# Patient Record
Sex: Male | Born: 1935 | Race: White | Hispanic: No | Marital: Married | Smoking: Former smoker
Health system: Southern US, Community
[De-identification: ages and names within clinical notes are randomized; demographics above are authoritative.]

## PROBLEM LIST (undated history)

## (undated) DIAGNOSIS — M549 Dorsalgia, unspecified: Secondary | ICD-10-CM

## (undated) DIAGNOSIS — R05 Cough: Secondary | ICD-10-CM

## (undated) DIAGNOSIS — K259 Gastric ulcer, unspecified as acute or chronic, without hemorrhage or perforation: Secondary | ICD-10-CM

## (undated) DIAGNOSIS — E785 Hyperlipidemia, unspecified: Secondary | ICD-10-CM

## (undated) DIAGNOSIS — R32 Unspecified urinary incontinence: Secondary | ICD-10-CM

## (undated) DIAGNOSIS — K5900 Constipation, unspecified: Secondary | ICD-10-CM

## (undated) DIAGNOSIS — R0602 Shortness of breath: Secondary | ICD-10-CM

## (undated) DIAGNOSIS — I10 Essential (primary) hypertension: Secondary | ICD-10-CM

## (undated) DIAGNOSIS — K219 Gastro-esophageal reflux disease without esophagitis: Secondary | ICD-10-CM

## (undated) DIAGNOSIS — G8929 Other chronic pain: Secondary | ICD-10-CM

## (undated) DIAGNOSIS — F0390 Unspecified dementia without behavioral disturbance: Secondary | ICD-10-CM

## (undated) DIAGNOSIS — E876 Hypokalemia: Secondary | ICD-10-CM

## (undated) DIAGNOSIS — K5792 Diverticulitis of intestine, part unspecified, without perforation or abscess without bleeding: Secondary | ICD-10-CM

## (undated) DIAGNOSIS — I2510 Atherosclerotic heart disease of native coronary artery without angina pectoris: Secondary | ICD-10-CM

## (undated) DIAGNOSIS — C801 Malignant (primary) neoplasm, unspecified: Secondary | ICD-10-CM

## (undated) DIAGNOSIS — N2889 Other specified disorders of kidney and ureter: Secondary | ICD-10-CM

## (undated) DIAGNOSIS — I4891 Unspecified atrial fibrillation: Secondary | ICD-10-CM

## (undated) DIAGNOSIS — M1990 Unspecified osteoarthritis, unspecified site: Secondary | ICD-10-CM

## (undated) DIAGNOSIS — R7301 Impaired fasting glucose: Secondary | ICD-10-CM

## (undated) DIAGNOSIS — N400 Enlarged prostate without lower urinary tract symptoms: Secondary | ICD-10-CM

## (undated) DIAGNOSIS — Z952 Presence of prosthetic heart valve: Secondary | ICD-10-CM

## (undated) DIAGNOSIS — N289 Disorder of kidney and ureter, unspecified: Secondary | ICD-10-CM

## (undated) HISTORY — DX: Hypokalemia: E87.6

## (undated) HISTORY — PX: OTHER SURGICAL HISTORY: SHX169

## (undated) HISTORY — PX: CORONARY ARTERY BYPASS GRAFT: SHX141

## (undated) HISTORY — DX: Unspecified atrial fibrillation: I48.91

## (undated) HISTORY — DX: Hyperlipidemia, unspecified: E78.5

## (undated) HISTORY — DX: Other specified disorders of kidney and ureter: N28.89

## (undated) HISTORY — PX: KNEE SURGERY: SHX244

## (undated) HISTORY — PX: RADIOFREQUENCY ABLATION KIDNEY: SHX2292

## (undated) HISTORY — DX: Impaired fasting glucose: R73.01

## (undated) HISTORY — DX: Unspecified urinary incontinence: R32

## (undated) HISTORY — DX: Disorder of kidney and ureter, unspecified: N28.9

## (undated) HISTORY — DX: Atherosclerotic heart disease of native coronary artery without angina pectoris: I25.10

## (undated) HISTORY — DX: Essential (primary) hypertension: I10

## (undated) HISTORY — DX: Diverticulitis of intestine, part unspecified, without perforation or abscess without bleeding: K57.92

---

## 2000-06-07 ENCOUNTER — Ambulatory Visit (HOSPITAL_COMMUNITY): Admission: RE | Admit: 2000-06-07 | Discharge: 2000-06-07 | Payer: Self-pay | Admitting: Gastroenterology

## 2000-06-07 ENCOUNTER — Encounter (INDEPENDENT_AMBULATORY_CARE_PROVIDER_SITE_OTHER): Payer: Self-pay | Admitting: Specialist

## 2002-06-04 ENCOUNTER — Ambulatory Visit (HOSPITAL_COMMUNITY): Admission: RE | Admit: 2002-06-04 | Discharge: 2002-06-04 | Payer: Self-pay | Admitting: Cardiovascular Disease

## 2002-06-12 ENCOUNTER — Encounter: Payer: Self-pay | Admitting: Thoracic Surgery (Cardiothoracic Vascular Surgery)

## 2002-06-16 ENCOUNTER — Encounter (INDEPENDENT_AMBULATORY_CARE_PROVIDER_SITE_OTHER): Payer: Self-pay | Admitting: *Deleted

## 2002-06-16 ENCOUNTER — Encounter: Payer: Self-pay | Admitting: Thoracic Surgery (Cardiothoracic Vascular Surgery)

## 2002-06-16 ENCOUNTER — Inpatient Hospital Stay (HOSPITAL_COMMUNITY)
Admission: RE | Admit: 2002-06-16 | Discharge: 2002-06-24 | Payer: Self-pay | Admitting: Thoracic Surgery (Cardiothoracic Vascular Surgery)

## 2002-06-16 HISTORY — PX: AORTIC VALVE REPLACEMENT: SHX41

## 2002-06-17 ENCOUNTER — Encounter: Payer: Self-pay | Admitting: Thoracic Surgery (Cardiothoracic Vascular Surgery)

## 2002-06-18 ENCOUNTER — Encounter: Payer: Self-pay | Admitting: Thoracic Surgery (Cardiothoracic Vascular Surgery)

## 2002-07-09 ENCOUNTER — Ambulatory Visit (HOSPITAL_COMMUNITY): Admission: RE | Admit: 2002-07-09 | Discharge: 2002-07-09 | Payer: Self-pay | Admitting: Cardiovascular Disease

## 2002-07-20 ENCOUNTER — Encounter (HOSPITAL_COMMUNITY): Admission: RE | Admit: 2002-07-20 | Discharge: 2002-10-18 | Payer: Self-pay | Admitting: Cardiovascular Disease

## 2002-10-19 ENCOUNTER — Encounter (HOSPITAL_COMMUNITY): Admission: RE | Admit: 2002-10-19 | Discharge: 2003-01-17 | Payer: Self-pay | Admitting: Cardiovascular Disease

## 2004-07-07 ENCOUNTER — Ambulatory Visit: Payer: Self-pay

## 2004-08-04 ENCOUNTER — Ambulatory Visit: Payer: Self-pay | Admitting: Internal Medicine

## 2004-08-14 ENCOUNTER — Ambulatory Visit: Payer: Self-pay | Admitting: Cardiovascular Disease

## 2004-09-01 ENCOUNTER — Ambulatory Visit: Payer: Self-pay | Admitting: Cardiology

## 2004-09-05 ENCOUNTER — Ambulatory Visit: Payer: Self-pay

## 2004-09-15 ENCOUNTER — Ambulatory Visit: Payer: Self-pay | Admitting: Cardiovascular Disease

## 2004-09-28 ENCOUNTER — Ambulatory Visit: Payer: Self-pay | Admitting: Cardiology

## 2004-10-16 ENCOUNTER — Ambulatory Visit: Payer: Self-pay | Admitting: *Deleted

## 2004-11-10 ENCOUNTER — Ambulatory Visit: Payer: Self-pay | Admitting: Cardiology

## 2004-12-06 ENCOUNTER — Ambulatory Visit: Payer: Self-pay | Admitting: Internal Medicine

## 2005-01-03 ENCOUNTER — Ambulatory Visit: Payer: Self-pay | Admitting: Cardiology

## 2005-02-02 ENCOUNTER — Ambulatory Visit: Payer: Self-pay | Admitting: Cardiology

## 2005-02-20 ENCOUNTER — Ambulatory Visit: Payer: Self-pay | Admitting: Cardiovascular Disease

## 2005-03-02 ENCOUNTER — Ambulatory Visit: Payer: Self-pay | Admitting: Cardiology

## 2005-03-30 ENCOUNTER — Ambulatory Visit: Payer: Self-pay | Admitting: Cardiology

## 2005-05-01 ENCOUNTER — Ambulatory Visit: Payer: Self-pay | Admitting: Cardiology

## 2005-05-29 ENCOUNTER — Ambulatory Visit: Payer: Self-pay | Admitting: Internal Medicine

## 2005-06-25 ENCOUNTER — Ambulatory Visit: Payer: Self-pay | Admitting: Internal Medicine

## 2005-07-03 ENCOUNTER — Ambulatory Visit: Payer: Self-pay | Admitting: Cardiology

## 2005-07-17 ENCOUNTER — Ambulatory Visit: Payer: Self-pay | Admitting: Cardiology

## 2005-08-07 ENCOUNTER — Ambulatory Visit: Payer: Self-pay | Admitting: Cardiology

## 2005-08-14 ENCOUNTER — Ambulatory Visit: Payer: Self-pay | Admitting: Cardiovascular Disease

## 2005-08-30 ENCOUNTER — Ambulatory Visit: Payer: Self-pay | Admitting: Cardiovascular Disease

## 2005-08-30 ENCOUNTER — Ambulatory Visit (HOSPITAL_COMMUNITY): Admission: RE | Admit: 2005-08-30 | Discharge: 2005-08-30 | Payer: Self-pay | Admitting: Cardiovascular Disease

## 2005-09-04 ENCOUNTER — Ambulatory Visit: Payer: Self-pay | Admitting: Cardiology

## 2005-10-02 ENCOUNTER — Ambulatory Visit: Payer: Self-pay | Admitting: Cardiology

## 2005-10-18 ENCOUNTER — Ambulatory Visit: Payer: Self-pay | Admitting: Cardiovascular Disease

## 2005-10-30 ENCOUNTER — Ambulatory Visit: Payer: Self-pay | Admitting: *Deleted

## 2005-11-19 ENCOUNTER — Ambulatory Visit: Payer: Self-pay | Admitting: Cardiology

## 2005-11-27 ENCOUNTER — Ambulatory Visit: Payer: Self-pay | Admitting: Cardiology

## 2005-12-17 ENCOUNTER — Ambulatory Visit: Payer: Self-pay | Admitting: Cardiovascular Disease

## 2005-12-25 ENCOUNTER — Ambulatory Visit: Payer: Self-pay | Admitting: Cardiology

## 2006-01-22 ENCOUNTER — Ambulatory Visit: Payer: Self-pay | Admitting: Internal Medicine

## 2006-02-19 ENCOUNTER — Ambulatory Visit: Payer: Self-pay | Admitting: Cardiovascular Disease

## 2006-03-19 ENCOUNTER — Ambulatory Visit: Payer: Self-pay | Admitting: *Deleted

## 2006-04-16 ENCOUNTER — Ambulatory Visit: Payer: Self-pay | Admitting: Cardiology

## 2006-05-14 ENCOUNTER — Ambulatory Visit: Payer: Self-pay | Admitting: Cardiology

## 2006-06-04 ENCOUNTER — Ambulatory Visit: Payer: Self-pay | Admitting: Cardiovascular Disease

## 2006-06-11 ENCOUNTER — Ambulatory Visit: Payer: Self-pay | Admitting: Cardiology

## 2006-07-05 ENCOUNTER — Ambulatory Visit: Payer: Self-pay | Admitting: Cardiovascular Disease

## 2006-07-09 ENCOUNTER — Ambulatory Visit: Payer: Self-pay | Admitting: *Deleted

## 2006-07-30 ENCOUNTER — Ambulatory Visit: Payer: Self-pay | Admitting: Cardiology

## 2006-08-26 ENCOUNTER — Ambulatory Visit: Payer: Self-pay | Admitting: Cardiovascular Disease

## 2006-08-28 ENCOUNTER — Ambulatory Visit: Payer: Self-pay | Admitting: *Deleted

## 2006-09-18 ENCOUNTER — Ambulatory Visit: Payer: Self-pay | Admitting: Cardiology

## 2006-10-08 ENCOUNTER — Ambulatory Visit: Payer: Self-pay | Admitting: Cardiovascular Disease

## 2006-10-14 ENCOUNTER — Ambulatory Visit: Payer: Self-pay | Admitting: Internal Medicine

## 2006-10-24 ENCOUNTER — Ambulatory Visit: Payer: Self-pay | Admitting: *Deleted

## 2006-11-05 ENCOUNTER — Ambulatory Visit: Payer: Self-pay | Admitting: Cardiovascular Disease

## 2006-11-07 ENCOUNTER — Ambulatory Visit: Payer: Self-pay | Admitting: Cardiology

## 2006-12-05 ENCOUNTER — Ambulatory Visit: Payer: Self-pay | Admitting: Cardiology

## 2006-12-19 ENCOUNTER — Ambulatory Visit (HOSPITAL_COMMUNITY): Admission: RE | Admit: 2006-12-19 | Discharge: 2006-12-19 | Payer: Self-pay | Admitting: Cardiovascular Disease

## 2006-12-19 ENCOUNTER — Ambulatory Visit: Payer: Self-pay | Admitting: Cardiology

## 2006-12-19 ENCOUNTER — Ambulatory Visit: Payer: Self-pay | Admitting: Cardiovascular Disease

## 2007-01-06 ENCOUNTER — Ambulatory Visit: Payer: Self-pay | Admitting: Internal Medicine

## 2007-01-13 ENCOUNTER — Ambulatory Visit: Payer: Self-pay | Admitting: Cardiovascular Disease

## 2007-02-03 ENCOUNTER — Ambulatory Visit: Payer: Self-pay | Admitting: Internal Medicine

## 2007-03-03 ENCOUNTER — Ambulatory Visit: Payer: Self-pay | Admitting: Cardiovascular Disease

## 2007-03-31 ENCOUNTER — Ambulatory Visit: Payer: Self-pay | Admitting: Internal Medicine

## 2007-04-11 ENCOUNTER — Inpatient Hospital Stay (HOSPITAL_COMMUNITY): Admission: EM | Admit: 2007-04-11 | Discharge: 2007-04-13 | Payer: Self-pay | Admitting: Podiatry

## 2007-04-15 ENCOUNTER — Ambulatory Visit: Payer: Self-pay | Admitting: Cardiology

## 2007-04-30 ENCOUNTER — Ambulatory Visit: Payer: Self-pay | Admitting: Internal Medicine

## 2007-05-20 ENCOUNTER — Ambulatory Visit: Payer: Self-pay | Admitting: Cardiology

## 2007-06-02 ENCOUNTER — Ambulatory Visit: Payer: Self-pay | Admitting: Internal Medicine

## 2007-06-30 ENCOUNTER — Ambulatory Visit: Payer: Self-pay | Admitting: Cardiovascular Disease

## 2007-07-07 ENCOUNTER — Ambulatory Visit: Payer: Self-pay | Admitting: Cardiovascular Disease

## 2007-07-29 ENCOUNTER — Ambulatory Visit: Payer: Self-pay | Admitting: Cardiology

## 2007-09-05 ENCOUNTER — Ambulatory Visit: Payer: Self-pay | Admitting: Cardiology

## 2007-09-19 ENCOUNTER — Ambulatory Visit: Payer: Self-pay | Admitting: Internal Medicine

## 2007-10-07 ENCOUNTER — Ambulatory Visit: Payer: Self-pay | Admitting: Cardiovascular Disease

## 2007-10-09 ENCOUNTER — Encounter: Payer: Self-pay | Admitting: Cardiovascular Disease

## 2007-10-09 ENCOUNTER — Ambulatory Visit: Payer: Self-pay

## 2007-10-17 ENCOUNTER — Ambulatory Visit: Payer: Self-pay | Admitting: Cardiology

## 2007-11-17 ENCOUNTER — Ambulatory Visit: Payer: Self-pay | Admitting: Cardiology

## 2007-12-15 ENCOUNTER — Ambulatory Visit: Payer: Self-pay | Admitting: Cardiology

## 2008-01-13 ENCOUNTER — Ambulatory Visit: Payer: Self-pay | Admitting: Cardiology

## 2008-02-03 ENCOUNTER — Ambulatory Visit: Payer: Self-pay | Admitting: Internal Medicine

## 2008-03-02 ENCOUNTER — Ambulatory Visit: Payer: Self-pay | Admitting: Cardiovascular Disease

## 2008-04-02 ENCOUNTER — Ambulatory Visit: Payer: Self-pay | Admitting: Cardiology

## 2008-04-14 ENCOUNTER — Ambulatory Visit: Payer: Self-pay | Admitting: Cardiovascular Disease

## 2008-04-30 ENCOUNTER — Ambulatory Visit: Payer: Self-pay | Admitting: Cardiovascular Disease

## 2008-05-28 ENCOUNTER — Ambulatory Visit: Payer: Self-pay | Admitting: Cardiovascular Disease

## 2008-06-25 ENCOUNTER — Ambulatory Visit: Payer: Self-pay | Admitting: Internal Medicine

## 2008-07-01 ENCOUNTER — Ambulatory Visit: Payer: Self-pay | Admitting: Cardiovascular Disease

## 2008-07-26 ENCOUNTER — Ambulatory Visit: Payer: Self-pay | Admitting: Cardiovascular Disease

## 2008-08-23 ENCOUNTER — Ambulatory Visit: Payer: Self-pay | Admitting: Internal Medicine

## 2008-08-23 ENCOUNTER — Inpatient Hospital Stay (HOSPITAL_COMMUNITY): Admission: EM | Admit: 2008-08-23 | Discharge: 2008-09-03 | Payer: Self-pay | Admitting: Emergency Medicine

## 2008-08-24 ENCOUNTER — Encounter (INDEPENDENT_AMBULATORY_CARE_PROVIDER_SITE_OTHER): Payer: Self-pay | Admitting: Internal Medicine

## 2008-08-30 ENCOUNTER — Encounter (INDEPENDENT_AMBULATORY_CARE_PROVIDER_SITE_OTHER): Payer: Self-pay | Admitting: Interventional Radiology

## 2008-09-02 ENCOUNTER — Ambulatory Visit: Payer: Self-pay | Admitting: Physical Medicine & Rehabilitation

## 2008-09-03 ENCOUNTER — Ambulatory Visit: Payer: Self-pay | Admitting: Physical Medicine & Rehabilitation

## 2008-09-03 ENCOUNTER — Inpatient Hospital Stay (HOSPITAL_COMMUNITY)
Admission: RE | Admit: 2008-09-03 | Discharge: 2008-09-08 | Payer: Self-pay | Admitting: Physical Medicine & Rehabilitation

## 2008-09-30 ENCOUNTER — Ambulatory Visit: Payer: Self-pay | Admitting: Internal Medicine

## 2008-10-11 ENCOUNTER — Ambulatory Visit: Payer: Self-pay | Admitting: Internal Medicine

## 2008-10-15 ENCOUNTER — Ambulatory Visit: Payer: Self-pay | Admitting: Cardiovascular Disease

## 2008-10-22 ENCOUNTER — Ambulatory Visit: Payer: Self-pay

## 2008-10-25 ENCOUNTER — Ambulatory Visit: Payer: Self-pay | Admitting: Cardiology

## 2008-11-22 ENCOUNTER — Ambulatory Visit: Payer: Self-pay | Admitting: Cardiology

## 2008-12-21 ENCOUNTER — Ambulatory Visit: Payer: Self-pay | Admitting: Cardiology

## 2009-01-03 ENCOUNTER — Inpatient Hospital Stay (HOSPITAL_COMMUNITY): Admission: EM | Admit: 2009-01-03 | Discharge: 2009-01-04 | Payer: Self-pay | Admitting: Emergency Medicine

## 2009-01-03 ENCOUNTER — Telehealth: Payer: Self-pay | Admitting: Cardiovascular Disease

## 2009-01-11 ENCOUNTER — Encounter: Payer: Self-pay | Admitting: Cardiovascular Disease

## 2009-01-12 ENCOUNTER — Ambulatory Visit: Payer: Self-pay | Admitting: Cardiology

## 2009-01-19 ENCOUNTER — Ambulatory Visit (HOSPITAL_COMMUNITY): Admission: RE | Admit: 2009-01-19 | Discharge: 2009-01-19 | Payer: Self-pay | Admitting: Urology

## 2009-01-21 DIAGNOSIS — Z952 Presence of prosthetic heart valve: Secondary | ICD-10-CM | POA: Insufficient documentation

## 2009-01-21 DIAGNOSIS — R4182 Altered mental status, unspecified: Secondary | ICD-10-CM | POA: Insufficient documentation

## 2009-01-21 DIAGNOSIS — E785 Hyperlipidemia, unspecified: Secondary | ICD-10-CM | POA: Insufficient documentation

## 2009-01-21 DIAGNOSIS — I4891 Unspecified atrial fibrillation: Secondary | ICD-10-CM

## 2009-01-21 DIAGNOSIS — I10 Essential (primary) hypertension: Secondary | ICD-10-CM

## 2009-01-21 DIAGNOSIS — E876 Hypokalemia: Secondary | ICD-10-CM

## 2009-01-21 DIAGNOSIS — I2510 Atherosclerotic heart disease of native coronary artery without angina pectoris: Secondary | ICD-10-CM | POA: Insufficient documentation

## 2009-01-26 ENCOUNTER — Encounter: Payer: Self-pay | Admitting: *Deleted

## 2009-01-26 ENCOUNTER — Ambulatory Visit: Payer: Self-pay | Admitting: Cardiovascular Disease

## 2009-01-27 ENCOUNTER — Ambulatory Visit: Payer: Self-pay | Admitting: Internal Medicine

## 2009-01-27 LAB — CONVERTED CEMR LAB
POC INR: 3.1
Protime: 21.1

## 2009-02-24 ENCOUNTER — Ambulatory Visit: Payer: Self-pay | Admitting: Cardiology

## 2009-02-24 LAB — CONVERTED CEMR LAB
POC INR: 2
Prothrombin Time: 17.5 s

## 2009-03-02 ENCOUNTER — Encounter: Payer: Self-pay | Admitting: *Deleted

## 2009-03-02 ENCOUNTER — Encounter: Admission: RE | Admit: 2009-03-02 | Discharge: 2009-03-02 | Payer: Self-pay | Admitting: Urology

## 2009-03-04 ENCOUNTER — Telehealth (INDEPENDENT_AMBULATORY_CARE_PROVIDER_SITE_OTHER): Payer: Self-pay | Admitting: *Deleted

## 2009-03-10 ENCOUNTER — Encounter: Admission: RE | Admit: 2009-03-10 | Discharge: 2009-03-10 | Payer: Self-pay | Admitting: Orthopedic Surgery

## 2009-03-11 ENCOUNTER — Telehealth: Payer: Self-pay | Admitting: Cardiovascular Disease

## 2009-03-23 ENCOUNTER — Encounter: Payer: Self-pay | Admitting: Cardiovascular Disease

## 2009-03-24 ENCOUNTER — Ambulatory Visit: Payer: Self-pay | Admitting: Cardiovascular Disease

## 2009-03-24 LAB — CONVERTED CEMR LAB: Prothrombin Time: 15.3 s

## 2009-03-25 ENCOUNTER — Telehealth: Payer: Self-pay | Admitting: Cardiovascular Disease

## 2009-03-31 ENCOUNTER — Encounter (INDEPENDENT_AMBULATORY_CARE_PROVIDER_SITE_OTHER): Payer: Self-pay | Admitting: Cardiology

## 2009-03-31 ENCOUNTER — Ambulatory Visit: Payer: Self-pay | Admitting: Cardiology

## 2009-04-04 ENCOUNTER — Ambulatory Visit (HOSPITAL_COMMUNITY): Admission: RE | Admit: 2009-04-04 | Discharge: 2009-04-05 | Payer: Self-pay | Admitting: Interventional Radiology

## 2009-04-04 ENCOUNTER — Encounter (INDEPENDENT_AMBULATORY_CARE_PROVIDER_SITE_OTHER): Payer: Self-pay | Admitting: Interventional Radiology

## 2009-04-05 ENCOUNTER — Telehealth (INDEPENDENT_AMBULATORY_CARE_PROVIDER_SITE_OTHER): Payer: Self-pay | Admitting: Cardiology

## 2009-04-11 ENCOUNTER — Telehealth: Payer: Self-pay | Admitting: Cardiovascular Disease

## 2009-04-14 ENCOUNTER — Encounter (INDEPENDENT_AMBULATORY_CARE_PROVIDER_SITE_OTHER): Payer: Self-pay | Admitting: Cardiology

## 2009-04-14 ENCOUNTER — Ambulatory Visit: Payer: Self-pay | Admitting: Cardiology

## 2009-04-14 LAB — CONVERTED CEMR LAB: POC INR: 1

## 2009-04-18 ENCOUNTER — Ambulatory Visit: Payer: Self-pay | Admitting: Cardiology

## 2009-04-18 ENCOUNTER — Encounter (INDEPENDENT_AMBULATORY_CARE_PROVIDER_SITE_OTHER): Payer: Self-pay | Admitting: Cardiology

## 2009-04-25 ENCOUNTER — Ambulatory Visit: Payer: Self-pay | Admitting: Cardiovascular Disease

## 2009-04-25 ENCOUNTER — Telehealth (INDEPENDENT_AMBULATORY_CARE_PROVIDER_SITE_OTHER): Payer: Self-pay | Admitting: *Deleted

## 2009-05-03 ENCOUNTER — Encounter: Admission: RE | Admit: 2009-05-03 | Discharge: 2009-05-03 | Payer: Self-pay | Admitting: Interventional Radiology

## 2009-05-03 ENCOUNTER — Encounter: Payer: Self-pay | Admitting: Cardiovascular Disease

## 2009-05-04 ENCOUNTER — Ambulatory Visit: Payer: Self-pay | Admitting: Cardiology

## 2009-05-04 LAB — CONVERTED CEMR LAB: POC INR: 1.9

## 2009-05-19 ENCOUNTER — Ambulatory Visit: Payer: Self-pay | Admitting: Internal Medicine

## 2009-05-19 LAB — CONVERTED CEMR LAB: POC INR: 2.1

## 2009-06-16 ENCOUNTER — Ambulatory Visit: Payer: Self-pay | Admitting: Cardiovascular Disease

## 2009-06-16 LAB — CONVERTED CEMR LAB
INR: 1.6
POC INR: 1.6

## 2009-06-30 ENCOUNTER — Ambulatory Visit: Payer: Self-pay | Admitting: Internal Medicine

## 2009-06-30 LAB — CONVERTED CEMR LAB: POC INR: 1.8

## 2009-07-14 ENCOUNTER — Ambulatory Visit: Payer: Self-pay | Admitting: Cardiology

## 2009-08-05 ENCOUNTER — Ambulatory Visit: Payer: Self-pay | Admitting: Internal Medicine

## 2009-08-05 ENCOUNTER — Ambulatory Visit: Payer: Self-pay | Admitting: Cardiovascular Disease

## 2009-09-02 ENCOUNTER — Ambulatory Visit: Payer: Self-pay | Admitting: Cardiology

## 2009-09-02 LAB — CONVERTED CEMR LAB: POC INR: 2.7

## 2009-09-30 ENCOUNTER — Ambulatory Visit: Payer: Self-pay | Admitting: Internal Medicine

## 2009-09-30 LAB — CONVERTED CEMR LAB: POC INR: 2.5

## 2009-10-28 ENCOUNTER — Ambulatory Visit: Payer: Self-pay | Admitting: Cardiology

## 2009-11-01 ENCOUNTER — Encounter: Admission: RE | Admit: 2009-11-01 | Discharge: 2009-11-01 | Payer: Self-pay | Admitting: Interventional Radiology

## 2009-11-01 ENCOUNTER — Ambulatory Visit (HOSPITAL_COMMUNITY): Admission: RE | Admit: 2009-11-01 | Discharge: 2009-11-01 | Payer: Self-pay | Admitting: Interventional Radiology

## 2009-12-02 ENCOUNTER — Ambulatory Visit: Payer: Self-pay | Admitting: Cardiology

## 2009-12-30 ENCOUNTER — Ambulatory Visit: Payer: Self-pay | Admitting: Internal Medicine

## 2009-12-30 LAB — CONVERTED CEMR LAB: POC INR: 2.1

## 2010-01-27 ENCOUNTER — Encounter: Payer: Self-pay | Admitting: Cardiovascular Disease

## 2010-01-27 ENCOUNTER — Ambulatory Visit (HOSPITAL_COMMUNITY): Admission: RE | Admit: 2010-01-27 | Discharge: 2010-01-27 | Payer: Self-pay | Admitting: Interventional Radiology

## 2010-01-30 ENCOUNTER — Ambulatory Visit: Payer: Self-pay | Admitting: Cardiology

## 2010-01-30 LAB — CONVERTED CEMR LAB: POC INR: 1.5

## 2010-02-21 ENCOUNTER — Ambulatory Visit: Payer: Self-pay | Admitting: Internal Medicine

## 2010-03-15 ENCOUNTER — Ambulatory Visit: Payer: Self-pay | Admitting: Cardiology

## 2010-04-05 ENCOUNTER — Ambulatory Visit: Payer: Self-pay | Admitting: Cardiology

## 2010-04-05 LAB — CONVERTED CEMR LAB: POC INR: 2.2

## 2010-04-12 ENCOUNTER — Encounter: Admission: RE | Admit: 2010-04-12 | Discharge: 2010-04-12 | Payer: Self-pay | Admitting: Interventional Radiology

## 2010-04-12 ENCOUNTER — Ambulatory Visit (HOSPITAL_COMMUNITY): Admission: RE | Admit: 2010-04-12 | Discharge: 2010-04-12 | Payer: Self-pay | Admitting: Interventional Radiology

## 2010-04-26 ENCOUNTER — Ambulatory Visit: Payer: Self-pay | Admitting: Cardiovascular Disease

## 2010-04-26 DIAGNOSIS — R0602 Shortness of breath: Secondary | ICD-10-CM | POA: Insufficient documentation

## 2010-04-26 DIAGNOSIS — R079 Chest pain, unspecified: Secondary | ICD-10-CM

## 2010-04-26 DIAGNOSIS — R609 Edema, unspecified: Secondary | ICD-10-CM | POA: Insufficient documentation

## 2010-05-02 ENCOUNTER — Ambulatory Visit: Payer: Self-pay | Admitting: Cardiovascular Disease

## 2010-05-02 LAB — CONVERTED CEMR LAB: POC INR: 2.3

## 2010-05-16 ENCOUNTER — Telehealth (INDEPENDENT_AMBULATORY_CARE_PROVIDER_SITE_OTHER): Payer: Self-pay | Admitting: *Deleted

## 2010-05-17 ENCOUNTER — Encounter: Payer: Self-pay | Admitting: Cardiology

## 2010-05-17 ENCOUNTER — Ambulatory Visit: Payer: Self-pay | Admitting: Cardiology

## 2010-05-17 ENCOUNTER — Ambulatory Visit (HOSPITAL_COMMUNITY): Admission: RE | Admit: 2010-05-17 | Discharge: 2010-05-17 | Payer: Self-pay | Admitting: Cardiovascular Disease

## 2010-05-17 ENCOUNTER — Encounter (HOSPITAL_COMMUNITY)
Admission: RE | Admit: 2010-05-17 | Discharge: 2010-07-28 | Payer: Self-pay | Source: Home / Self Care | Admitting: Cardiovascular Disease

## 2010-05-17 ENCOUNTER — Encounter: Payer: Self-pay | Admitting: Cardiovascular Disease

## 2010-05-17 ENCOUNTER — Encounter (INDEPENDENT_AMBULATORY_CARE_PROVIDER_SITE_OTHER): Payer: Self-pay

## 2010-05-17 ENCOUNTER — Ambulatory Visit: Payer: Self-pay

## 2010-05-24 ENCOUNTER — Ambulatory Visit: Payer: Self-pay | Admitting: Internal Medicine

## 2010-05-29 ENCOUNTER — Ambulatory Visit: Payer: Self-pay | Admitting: Internal Medicine

## 2010-05-29 LAB — CONVERTED CEMR LAB: POC INR: 2.5

## 2010-06-05 ENCOUNTER — Ambulatory Visit: Payer: Self-pay | Admitting: Cardiology

## 2010-06-05 LAB — CONVERTED CEMR LAB: POC INR: 2.7

## 2010-06-12 ENCOUNTER — Ambulatory Visit: Payer: Self-pay | Admitting: Cardiology

## 2010-06-12 ENCOUNTER — Encounter: Payer: Self-pay | Admitting: Internal Medicine

## 2010-06-12 LAB — CONVERTED CEMR LAB
Basophils Absolute: 0 10*3/uL (ref 0.0–0.1)
Eosinophils Absolute: 0.2 10*3/uL (ref 0.0–0.7)
Eosinophils Relative: 2.4 % (ref 0.0–5.0)
GFR calc non Af Amer: 72.51 mL/min (ref >60)
INR: 3 — ABNORMAL HIGH (ref 0.8–1.0)
MCHC: 34.1 g/dL (ref 30.0–36.0)
MCV: 88.8 fL (ref 78.0–100.0)
Magnesium: 2 mg/dL (ref 1.5–2.5)
Monocytes Absolute: 0.5 10*3/uL (ref 0.1–1.0)
Neutrophils Relative %: 59.1 % (ref 43.0–77.0)
Platelets: 199 10*3/uL (ref 150.0–400.0)
Potassium: 4.2 meq/L (ref 3.5–5.1)
Prothrombin Time: 31.9 s — ABNORMAL HIGH (ref 9.7–11.8)
Sodium: 140 meq/L (ref 135–145)
WBC: 7.1 10*3/uL (ref 4.5–10.5)
aPTT: 35.8 s — ABNORMAL HIGH (ref 21.7–28.8)

## 2010-06-14 ENCOUNTER — Inpatient Hospital Stay (HOSPITAL_COMMUNITY): Admission: AD | Admit: 2010-06-14 | Discharge: 2010-06-18 | Payer: Self-pay | Admitting: Internal Medicine

## 2010-06-14 ENCOUNTER — Ambulatory Visit: Payer: Self-pay | Admitting: Internal Medicine

## 2010-06-22 ENCOUNTER — Telehealth: Payer: Self-pay | Admitting: Internal Medicine

## 2010-06-23 ENCOUNTER — Ambulatory Visit: Payer: Self-pay | Admitting: Cardiovascular Disease

## 2010-06-28 ENCOUNTER — Ambulatory Visit: Payer: Self-pay | Admitting: Internal Medicine

## 2010-06-28 ENCOUNTER — Ambulatory Visit: Payer: Self-pay | Admitting: Cardiology

## 2010-06-30 ENCOUNTER — Telehealth: Payer: Self-pay | Admitting: Internal Medicine

## 2010-08-01 ENCOUNTER — Ambulatory Visit: Payer: Self-pay | Admitting: Cardiovascular Disease

## 2010-08-01 ENCOUNTER — Ambulatory Visit: Payer: Self-pay | Admitting: Internal Medicine

## 2010-08-27 DIAGNOSIS — R7301 Impaired fasting glucose: Secondary | ICD-10-CM

## 2010-08-27 HISTORY — DX: Impaired fasting glucose: R73.01

## 2010-08-29 ENCOUNTER — Ambulatory Visit: Admission: RE | Admit: 2010-08-29 | Discharge: 2010-08-29 | Payer: Self-pay | Source: Home / Self Care

## 2010-09-11 ENCOUNTER — Telehealth (INDEPENDENT_AMBULATORY_CARE_PROVIDER_SITE_OTHER): Payer: Self-pay | Admitting: *Deleted

## 2010-09-17 ENCOUNTER — Encounter: Payer: Self-pay | Admitting: Cardiovascular Disease

## 2010-09-18 ENCOUNTER — Encounter: Payer: Self-pay | Admitting: Urology

## 2010-09-24 LAB — CONVERTED CEMR LAB
Basophils Relative: 0.3 % (ref 0.0–3.0)
Chloride: 107 meq/L (ref 96–112)
Eosinophils Relative: 2.9 % (ref 0.0–5.0)
HCT: 39.8 % (ref 39.0–52.0)
Hemoglobin: 13.7 g/dL (ref 13.0–17.0)
INR: 1.9 — ABNORMAL HIGH (ref 0.8–1.0)
MCV: 88.9 fL (ref 78.0–100.0)
Monocytes Absolute: 0.2 10*3/uL (ref 0.1–1.0)
Neutrophils Relative %: 59.1 % (ref 43.0–77.0)
Potassium: 3.9 meq/L (ref 3.5–5.1)
RBC: 4.48 M/uL (ref 4.22–5.81)
Sodium: 139 meq/L (ref 135–145)
WBC: 6.2 10*3/uL (ref 4.5–10.5)

## 2010-09-26 ENCOUNTER — Ambulatory Visit: Admission: RE | Admit: 2010-09-26 | Discharge: 2010-09-26 | Payer: Self-pay | Source: Home / Self Care

## 2010-09-26 LAB — CONVERTED CEMR LAB: POC INR: 2

## 2010-09-26 NOTE — Progress Notes (Signed)
Summary: talk to nurse  Phone Note Call from Patient   Caller: Daughter Summary of Call: pt daughter needs to speak with nurse concerning her fathers medications. Pharamacy told her that there was going to be an interaction with the meds. Pt is not taking any of his meds because of this. [phone] Initial call taken by: Edman Circle,  June 30, 2010 9:16 AM  Follow-up for Phone Call        pt dtr calling back-got disconnected-pls call [phone] Glynda Jaeger  June 30, 2010 9:55 AM  spoke w/daughter she reports pharmacy alerted them to interaction b/t torsemide and digoxin and they were nervous to take it, discussed w/Sarah phD and explained can increase dig level so we will just have to check that ina month or two but we use these meds together frequently, have called [facility] and made them aware ok to fill med, they ask if they can change his torsemide 20mg  to 10mg  2 tabs daily b/c it would save him a lot of money advised that was ok just make sure pt knows to take 2 tabs, pt has f/u appt w/Dr Ladona Ridgel in Dec. Meredith Staggers, RN  June 30, 2010 9:59 AM     New/Updated Medications: TORSEMIDE 10 MG TABS (TORSEMIDE) Take 2 tabs daily

## 2010-09-26 NOTE — Assessment & Plan Note (Signed)
Summary: Cardiology Nuclear Testing  Nuclear Med Background Indications for Stress Test: Evaluation for Ischemia, Graft Patency   History: CABG, Echo, Heart Catheterization, Myocardial Perfusion Study  History Comments: 03 CABG/AVR 09 Echo-EF-NL 2/10 MPS-Inf thinning with EF-NL PAF  Symptoms: DOE, Fatigue, Fatigue with Exertion, Light-Headedness, SOB, Vomiting    Nuclear Pre-Procedure Cardiac Risk Factors: Family History - CAD, History of Smoking, Hypertension, Lipids Caffeine/Decaff Intake: none NPO After: 6:00 PM Lungs: Clear IV 0.9% NS with Angio Cath: 20g     IV Site: (R) AC IV Started by: Stanton Kidney, EMT-P Chest Size (in) 52     Height (in): 76 Weight (lb): 288 BMI: 35.18 Tech Comments: Carvedilol taken 8am this day. Discussed with Dr. Ephraim Hamburger the patient having HR to 153 after lexiscan( AFIB with RVR). The patient to increase the carvedilol to 3.125mg  2 tablets two times a day per Dr. Ephraim Hamburger.The patient has a follow-up office visit with Dr. Rosette Reveal 05/24/10 at 2:30p. Patsy Edwards,RN.  Nuclear Med Study 1 or 2 day study:  1 day     Stress Test Type:  Lexiscan Low level Reading MD:  Marca Ancona, MD     Referring MD:  Charlton Haws MD Resting Radionuclide:  Technetium 60m Tetrofosmin     Resting Radionuclide Dose:  11 mCi  Stress Radionuclide:  Technetium 16m Tetrofosmin     Stress Radionuclide Dose:  33 mCi   Stress Protocol      Max HR:  153 bpm     Predicted Max HR:  146 bpm  Max Systolic BP: 168 mm Hg     Percent Max HR:  104.79 %Rate Pressure Product:  [address]  Lexiscan: 0.4 mg   Stress Test Technologist:  Irean Hong,  RN     Nuclear Technologist:  Doyne Keel, CNMT  Rest Procedure  Myocardial perfusion imaging was performed at rest 45 minutes following the intravenous administration of Technetium 36m Tetrofosmin.  Stress Procedure  The patient received IV Lexiscan 0.4 mg over 15-seconds with concurrent low level exercise and then Technetium 19m  Tetrofosmin was injected at 30-seconds while the patient continued walking one more minute.The patient had baseline AFIB with a RVR after lexiscan with rate to 153 on carvedilol.  There were nonspecific changes with Lexiscan.   Quantitative spect images were obtained after a 45 minute delay.  QPS Raw Data Images:  Mild diaphragmatic attenuation.  Normal left ventricular size. Stress Images:  Mild basal to mid inferior perfusion defect.  Rest Images:  Mild basal to mid inferior perfusion defect.  Subtraction (SDS):  Mild fixed basal to mid inferior perfusion defect.  Transient Ischemic Dilatation:  .98  (Normal <1.22)  Lung/Heart Ratio:  .35  (Normal <0.45)  Quantitative Gated Spect Images QGS cine images:  non-gated study   Overall Impression  Exercise Capacity: Lexiscan with low level exercise. BP Response: Normal blood pressure response. Clinical Symptoms: DIzzy ECG Impression: Atrial fibrillation.  No ischemic ECG changes with stress.  Overall Impression: Mild fixed basal to mid inferior perfusion defect, representing diaphragmatic attenuation versus prior infarct.  No ischemia.  Overall Impression Comments: Low risk study.   Appended Document: Cardiology Nuclear Testing nonischemic myovue  Continue meds  Appended Document: Cardiology Nuclear Testing pt notified.

## 2010-09-26 NOTE — Medication Information (Signed)
Summary: rov/tm  Anticoagulant Therapy  Managed by: Reina Fuse, PharmD Referring MD: Charlton Haws MD Supervising MD: Jens Som MD, Arlys John Indication 1: Aortic Valve Disorder (ICD-424.1) Lab Used: [facility] [place] Site: [address] INR POC 2.2 INR RANGE 2 - 3  Dietary changes: no    Health status changes: no    Bleeding/hemorrhagic complications: no    Recent/future hospitalizations: no    Any changes in medication regimen? no    Recent/future dental: no  Any missed doses?: no       Is patient compliant with meds? yes      Comments: Has been taking an OTC cough medicine the past 5 days (cannot remember name).  Current Medications (verified): 1)  Amlodipine Besylate 10 Mg Tabs (Amlodipine Besylate) .[name].. 1 Tab By Mouth Once Daily 2)  Lipitor 40 Mg Tabs (Atorvastatin Calcium) .... Take 1 Tablet Once A Day 3)  Nitroglycerin 0.4 Mg Subl (Nitroglycerin) 4)  Ramipril 10 Mg Caps (Ramipril) .[name].. 1 Tab By Mouth Two Times A Day 5)  Hydrochlorothiazide 25 Mg Tabs (Hydrochlorothiazide) .... Take One Tablet Once Daily 6)  Sotalol Hcl 80 Mg Tabs (Sotalol Hcl) .... Take One Tablet Every 12 Hours 7)  Klor-Con 8 Meq Cr-Tabs (Potassium Chloride) .[name].. 1 Tablet Once Daily 8)  Metoclopramide Hcl 10 Mg Tabs (Metoclopramide Hcl) .... Two Times A Day 9)  Tylenol Extra Strength 500 Mg Tabs (Acetaminophen) .... Prn 10)  Warfarin Sodium 5 Mg Tabs (Warfarin Sodium) .... Use As Directed By Anticoagulation Clinic 11)  Flomax 0.4 Mg Caps (Tamsulosin Hcl) .... Take 1 Capsule Daily  Allergies (verified): No Known Drug Allergies  Anticoagulation Management History:      The patient is taking warfarin and comes in today for a routine follow up visit.  Positive risk factors for bleeding include an age of 10 years or older.  The bleeding index is 'intermediate risk'.  Positive CHADS2 values include History of HTN.  Negative CHADS2 values include Age > 55 years old.  The start date was  06/26/2002.  His last INR was 1.6.  Anticoagulation responsible provider: Jens Som MD, Arlys John.  INR POC: 2.2.  Cuvette Lot#: [id number].  Exp: 05/2011.    Anticoagulation Management Assessment/Plan:      The patient's current anticoagulation dose is Warfarin sodium 5 mg tabs: Use as directed by Anticoagulation Clinic.  The target INR is 2 - 3.  The next INR is due 05/02/2010.  Anticoagulation instructions were given to patient.  Results were reviewed/authorized by Reina Fuse, PharmD.  He was notified by Reina Fuse, PharmD.         Prior Anticoagulation Instructions: INR 1.8 Today take 10mg s then change dose to 10mg s everyday except 7.5mg s on Mondays, Wednesdays and Fridays. Recheck in 3 weeks per Pharm D.   Current Anticoagulation Instructions: INR 2.2  Continue taking Coumadin 2 tabs (10 mg) on Sun, Tues, Thur, Sat and Coumadin 1.5 tabs (7.5 mg) on Mon, Wed, Fri. Return to clinic in 4 weeks.

## 2010-09-26 NOTE — Progress Notes (Signed)
Summary: nuc pre procedure  Phone Note Outgoing Call Call back at Home Phone [phone]   Call placed by: Cathlyn Parsons RN,  May 16, 2010 3:21 PM Call placed to: Patient Reason for Call: Confirm/change Appt Summary of Call: Reviewed information on My[employer] Information Sheet (see scanned document for further details).  Spoke with patient.     Nuclear Med Background Indications for Stress Test: Evaluation for Ischemia, Graft Patency   History: CABG, Echo, Myocardial Perfusion Study  History Comments: 03 CABG/AVR 09 Echo-EF-NL 2/10 MPS-Inf thinning with EF-NL PAF  Symptoms: Fatigue, SOB    Nuclear Pre-Procedure Cardiac Risk Factors: Family History - CAD, History of Smoking, Hypertension, Lipids Height (in): 76  Nuclear Med Study Referring MD:  Charlton Haws MD

## 2010-09-26 NOTE — Assessment & Plan Note (Signed)
Summary: F/U TIKOSYN LOAD/LG   Visit Type:  Follow-up   History of Present Illness: [name] returns today for followup of atrial fibrillation.  The patient underwent CABG/AVR many years ago.  He has had increasingly frequent episodes of atrial fib and is now persistent.  We had him admitted for Tikosyn loading and he went back to NSR but then back to atrial fib.  His QT was long and we ultimately decided to stop his Tikosyn and increase his  carvedilol.  He feels less sob and fatigued.  He has not had frank syncope. He does note some peripheral edema.   Current Medications (verified): 1)  Lipitor 40 Mg Tabs (Atorvastatin Calcium) .... Take 1 Tablet Once A Day 2)  Nitroglycerin 0.4 Mg Subl (Nitroglycerin) .... As Needed 3)  Ramipril 10 Mg Caps (Ramipril) .[name].. 1 Tab By Mouth Two Times A Day 4)  Demadex 20 Mg Tabs (Torsemide) .... Take One Tab By Mouth Once Daily 5)  Klor-Con 8 Meq Cr-Tabs (Potassium Chloride) .[name].. 1 Tablet Once Daily 6)  Metoclopramide Hcl 10 Mg Tabs (Metoclopramide Hcl) .... Two Times A Day 7)  Tylenol Extra Strength 500 Mg Tabs (Acetaminophen) .... Prn 8)  Warfarin Sodium 5 Mg Tabs (Warfarin Sodium) .... Use As Directed By Anticoagulation Clinic 9)  Flomax 0.4 Mg Caps (Tamsulosin Hcl) .... Take 1 Capsule Daily 10)  Fish Oil   Oil (Fish Oil) .[name].. 1 Tab By Mouth Once Daily 11)  Multivitamins   Tabs (Multiple Vitamin) .[name].. 1 Tab By Mouth Once Daily 12)  Vitamin B12 .[name].. 1 Tab By Mouth Once Daily 13)  Carvedilol 6.25 Mg Tabs (Carvedilol) .... Take 2  Tablet By Mouth Twice A Day 14)  Allegra Allergy 180 Mg Tabs (Fexofenadine Hcl) .... As Needed 15)  Calcium Antacid 500 Mg Chew (Calcium Carbonate Antacid) .... Uad  Allergies (verified): No Known Drug Allergies  Past History:  Past Medical History: Last updated: 01/21/2009 ALTERED MENTAL STATUS (ICD-780.97) HYPOKALEMIA (ICD-276.8) ENCOUNTER FOR LONG-TERM USE OF ANTICOAGULANTS (ICD-V58.61) HYPERLIPIDEMIA  (ICD-272.4) ATRIAL FIBRILLATION (ICD-427.31) CAD (ICD-414.00) AORTIC VALVE REPLACEMENT, HX OF (ICD-V43.3) HYPERTENSION (ICD-401.9)  Past Surgical History: Last updated: 01/21/2009 CABG/AVR:  2003 Thoracic T12 compression fracture  Review of Systems  The patient denies chest pain, syncope, dyspnea on exertion, and peripheral edema.    Vital Signs:  Patient profile:   77 year old male Height:      76 inches Weight:      296 pounds BMI:     36.16 Pulse rate:   74 / minute BP sitting:   130 / 72  (left arm)  Vitals Entered By: Laurance Flatten CMA (June 28, 2010 3:09 PM)  Physical Exam  General:  Affect appropriate Healthy:  appears stated age HEENT: normal Neck supple with no adenopathy JVP normal no bruits no thyromegaly Lungs clear with no wheezing and good diaphragmatic motion Heart:  S1/S2 systolic murmur,rub, gallop or click PMI normal. IRIR Abdomen: benighn, BS positve, no tenderness, no AAA no bruit.  No HSM or HJR Distal pulses intact with no bruits Plus one bilateral edema Neuro non-focal Skin warm and dry    EKG  Procedure date:  06/28/2010  Findings:      Atrial fibrillation with a controlled ventricular response rate of: 74.Non-specific ST-T wave changes noted.    Impression & Recommendations:  Problem # 1:  ATRIAL FIBRILLATION (ICD-427.31) His symptoms are improved with rate control.  I discussed the treatment options with the patient and his family.  We discussed amiodarone vs a trial of additional rate control by adding digoxin to his coreg.  I will see him back in several weeks. His updated medication list for this problem includes:    Warfarin Sodium 5 Mg Tabs (Warfarin sodium) ..... Use as directed by anticoagulation clinic    Carvedilol 6.25 Mg Tabs (Carvedilol) .[name]... Take 2  tablet by mouth twice a day    Digoxin 0.125 Mg Tabs (Digoxin) .[name]... Take one tablet by mouth daily  Problem # 2:  CAD (ICD-414.00) He denies anginal symptoms.  I will  have him continue his current meds. His updated medication list for this problem includes:    Nitroglycerin 0.4 Mg Subl (Nitroglycerin) .[name]... As needed    Ramipril 10 Mg Caps (Ramipril) .[name]... 1 tab by mouth two times a day    Warfarin Sodium 5 Mg Tabs (Warfarin sodium) ..... Use as directed by anticoagulation clinic    Carvedilol 6.25 Mg Tabs (Carvedilol) .[name]... Take 2  tablet by mouth twice a day  Problem # 3:  HYPERTENSION (ICD-401.9) His blood pressure is still not optimally controlled.  He clearly abuses sodium and I spent considerable time today discussing the importance of salt restriction as it is affecting his blood pressure and his CHF symptoms. His updated medication list for this problem includes:    Ramipril 10 Mg Caps (Ramipril) .[name]... 1 tab by mouth two times a day    Demadex 20 Mg Tabs (Torsemide) .[name]... Take one tab by mouth once daily    Carvedilol 6.25 Mg Tabs (Carvedilol) .[name]... Take 2  tablet by mouth twice a day  Patient Instructions: 1)  Your physician recommends that you schedule a follow-up appointment in: 4-5 weeks. 2)  Your physician has recommended you make the following change in your medication: Start Digoxin 0.125mg  once daily. Prescriptions: DIGOXIN 0.125 MG TABS (DIGOXIN) Take one tablet by mouth daily  #30 x 11   Entered by:   Laurance Flatten CMA   Authorized by:   Laren Boom, MD, [facility]   Signed by:   Laurance Flatten CMA on 06/28/2010   Method used:   Electronically to        [facility] (retail)       [address]       [place], [place]  [address]       Ph: [phone] or [phone]       Fax: [phone]   RxID:   [id number]

## 2010-09-26 NOTE — Miscellaneous (Signed)
Summary: Increase dose of carvedilol  Clinical Lists Changes    Discussed with Dr. Ephraim Hamburger the patient having HR to 153 after lexiscan( AFIB with RVR).The patient is taking carvedilol 3.125mg  one tablet two times a day.The patient took the carvedilol at 8:00 am today. The patient to increase the carvedilol to 3.125mg  2 tablets two times a day per Dr. Ephraim Hamburger.The patient has a follow-up office visit with Dr. Rosette Reveal 05/24/10 at 2:30p. Tavari Loadholt,RN.

## 2010-09-26 NOTE — Medication Information (Signed)
Summary: rov/sl  Anticoagulant Therapy  Managed by: Reina Fuse, PharmD Referring MD: Charlton Haws MD Supervising MD: [name] MD,Peter Indication 1: Aortic Valve Disorder (ICD-424.1) Lab Used: [facility] [place] Site: [address] INR POC 2.3 INR RANGE 2 - 3  Dietary changes: no    Health status changes: no    Bleeding/hemorrhagic complications: no    Recent/future hospitalizations: no    Any changes in medication regimen? yes       Details: stopped HCTZ, amlodipine, sotalol. Started carvedilol and furosemide  Recent/future dental: no  Any missed doses?: no       Is patient compliant with meds? yes       Allergies: No Known Drug Allergies  Anticoagulation Management History:      The patient is taking warfarin and comes in today for a routine follow up visit.  Positive risk factors for bleeding include an age of 92 years or older.  The bleeding index is 'intermediate risk'.  Positive CHADS2 values include History of HTN.  Negative CHADS2 values include Age > 76 years old.  The start date was 06/26/2002.  His last INR was 1.6.  Anticoagulation responsible provider: [name] MD,Peter.  INR POC: 2.3.  Cuvette Lot#: [id number].  Exp: 05/2011.    Anticoagulation Management Assessment/Plan:      The patient's current anticoagulation dose is Warfarin sodium 5 mg tabs: Use as directed by Anticoagulation Clinic.  The target INR is 2 - 3.  The next INR is due 05/30/2010.  Anticoagulation instructions were given to patient.  Results were reviewed/authorized by Reina Fuse, PharmD.  He was notified by Cloyde Reams RN.         Prior Anticoagulation Instructions: INR 2.2  Continue taking Coumadin 2 tabs (10 mg) on Sun, Tues, Thur, Sat and Coumadin 1.5 tabs (7.5 mg) on Mon, Wed, Fri. Return to clinic in 4 weeks.     Current Anticoagulation Instructions: INR 2.3 Continue taking 2 tablets on sunday, tuesday, thursday, and saturday. Take 1.5 tablets on monday, wednesday, and  friday. No changes today. See [place] in 4 weeks.

## 2010-09-26 NOTE — Medication Information (Signed)
Summary: rov/ajm  Anticoagulant Therapy  Managed by: [name], RN, BSN Referring MD: Charlton Haws MD Supervising MD: Daleen Squibb MD, Maisie Fus Indication 1: Aortic Valve Disorder (ICD-424.1) Lab Used: [facility] [place] Site: [address] INR POC 2.9 INR RANGE 2 - 3  Dietary changes: yes       Details: Eating less leafy veggies  Health status changes: yes       Details: Palpitations - pending Tikosyn  Bleeding/hemorrhagic complications: no    Recent/future hospitalizations: no    Any changes in medication regimen? no    Recent/future dental: no  Any missed doses?: yes     Details: Missed Friday's dose possible  Is patient compliant with meds? yes       Allergies: No Known Drug Allergies  Anticoagulation Management History:      The patient is taking warfarin and comes in today for a routine follow up visit.  Positive risk factors for bleeding include an age of 75 years or older.  The bleeding index is 'intermediate risk'.  Positive CHADS2 values include History of HTN.  Negative CHADS2 values include Age > 92 years old.  The start date was 06/26/2002.  His last INR was 2.7.  Anticoagulation responsible provider: Daleen Squibb MD, Maisie Fus.  INR POC: 2.9.  Cuvette Lot#: [id number].  Exp: 06/2011.    Anticoagulation Management Assessment/Plan:      The patient's current anticoagulation dose is Warfarin sodium 5 mg tabs: Use as directed by Anticoagulation Clinic.  The target INR is 2 - 3.  The next INR is due 06/23/2010.  Anticoagulation instructions were given to patient.  Results were reviewed/authorized by [name], RN, BSN.  He was notified by [name], RN, BSN.         Prior Anticoagulation Instructions: Today's INR is 2.7.  Continue current regimen of 2 tablets every day except 1.5 tablets on Mondays and Fridays.  Current Anticoagulation Instructions: INR 2.9 Continue 10mg  daily except 7.5mg s on Mondays and Fridays. Recheck in 10 days after Tikosyn start.

## 2010-09-26 NOTE — Assessment & Plan Note (Signed)
Summary: per check outs/f   Visit Type:  Follow-up Primary Provider:  Catha Gosselin   History of Present Illness: [name] returns today for followup of atrial fibrillation.  The patient underwent CABG/AVR many years ago.  He has had increasingly frequent episodes of atrial fib and is now persistent.  We had him admitted for Tikosyn loading and he went back to NSR but then back to atrial fib.  His QT was long and we ultimately decided to stop his Tikosyn and increase his  carvedilol.  He is improved.  He has not had frank syncope. He does note some peripheral edema. His wife who is with him today states that dyspnea is better, now class 2.  Current Medications (verified): 1)  Lipitor 40 Mg Tabs (Atorvastatin Calcium) .... Take 1 Tablet Once A Day 2)  Nitroglycerin 0.4 Mg Subl (Nitroglycerin) .... As Needed 3)  Ramipril 10 Mg Caps (Ramipril) .[name].. 1 Tab By Mouth Two Times A Day 4)  Torsemide 10 Mg Tabs (Torsemide) .... Take 2 Tabs Daily 5)  Klor-Con 8 Meq Cr-Tabs (Potassium Chloride) .[name].. 1 Tablet Once Daily 6)  Metoclopramide Hcl 10 Mg Tabs (Metoclopramide Hcl) .... Two Times A Day 7)  Tylenol Extra Strength 500 Mg Tabs (Acetaminophen) .... Prn 8)  Warfarin Sodium 5 Mg Tabs (Warfarin Sodium) .... Use As Directed By Anticoagulation Clinic 9)  Flomax 0.4 Mg Caps (Tamsulosin Hcl) .... Take 1 Capsule Daily 10)  Fish Oil   Oil (Fish Oil) .[name].. 1 Tab By Mouth Once Daily 11)  Multivitamins   Tabs (Multiple Vitamin) .[name].. 1 Tab By Mouth Once Daily 12)  Vitamin B12 .[name].. 1 Tab By Mouth Once Daily 13)  Carvedilol 12.5 Mg Tabs (Carvedilol) .... Take One Tablet By Mouth Twice A Day 14)  Allegra Allergy 180 Mg Tabs (Fexofenadine Hcl) .... As Needed 15)  Calcium Antacid 500 Mg Chew (Calcium Carbonate Antacid) .... Uad 16)  Digoxin 0.125 Mg Tabs (Digoxin) .... Take One Tablet By Mouth Daily  Allergies (verified): No Known Drug Allergies  Past History:  Past Medical History: Last updated:  01/21/2009 ALTERED MENTAL STATUS (ICD-780.97) HYPOKALEMIA (ICD-276.8) ENCOUNTER FOR LONG-TERM USE OF ANTICOAGULANTS (ICD-V58.61) HYPERLIPIDEMIA (ICD-272.4) ATRIAL FIBRILLATION (ICD-427.31) CAD (ICD-414.00) AORTIC VALVE REPLACEMENT, HX OF (ICD-V43.3) HYPERTENSION (ICD-401.9)  Review of Systems       The patient complains of dyspnea on exertion and peripheral edema.  The patient denies chest pain and syncope.    Vital Signs:  Patient profile:   77 year old male Height:      76 inches Weight:      295 pounds BMI:     36.04 Pulse rate:   75 / minute BP sitting:   130 / 76  (left arm)  Vitals Entered By: Laurance Flatten CMA (August 01, 2010 2:00 PM)  Physical Exam  General:  Affect appropriate Healthy:  appears stated age HEENT: normal Neck supple with no adenopathy JVP normal no bruits no thyromegaly Lungs clear with no wheezing and good diaphragmatic motion Heart:  S1/S2 systolic murmur,rub, gallop or click PMI normal. IRIR Abdomen: benighn, BS positve, no tenderness, no AAA no bruit.  No HSM or HJR Distal pulses intact with no bruits Plus one bilateral edema Neuro non-focal Skin warm and dry    EKG  Procedure date:  08/01/2010  Findings:      Atrial fibrillation with a controlled ventricular response rate of: 75.  Impression & Recommendations:  Problem # 1:  ATRIAL FIBRILLATION (ICD-427.31) His ventricular rate  is well controlled. I have asked him to uptitrate his coreg as I suspect his rate during exertion remains elevated. His updated medication list for this problem includes:    Warfarin Sodium 5 Mg Tabs (Warfarin sodium) ..... Use as directed by anticoagulation clinic    Carvedilol 12.5 Mg Tabs (Carvedilol) .[name]... Take one tablet by mouth twice a day    Digoxin 0.125 Mg Tabs (Digoxin) .[name]... Take one tablet by mouth daily  Problem # 2:  HYPERTENSION (ICD-401.9) His blood pressure is elevated slightly and will increase his beta blocker. His updated  medication list for this problem includes:    Ramipril 10 Mg Caps (Ramipril) .[name]... 1 tab by mouth two times a day    Torsemide 10 Mg Tabs (Torsemide) .[name]... Take 2 tabs daily    Carvedilol 12.5 Mg Tabs (Carvedilol) .[name]... Take 1 1/2 tablet by mouth twice a day  Patient Instructions: 1)  Your physician recommends that you schedule a follow-up appointment in: 3 months with Dr Ladona Ridgel  2)  Your physician has recommended you make the following change in your medication: increase Carvedilol to one in am and 1 1/2 in the pm for 2 weeks then increase to 1 1/2 tablets twice daily Prescriptions: CARVEDILOL 12.5 MG TABS (CARVEDILOL) Take 1 1/2 tablet by mouth twice a day  #90 x 6   Entered by:   Dennis Bast, RN, BSN   Authorized by:   Laren Boom, MD, [facility]   Signed by:   Dennis Bast, RN, BSN on 08/01/2010   Method used:   Electronically to        [facility] (retail)       [address]       [place], [place]  [address]       Ph: [phone] or [phone]       Fax: [phone]   RxID:   [id number]

## 2010-09-26 NOTE — Medication Information (Signed)
Summary: rov/sp  Anticoagulant Therapy  Managed by: Weston Brass, PharmD Referring MD: Charlton Haws MD Supervising MD: [name] MD, Theron Arista Indication 1: Aortic Valve Disorder (ICD-424.1) Indication 2: Atrial Fibrillation Lab Used: [facility] [place] Site: [address] INR POC 3.3 INR RANGE 2 - 3  Dietary changes: no    Health status changes: no    Bleeding/hemorrhagic complications: no    Recent/future hospitalizations: no    Any changes in medication regimen? no    Recent/future dental: no  Any missed doses?: no       Is patient compliant with meds? yes       Allergies: No Known Drug Allergies  Anticoagulation Management History:      The patient is taking warfarin and comes in today for a routine follow up visit.  Positive risk factors for bleeding include an age of 75 years or older.  The bleeding index is 'intermediate risk'.  Positive CHADS2 values include History of HTN.  Negative CHADS2 values include Age > 74 years old.  The start date was 06/26/2002.  His last INR was 3.0 ratio.  Anticoagulation responsible provider: [name] MD, Theron Arista.  INR POC: 3.3.  Cuvette Lot#: [id number].  Exp: 05/2011.    Anticoagulation Management Assessment/Plan:      The patient's current anticoagulation dose is Warfarin sodium 5 mg tabs: Use as directed by Anticoagulation Clinic.  The target INR is 2 - 3.  The next INR is due 08/29/2010.  Anticoagulation instructions were given to patient.  Results were reviewed/authorized by Weston Brass, PharmD.  He was notified by Weston Brass PharmD.         Prior Anticoagulation Instructions: INR 2.7 Continue 10mg s everyday except 7.5mg s on Mondays and Fridays. Recheck in 4 weeks.   Current Anticoagulation Instructions: INR 3.3  Skip today's dose of Coumadin then resume same dose of 2 tablets every day except 1 1/2 tablets on Monday and Friday.  Recheck INR in 4 weeks.

## 2010-09-26 NOTE — Medication Information (Signed)
Summary: rov/cb  Anticoagulant Therapy  Managed by: [name], RN, BSN Referring MD: Charlton Haws MD Supervising MD: Tenny Craw MD, Gunnar Fusi Indication 1: Aortic Valve Disorder (ICD-424.1) Lab Used: [facility] [place] Site: [address] INR POC 2.1 INR RANGE 2 - 3  Dietary changes: no    Health status changes: no    Bleeding/hemorrhagic complications: no    Recent/future hospitalizations: no    Any changes in medication regimen? yes       Details: Started Flomax about a week ago  Recent/future dental: no  Any missed doses?: no       Is patient compliant with meds? yes       Current Medications (verified): 1)  Amlodipine Besylate 10 Mg Tabs (Amlodipine Besylate) .[name].. 1 Tab By Mouth Once Daily 2)  Lipitor 40 Mg Tabs (Atorvastatin Calcium) .... Take 1 Tablet Once A Day 3)  Nitroglycerin 0.4 Mg Subl (Nitroglycerin) 4)  Ramipril 10 Mg Caps (Ramipril) .[name].. 1 Tab By Mouth Two Times A Day 5)  Hydrochlorothiazide 25 Mg Tabs (Hydrochlorothiazide) .... Take One Tablet Once Daily 6)  Sotalol Hcl 80 Mg Tabs (Sotalol Hcl) .... Take One Tablet Every 12 Hours 7)  Klor-Con 8 Meq Cr-Tabs (Potassium Chloride) .[name].. 1 Tablet Once Daily 8)  Metoclopramide Hcl 10 Mg Tabs (Metoclopramide Hcl) .... Two Times A Day 9)  Tylenol Extra Strength 500 Mg Tabs (Acetaminophen) .... Prn 10)  Warfarin Sodium 5 Mg Tabs (Warfarin Sodium) .... Use As Directed By Anticoagulation Clinic 11)  Flomax 0.4 Mg Caps (Tamsulosin Hcl) .... Take 1 Capsule Daily  Allergies: No Known Drug Allergies  Anticoagulation Management History:      The patient is taking warfarin and comes in today for a routine follow up visit.  Positive risk factors for bleeding include an age of 37 years or older.  The bleeding index is 'intermediate risk'.  Positive CHADS2 values include History of HTN.  Negative CHADS2 values include Age > 43 years old.  The start date was 06/26/2002.  His last INR was 1.6.  Anticoagulation  responsible provider: Tenny Craw MD, Gunnar Fusi.  INR POC: 2.1.  Cuvette Lot#: [id number].  Exp: 01/2011.    Anticoagulation Management Assessment/Plan:      The patient's current anticoagulation dose is Warfarin sodium 5 mg tabs: Use as directed by Anticoagulation Clinic.  The target INR is 2 - 3.  The next INR is due 01/27/2010.  Anticoagulation instructions were given to patient.  Results were reviewed/authorized by [name], RN, BSN.  He was notified by [name], RN, BSN.         Prior Anticoagulation Instructions: INR 1.7. Take 2 tablets today, then take 1.5 tablets daily except 2 tablets on Tues, Thurs, Sat.  Current Anticoagulation Instructions: INR 2.1 Continue 7.5mg s daily except 10mg s on Tuesdays, Thursdays and Saturdays. Recheck in 4 weeks.

## 2010-09-26 NOTE — Medication Information (Signed)
Summary: rov/tm  Anticoagulant Therapy  Managed by: Cloyde Reams, RN, BSN Referring MD: Charlton Haws MD Supervising MD: Juanda Chance MD, Damare Serano Indication 1: Aortic Valve Disorder (ICD-424.1) Lab Used: [facility] [place] Site: [address] INR POC 3.0 INR RANGE 2 - 3  Dietary changes: no    Health status changes: no    Bleeding/hemorrhagic complications: no    Recent/future hospitalizations: no    Any changes in medication regimen? no    Recent/future dental: no  Any missed doses?: yes     Details: Missed 1 dose approx 1 1/2 weeks ago.    Is patient compliant with meds? yes       Allergies (verified): No Known Drug Allergies  Anticoagulation Management History:      The patient is taking warfarin and comes in today for a routine follow up visit.  Positive risk factors for bleeding include an age of 75 years or older.  The bleeding index is 'intermediate risk'.  Positive CHADS2 values include History of HTN.  Negative CHADS2 values include Age > 75 years old.  The start date was 06/26/2002.  His last INR was 1.6.  Anticoagulation responsible provider: Juanda Chance MD, Smitty Cords.  INR POC: 3.0.  Cuvette Lot#: [id number].  Exp: 11/2010.    Anticoagulation Management Assessment/Plan:      The patient's current anticoagulation dose is Warfarin sodium 5 mg tabs: Use as directed by Anticoagulation Clinic.  The target INR is 2 - 3.  The next INR is due 11/25/2009.  Anticoagulation instructions were given to patient.  Results were reviewed/authorized by Cloyde Reams, RN, BSN.  He was notified by Cloyde Reams RN.         Prior Anticoagulation Instructions: INR 2.5 Continue 7.5mg  everyday except 10mg s on Tuesday, Thursdays, and Saturdays. Recheck in 4 weeks.   Current Anticoagulation Instructions: INR 3.0  Continue on same dosage 1.5 tablets daily except 2 tablets on Tuesdays, Thursdays, and Saturdays.  Recheck in 4 weeks.

## 2010-09-26 NOTE — Medication Information (Signed)
Summary: rov/sp  Anticoagulant Therapy  Managed by: [name], RN, BSN Referring MD: Charlton Haws MD Supervising MD: Shirlee Latch MD, Garnett Nunziata Indication 1: Aortic Valve Disorder (ICD-424.1) Lab Used: [facility] [place] Site: [address] INR POC 1.8 INR RANGE 2 - 3  Dietary changes: no    Health status changes: no    Bleeding/hemorrhagic complications: no    Recent/future hospitalizations: no    Any changes in medication regimen? no    Recent/future dental: no  Any missed doses?: no       Is patient compliant with meds? yes       Allergies: No Known Drug Allergies  Anticoagulation Management History:      The patient is taking warfarin and comes in today for a routine follow up visit.  Positive risk factors for bleeding include an age of 75 years or older.  The bleeding index is 'intermediate risk'.  Positive CHADS2 values include History of HTN.  Negative CHADS2 values include Age > 61 years old.  The start date was 06/26/2002.  His last INR was 1.6.  Anticoagulation responsible provider: Shirlee Latch MD, Allina Riches.  INR POC: 1.8.  Cuvette Lot#: [id number].  Exp: 05/2011.    Anticoagulation Management Assessment/Plan:      The patient's current anticoagulation dose is Warfarin sodium 5 mg tabs: Use as directed by Anticoagulation Clinic.  The target INR is 2 - 3.  The next INR is due 04/05/2010.  Anticoagulation instructions were given to patient.  Results were reviewed/authorized by [name], RN, BSN.  He was notified by [name], RN, BSN.         Prior Anticoagulation Instructions: INR 2.5  Continue same dose of 1 1/2 tablets every day except 2 tablets on Tuesday, Thursday and Saturday.    Current Anticoagulation Instructions: INR 1.8 Today take 10mg s then change dose to 10mg s everyday except 7.5mg s on Mondays, Wednesdays and Fridays. Recheck in 3 weeks per Pharm D.

## 2010-09-26 NOTE — Medication Information (Signed)
Summary: rov/sel  Anticoagulant Therapy  Managed by: [name], RN, BSN Referring MD: Charlton Haws MD Supervising MD: Riley Kill MD, Maisie Fus Indication 1: Aortic Valve Disorder (ICD-424.1) Indication 2: Atrial Fibrillation Lab Used: [facility] [place] Site: [address] INR POC 2.7 INR RANGE 2 - 3  Dietary changes: no    Health status changes: yes       Details: tired and weak and SOB  Bleeding/hemorrhagic complications: no    Recent/future hospitalizations: no    Any changes in medication regimen? no    Recent/future dental: no  Any missed doses?: no       Is patient compliant with meds? yes      Comments: Seeing Dr Ladona Ridgel today.   Allergies: No Known Drug Allergies  Anticoagulation Management History:      The patient is taking warfarin and comes in today for a routine follow up visit.  Positive risk factors for bleeding include an age of 31 years or older.  The bleeding index is 'intermediate risk'.  Positive CHADS2 values include History of HTN.  Negative CHADS2 values include Age > 45 years old.  The start date was 06/26/2002.  His last INR was 3.0 ratio.  Anticoagulation responsible Jasmon Graffam: Riley Kill MD, Maisie Fus.  INR POC: 2.7.  Cuvette Lot#: [id number].  Exp: 07/2011.    Anticoagulation Management Assessment/Plan:      The patient's current anticoagulation dose is Warfarin sodium 5 mg tabs: Use as directed by Anticoagulation Clinic.  The target INR is 2 - 3.  The next INR is due 07/26/2010.  Anticoagulation instructions were given to patient.  Results were reviewed/authorized by [name], RN, BSN.  He was notified by [name], RN, BSN.         Prior Anticoagulation Instructions: INR 3.0  Take one tablet today. Then continue taking same dose of 2 tablets everyday except 1 1/2 tablets on Monday and Friday. Recheck in 1 week.   Current Anticoagulation Instructions: INR 2.7 Continue 10mg s everyday except 7.5mg s on Mondays and Fridays. Recheck in  4 weeks.

## 2010-09-26 NOTE — Progress Notes (Signed)
Summary: question re appt  Phone Note Call from Patient   Caller: [name]  [phone] Reason for Call: Talk to Nurse Summary of Call: pt's wife calling to find out why pt is not seeing dr taylor until [facility] post [facility], thinks he should be seen earlier Initial call taken by: Glynda Jaeger,  June 22, 2010 1:37 PM  Follow-up for Phone Call        spoke with wife she is very anxious in regards to his apt not being until 07/10/10.  I moved his apt up to 06/28/10 This made her feel better.  He still feels somewhat winded but better than he felt prior to admission for Tikosyn load that was unsuccessful Dennis Bast, RN, BSN  June 22, 2010 4:36 PM

## 2010-09-26 NOTE — Assessment & Plan Note (Signed)
Summary: CONSULTS FOR RECURRENT AFIB/SL   Visit Type:  Follow-up  CC:  sob, lightheaded, and irregular heartbeat.  History of Present Illness: Mr. [name] is referred today by Dr. [name] for evaluation of atrial fibrillation.  The patient underwent CABG/AVR many years ago.  He has had increasingly frequent episodes of atrial fib and is now persistent.  He feels sob and fatigued.  He has not had frank syncope. He does note some peripheral edema.  His sotalol was stopped. He has been poorly controlled from the ventricular rate perspective.  Current Medications (verified): 1)  Lipitor 40 Mg Tabs (Atorvastatin Calcium) .... Take 1 Tablet Once A Day 2)  Nitroglycerin 0.4 Mg Subl (Nitroglycerin) .... As Needed 3)  Ramipril 10 Mg Caps (Ramipril) .[name].. 1 Tab By Mouth Two Times A Day 4)  Demadex 20 Mg Tabs (Torsemide) .... Take One Tab By Mouth Once Daily 5)  Klor-Con 8 Meq Cr-Tabs (Potassium Chloride) .[name].. 1 Tablet Once Daily 6)  Metoclopramide Hcl 10 Mg Tabs (Metoclopramide Hcl) .... Two Times A Day 7)  Tylenol Extra Strength 500 Mg Tabs (Acetaminophen) .... Prn 8)  Warfarin Sodium 5 Mg Tabs (Warfarin Sodium) .... Use As Directed By Anticoagulation Clinic 9)  Flomax 0.4 Mg Caps (Tamsulosin Hcl) .... Take 1 Capsule Daily 10)  Fish Oil   Oil (Fish Oil) .[name].. 1 Tab By Mouth Once Daily 11)  Multivitamins   Tabs (Multiple Vitamin) .[name].. 1 Tab By Mouth Once Daily 12)  Vitamin B12 .[name].. 1 Tab By Mouth Once Daily 13)  Carvedilol 3.125 Mg Tabs (Carvedilol) .... Take Two Tablet By Mouth Twice A Day 14)  Ranitidine Hcl 150 Mg Caps (Ranitidine Hcl) .... Take One Two Times A Day  Allergies: No Known Drug Allergies  Past History:  Past Medical History: Last updated: 01/21/2009 ALTERED MENTAL STATUS (ICD-780.97) HYPOKALEMIA (ICD-276.8) ENCOUNTER FOR LONG-TERM USE OF ANTICOAGULANTS (ICD-V58.61) HYPERLIPIDEMIA (ICD-272.4) ATRIAL FIBRILLATION (ICD-427.31) CAD (ICD-414.00) AORTIC VALVE REPLACEMENT, HX OF  (ICD-V43.3) HYPERTENSION (ICD-401.9)  Past Surgical History: Last updated: 01/21/2009 CABG/AVR:  2003 Thoracic T12 compression fracture  Family History: Last updated: 01/21/2009 Hypertension and diabetes.  Social History: Last updated: 01/21/2009   The patient is retired from [employer].  He   has been married 53 years and has two children.  He stopped smoking   approximately 35 years ago.  Denies any illicit drugs or alcohol use. Hypertension and diabetes.  Review of Systems       All systems reviewed and negative except as noted in the HPI.  Vital Signs:  Patient profile:   77 year old male Height:      76 inches Weight:      292 pounds Pulse rate:   88 / minute BP sitting:   140 / 90  (right arm)  Vitals Entered By: Jacquelin Hawking, CMA (May 24, 2010 2:39 PM)  Physical Exam  General:  Affect appropriate Healthy:  appears stated age HEENT: normal Neck supple with no adenopathy JVP normal no bruits no thyromegaly Lungs clear with no wheezing and good diaphragmatic motion Heart:  S1/S2 systolic murmur,rub, gallop or click PMI normal. IRIR Abdomen: benighn, BS positve, no tenderness, no AAA no bruit.  No HSM or HJR Distal pulses intact with no bruits Plus one bilateral edema Neuro non-focal Skin warm and dry    EKG  Procedure date:  05/24/2010  Findings:      Atrial fibrillation with an uncontrolled ventricular response rate of: 101.  Impression & Recommendations:  Problem #  1:  ATRIAL FIBRILLATION (ICD-427.31) His ventricular rate is not well controlled.  I have discussed the treatment options with the patient and his family.  I have recommended either Tikosyn or amiodarone.  The risks/benefits/of each approach have been discussed with the patient and he wishes to proceed with in patient admission forTikosyn. His updated medication list for this problem includes:    Warfarin Sodium 5 Mg Tabs (Warfarin sodium) ..... Use as directed by  anticoagulation clinic    Carvedilol 6.25 Mg Tabs (Carvedilol) .[name]... Take one tablet by mouth twice a day  Orders: EKG w/ Interpretation (93000) TLB-BMP (Basic Metabolic Panel-BMET) (80048-METABOL) TLB-CBC Platelet - w/Differential (85025-CBCD) TLB-PT (Protime) (85610-PTP) TLB-PTT (85730-PTTL) TLB-Magnesium (Mg) (83735-MG)  Problem # 2:  CAD (ICD-414.00) His symptoms are quiet.  He will continue his current meds. His updated medication list for this problem includes:    Nitroglycerin 0.4 Mg Subl (Nitroglycerin) .[name]... As needed    Ramipril 10 Mg Caps (Ramipril) .[name]... 1 tab by mouth two times a day    Warfarin Sodium 5 Mg Tabs (Warfarin sodium) ..... Use as directed by anticoagulation clinic    Carvedilol 6.25 Mg Tabs (Carvedilol) .[name]... Take one tablet by mouth twice a day  Orders: TLB-BMP (Basic Metabolic Panel-BMET) (80048-METABOL) TLB-CBC Platelet - w/Differential (85025-CBCD) TLB-PT (Protime) (85610-PTP) TLB-PTT (85730-PTTL) TLB-Magnesium (Mg) (83735-MG)  Problem # 3:  HYPERTENSION (ICD-401.9) His blood pressure is well controlled.  Continue a low sodium diet. His updated medication list for this problem includes:    Ramipril 10 Mg Caps (Ramipril) .[name]... 1 tab by mouth two times a day    Demadex 20 Mg Tabs (Torsemide) .[name]... Take one tab by mouth once daily    Carvedilol 6.25 Mg Tabs (Carvedilol) .[name]... Take one tablet by mouth twice a day  Orders: TLB-BMP (Basic Metabolic Panel-BMET) (80048-METABOL) TLB-CBC Platelet - w/Differential (85025-CBCD) TLB-PT (Protime) (85610-PTP) TLB-PTT (85730-PTTL) TLB-Magnesium (Mg) (83735-MG)  Patient Instructions: 1)  admit on Mon 05/29/10  The hospital will call you with time to be at hospital that morning 2)  Your physician has recommended you make the following change in your medication: increase Coreg 6.25mg  two times a day Prescriptions: CARVEDILOL 6.25 MG TABS (CARVEDILOL) Take one tablet by mouth twice a day  #60 x 6   Entered by:    Dennis Bast, RN, BSN   Authorized by:   Laren Boom, MD, [facility]   Signed by:   Dennis Bast, RN, BSN on 05/24/2010   Method used:   Electronically to        [facility]  [place] [address] (retail)       [address]       [place], [place]  [address]       Ph: [phone] or [phone]       Fax: [phone]   RxID:   [id number]

## 2010-09-26 NOTE — Consult Note (Signed)
Summary: [facility]   Imported By: Roderic Ovens 03/13/2010 10:04:36  _____________________________________________________________________  External Attachment:    Type:   Image     Comment:   External Document

## 2010-09-26 NOTE — Assessment & Plan Note (Signed)
Summary: Lab orders   

## 2010-09-26 NOTE — Medication Information (Signed)
Summary: rov/tm  Anticoagulant Therapy  Managed by: [name], RN, BSN Referring MD: Charlton Haws MD Supervising MD: Johney Frame MD, Fayrene Fearing Indication 1: Aortic Valve Disorder (ICD-424.1) Lab Used: [facility] [place] Site: [address] INR POC 2.5 INR RANGE 2 - 3  Dietary changes: no    Health status changes: no    Bleeding/hemorrhagic complications: no    Recent/future hospitalizations: no    Any changes in medication regimen? no    Recent/future dental: no  Any missed doses?: no       Is patient compliant with meds? yes       Allergies: No Known Drug Allergies  Anticoagulation Management History:      The patient is taking warfarin and comes in today for a routine follow up visit.  Positive risk factors for bleeding include an age of 75 years or older.  The bleeding index is 'intermediate risk'.  Positive CHADS2 values include History of HTN.  Negative CHADS2 values include Age > 73 years old.  The start date was 06/26/2002.  His last INR was 1.6.  Anticoagulation responsible provider: Tangee Marszalek MD, Fayrene Fearing.  INR POC: 2.5.  Cuvette Lot#: [id number].  Exp: 11/2010.    Anticoagulation Management Assessment/Plan:      The patient's current anticoagulation dose is Warfarin sodium 5 mg tabs: Use as directed by Anticoagulation Clinic.  The target INR is 2 - 3.  The next INR is due 10/28/2009.  Anticoagulation instructions were given to patient.  Results were reviewed/authorized by [name], RN, BSN.  He was notified by [name], RN, BSN.         Prior Anticoagulation Instructions: INR 2.7 Continue 7.5mg s daily except 10mg s on Tuesdays, Thursdays, and Saturdays. Recheck in 4 weeks.   Current Anticoagulation Instructions: INR 2.5 Continue 7.5mg  everyday except 10mg s on Tuesday, Thursdays, and Saturdays. Recheck in 4 weeks.

## 2010-09-26 NOTE — Medication Information (Signed)
Summary: rov/sak  Anticoagulant Therapy  Managed by: Weston Brass, PharmD Referring MD: Charlton Haws MD Supervising MD: Gala Romney MD, Reuel Boom Indication 1: Aortic Valve Disorder (ICD-424.1) Lab Used: [facility] [place] Site: [address] INR POC 2.5 INR RANGE 2 - 3  Dietary changes: no    Health status changes: no    Bleeding/hemorrhagic complications: no    Recent/future hospitalizations: no    Any changes in medication regimen? no    Recent/future dental: no  Any missed doses?: no       Is patient compliant with meds? yes      Comments: Pt reports taking only 7.5mg  on Monday instead of 10mg  on Monday since last visit.   Allergies: No Known Drug Allergies  Anticoagulation Management History:      The patient is taking warfarin and comes in today for a routine follow up visit.  Positive risk factors for bleeding include an age of 75 years or older.  The bleeding index is 'intermediate risk'.  Positive CHADS2 values include History of HTN.  Negative CHADS2 values include Age > 89 years old.  The start date was 06/26/2002.  His last INR was 1.6.  Anticoagulation responsible provider: Britton Perkinson MD, Reuel Boom.  INR POC: 2.5.  Cuvette Lot#: [id number].  Exp: 04/2011.    Anticoagulation Management Assessment/Plan:      The patient's current anticoagulation dose is Warfarin sodium 5 mg tabs: Use as directed by Anticoagulation Clinic.  The target INR is 2 - 3.  The next INR is due 03/14/2010.  Anticoagulation instructions were given to patient.  Results were reviewed/authorized by Weston Brass, PharmD.  He was notified by Weston Brass PharmD.         Prior Anticoagulation Instructions: INR 1.5  Increase to 10 mgs on Monday's, in addition to Tuesday's, Thursday's and Saturday's.  Take 7.5 mgs on Wednesday's, Friday's, and Sunday's.  Recheck in 3 weeks.     Current Anticoagulation Instructions: INR 2.5  Continue same dose of 1 1/2 tablets every day except 2 tablets on  Tuesday, Thursday and Saturday.

## 2010-09-26 NOTE — Medication Information (Signed)
Summary: rov/ewj  Anticoagulant Therapy  Managed by: [name], RN, BSN Referring MD: Charlton Haws MD Supervising MD: Antoine Poche MD, Fayrene Fearing Indication 1: Aortic Valve Disorder (ICD-424.1) Lab Used: [facility] [place] Site: [address] INR POC 2.7 INR RANGE 2 - 3  Dietary changes: no    Health status changes: no    Bleeding/hemorrhagic complications: no    Recent/future hospitalizations: no    Any changes in medication regimen? no    Recent/future dental: no  Any missed doses?: no       Is patient compliant with meds? yes       Allergies: No Known Drug Allergies  Anticoagulation Management History:      Positive risk factors for bleeding include an age of 75 years or older.  The bleeding index is 'intermediate risk'.  Positive CHADS2 values include History of HTN.  Negative CHADS2 values include Age > 50 years old.  The start date was 06/26/2002.  His last INR was 1.6.  Anticoagulation responsible provider: Antoine Poche MD, Fayrene Fearing.  INR POC: 2.7.  Exp: 09/2010.    Anticoagulation Management Assessment/Plan:      The patient's current anticoagulation dose is Warfarin sodium 5 mg tabs: Use as directed by Anticoagulation Clinic.  The target INR is 2 - 3.  The next INR is due 09/30/2009.  Anticoagulation instructions were given to patient.  Results were reviewed/authorized by [name], RN, BSN.  He was notified by [name], RN, BSN.         Prior Anticoagulation Instructions: INR 2.2  Continue on same dosage 1.5 tablets daily except 2 tablets on Tuesdays, Thursdays, and Saturdays.  Recheck in 4 weeks.    Current Anticoagulation Instructions: INR 2.7 Continue 7.5mg s daily except 10mg s on Tuesdays, Thursdays, and Saturdays. Recheck in 4 weeks.

## 2010-09-26 NOTE — Medication Information (Signed)
  Anticoagulant Therapy  Managed by: [name], RN, BSN Referring MD: Charlton Haws MD Supervising MD: Antoine Poche MD,Shaterrica Territo Indication 1: Aortic Valve Disorder (ICD-424.1) Lab Used: [facility] [place] [employer] Site: [address] INR POC 2.7 INR RANGE 2 - 3  Dietary changes: no    Health status changes: no    Bleeding/hemorrhagic complications: no    Recent/future hospitalizations: yes       Details: Will have cardiovert this month.  Any changes in medication regimen? no    Recent/future dental: no  Any missed doses?: no       Is patient compliant with meds? yes       Current Medications (verified): 1)  Lipitor 40 Mg Tabs (Atorvastatin Calcium) .... Take 1 Tablet Once A Day 2)  Nitroglycerin 0.4 Mg Subl (Nitroglycerin) .... As Needed 3)  Ramipril 10 Mg Caps (Ramipril) .[name].. 1 Tab By Mouth Two Times A Day 4)  Demadex 20 Mg Tabs (Torsemide) .... Take One Tab By Mouth Once Daily 5)  Klor-Con 8 Meq Cr-Tabs (Potassium Chloride) .[name].. 1 Tablet Once Daily 6)  Metoclopramide Hcl 10 Mg Tabs (Metoclopramide Hcl) .... Two Times A Day 7)  Tylenol Extra Strength 500 Mg Tabs (Acetaminophen) .... Prn 8)  Warfarin Sodium 5 Mg Tabs (Warfarin Sodium) .... Use As Directed By Anticoagulation Clinic 9)  Flomax 0.4 Mg Caps (Tamsulosin Hcl) .... Take 1 Capsule Daily 10)  Fish Oil   Oil (Fish Oil) .[name].. 1 Tab By Mouth Once Daily 11)  Multivitamins   Tabs (Multiple Vitamin) .[name].. 1 Tab By Mouth Once Daily 12)  Vitamin B12 .[name].. 1 Tab By Mouth Once Daily 13)  Carvedilol 6.25 Mg Tabs (Carvedilol) .... Take One Tablet By Mouth Twice A Day 14)  Ranitidine Hcl 150 Mg Caps (Ranitidine Hcl) .... Take One Two Times A Day  Allergies (verified): No Known Drug Allergies  Anticoagulation Management History:      The patient is taking warfarin and comes in today for a routine follow up visit.  Positive risk factors for bleeding include an age of 64 years or older.  The bleeding index is 'intermediate  risk'.  Positive CHADS2 values include History of HTN.  Negative CHADS2 values include Age > 54 years old.  The start date was 06/26/2002.  His last INR was 1.9 ratio and today's INR is 2.7.  Anticoagulation responsible provider: Lorina Duffner MD,Devinn Hurwitz.  INR POC: 2.7.  Cuvette Lot#: [id number].  Exp: 06/2011.    Anticoagulation Management Assessment/Plan:      The patient's current anticoagulation dose is Warfarin sodium 5 mg tabs: Use as directed by Anticoagulation Clinic.  The target INR is 2 - 3.  The next INR is due 06/12/2010.  Anticoagulation instructions were given to patient.  Results were reviewed/authorized by [name], RN, BSN.         Prior Anticoagulation Instructions: INR 2.5  Change dose to 2 tablets everyday except 1 1/2 on Monday and Friday. Recheck in one week.   Current Anticoagulation Instructions: Today's INR is 2.7.  Continue current regimen of 2 tablets every day except 1.5 tablets on Mondays and Fridays.

## 2010-09-26 NOTE — Medication Information (Signed)
Summary: rov/sp  Anticoagulant Therapy  Managed by: Rolland Porter, PharmD Referring MD: Charlton Haws MD Supervising MD: Antoine Poche MD, Fayrene Fearing Indication 1: Aortic Valve Disorder (ICD-424.1) Lab Used: [facility] [place] Site: [address] INR POC 1.5 INR RANGE 2 - 3  Dietary changes: no    Health status changes: no    Bleeding/hemorrhagic complications: no    Recent/future hospitalizations: no    Any changes in medication regimen? yes       Details: amoxicillin, mucinex, vesicare  Recent/future dental: no  Any missed doses?: no       Is patient compliant with meds? yes       Current Medications (verified): 1)  Amlodipine Besylate 10 Mg Tabs (Amlodipine Besylate) .[name].. 1 Tab By Mouth Once Daily 2)  Lipitor 40 Mg Tabs (Atorvastatin Calcium) .... Take 1 Tablet Once A Day 3)  Nitroglycerin 0.4 Mg Subl (Nitroglycerin) 4)  Ramipril 10 Mg Caps (Ramipril) .[name].. 1 Tab By Mouth Two Times A Day 5)  Hydrochlorothiazide 25 Mg Tabs (Hydrochlorothiazide) .... Take One Tablet Once Daily 6)  Sotalol Hcl 80 Mg Tabs (Sotalol Hcl) .... Take One Tablet Every 12 Hours 7)  Klor-Con 8 Meq Cr-Tabs (Potassium Chloride) .[name].. 1 Tablet Once Daily 8)  Metoclopramide Hcl 10 Mg Tabs (Metoclopramide Hcl) .... Two Times A Day 9)  Tylenol Extra Strength 500 Mg Tabs (Acetaminophen) .... Prn 10)  Warfarin Sodium 5 Mg Tabs (Warfarin Sodium) .... Use As Directed By Anticoagulation Clinic 11)  Flomax 0.4 Mg Caps (Tamsulosin Hcl) .... Take 1 Capsule Daily  Allergies (verified): No Known Drug Allergies  Anticoagulation Management History:      The patient is taking warfarin and comes in today for a routine follow up visit.  Positive risk factors for bleeding include an age of 75 years or older.  The bleeding index is 'intermediate risk'.  Positive CHADS2 values include History of HTN.  Negative CHADS2 values include Age > 105 years old.  The start date was 06/26/2002.  His last INR was 1.6.   Anticoagulation responsible provider: Antoine Poche MD, Fayrene Fearing.  INR POC: 1.5.  Cuvette Lot#: [id number].  Exp: 01/2011.    Anticoagulation Management Assessment/Plan:      The patient's current anticoagulation dose is Warfarin sodium 5 mg tabs: Use as directed by Anticoagulation Clinic.  The target INR is 2 - 3.  The next INR is due 02/21/2010.  Anticoagulation instructions were given to patient.  Results were reviewed/authorized by Rolland Porter, PharmD.  He was notified by Rolland Porter.         Prior Anticoagulation Instructions: INR 2.1 Continue 7.5mg s daily except 10mg s on Tuesdays, Thursdays and Saturdays. Recheck in 4 weeks.   Current Anticoagulation Instructions: INR 1.5  Increase to 10 mgs on Monday's, in addition to Tuesday's, Thursday's and Saturday's.  Take 7.5 mgs on Wednesday's, Friday's, and Sunday's.  Recheck in 3 weeks.

## 2010-09-26 NOTE — Assessment & Plan Note (Signed)
Summary: rov   CC:  sob.  History of Present Illness: [name] has a history of coronary artery disease status post CABG and aVR.  I believe this was done in 2003.  His last echo was done in February of 2009.  He had normal LV function.  Mean gradient across the valve is only 10 mm mercury with peak gradient of 18 mm mercury.  His also had paroxysmal atrial fibrillation with sick sinus syndrome.  He is on long-term sotalol.  He seems to be in afib more often than not since December.  He is having increasing LE edema and SOB.  He has had relative bradycardia in the past and BB's were stopped when on Sotolol.    The patient has not had any significant chest pain palpitations PND orthopnea or syncope.  He tends to not know when he is in atrial fibrillation.  I tried to teach him how to take his pulse but he has a hard time feeling it in his wrist  He had some sort of ablative procedure for renal CA by Dr Fredia Sorrow which was succesful and he has F/U with Dr. Isabel Caprice for this  Since he is much more dyspnic and fatigued I think more agressive Rx is indicated.  We will stop his Sotolol and have him see EP to see if Tikosyn or amiodarone would be in order.  We will strengthen his diuretic to Demedex.  Since the sotolol is being stopped he will start very low dose coreg.  He will continue his coumadin wsith an eye towards [facility] once we sort through his antiarrythmic.  I also think he will benefit from cardiac rehab.  Eventhough he has lost 6 pounds he continues to be very overweight and we discussed a low carb diet.    Current Problems (verified): 1)  Coumadin Therapy  (ICD-V58.61) 2)  Altered Mental Status  (ICD-780.97) 3)  Hypokalemia  (ICD-276.8) 4)  Encounter For Long-term Use of Anticoagulants  (ICD-V58.61) 5)  Hyperlipidemia  (ICD-272.4) 6)  Atrial Fibrillation  (ICD-427.31) 7)  Cad  (ICD-414.00) 8)  Aortic Valve Replacement, Hx of  (ICD-V43.3) 9)  Hypertension  (ICD-401.9)  Current Medications  (verified): 1)  Amlodipine Besylate 10 Mg Tabs (Amlodipine Besylate) .[name].. 1 Tab By Mouth Once Daily 2)  Lipitor 40 Mg Tabs (Atorvastatin Calcium) .... Take 1 Tablet Once A Day 3)  Nitroglycerin 0.4 Mg Subl (Nitroglycerin) .... As Needed 4)  Ramipril 10 Mg Caps (Ramipril) .[name].. 1 Tab By Mouth Two Times A Day 5)  Hydrochlorothiazide 25 Mg Tabs (Hydrochlorothiazide) .... Take One Tablet Once Daily 6)  Sotalol Hcl 80 Mg Tabs (Sotalol Hcl) .... Take One Tablet Every 12 Hours 7)  Klor-Con 8 Meq Cr-Tabs (Potassium Chloride) .[name].. 1 Tablet Once Daily 8)  Metoclopramide Hcl 10 Mg Tabs (Metoclopramide Hcl) .... Two Times A Day 9)  Tylenol Extra Strength 500 Mg Tabs (Acetaminophen) .... Prn 10)  Warfarin Sodium 5 Mg Tabs (Warfarin Sodium) .... Use As Directed By Anticoagulation Clinic 11)  Flomax 0.4 Mg Caps (Tamsulosin Hcl) .... Take 1 Capsule Daily 12)  Fish Oil   Oil (Fish Oil) .[name].. 1 Tab By Mouth Once Daily 13)  Multivitamins   Tabs (Multiple Vitamin) .[name].. 1 Tab By Mouth Once Daily 14)  Vitamin B12 .[name].. 1 Tab By Mouth Once Daily  Allergies (verified): No Known Drug Allergies  Past History:  Past Medical History: Last updated: 01/21/2009 ALTERED MENTAL STATUS (ICD-780.97) HYPOKALEMIA (ICD-276.8) ENCOUNTER FOR LONG-TERM USE OF ANTICOAGULANTS (ICD-V58.61) HYPERLIPIDEMIA (  ICD-272.4) ATRIAL FIBRILLATION (ICD-427.31) CAD (ICD-414.00) AORTIC VALVE REPLACEMENT, HX OF (ICD-V43.3) HYPERTENSION (ICD-401.9)  Past Surgical History: Last updated: 01/21/2009 CABG/AVR:  2003 Thoracic T12 compression fracture  Family History: Last updated: 01/21/2009 Hypertension and diabetes.  Social History: Last updated: 01/21/2009   The patient is retired from [employer].  He   has been married 53 years and has two children.  He stopped smoking   approximately 35 years ago.  Denies any illicit drugs or alcohol use. Hypertension and diabetes.  Review of Systems       Denies fever, malais, weight  loss, blurry vision, decreased visual acuity, cough, sputum, , hemoptysis, pleuritic pain, palpitaitons, heartburn, abdominal pain, melena, lower extremity  claudication, or rash.   Vital Signs:  Patient profile:   77 year old male Height:      76 inches Weight:      289 pounds BMI:     35.31 Pulse rate:   80 / minute Pulse rhythm:   irregularly irregular Resp:     18 per minute BP sitting:   115 / 76  (left arm)  Vitals Entered By: Kem Parkinson (April 26, 2010 9:21 AM)  Physical Exam  General:  Affect appropriate Healthy:  appears stated age HEENT: normal Neck supple with no adenopathy JVP normal no bruits no thyromegaly Lungs clear with no wheezing and good diaphragmatic motion Heart:  S1/S2 systolic murmur,rub, gallop or click PMI normal Abdomen: benighn, BS positve, no tenderness, no AAA no bruit.  No HSM or HJR Distal pulses intact with no bruits Plus one bilateral edema Neuro non-focal Skin warm and dry    Impression & Recommendations:  Problem # 1:  COUMADIN THERAPY (ICD-V58.61) Continue F/U in clinic with Afib.    Problem # 2:  ATRIAL FIBRILLATION (ICD-427.31)  Recurrent despite sotalol.  D/C sotalol and EPS eval ? Tikosyn The following medications were removed from the medication list:    Sotalol Hcl 80 Mg Tabs (Sotalol hcl) .[name]... Take one tablet every 12 hours His updated medication list for this problem includes:    Warfarin Sodium 5 Mg Tabs (Warfarin sodium) ..... Use as directed by anticoagulation clinic    Carvedilol 3.125 Mg Tabs (Carvedilol) .[name]... Take one tablet by mouth twice a day  His updated medication list for this problem includes:    Sotalol Hcl 80 Mg Tabs (Sotalol hcl) .[name]... Take one tablet every 12 hours    Warfarin Sodium 5 Mg Tabs (Warfarin sodium) ..... Use as directed by anticoagulation clinic  Orders: EKG w/ Interpretation (93000) EP Referral (Cardiology EP Ref )  Problem # 3:  HYPERLIPIDEMIA (ICD-272.4)  Continue statin  labs in 6 months His updated medication list for this problem includes:    Lipitor 40 Mg Tabs (Atorvastatin calcium) .[name]... Take 1 tablet once a day  His updated medication list for this problem includes:    Lipitor 40 Mg Tabs (Atorvastatin calcium) .[name]... Take 1 tablet once a day  Problem # 4:  CAD (ICD-414.00)  Lexiscan myovue given dyspnea and need to change antiarrythmic  R/O ischemia The following medications were removed from the medication list:    Amlodipine Besylate 10 Mg Tabs (Amlodipine besylate) .[name]... 1 tab by mouth once daily    Sotalol Hcl 80 Mg Tabs (Sotalol hcl) .[name]... Take one tablet every 12 hours His updated medication list for this problem includes:    Nitroglycerin 0.4 Mg Subl (Nitroglycerin) .[name]... As needed    Ramipril 10 Mg Caps (Ramipril) .[name]... 1 tab  by mouth two times a day    Warfarin Sodium 5 Mg Tabs (Warfarin sodium) ..... Use as directed by anticoagulation clinic    Carvedilol 3.125 Mg Tabs (Carvedilol) .[name]... Take one tablet by mouth twice a day  His updated medication list for this problem includes:    Amlodipine Besylate 10 Mg Tabs (Amlodipine besylate) .[name]... 1 tab by mouth once daily    Nitroglycerin 0.4 Mg Subl (Nitroglycerin) .[name]... As needed    Ramipril 10 Mg Caps (Ramipril) .[name]... 1 tab by mouth two times a day    Sotalol Hcl 80 Mg Tabs (Sotalol hcl) .[name]... Take one tablet every 12 hours    Warfarin Sodium 5 Mg Tabs (Warfarin sodium) ..... Use as directed by anticoagulation clinic  Orders: EKG w/ Interpretation (93000) Cardiac Rehabilitation (Cardiac Rehab)  Problem # 5:  AORTIC VALVE REPLACEMENT, HX OF (ICD-V43.3)  Normal exam with no AR.  Echo to assess and check LV function given edema and dyspnea  Orders: Echocardiogram (Echo)  Problem # 6:  HYPERTENSION (ICD-401.9)  Well controlled The following medications were removed from the medication list:    Amlodipine Besylate 10 Mg Tabs (Amlodipine besylate) .[name]... 1 tab by mouth once daily     Sotalol Hcl 80 Mg Tabs (Sotalol hcl) .[name]... Take one tablet every 12 hours His updated medication list for this problem includes:    Ramipril 10 Mg Caps (Ramipril) .[name]... 1 tab by mouth two times a day    Demadex 20 Mg Tabs (Torsemide) .[name]... Take one tab by mouth once daily    Carvedilol 3.125 Mg Tabs (Carvedilol) .[name]... Take one tablet by mouth twice a day  His updated medication list for this problem includes:    Amlodipine Besylate 10 Mg Tabs (Amlodipine besylate) .[name]... 1 tab by mouth once daily    Ramipril 10 Mg Caps (Ramipril) .[name]... 1 tab by mouth two times a day    Hydrochlorothiazide 25 Mg Tabs (Hydrochlorothiazide) .[name]... Take one tablet once daily    Sotalol Hcl 80 Mg Tabs (Sotalol hcl) .[name]... Take one tablet every 12 hours  Problem # 7:  EDEMA (ICD-782.3) Volume overload may be related to more persistant afib and lack of atrial synchrony.  D/C HCTZ and start Demedex  Other Orders: Nuclear Stress Test (Nuc Stress Test)  Patient Instructions: 1)  Stop Hydrochlorothiazide (HCTZ). 2)  Stop amlodipine. 3)  Stop sotalol. 4)  Start Demadex 20mg  once daily. 5)  Start Carvedilol 3.125mg  two times a day. 6)  Your physician has requested that you have an echocardiogram.  Echocardiography is a painless test that uses sound waves to create images of your heart. It provides your doctor with information about the size and shape of your heart and how well your heart's chambers and valves are working.  This procedure takes approximately one hour. There are no restrictions for this procedure. 7)  Your physician has requested that you have a Lexiscan myoview.  For further information please visit [email or web address]  Please follow instruction sheet, as given. 8)  We will refer to one of our EP doctors within the next 3-4 weeks. 9)  Your physician recommends that you schedule a follow-up appointment : after your EP appointment. 10)  We will refer you to Cardiac Rehab. Prescriptions: CARVEDILOL 3.125  MG TABS (CARVEDILOL) Take one tablet by mouth twice a day  #60 x 6   Entered by:   Sherri Rad, RN, BSN   Authorized by:   Colon Branch, MD, [facility]   Signed  by:   Sherri Rad, RN, BSN on 04/26/2010   Method used:   Electronically to        [facility]  [place] [address] (retail)       [address]       [place], [place]  [address]       Ph: [phone] or [phone]       Fax: [phone]   RxID:   [id number] DEMADEX 20 MG TABS (TORSEMIDE) take one tab by mouth once daily  #30 x 6   Entered by:   Sherri Rad, RN, BSN   Authorized by:   Colon Branch, MD, [facility]   Signed by:   Sherri Rad, RN, BSN on 04/26/2010   Method used:   [facility]        [facility]  [place] [address]ret[facility]       [address]       [place], [place]  [address]       Ph: [phone] or [phone]       Fax: [phone]   RxID:   [id number]    EKG Report  Procedure date:  04/26/2010  Findings:      Afbi 79 QT 400 Nonspecific ST//Twave changes

## 2010-09-26 NOTE — Medication Information (Signed)
Summary: rov/tm  Anticoagulant Therapy  Managed by: [name], RN, BSN Referring MD: Charlton Haws MD Supervising MD: Excell Seltzer MD, Casimiro Needle Indication 1: Aortic Valve Disorder (ICD-424.1) Lab Used: [facility] [place] Site: [address] INR POC 3.0 INR RANGE 2 - 3  Dietary changes: no    Health status changes: no    Bleeding/hemorrhagic complications: no    Recent/future hospitalizations: yes       Details: Started Tikosyn, but had to stop it, went back out of rhythm.  Any changes in medication regimen? no    Recent/future dental: no  Any missed doses?: no       Is patient compliant with meds? yes      Comments: Will see Ladona Ridgel for s/p Tikosyn failure on 11/2  Allergies: No Known Drug Allergies  Anticoagulation Management History:      The patient is taking warfarin and comes in today for a routine follow up visit.  Positive risk factors for bleeding include an age of 75 years or older.  The bleeding index is 'intermediate risk'.  Positive CHADS2 values include History of HTN.  Negative CHADS2 values include Age > 21 years old.  The start date was 06/26/2002.  His last INR was 3.0 ratio.  Anticoagulation responsible Yoshua Geisinger: Excell Seltzer MD, Casimiro Needle.  INR POC: 3.0.  Cuvette Lot#: [id number].  Exp: 07/2011.    Anticoagulation Management Assessment/Plan:      The patient's current anticoagulation dose is Warfarin sodium 5 mg tabs: Use as directed by Anticoagulation Clinic.  The target INR is 2 - 3.  The next INR is due 06/28/2010.  Anticoagulation instructions were given to patient.  Results were reviewed/authorized by [name], RN, BSN.  He was notified by Ilean Skill D candidate.         Prior Anticoagulation Instructions: INR 2.9 Continue 10mg  daily except 7.5mg s on Mondays and Fridays. Recheck in 10 days after Tikosyn start.  Current Anticoagulation Instructions: INR 3.0  Take one tablet today. Then continue taking same dose of 2 tablets everyday  except 1 1/2 tablets on Monday and Friday. Recheck in 1 week.

## 2010-09-26 NOTE — Medication Information (Signed)
Summary: rov/ewj  Anticoagulant Therapy  Managed by: Elaina Pattee, PharmD Referring MD: Charlton Haws MD Supervising MD: Daleen Squibb MD, Maisie Fus Indication 1: Aortic Valve Disorder (ICD-424.1) Lab Used: [facility] [place] Site: [address] INR POC 1.7 INR RANGE 2 - 3  Dietary changes: no    Health status changes: no    Bleeding/hemorrhagic complications: no    Recent/future hospitalizations: no    Any changes in medication regimen? no    Recent/future dental: no  Any missed doses?: no       Is patient compliant with meds? yes       Allergies: No Known Drug Allergies  Anticoagulation Management History:      The patient is taking warfarin and comes in today for a routine follow up visit.  Positive risk factors for bleeding include an age of 75 years or older.  The bleeding index is 'intermediate risk'.  Positive CHADS2 values include History of HTN.  Negative CHADS2 values include Age > 20 years old.  The start date was 06/26/2002.  His last INR was 1.6.  Anticoagulation responsible provider: Daleen Squibb MD, Maisie Fus.  INR POC: 1.7.  Cuvette Lot#: [id number].  Exp: 11/2010.    Anticoagulation Management Assessment/Plan:      The patient's current anticoagulation dose is Warfarin sodium 5 mg tabs: Use as directed by Anticoagulation Clinic.  The target INR is 2 - 3.  The next INR is due 12/30/2009.  Anticoagulation instructions were given to patient.  Results were reviewed/authorized by Elaina Pattee, PharmD.  He was notified by Elaina Pattee, PharmD.         Prior Anticoagulation Instructions: INR 3.0  Continue on same dosage 1.5 tablets daily except 2 tablets on Tuesdays, Thursdays, and Saturdays.  Recheck in 4 weeks.    Current Anticoagulation Instructions: INR 1.7. Take 2 tablets today, then take 1.5 tablets daily except 2 tablets on Tues, Thurs, Sat.

## 2010-09-26 NOTE — Medication Information (Signed)
Summary: rov/sp- pending tikosyn start  Anticoagulant Therapy  Managed by: [name], RN, BSN Referring MD: Charlton Haws MD Supervising MD: Gala Romney MD, Reuel Boom Indication 1: Aortic Valve Disorder (ICD-424.1) Lab Used: [facility] [place] Site: [address] INR POC 2.5 INR RANGE 2 - 3  Dietary changes: no    Health status changes: yes       Details: Having palpitations. Pending Tikosin start in 3 weeks.  Bleeding/hemorrhagic complications: no    Recent/future hospitalizations: no    Any changes in medication regimen? yes       Details: Carvedilol added on 9/28  Recent/future dental: yes     Details: upcoming dental cleaning, will need antibiotics pre-op  Any missed doses?: no       Is patient compliant with meds? yes       Allergies: No Known Drug Allergies  Anticoagulation Management History:      The patient is taking warfarin and comes in today for a routine follow up visit.  Positive risk factors for bleeding include an age of 75 years or older.  The bleeding index is 'intermediate risk'.  Positive CHADS2 values include History of HTN.  Negative CHADS2 values include Age > 75 years old.  The start date was 06/26/2002.  His last INR was 1.9 ratio.  Anticoagulation responsible provider: Bensimhon MD, Reuel Boom.  INR POC: 2.5.  Cuvette Lot#: [id number].  Exp: 06/2011.    Anticoagulation Management Assessment/Plan:      The patient's current anticoagulation dose is Warfarin sodium 5 mg tabs: Use as directed by Anticoagulation Clinic.  The target INR is 2 - 3.  The next INR is due 06/05/2010.  Anticoagulation instructions were given to patient.  Results were reviewed/authorized by [name], RN, BSN.  He was notified by Kalman Shan, Pharm D candidate.         Prior Anticoagulation Instructions: INR 2.3 Continue taking 2 tablets on sunday, tuesday, thursday, and saturday. Take 1.5 tablets on monday, wednesday, and friday. No changes today. See [place] in 4  weeks.  Current Anticoagulation Instructions: INR 2.5  Change dose to 2 tablets everyday except 1 1/2 on Monday and Friday. Recheck in one week.

## 2010-09-28 NOTE — Medication Information (Signed)
Summary: rov/sp  Anticoagulant Therapy  Managed by: Weston Brass, PharmD Referring MD: Charlton Haws MD PCP: Catha Gosselin Supervising MD: Jens Som MD, Arlys John Indication 1: Aortic Valve Disorder (ICD-424.1) Indication 2: Atrial Fibrillation Lab Used: [facility] [place] Site: [address] INR POC 2.5 INR RANGE 2 - 3  Dietary changes: no    Health status changes: no    Bleeding/hemorrhagic complications: no    Recent/future hospitalizations: no    Any changes in medication regimen? no    Recent/future dental: no  Any missed doses?: no       Is patient compliant with meds? yes       Allergies: No Known Drug Allergies  Anticoagulation Management History:      The patient is taking warfarin and comes in today for a routine follow up visit.  Positive risk factors for bleeding include an age of 61 years or older.  The bleeding index is 'intermediate risk'.  Positive CHADS2 values include History of HTN.  Negative CHADS2 values include Age > 55 years old.  The start date was 06/26/2002.  His last INR was 3.0 ratio.  Anticoagulation responsible provider: Jens Som MD, Arlys John.  INR POC: 2.5.  Cuvette Lot#: [id number].  Exp: 09/2011.    Anticoagulation Management Assessment/Plan:      The patient's current anticoagulation dose is Warfarin sodium 5 mg tabs: Use as directed by Anticoagulation Clinic.  The target INR is 2 - 3.  The next INR is due 09/26/2010.  Anticoagulation instructions were given to patient.  Results were reviewed/authorized by Weston Brass, PharmD.  He was notified by Weston Brass PharmD.         Prior Anticoagulation Instructions: INR 3.3  Skip today's dose of Coumadin then resume same dose of 2 tablets every day except 1 1/2 tablets on Monday and Friday.  Recheck INR in 4 weeks.   Current Anticoagulation Instructions: INR 2.5  Continue same dose of 2 tablets every day except 1 1/2 tablets on Monday and Friday.  Recheck INR in 4 weeks.

## 2010-09-28 NOTE — Progress Notes (Signed)
----   Converted from flag ---- ---- 09/11/2010 2:04 PM, [name], RN, BSN wrote: [name],  This is Dr [name] and [name]'s pt.Marland KitchenMarland KitchenDr Mccuen(eye doctor) called and needs pt off coumadin for 2-3 days for Rt eye cataract removal on 09/21/10.  She states normally she dosen't hold but she will probably place a ring and that might cause some bleeding. Fax # [phone] ------------------------------       Additional Follow-up for Phone Call Additional follow up Details #2::    will foward for dr nishan's review [name], RN  September 11, 2010 4:12 PM   Additional Follow-up for Phone Call Additional follow up Details #3:: Details for Additional Follow-up Action Taken: [facility] to stop coumadin without bridging Additional Follow-up by: Colon Branch, MD, [facility],  September 13, 2010 12:05 PM   Appended Document:  ABOVE NOTE MANUALLY FAXED TO DR Charlotte Sanes 'S OFFICE  TO FAX NUMBER  [phone]

## 2010-10-04 NOTE — Medication Information (Signed)
Summary: rov/sp  Anticoagulant Therapy  Managed by: Weston Brass, PharmD Referring MD: Charlton Haws MD PCP: Catha Gosselin Supervising MD: Myrtis Ser MD, Tinnie Gens Indication 1: Aortic Valve Disorder (ICD-424.1) Indication 2: Atrial Fibrillation Lab Used: [facility] [place] Site: [address] INR POC 2.0 INR RANGE 2 - 3  Dietary changes: no    Health status changes: no    Bleeding/hemorrhagic complications: no    Recent/future hospitalizations: yes       Details: Cataract removal on 1/26, was off Coumadin for 3 days prior, resumed 1/27  Any changes in medication regimen? no    Recent/future dental: no  Any missed doses?: yes       Is patient compliant with meds? yes      Comments: Will be having another cataract removal in the near future   Allergies: No Known Drug Allergies  Anticoagulation Management History:      Positive risk factors for bleeding include an age of 65 years or older.  The bleeding index is 'intermediate risk'.  Positive CHADS2 values include History of HTN.  Negative CHADS2 values include Age > 72 years old.  The start date was 06/26/2002.  His last INR was 3.0 ratio.  Anticoagulation responsible provider: Myrtis Ser MD, Tinnie Gens.  INR POC: 2.0.  Exp: 09/2011.    Anticoagulation Management Assessment/Plan:      The patient's current anticoagulation dose is Warfarin sodium 5 mg tabs: Use as directed by Anticoagulation Clinic.  The target INR is 2 - 3.  The next INR is due 10/24/2010.  Anticoagulation instructions were given to patient.  Results were reviewed/authorized by Weston Brass, PharmD.  He was notified by Stephannie Peters, PharmD Candidate .         Prior Anticoagulation Instructions: INR 2.5  Continue same dose of 2 tablets every day except 1 1/2 tablets on Monday and Friday.  Recheck INR in 4 weeks.   Current Anticoagulation Instructions: INR 2.0  Couamdin 5 mg tablets - Continue 2 tablet every day except 1.5 tablets on Mondays and Fridays

## 2010-10-09 ENCOUNTER — Telehealth: Payer: Self-pay | Admitting: Cardiovascular Disease

## 2010-10-18 NOTE — Progress Notes (Signed)
Summary: pt needs to go off oumadin  Phone Note Call from Patient   Caller: Patient [phone] Summary of Call: pt needs to go off coumadin tues, wed, and thurs this week for eye surgery on 10/12/10 per Dr Charlotte Sanes  Initial call taken by: Glynda Jaeger,  October 09, 2010 1:30 PM  Follow-up for Phone Call        Per D. Betsey Holiday, RN  Dr [name] cleared him in January w/o bridging thus he can stop for the other eye. Telephoned pt and made him aware.  Follow-up by: [name], RN, BSN,  October 09, 2010 2:01 PM

## 2010-10-23 ENCOUNTER — Encounter: Payer: Self-pay | Admitting: Cardiovascular Disease

## 2010-10-23 DIAGNOSIS — I359 Nonrheumatic aortic valve disorder, unspecified: Secondary | ICD-10-CM

## 2010-10-23 DIAGNOSIS — I4891 Unspecified atrial fibrillation: Secondary | ICD-10-CM

## 2010-10-23 DIAGNOSIS — Z954 Presence of other heart-valve replacement: Secondary | ICD-10-CM

## 2010-10-24 ENCOUNTER — Encounter: Payer: Self-pay | Admitting: Cardiology

## 2010-10-24 ENCOUNTER — Encounter (INDEPENDENT_AMBULATORY_CARE_PROVIDER_SITE_OTHER): Payer: Medicare Other

## 2010-10-24 DIAGNOSIS — Z7901 Long term (current) use of anticoagulants: Secondary | ICD-10-CM

## 2010-10-24 DIAGNOSIS — I359 Nonrheumatic aortic valve disorder, unspecified: Secondary | ICD-10-CM

## 2010-10-24 DIAGNOSIS — I4891 Unspecified atrial fibrillation: Secondary | ICD-10-CM

## 2010-10-24 LAB — CONVERTED CEMR LAB: POC INR: 2.6

## 2010-11-02 NOTE — Medication Information (Signed)
Summary: rov/kh  Anticoagulant Therapy  Managed by: Tammy Sours, PharmD Referring MD: Charlton Haws MD PCP: Catha Gosselin Supervising MD: Shirlee Latch MD, Roey Coopman Indication 1: Aortic Valve Disorder (ICD-424.1) Indication 2: Atrial Fibrillation Lab Used: [facility] [place] Site: [address] INR POC 2.6 INR RANGE 2 - 3  Dietary changes: no    Health status changes: no    Bleeding/hemorrhagic complications: no    Recent/future hospitalizations: no    Any changes in medication regimen? no    Recent/future dental: no  Any missed doses?: yes     Details: missed one day last week.   Is patient compliant with meds? yes       Allergies: No Known Drug Allergies  Anticoagulation Management History:      The patient is taking warfarin and comes in today for a routine follow up visit.  Positive risk factors for bleeding include an age of 75 years or older.  The bleeding index is 'intermediate risk'.  Positive CHADS2 values include History of HTN.  Negative CHADS2 values include Age > 68 years old.  The start date was 06/26/2002.  His last INR was 3.0 ratio.  Anticoagulation responsible provider: Shirlee Latch MD, Sameul Tagle.  INR POC: 2.6.  Cuvette Lot#: [id number].  Exp: 09/2011.    Anticoagulation Management Assessment/Plan:      The patient's current anticoagulation dose is Warfarin sodium 5 mg tabs: Use as directed by Anticoagulation Clinic.  The target INR is 2 - 3.  The next INR is due 11/22/2010.  Anticoagulation instructions were given to patient.  Results were reviewed/authorized by Tammy Sours, PharmD.  He was notified by Tammy Sours PharmD.         Prior Anticoagulation Instructions: INR 2.0  Couamdin 5 mg tablets - Continue 2 tablet every day except 1.5 tablets on Mondays and Fridays   Current Anticoagulation Instructions: INR 2.6   Continue to take 2 tablets everyday except 1 and 1/2 tablets on Mondays and Fridays. Recheck INR in 4 weeks.

## 2010-11-03 ENCOUNTER — Ambulatory Visit (INDEPENDENT_AMBULATORY_CARE_PROVIDER_SITE_OTHER): Payer: Medicare Other | Admitting: Internal Medicine

## 2010-11-03 ENCOUNTER — Encounter: Payer: Self-pay | Admitting: Internal Medicine

## 2010-11-03 DIAGNOSIS — I4891 Unspecified atrial fibrillation: Secondary | ICD-10-CM

## 2010-11-03 DIAGNOSIS — I5022 Chronic systolic (congestive) heart failure: Secondary | ICD-10-CM

## 2010-11-03 DIAGNOSIS — I10 Essential (primary) hypertension: Secondary | ICD-10-CM

## 2010-11-03 DIAGNOSIS — I5032 Chronic diastolic (congestive) heart failure: Secondary | ICD-10-CM | POA: Insufficient documentation

## 2010-11-07 NOTE — Assessment & Plan Note (Signed)
Summary: 3 month rov/sl   Visit Type:  Follow-up Primary Provider:  Catha Gosselin  CC:  Sob.  History of Present Illness: Mr. [name] returns today for followup of atrial fibrillation.  The patient underwent CABG/AVR many years ago.  He has had increasingly frequent episodes of atrial fib and is now permanent having failed tikosyn.  He is improved.  He has not had frank syncope. He does note some peripheral edema. His dyspnea is better, now class 2 and his rate appears to be well controlled.  Current Medications (verified): 1)  Lipitor 40 Mg Tabs (Atorvastatin Calcium) .... Take 1 Tablet Once A Day 2)  Nitroglycerin 0.4 Mg Subl (Nitroglycerin) .... As Needed 3)  Ramipril 10 Mg Caps (Ramipril) .[name].. 1 Tab By Mouth Two Times A Day 4)  Torsemide 10 Mg Tabs (Torsemide) .... Take 2 Tabs Daily 5)  Klor-Con 8 Meq Cr-Tabs (Potassium Chloride) .[name].. 1 Tablet Once Daily 6)  Metoclopramide Hcl 10 Mg Tabs (Metoclopramide Hcl) .... Two Times A Day 7)  Tylenol Extra Strength 500 Mg Tabs (Acetaminophen) .... Prn 8)  Warfarin Sodium 5 Mg Tabs (Warfarin Sodium) .... Use As Directed By Anticoagulation Clinic 9)  Flomax 0.4 Mg Caps (Tamsulosin Hcl) .... Take 1 Capsule Daily 10)  Fish Oil   Oil (Fish Oil) .[name].. 1 Tab By Mouth Once Daily 11)  Multivitamins   Tabs (Multiple Vitamin) .[name].. 1 Tab By Mouth Once Daily 12)  Vitamin B-12 1000 Mcg Tabs (Cyanocobalamin) .... Take 1 Tablet By Mouth Once A Day 13)  Carvedilol 12.5 Mg Tabs (Carvedilol) .... Take 1 1/2 Tablet By Mouth Twice A Day 14)  Allegra Allergy 180 Mg Tabs (Fexofenadine Hcl) .... As Needed 15)  Calcium Antacid 500 Mg Chew (Calcium Carbonate Antacid) .... Uad 16)  Digoxin 0.125 Mg Tabs (Digoxin) .... Take One Tablet By Mouth Daily  Allergies (verified): No Known Drug Allergies  Past History:  Past Medical History: Last updated: 01/21/2009 ALTERED MENTAL STATUS (ICD-780.97) HYPOKALEMIA (ICD-276.8) ENCOUNTER FOR LONG-TERM USE OF ANTICOAGULANTS  (ICD-V58.61) HYPERLIPIDEMIA (ICD-272.4) ATRIAL FIBRILLATION (ICD-427.31) CAD (ICD-414.00) AORTIC VALVE REPLACEMENT, HX OF (ICD-V43.3) HYPERTENSION (ICD-401.9)  Past Surgical History: Last updated: 01/21/2009 CABG/AVR:  2003 Thoracic T12 compression fracture  Review of Systems       The patient complains of dyspnea on exertion and peripheral edema.  The patient denies chest pain and syncope.    Vital Signs:  Patient profile:   77 year old male Height:      76 inches Weight:      295 pounds BMI:     36.04 Pulse rate:   70 / minute Resp:     18 per minute BP sitting:   145 / 72  (right arm) Cuff size:   large  Vitals Entered By: Vikki Ports (November 03, 2010 9:51 AM)  Physical Exam  General:  Affect appropriate Healthy:  appears stated age HEENT: normal Neck supple with no adenopathy JVP normal no bruits no thyromegaly Lungs clear with no wheezing and good diaphragmatic motion Heart:  S1/S2 systolic murmur,rub, gallop or click PMI normal. IRIR Abdomen: benighn, BS positve, no tenderness, no AAA no bruit.  No HSM or HJR Distal pulses intact with no bruits Plus one bilateral edema Neuro non-focal Skin warm and dry    EKG  Procedure date:  11/03/2010  Findings:      Atrial fibrillation with a controlled ventricular response rate of: 71  Impression & Recommendations:  Problem # 1:  ATRIAL FIBRILLATION (ICD-427.31) His ventricular  rate appears to be well controlled. He will continue with a strategy of rate control. His updated medication list for this problem includes:    Warfarin Sodium 5 Mg Tabs (Warfarin sodium) ..... Use as directed by anticoagulation clinic    Carvedilol 12.5 Mg Tabs (Carvedilol) .[name]... Take 1 1/2 tablet by mouth twice a day    Digoxin 0.125 Mg Tabs (Digoxin) .[name]... Take one tablet by mouth daily  Problem # 2:  CHRONIC DIASTOLIC HEART FAILURE (ICD-428.32) His symptoms are well controlled. He will continue meds as below and maintain a low  sodium diet. His updated medication list for this problem includes:    Nitroglycerin 0.4 Mg Subl (Nitroglycerin) .[name]... As needed    Ramipril 10 Mg Caps (Ramipril) .[name]... 1 tab by mouth two times a day    Torsemide 10 Mg Tabs (Torsemide) .[name]... Take 2 tabs daily    Warfarin Sodium 5 Mg Tabs (Warfarin sodium) ..... Use as directed by anticoagulation clinic    Carvedilol 12.5 Mg Tabs (Carvedilol) .[name]... Take 1 1/2 tablet by mouth twice a day    Digoxin 0.125 Mg Tabs (Digoxin) .[name]... Take one tablet by mouth daily

## 2010-11-08 LAB — BASIC METABOLIC PANEL
BUN: 14 mg/dL (ref 6–23)
BUN: 16 mg/dL (ref 6–23)
BUN: 19 mg/dL (ref 6–23)
CO2: 26 mEq/L (ref 19–32)
CO2: 30 mEq/L (ref 19–32)
Calcium: 9 mg/dL (ref 8.4–10.5)
Calcium: 9.2 mg/dL (ref 8.4–10.5)
Calcium: 9.4 mg/dL (ref 8.4–10.5)
Calcium: 9.4 mg/dL (ref 8.4–10.5)
Creatinine, Ser: 1.07 mg/dL (ref 0.4–1.5)
GFR calc Af Amer: >60 mL/min (ref >60)
GFR calc non Af Amer: 54 mL/min — ABNORMAL LOW (ref >60)
GFR calc non Af Amer: >60 mL/min (ref >60)
GFR calc non Af Amer: >60 mL/min (ref >60)
GFR calc non Af Amer: >60 mL/min (ref >60)
Glucose, Bld: 112 mg/dL — ABNORMAL HIGH (ref 70–99)
Glucose, Bld: 117 mg/dL — ABNORMAL HIGH (ref 70–99)
Potassium: 3.8 mEq/L (ref 3.5–5.1)
Potassium: 4 mEq/L (ref 3.5–5.1)
Potassium: 4.4 mEq/L (ref 3.5–5.1)
Sodium: 137 mEq/L (ref 135–145)
Sodium: 139 mEq/L (ref 135–145)

## 2010-11-08 LAB — PROTIME-INR
INR: 2.5 — ABNORMAL HIGH (ref 0.00–1.49)
INR: 2.55 — ABNORMAL HIGH (ref 0.00–1.49)
Prothrombin Time: 25.4 seconds — ABNORMAL HIGH (ref 11.6–15.2)
Prothrombin Time: 26.5 seconds — ABNORMAL HIGH (ref 11.6–15.2)
Prothrombin Time: 27.5 seconds — ABNORMAL HIGH (ref 11.6–15.2)

## 2010-11-08 LAB — CBC
HCT: 38.4 % — ABNORMAL LOW (ref 39.0–52.0)
Hemoglobin: 13.1 g/dL (ref 13.0–17.0)
MCH: 29.1 pg (ref 26.0–34.0)
MCHC: 34.1 g/dL (ref 30.0–36.0)
RDW: 12.4 % (ref 11.5–15.5)

## 2010-11-08 LAB — MAGNESIUM
Magnesium: 1.9 mg/dL (ref 1.5–2.5)
Magnesium: 2 mg/dL (ref 1.5–2.5)
Magnesium: 2.1 mg/dL (ref 1.5–2.5)

## 2010-11-22 ENCOUNTER — Ambulatory Visit (INDEPENDENT_AMBULATORY_CARE_PROVIDER_SITE_OTHER): Payer: Medicare Other | Admitting: *Deleted

## 2010-11-22 DIAGNOSIS — I359 Nonrheumatic aortic valve disorder, unspecified: Secondary | ICD-10-CM

## 2010-11-22 DIAGNOSIS — Z954 Presence of other heart-valve replacement: Secondary | ICD-10-CM

## 2010-11-22 DIAGNOSIS — I4891 Unspecified atrial fibrillation: Secondary | ICD-10-CM

## 2010-11-22 DIAGNOSIS — Z7901 Long term (current) use of anticoagulants: Secondary | ICD-10-CM | POA: Insufficient documentation

## 2010-11-22 NOTE — Patient Instructions (Signed)
INR 3.1 Continue taking 2 tablets (10mg ) daily, except take 1 1/2 tablets (7.5 mg) Mondays and Fridays. Recheck in 4 weeks.

## 2010-12-02 ENCOUNTER — Other Ambulatory Visit: Payer: Self-pay | Admitting: Cardiovascular Disease

## 2010-12-02 LAB — CBC
HCT: 37.5 % — ABNORMAL LOW (ref 39.0–52.0)
HCT: 39.5 % (ref 39.0–52.0)
Hemoglobin: 13.4 g/dL (ref 13.0–17.0)
MCHC: 33.9 g/dL (ref 30.0–36.0)
MCV: 88.1 fL (ref 78.0–100.0)
Platelets: 185 10*3/uL (ref 150–400)
RBC: 4.48 MIL/uL (ref 4.22–5.81)
RDW: 13 % (ref 11.5–15.5)
RDW: 13 % (ref 11.5–15.5)

## 2010-12-02 LAB — BASIC METABOLIC PANEL
BUN: 12 mg/dL (ref 6–23)
CO2: 26 mEq/L (ref 19–32)
Calcium: 9.1 mg/dL (ref 8.4–10.5)
Chloride: 98 mEq/L (ref 96–112)
GFR calc Af Amer: >60 mL/min (ref >60)
GFR calc non Af Amer: >60 mL/min (ref >60)
Glucose, Bld: 114 mg/dL — ABNORMAL HIGH (ref 70–99)
Glucose, Bld: 118 mg/dL — ABNORMAL HIGH (ref 70–99)
Sodium: 131 mEq/L — ABNORMAL LOW (ref 135–145)

## 2010-12-02 LAB — PROTIME-INR
INR: 0.9 (ref 0.00–1.49)
INR: 1.7 — ABNORMAL HIGH (ref 0.00–1.49)
Prothrombin Time: 12.2 seconds (ref 11.6–15.2)
Prothrombin Time: 12.3 seconds (ref 11.6–15.2)

## 2010-12-02 LAB — CROSSMATCH: Antibody Screen: NEGATIVE

## 2010-12-02 LAB — ABO/RH: ABO/RH(D): O POS

## 2010-12-05 LAB — BASIC METABOLIC PANEL
BUN: 12 mg/dL (ref 6–23)
BUN: 9 mg/dL (ref 6–23)
Calcium: 8.6 mg/dL (ref 8.4–10.5)
Calcium: 9 mg/dL (ref 8.4–10.5)
Creatinine, Ser: 0.8 mg/dL (ref 0.4–1.5)
GFR calc non Af Amer: >60 mL/min (ref >60)
GFR calc non Af Amer: >60 mL/min (ref >60)
Glucose, Bld: 111 mg/dL — ABNORMAL HIGH (ref 70–99)
Potassium: 3.2 mEq/L — ABNORMAL LOW (ref 3.5–5.1)
Potassium: 3.6 mEq/L (ref 3.5–5.1)
Sodium: 133 mEq/L — ABNORMAL LOW (ref 135–145)

## 2010-12-05 LAB — CULTURE, BLOOD (ROUTINE X 2)
Culture: NO GROWTH
Culture: NO GROWTH

## 2010-12-05 LAB — COMPREHENSIVE METABOLIC PANEL
ALT: 22 U/L (ref 0–53)
AST: 20 U/L (ref 0–37)
Albumin: 3.6 g/dL (ref 3.5–5.2)
Alkaline Phosphatase: 82 U/L (ref 39–117)
Chloride: 101 mEq/L (ref 96–112)
GFR calc Af Amer: >60 mL/min (ref >60)
Potassium: 3.4 mEq/L — ABNORMAL LOW (ref 3.5–5.1)
Sodium: 133 mEq/L — ABNORMAL LOW (ref 135–145)
Total Bilirubin: 0.9 mg/dL (ref 0.3–1.2)

## 2010-12-05 LAB — DIFFERENTIAL
Basophils Absolute: 0 10*3/uL (ref 0.0–0.1)
Basophils Absolute: 0 10*3/uL (ref 0.0–0.1)
Basophils Relative: 0 % (ref 0–1)
Eosinophils Absolute: 0 K/uL (ref 0.0–0.7)
Eosinophils Relative: 0 % (ref 0–5)
Lymphocytes Relative: 14 % (ref 12–46)
Lymphocytes Relative: 34 % (ref 12–46)
Lymphs Abs: 1.5 K/uL (ref 0.7–4.0)
Lymphs Abs: 2.3 10*3/uL (ref 0.7–4.0)
Monocytes Absolute: 0.5 10*3/uL (ref 0.1–1.0)
Monocytes Relative: 5 % (ref 3–12)
Neutro Abs: 3.8 10*3/uL (ref 1.7–7.7)
Neutro Abs: 8.8 K/uL — ABNORMAL HIGH (ref 1.7–7.7)
Neutrophils Relative %: 57 % (ref 43–77)
Neutrophils Relative %: 81 % — ABNORMAL HIGH (ref 43–77)

## 2010-12-05 LAB — PROTIME-INR
INR: 1.8 — ABNORMAL HIGH (ref 0.00–1.49)
INR: 1.9 — ABNORMAL HIGH (ref 0.00–1.49)
INR: 2.1 — ABNORMAL HIGH (ref 0.00–1.49)
Prothrombin Time: 22.5 seconds — ABNORMAL HIGH (ref 11.6–15.2)
Prothrombin Time: 25.1 seconds — ABNORMAL HIGH (ref 11.6–15.2)

## 2010-12-05 LAB — CBC
HCT: 35.4 % — ABNORMAL LOW (ref 39.0–52.0)
HCT: 35.7 % — ABNORMAL LOW (ref 39.0–52.0)
HCT: 38.6 % — ABNORMAL LOW (ref 39.0–52.0)
Hemoglobin: 12.4 g/dL — ABNORMAL LOW (ref 13.0–17.0)
Hemoglobin: 13.6 g/dL (ref 13.0–17.0)
MCHC: 35.1 g/dL (ref 30.0–36.0)
MCV: 87.6 fL (ref 78.0–100.0)
Platelets: 160 10*3/uL (ref 150–400)
Platelets: 170 10*3/uL (ref 150–400)
Platelets: 180 10*3/uL (ref 150–400)
RBC: 4.41 MIL/uL (ref 4.22–5.81)
RDW: 12.5 % (ref 11.5–15.5)
RDW: 12.6 % (ref 11.5–15.5)
RDW: 12.9 % (ref 11.5–15.5)
WBC: 10.9 10*3/uL — ABNORMAL HIGH (ref 4.0–10.5)
WBC: 6.6 10*3/uL (ref 4.0–10.5)
WBC: 9 10*3/uL (ref 4.0–10.5)

## 2010-12-05 LAB — COMPREHENSIVE METABOLIC PANEL WITH GFR
BUN: 14 mg/dL (ref 6–23)
CO2: 24 meq/L (ref 19–32)
Calcium: 8.9 mg/dL (ref 8.4–10.5)
Creatinine, Ser: 0.82 mg/dL (ref 0.4–1.5)
GFR calc non Af Amer: >60 mL/min (ref >60)
Glucose, Bld: 114 mg/dL — ABNORMAL HIGH (ref 70–99)
Total Protein: 5.9 g/dL — ABNORMAL LOW (ref 6.0–8.3)

## 2010-12-05 LAB — URINALYSIS, ROUTINE W REFLEX MICROSCOPIC
Bilirubin Urine: NEGATIVE
Glucose, UA: NEGATIVE mg/dL
Hgb urine dipstick: NEGATIVE
Ketones, ur: NEGATIVE mg/dL
Nitrite: NEGATIVE
Protein, ur: NEGATIVE mg/dL
Specific Gravity, Urine: 1.027 (ref 1.005–1.030)
Urobilinogen, UA: 0.2 mg/dL (ref 0.0–1.0)
pH: 6 (ref 5.0–8.0)

## 2010-12-05 LAB — LACTIC ACID, PLASMA: Lactic Acid, Venous: 1.3 mmol/L (ref 0.5–2.2)

## 2010-12-05 LAB — URINE CULTURE: Colony Count: 8000

## 2010-12-05 LAB — DIGOXIN LEVEL: Digoxin Level: <0.2 ng/mL — ABNORMAL LOW (ref 0.8–2.0)

## 2010-12-11 LAB — CBC
HCT: 37 % — ABNORMAL LOW (ref 39.0–52.0)
HCT: 37.4 % — ABNORMAL LOW (ref 39.0–52.0)
HCT: 38 % — ABNORMAL LOW (ref 39.0–52.0)
HCT: 39.2 % (ref 39.0–52.0)
Hemoglobin: 12.7 g/dL — ABNORMAL LOW (ref 13.0–17.0)
Hemoglobin: 12.7 g/dL — ABNORMAL LOW (ref 13.0–17.0)
Hemoglobin: 12.7 g/dL — ABNORMAL LOW (ref 13.0–17.0)
Hemoglobin: 12.8 g/dL — ABNORMAL LOW (ref 13.0–17.0)
Hemoglobin: 13.2 g/dL (ref 13.0–17.0)
Hemoglobin: 13.2 g/dL (ref 13.0–17.0)
Hemoglobin: 13.3 g/dL (ref 13.0–17.0)
MCHC: 33.5 g/dL (ref 30.0–36.0)
MCHC: 33.8 g/dL (ref 30.0–36.0)
MCHC: 34.2 g/dL (ref 30.0–36.0)
MCHC: 34.9 g/dL (ref 30.0–36.0)
MCV: 84.8 fL (ref 78.0–100.0)
MCV: 86 fL (ref 78.0–100.0)
MCV: 86.6 fL (ref 78.0–100.0)
MCV: 86.9 fL (ref 78.0–100.0)
MCV: 88.1 fL (ref 78.0–100.0)
Platelets: 263 10*3/uL (ref 150–400)
RBC: 4.31 MIL/uL (ref 4.22–5.81)
RBC: 4.32 MIL/uL (ref 4.22–5.81)
RBC: 4.34 MIL/uL (ref 4.22–5.81)
RBC: 4.36 MIL/uL (ref 4.22–5.81)
RBC: 4.49 MIL/uL (ref 4.22–5.81)
RDW: 12.3 % (ref 11.5–15.5)
RDW: 12.6 % (ref 11.5–15.5)
WBC: 7.6 10*3/uL (ref 4.0–10.5)
WBC: 7.7 10*3/uL (ref 4.0–10.5)
WBC: 7.8 10*3/uL (ref 4.0–10.5)
WBC: 8.8 10*3/uL (ref 4.0–10.5)
WBC: 9.5 10*3/uL (ref 4.0–10.5)

## 2010-12-11 LAB — BASIC METABOLIC PANEL
BUN: 8 mg/dL (ref 6–23)
CO2: 25 mEq/L (ref 19–32)
CO2: 26 mEq/L (ref 19–32)
CO2: 26 mEq/L (ref 19–32)
CO2: 29 mEq/L (ref 19–32)
Calcium: 8.9 mg/dL (ref 8.4–10.5)
Calcium: 9.2 mg/dL (ref 8.4–10.5)
Chloride: 96 mEq/L (ref 96–112)
Chloride: 98 mEq/L (ref 96–112)
Chloride: 99 mEq/L (ref 96–112)
Creatinine, Ser: 0.74 mg/dL (ref 0.4–1.5)
GFR calc Af Amer: >60 mL/min (ref >60)
GFR calc Af Amer: >60 mL/min (ref >60)
GFR calc non Af Amer: >60 mL/min (ref >60)
Glucose, Bld: 108 mg/dL — ABNORMAL HIGH (ref 70–99)
Glucose, Bld: 121 mg/dL — ABNORMAL HIGH (ref 70–99)
Potassium: 3 mEq/L — ABNORMAL LOW (ref 3.5–5.1)
Potassium: 3.4 mEq/L — ABNORMAL LOW (ref 3.5–5.1)
Sodium: 131 mEq/L — ABNORMAL LOW (ref 135–145)
Sodium: 133 mEq/L — ABNORMAL LOW (ref 135–145)
Sodium: 133 mEq/L — ABNORMAL LOW (ref 135–145)
Sodium: 133 mEq/L — ABNORMAL LOW (ref 135–145)

## 2010-12-11 LAB — COMPREHENSIVE METABOLIC PANEL
ALT: 30 U/L (ref 0–53)
AST: 26 U/L (ref 0–37)
Alkaline Phosphatase: 105 U/L (ref 39–117)
Alkaline Phosphatase: 96 U/L (ref 39–117)
BUN: 13 mg/dL (ref 6–23)
CO2: 25 mEq/L (ref 19–32)
Calcium: 8.7 mg/dL (ref 8.4–10.5)
Chloride: 102 mEq/L (ref 96–112)
Creatinine, Ser: 0.98 mg/dL (ref 0.4–1.5)
GFR calc Af Amer: >60 mL/min (ref >60)
GFR calc non Af Amer: >60 mL/min (ref >60)
Glucose, Bld: 106 mg/dL — ABNORMAL HIGH (ref 70–99)
Glucose, Bld: 125 mg/dL — ABNORMAL HIGH (ref 70–99)
Potassium: 3.8 mEq/L (ref 3.5–5.1)
Potassium: 5.4 mEq/L — ABNORMAL HIGH (ref 3.5–5.1)
Sodium: 134 mEq/L — ABNORMAL LOW (ref 135–145)
Total Bilirubin: 1 mg/dL (ref 0.3–1.2)
Total Protein: 6.3 g/dL (ref 6.0–8.3)

## 2010-12-11 LAB — PROTIME-INR
INR: 1.2 (ref 0.00–1.49)
INR: 2 — ABNORMAL HIGH (ref 0.00–1.49)
INR: 2.4 — ABNORMAL HIGH (ref 0.00–1.49)
INR: 2.5 — ABNORMAL HIGH (ref 0.00–1.49)
Prothrombin Time: 15 seconds (ref 11.6–15.2)
Prothrombin Time: 15.1 seconds (ref 11.6–15.2)
Prothrombin Time: 15.3 seconds — ABNORMAL HIGH (ref 11.6–15.2)
Prothrombin Time: 18 seconds — ABNORMAL HIGH (ref 11.6–15.2)
Prothrombin Time: 18.6 seconds — ABNORMAL HIGH (ref 11.6–15.2)
Prothrombin Time: 28.2 seconds — ABNORMAL HIGH (ref 11.6–15.2)
Prothrombin Time: 28.4 seconds — ABNORMAL HIGH (ref 11.6–15.2)
Prothrombin Time: 30.7 seconds — ABNORMAL HIGH (ref 11.6–15.2)

## 2010-12-11 LAB — BLOOD GAS, ARTERIAL
Acid-Base Excess: 0.8 mmol/L (ref 0.0–2.0)
Drawn by: 275531
O2 Content: 2 L/min
O2 Saturation: 92.9 %
TCO2: 25.7 mmol/L (ref 0–100)
pO2, Arterial: 70.9 mmHg — ABNORMAL LOW (ref 80.0–100.0)

## 2010-12-11 LAB — APTT: aPTT: 64 seconds — ABNORMAL HIGH (ref 24–37)

## 2010-12-11 LAB — URINE CULTURE: Special Requests: NEGATIVE

## 2010-12-11 LAB — DIFFERENTIAL
Basophils Absolute: 0.1 10*3/uL (ref 0.0–0.1)
Basophils Relative: 1 % (ref 0–1)
Lymphocytes Relative: 33 % (ref 12–46)
Neutro Abs: 4.3 10*3/uL (ref 1.7–7.7)
Neutrophils Relative %: 56 % (ref 43–77)

## 2010-12-11 LAB — URINALYSIS, ROUTINE W REFLEX MICROSCOPIC
Bilirubin Urine: NEGATIVE
Hgb urine dipstick: NEGATIVE
Nitrite: NEGATIVE
Protein, ur: NEGATIVE mg/dL
Specific Gravity, Urine: 1.022 (ref 1.005–1.030)
Urobilinogen, UA: 1 mg/dL (ref 0.0–1.0)

## 2010-12-11 LAB — HEPARIN LEVEL (UNFRACTIONATED)
Heparin Unfractionated: 0.3 IU/mL (ref 0.30–0.70)
Heparin Unfractionated: 0.34 IU/mL (ref 0.30–0.70)
Heparin Unfractionated: 0.36 IU/mL (ref 0.30–0.70)
Heparin Unfractionated: 0.48 IU/mL (ref 0.30–0.70)

## 2010-12-16 ENCOUNTER — Encounter: Payer: Self-pay | Admitting: Cardiovascular Disease

## 2010-12-16 ENCOUNTER — Encounter: Payer: Self-pay | Admitting: *Deleted

## 2010-12-19 ENCOUNTER — Ambulatory Visit (INDEPENDENT_AMBULATORY_CARE_PROVIDER_SITE_OTHER): Payer: Medicare Other | Admitting: *Deleted

## 2010-12-19 ENCOUNTER — Ambulatory Visit (INDEPENDENT_AMBULATORY_CARE_PROVIDER_SITE_OTHER): Payer: Medicare Other | Admitting: Cardiovascular Disease

## 2010-12-19 ENCOUNTER — Encounter: Payer: Self-pay | Admitting: Cardiovascular Disease

## 2010-12-19 DIAGNOSIS — I2510 Atherosclerotic heart disease of native coronary artery without angina pectoris: Secondary | ICD-10-CM

## 2010-12-19 DIAGNOSIS — I359 Nonrheumatic aortic valve disorder, unspecified: Secondary | ICD-10-CM

## 2010-12-19 DIAGNOSIS — E785 Hyperlipidemia, unspecified: Secondary | ICD-10-CM

## 2010-12-19 DIAGNOSIS — Z954 Presence of other heart-valve replacement: Secondary | ICD-10-CM

## 2010-12-19 DIAGNOSIS — I4891 Unspecified atrial fibrillation: Secondary | ICD-10-CM

## 2010-12-19 LAB — POCT INR: INR: 3.4

## 2010-12-19 MED ORDER — CARVEDILOL 25 MG PO TABS: 25.0000 mg | ORAL_TABLET | Freq: Two times a day (BID) | ORAL | Status: DC

## 2010-12-19 NOTE — Assessment & Plan Note (Signed)
Increase coreg for better rate control.  Continue coumadin  Refer [name] to discuss afib ablation

## 2010-12-19 NOTE — Patient Instructions (Signed)
Your physician recommends that you schedule a follow-up appointment in: 6 MONTHS  REFERRAL TO DR Johney Frame TO DISCUSS ATRIAL FIB ABLATION  INCREASE CARVEDILOL 25 MG TWICE DAILY

## 2010-12-19 NOTE — Assessment & Plan Note (Signed)
Cholesterol is at goal.  Continue current dose of statin and diet Rx.  No myalgias or side effects.  F/U  LFT's in 6 months. No results found for this basename: LDLCALC             

## 2010-12-19 NOTE — Progress Notes (Signed)
[name] returns today for followup of atrial fibrillation.  The patient underwent CABG/AVR many years ago.  He has had increasingly frequent episodes of atrial fib and is now persistent.  We had him admitted for Tikosyn loading and he went back to NSR but then back to atrial fib.  His QT was long and we ultimately decided to stop his Tikosyn and increase his  carvedilol.  He is improved.  He has not had frank syncope. He does note some peripheral edema. His wife who is with him today states that dyspnea is better, now class 2.  Will increase coreg for better rate control.  F/U with Dr Johney Frame to explore afib ablation  ROS: Denies fever, malais, weight loss, blurry vision, decreased visual acuity, cough, sputum, SOB, hemoptysis, pleuritic pain, palpitaitons, heartburn, abdominal pain, melena, lower extremity edema, claudication, or rash.   General: Affect appropriate Healthy:  appears stated age HEENT: normal Neck supple with no adenopathy JVP normal no bruits no thyromegaly Lungs clear with no wheezing and good diaphragmatic motion Heart:  S1/S2 click SEM   murmur,rub, gallop or click PMI normal Abdomen: benighn, BS positve, no tenderness, no AAA no bruit.  No HSM or HJR Distal pulses intact with no bruits No edema Neuro non-focal Skin warm and dry No muscular weakness   Current Outpatient Prescriptions  Medication Sig Dispense Refill  . acetaminophen (TYLENOL) 325 MG tablet 2 tabs po bid       . amoxicillin (AMOXIL) 500 MG capsule Take 500 mg by mouth as needed. Dental       . atorvastatin (LIPITOR) 40 MG tablet Take 40 mg by mouth daily.        . carvedilol (COREG) 12.5 MG tablet Take 12.5 mg by mouth 2 (two) times daily with a meal.        . digoxin (LANOXIN) 0.125 MG tablet Take 125 mcg by mouth daily.        . fish oil-omega-3 fatty acids 1000 MG capsule 2 tabs po qd       . metoCLOPramide (REGLAN) 10 MG tablet Take 10 mg by mouth 4 (four) times daily.        . Multiple Vitamin  (MULTIVITAMIN) tablet Take 1 tablet by mouth daily.        . nitroGLYCERIN (NITROSTAT) 0.4 MG SL tablet Place 0.4 mg under the tongue every 5 (five) minutes as needed.        . potassium chloride (KLOR-CON) 8 MEQ CR tablet Take 8 mEq by mouth daily.        . ramipril (ALTACE) 10 MG capsule Take 10 mg by mouth daily.        . Tamsulosin HCl (FLOMAX) 0.4 MG CAPS Take by mouth.        . torsemide (DEMADEX) 10 MG tablet Take 10 mg by mouth daily.        . vitamin B-12 (CYANOCOBALAMIN) 1000 MCG tablet Take 1,000 mcg by mouth daily.        [name] warfarin (COUMADIN) 5 MG tablet Take 1 tablet (5 mg total) by mouth as directed.  180 tablet  1  . DISCONTD: Calcium Carbonate-Vitamin D (CALCIUM 500 + D PO) 1 tab po qd       . DISCONTD: fexofenadine (ALLEGRA) 180 MG tablet Take 180 mg by mouth daily.          Allergies  Review of patient's allergies indicates no known allergies.  Electrocardiogram:  Assessment and Plan

## 2010-12-19 NOTE — Assessment & Plan Note (Signed)
Normal valve sounds no AR.  Continue coumadin and SBE prophylaxis

## 2010-12-19 NOTE — Assessment & Plan Note (Signed)
Stable with no angina and good activity level.  Continue medical Rx  

## 2010-12-20 ENCOUNTER — Encounter: Payer: Medicare Other | Admitting: *Deleted

## 2011-01-08 ENCOUNTER — Other Ambulatory Visit: Payer: Self-pay | Admitting: Cardiovascular Disease

## 2011-01-09 NOTE — Discharge Summary (Signed)
NAMETRASK, VOSLER               ACCOUNT NO.:  [id number]   MEDICAL RECORD NO.:  [id number]          PATIENT TYPE:  INP   LOCATION:  3715                         FACILITY:  [facility]   PHYSICIAN:  Hollice Espy, M.D.DATE OF BIRTH:  November 14, 1933   DATE OF ADMISSION:  08/23/2008  DATE OF DISCHARGE:                               DISCHARGE SUMMARY   ATTENDING PHYSICIAN:  Hollice Espy, M.D.   CONSULTANT:  [name]. [name], MD, [facility], cardiology.   PRIMARY CARE PHYSICIAN:  Caryn Bee L. Little, M.D.   DISCHARGE DIAGNOSES:  1. Fall.  2. Secondary T12 compression fracture.  3. Status post kyphoplasty.  4. Altered mental status felt to be secondary to hospital psychosis      and medication.  Please note that the patient's altered mental      status has now resolved.  5. Atrial fibrillation with rapid ventricular rate.  6. History of hypertension.  7. Morbid obesity.  8. Muscle weakness and deconditioning.  9. Gastroesophageal reflux disease.   DISCHARGE MEDICATIONS:  The patient is being transferred to the  inpatient rehab unit.  He will continue all his previous medications.  They are as follows:  1. Norvasc 10.  2. Colace 100 p.o. b.i.d.  3. Dulcolax p.r.n.  4. Lovenox 125 subcu q.12 hours.  This medication will be continued      until the patient's INR is therapeutic, Coumadin per pharmacy      protocol.  5. HCTZ 25 mg p.o. daily.  6. Lopressor 12.5 p.o. b.i.d.  7. Protonix 40 p.o. daily.  8. Altace 10 p.o. b.i.d.  9. Crestor 20 p.o. daily.  10.Betapace 80 mg p.o. b.i.d.  11.Tylenol p.r.n.   ACTIVITY:  Will be as per inpatient rehab service.   DISCHARGE DIET:  Will be a heart-healthy and caffeine free diet.   HOSPITAL COURSE:  The patient is a 77 year old white male with past  medical history of atrial fibrillation and hypertension who presented  after a fall with severe lower back pain.  He was found to have a T12  compression fracture and he was admitted for  stabilization and  kyphoplasty.  Soon after admission by hospital day #2 the patient was  starting to have problems with confusion.  At times he would be highly  somnolent.  He slept very poorly oftentimes only sleeping an hour or two  at night, very restless and then during the day he was quite drowsy,  oftentimes drifting in and out of sleep even during conversation.  This  continued to progress.  A CT scan of the head showed no evidence of any  acute CVA and it was felt more that this was secondary to hospital  psychosis secondary to poor sleep equally as well as a combination too  of narcotic medications and muscle relaxers.  The patient is on Coumadin  for his atrial fibrillation.  This medicine was put on hold.  He was put  on IV heparin which could be stopped prior to procedure.  The patient  was evaluated by interventional radiology who felt that he was a  candidate  for kyphoplasty and the patient underwent kyphoplasty on  August 30, 2008.  It was done without complication and postprocedure the  patient was returned back to his room.  Initially he continued to have  problems with drowsiness.  However, after kyphoplasty by January 5 and  January 6 all of his narcotic medications, Reglan, Ultram and muscle  relaxers were all on hold and with these medications being on hold as  well as the anesthesia completely wearing off by January 6 the patient  was much more fully alert and oriented, looking at his baseline.  He was  restarted back on Coumadin and Lovenox and the plan initially was for  the patient to go back home given the fact he was fully alert.  However,  when he was attempted to get out of bed he was quite unsteady.  PT/OT  evaluations were done and the patient remained unsteady without enough  progress to feel that he would be safely able to manage at home with  home health PT/OT.  The patient was evaluated and accepted to the  inpatient rehab service on September 03, 2008.  He  will be transferred  there after a bed is available which is expected to be later on today,  September 03, 2008.   The patient's overall disposition is improved and then post he will  continue on Lovenox and Coumadin until his INR is therapeutic at which  time Lovenox can be discontinued.  His other medical issues were stable  during this hospitalization.  The plan will be for the patient to follow  up with his PCP, Dr. Catha Gosselin, 1-2 weeks post discharge from rehab  service and Dr. Charlton Haws in 1-2 weeks post discharge.      Hollice Espy, M.D.  Electronically Signed     SKK/MEDQ  D:  09/03/2008  T:  09/03/2008  Job:  [id number]   cc:   Caryn Bee L. Little, M.D.  [name]. [name], MD, [facility]

## 2011-01-09 NOTE — Assessment & Plan Note (Signed)
[facility]                            CARDIOLOGY OFFICE NOTE   NAME:[name], [name]                      MRN:          [id number]  DATE:04/14/2008                            DOB:          Mar 11, 1934    HISTORY OF PRESENT ILLNESS:  A 77 year old patient with previous history  of aortic valve replacement.   [name] is doing well.  Unfortunately, he continues to be significantly  overweight.  He has recently had his blood pressure medicines adjusted  by myself and Dr. Clarene Duke.  His blood pressures are borderline  controlled, but I think they are adequate at this time.  His systolic  pressures were generally running 135-150 and his diastolics are low,  generally in the 60-70 range.  I talked to [facility] at length.  He really  needs to try to increase his activity levels.  His weight is at 300  pounds.   He does not have any willpower at the table and also has significant  caloric intake.   He has some problems with his knees, which seems to limit his  exercising.  However, I told Bennett that he certainly could get into  pool.  He could probably freewheel a bicycle.   In regards to control of his blood pressure and also recent therapy for  his reflux, Aslan would be much better off if he could lose 10-20  pounds.   After reading Dr. Fredirick Maudlin note, my gut feeling was to not change his  blood pressure medicine, but to force [name] to try to increase his  activity levels and lose some weight.  From a cardiac perspective, he is  stable.  His Coumadin levels have been therapeutic.  His overall LV  function is normal at a 65% by echo on October 09, 2007, mean gradient  across his prosthetic aortic valve was only 10 mmHg with a peak gradient  of 18.  He had no bleeding diathesis, TIAs, or evidence of abnormal clot  formation on the valve.   REVIEW OF SYSTEMS:  Otherwise negative.   MEDICATIONS:  1. Coumadin as directed.  2. Multivitamins.  3. Pepcid  recently started b.i.d.  4. Lipitor 40 a day.  5. Altace 10 b.i.d.  6. Norvasc 5 mg a day.  7. Metoprolol 50 b.i.d.  8. Hydrochlorothiazide 12.5 a day.  9. Klor-Con half of an 8 mcg tablet a day.   PHYSICAL EXAMINATION:  GENERAL:  Remarkable for an obese white male, in  no distress.  Affect is appropriate.  VITAL SIGNS:  Weight is 300, afebrile,  respiratory rate 14, blood  pressure today is 123/72, pulse 55 and regular, respiratory rate 14.  HEENT:  Unremarkable.  NECK:  Carotids are normal without bruit, no lymphadenopathy, no  thyromegaly, no JVP elevation.  LUNGS:  Clear.  Good diaphragmatic motion.  No wheezing.  S1 and S2,  click with a systolic murmur.  No diastolic murmur.  ABDOMEN:  Protuberant.  Bowel sounds positive.  No AAA, no tenderness, no bruit,  no hepatosplenomegaly, no hepatojugular reflux, no tenderness.  EXTREMITIES:  Distal pulse are intact.  No edema.  NEURO:  Nonfocal.  SKIN:  Warm and dry.  MUSCULOSKELETAL:  No muscular weakness.   IMPRESSION:  1. Aortic valve replacement functioning normally.  Followup in the      [facility], echo in a year, SBE prophylaxis.  2. Hypertension, borderline controlled.  Continue current medications,      decrease salt intake, and attempted weight loss.  3. Central obesity.  A dietary consult.  Try to increase activity      levels, particularly with water type aerobics and/or bicycling.      The patient will also try to increase his ambulation as far as his      knees will allow.  4. Hypercholesterolemia.  Lipid and liver profile in 6 months.      Continue statin therapy.  5. Previous lower extremity edema, dependent secondary to obesity.      Continue low-dose diuretic.   I will see him back in 3 months.     [name]. [name], MD, [facility]  Electronically Signed    PCN/MedQ  DD: 04/14/2008  DT: 04/14/2008  Job #: [id number]

## 2011-01-09 NOTE — H&P (Signed)
[name], [name] NO.:  [id number]   MEDICAL RECORD NO.:  [id number]          PATIENT TYPE:  IPS   LOCATION:  NA                           FACILITY:  [facility]   PHYSICIAN:  Ellwood Dense, M.D.   DATE OF BIRTH:  05-12-1934   DATE OF ADMISSION:  DATE OF DISCHARGE:                              HISTORY & PHYSICAL   PRIMARY CARE PHYSICIAN:  Caryn Bee L. Little, M.D.   CARDIOLOGIST:  [name]. [name], MD.   INTERVENTIONAL RADIOLOGISTSimonne Maffucci K. Corliss Skains, M.D.   Margarette AsalLynelle Smoke I. Patsi Sears, M.D.   HISTORY OF PRESENT ILLNESS:  [name] is a 77 year old Caucasian male  with history of aortic valve replacement with atrial fibrillation and  prior coronary artery bypass grafting with chronic Coumadin.   The patient was admitted August 23, 2008 after a fall outside a local  store.  He had acute onset of low back pain.  On admission, cranial CT  was negative for acute changes.  MRI study of the thoracic and lumbar  spine showed T12 superior endplate compression deformity with also  incidental finding of right renal mass.  CT of the pelvis was negative  for fracture or lesions.   The patient underwent T12 kyphoplasty by Dr. Corliss Skains August 30, 2008.  Coumadin is ongoing for atrial fibrillation with cross coverage with  Lovenox.  He was seen by Dr. Patsi Sears from Urology for the renal mass  and they advised to monitor with repeat scan in 4-6 months.  He still  had decreased mobility and lower extremity weakness.  The patient was  evaluated by the rehabilitation physicians and felt to be an appropriate  candidate for inpatient rehabilitation.   REVIEW OF SYSTEMS:  Positive for lumbago and palpitations.   PAST MEDICAL HISTORY:  1. History of coronary artery disease with aortic valve replacement in      2003.  2. Atrial fibrillation on chronic Coumadin.  3. Dyslipidemia.  4. Hypertension.  5. Diverticulosis.  6. Obesity.  7. Hypokalemia.   FAMILY  HISTORY:  Positive for coronary artery disease.   SOCIAL HISTORY:  The patient lives with his wife who can assist as  needed.  They live in a one-level home with 3 steps to enter.  The  patient does not use alcohol or tobacco.   FUNCTIONAL HISTORY PRIOR TO ADMISSION:  Independent and driving.   FUNCTIONAL STATUS AT PRESENT:  Min assist with bed mobility and mod  assist 85 feet with a rolling walker.   ALLERGIES:  SULFA.   MEDICATIONS PRIOR TO ADMISSION:  1. Amlodipine 10 mg daily.  2. Coumadin daily.  3. Hydrochlorothiazide 25 mg daily.  4. Lipitor 40 mg daily.  5. Lopressor 75 mg b.i.d.  6. Nitrostat p.r.n.  7. Potassium chloride 8 mEq daily.  8. Aciphex 20 mg daily.  9. Pepcid 30 mg daily.  10.Reglan 10 mg a.c. and h.s.  11.Vitamin B12 shots.  12.Multivitamin daily.   LABORATORY:  Recent hemoglobin 13.2 with hematocrit of 38.6, platelet  count of 402,000, white count 7.7.  Recent INR today was 1.5.  Recent  sodium is 133, potassium 3.0, chloride 96, bicarbonate 29, BUN 8,  creatinine 0.7.   PHYSICAL EXAMINATION:  GENERAL:  Well-appearing, overweight adult male  lying in bed in no acute discomfort.  VITAL SIGNS:  Blood pressure  125/75 with pulse of 61, respiratory rate 18 and temperature 97.3.  HEENT:  Normocephalic, nontraumatic.  CARDIOVASCULAR:  Irregular rate and rhythm.  S1-S2 without murmurs.  ABDOMEN:  Soft, obese, nontender with positive bowel sounds.  LUNGS:  Clear to auscultation bilaterally.  NEUROLOGIC:  Alert and oriented x3.  Cranial nerves II-XII are intact.  EXTREMITIES:  Bilateral upper extremity exam showed 4+/5 strength  throughout.  Bulk and tone were normal.  Reflexes are 2+ and  symmetrical.  Sensation was intact to light touch with bilateral upper  extremities.  Lower extremity exam showed 4-/5 strength in hip flexion,  knee extension, ankle dorsiflexion.  Sensation was intact to light touch  with bilateral lower extremities.  BACK:  Examination  of his back showed no significant sign or even a  puncture.  There is no drainage whatsoever.  The patient has chronic  moles on his back, but there is no sign of any trauma from the  kyphoplasty.   DIAGNOSES:  1. Status post fall with T12 compression fracture, status post      kyphoplasty.  2. History of aortic valve replacement along with coronary artery      bypass grafting  and atrial fibrillation on chronic Coumadin.   PLAN:  1. Presently, the patient will be admitted to receive      interdisciplinary care between the physiatrist, rehab nursing staff      and therapy team.  The patient's level of medical complexity and      substantial therapy needs in context with that medical necessity      cannot be provided at a lesser intensity of care.  The patient has      experienced substantial functional loss from his baseline.  Judging      by the patient's diagnosis, physical exam and functional history.      The patient is expected to make excellent improvement in functional      gains and be able to return home with his wife.  The physiatrist      will provide 24-hour management of medical needs as well as      oversight of the therapy, plan/treatment and provide guidance as      appropriate regarding the interaction as to.  2. A 24-hour rehab nursing will assist in management of bowel and      bladder, and help integrate therapy concepts, techniques and      education.  3. Physical therapy will assess and treat for range of motion,      strengthening, bed mobility, transfers, pre-gait training, gait      training and equipment evaluation with goals of modified      independent.  4. Occupational therapy will assess and treat for range of motion,      strengthening, ADLs, cognitive/perceptional training, splinting and      equipment evaluation with goals of modified independent.  5. Case management and social worker will assess and treat for      psychosocial issues and discharge  planning as appropriate.  Team      conferences will be held weekly to establish goals, assess progress      and determine barriers to discharge.  6. The patient has demonstrated sufficient medical stability and  exercise capacity to tolerate at least 3 hours of therapy per day      at least 5 days per week.  7. Estimated length of stay is 5-10 days.   PROGNOSIS:  Excellent.   MEDICAL PROBLEMS:  1. Hypertension, presently monitored on Norvasc, hydrochlorothiazide,      Lopressor, Altace and Betapace.  2. History of hypokalemia with daily supplementation and recheck of      labs, September 06, 2008.  3. Dyslipidemia with continuation of Crestor in place of home Lipitor.  4. Right renal mass identified on CAT scan with follow up by Dr.      Patsi Sears as an outpatient.  5. Pain management as necessary.           ______________________________  Ellwood Dense, M.D.     DC/MEDQ  D:  09/03/2008  T:  09/03/2008  Job:  [id number]

## 2011-01-09 NOTE — H&P (Signed)
NAMEBURLEIGH, BROCKMANN               ACCOUNT NO.:  [id number]   MEDICAL RECORD NO.:  [id number]          PATIENT TYPE:  INP   LOCATION:  1429                         FACILITY:  [facility]   PHYSICIAN:  Loma Sender, MD         DATE OF BIRTH:  [birth year]   DATE OF ADMISSION:  01/03/2009  DATE OF DISCHARGE:                              HISTORY & PHYSICAL   PRIMARY CARE PHYSICIAN:  Caryn Bee L. Little, M.D. with [facility].   ADMITTING SERVICE:  [employer], early years/pre.   CHIEF COMPLAINT:  Cough and weakness.   HISTORY OF PRESENT ILLNESS:  Mr. Kyler is a 77 year old gentleman with  past medical history significant for aortic valve replacement, history  of atrial fibrillation on chronic anticoagulation, and recent T12  compression fracture status post kyphoplasty who presents to the  emergency department with chief complaint as noted above.  The patient  reports that over the last week he developed coughing and cold like  symptoms that had progressively gotten worse.  He was seen by his  primary care Kierstyn Baranowski on two separate occasions and was prescribed over-  the-counter flu medications with minimal improvement in his symptoms.  Over the last several days, he has developed anorexia, somnolence and  weakness that has progressively gotten worse.  The patient's wife  reports that this morning she noted that the patient felt bad and would  not get up to go to church.  When the patient continued to not act  himself, she did get him out of bed and noted that he had very difficult  time following her commands.  When they continued to have problems  trying to get the patient up, they called EMS for further assistance.  The patient reports subjective fevers, but denies any chest pain, vision  changes or headaches.  Wife reports that he has had no problems with  dysarthria or not being oriented to person, place and time, but just  could not follow simple commands before coming to the hospital.  The  patient denies shortness of breath but does confirm difficulty walking  secondary to feelings of exertion.  He denies having diarrhea, nausea,  vomiting or abdominal pain, but does report increased urination with  polyuria, but denies dysuria or hesitancy nor does he feel like he is  not emptying his bladder.  In the emergency department, the emergency  room doctor felt like the patient had mental slowness with difficulty  answering questions.  He did not attempt to walk him in the emergency  department.  Workup at presentation showed a low grade fever at 100.9  and a small leukocytosis at 10.9.  Chest x-ray and urinalysis was  negative.   PAST MEDICAL HISTORY:  1. Hypertension.  2. Aortic valve replacement.  3. Coronary artery disease status post CABG.  4. History of atrial fibrillation.  5. Hyperlipidemia.  6. History right renal mass.  7. Chronic anticoagulation secondary to history of atrial      fibrillation.  8. Thoracic T12 compression fracture status post kyphoplasty.  9. History of hypokalemia leading to altered mental status.  MEDICATIONS:  1. Amlodipine 10 mg one tablet daily.  2. Coumadin 5 mg one tablet by mouth daily.  3. Hydrochlorothiazide 25 mg one tablet by mouth daily.  4. Lipitor 40 mg one tablet daily.  5. Lopressor 75 mg twice daily.  6. Ramipril 10 mg twice daily.  7. Reglan 2 mg a.c. h.s.  8. Pepcid 10 mg one tablet daily.  9. Aciphex 20 mg daily.  10.K-Lor 8 mEq one tablet daily.  11.Nitrostat 0.4 mg as needed.  12.Vitamin B12 1000 mg daily.  13.Multivitamin one tablet by mouth daily.  14.Sotalol 80 mg twice daily.  15.Hydrocodone/acetaminophen p.r.n.   ALLERGIES:  SULFA.   FAMILY HISTORY:  Hypertension and diabetes.   SOCIAL HISTORY:  The patient is retired from [employer].  He  has been married 53 years and has two children.  He stopped smoking  approximately 35 years ago.  Denies any illicit drugs or alcohol use.   REVIEW OF  SYSTEMS:  Complete review of systems was performed, otherwise  negative unless noted in the history of present illness except for the  fact that the patient has had left knee pain recently that has been  relapsing, was worse with movement.   PHYSICAL EXAMINATION:  VITAL SIGNS:  Blood pressure 104/47, pulse 88,  respiratory rate 18, pulse 88, satting 97% on room air, temperature  100.9.  GENERAL:  This is an obese male looking his stated age in no  acute distress.  HEENT:  Patient is normocephalic, atraumatic.  Extraocular movements are  intact.  Pupils are equal, round, reactive to light.  Oropharynx is  clear without erythema or exudate.  Nares are patent.  Hearing is  grossly intact.  Mucous membranes are moist.  NECK:  Supple without lymphadenopathy, jugular venous distention or  carotid bruit.  LUNGS:  Clear to auscultation bilaterally without wheezes or rhonchi.  HEART:  S1, mechanical S2.  Regular rate and rhythm without rubs,  gallops or murmurs.  ABDOMEN:  Obese, nontender, nondistended.  Bowel sounds are active.  NEUROLOGIC: Cranial nerves II through XII are intact.  Muscle strength  is symmetric.  Sensation is grossly normal.  Cerebellar testing is  within normal limits.  MUSCULOSKELETAL:  No bony abnormalities.  Muscle tenderness or active  synovitis.  SKIN:  No rashes or lesions are noted.  MENTAL STATUS:  Alert and oriented x4.  Memory is intact to recent and  distant events.  Mood is appropriate.  Affect is full.   LABORATORY DATA:  Lactic acid 1.3.  Digoxin less than 0.2.  CMP shows  sodium 133, potassium 3.4, chloride 101, bicarb 24, glucose 114, BUN 14,  creatinine 0.82, T bili 0.9, alk phos 82, AST 20, ALT 22, total protein  5.9, albumin 3.6, calcium 8.9, INR 1.9.  CBC white count 10.9,  hemoglobin 13.6, MCV 87.6, platelet count 180.  Urinalysis shows  specific gravity 1.027, otherwise negative.  Chest x-ray shows no acute  disease.   ASSESSMENT/PLAN:  This is a  77 year old gentleman with past medical  history significant for coronary artery disease status post bypass  surgery, history of aortic valve replacement, history of atrial  fibrillation on chronic anticoagulation who presents with symptoms  concerning for pneumonia with altered mental status.  1. Altered mental status.  The patient appears neurologically intact      on exam at this point with no signs of stroke.  I do question TIAs      of possible consideration, but with the  patient being      anticoagulated, I think this is much less likely.  Symptoms seem to      improve after IV hydration, so I question whether some dehydration      might be playing a role in this.  At this time, will continue to      monitor, have physical therapy evaluate the patient since he was      having difficulty walking and consider further diagnostic workup in      the morning.  2. Cough, low grade fever and leukocytosis.  At this point,      considering the patient's clinical appearance.  I am going ahead      and call this community acquired pneumonia at this point and      initiate treatment with Rocephin and azithromycin.  Will follow      white blood cell count and symptomatology as well as the patient's      urine and blood cultures.  3. History of atrial fibrillation on chronic anticoagulation.  The      patient is currently therapeutic on his Coumadin based on his INR.      Will go ahead and continue his Coumadin at 5 mg daily.  4. Hypertension.  Continue the patient on his current medical      management.  5. History of atrial fibrillation.  Will continue the patient on      sotalol and Lopressor.  6. Prophylaxis.  The patient is already anticoagulated with Coumadin.  7. Fluid, Electrolytes and Nutrition.  Normal saline at 75, monitor      electrolytes daily.  Continue the patient on his supplemental KCL      regular diet.   ETHICS:  The patient is a full code.      Loma Sender, MD   Electronically Signed     TJ/MEDQ  D:  01/03/2009  T:  01/03/2009  Job:  [id number]

## 2011-01-09 NOTE — Assessment & Plan Note (Signed)
[facility]                            CARDIOLOGY OFFICE NOTE   NAME:Ales, KARSEN NAKANISHI                      MRN:          [id number]  DATE:01/13/2007                            DOB:          24-Oct-1933    Treyshawn returns today for followup. The last time I saw him there was a  question of endocarditis. The patient has a prosthetic aortic valve. He  had been having some generalized weakness, vertigo and malaise. His sed  rate was 2. His blood cultures were negative. His echo showed the valve  to be functioning fairly well. We stopped his Lipitor to see if he felt  better. He does not think it was related. He is actually feeling better  at this time.   He is not having chest pain, PND or orthopnea. There has been no  palpitations.   The patient feels that he can go back on his statin drug.   His review of systems is otherwise is negative.   CURRENT MEDICATIONS:  1. Coumadin as directed.  2. Hydrochlorothiazide 25 a day.  3. Fish oil.  4. Lipitor 40 a day.  5. Altace 10 a day.  6. Norvasc 5 a day.  7. Metoprolol 50 b.i.d.   In regards to his heart, he is on chronic Coumadin for his aortic valve  replacement. He has not had any bleeding diathesis.  His Coumadin levels  have been fine. His valve has been functioning normally by echo with  just a small peri=prosthetic leak.   In regards to his lower extremity edema, it has been chronic and mild.  He has tried to watch the salt in diet, but does cheat from time-to-  time. He has been taking his low dose hydrochlorothiazide. In regards to  his blood pressure, it has been well-controlled on the combination of  beta-blockers, Norvasc and Altace.   His review of systems otherwise is negative.   Medications were as indicated in chart anotoed and reviewed   PHYSICAL EXAMINATION:  Is remarkable for an overweight middle-aged male  in no distress. Affect is excellent. His weight is 290. Blood pressure  130/80. Pulse 62 and regular.  HEENT: Is normal. There is no lymphadenopathy. No JVP elevation. No  carotid bruits.  LUNGS:  Are clear with normal diaphragmatic motion.  Heart sounds show metallic S2 with a systolic murmur. There is no  diastolic murmur. PMI is not palpable.  ABDOMEN: Is benign. Bowel sounds are positive. There are no masses,  hepatosplenomegaly. There is no hepatojugular reflux or  hepatosplenomegaly.  The femorals are +2. Distal pulses are intact. There is +1 edema  bilaterally.  NEURO: Is nonfocal. Muscles have no weakness.   IMPRESSION:  1. Stable aortic valve replacement. No evidence of endocarditis.      Continue Coumadin therapy.  2. Mild lower extremity edema. Continue low-dose diuretics.  3. Malaise and vertigo, resolved. The patient is to go back on statin      drugs for hypercholesterolemia.   I will see him back in about three months.     [name]. [name], MD, [facility]  Electronically Signed    PCN/MedQ  DD: 01/13/2007  DT: 01/13/2007  Job #: [id number]

## 2011-01-09 NOTE — Assessment & Plan Note (Signed)
[facility]                            CARDIOLOGY OFFICE NOTE   NAME:[name], [name]                      MRN:          [id number]  DATE:10/07/2007                            DOB:          Nov 03, 1933    [name] returns today for followup.  He is status post aortic valve  replacement about 6 years ago.  He has not had a followup ultrasound in  about 3 years.  He does have some exertional dyspnea.  I suspect it is  functionally.  He continues to be overweight.  We talked about this.  He  seems to like his wife's cooking too much and is fairly sedentary.  I  talked to him at length about an exercise program and decreased caloric  intake.  His Coumadin has been therapeutic.  He has not had any bleeding  diathesis.   He had some problems at the end of last year regarding dehydration, with  a GI illness and had significant hypokalemia.  He is now on potassium  replacement.   REVIEW OF SYSTEMS:  Otherwise negative.   MEDICATIONS:  Include:  1. Coumadin as directed.  2. Hydrochlorothiazide 25 a day.  3. Multivitamins.  4. Pepcid.  5. Tylenol.  6. Lipitor 40 a day.  7. Altace 10 b.i.d.  8. Norvasc five a day.  9. Metoprolol 50 b.i.d.  10.Klor-Con.  11.Metoclopramide.   EXAM:  GENERAL:  His remarkable for an overweight, middle-aged white  male in no distress.  VITAL SIGNS:  Weight is 298, blood pressure 140/80, pulse 60 and  regular, afebrile, affect appropriate, respiratory rate 16.  HEENT:  Unremarkable.  Carotids normal without bruit.  No lymphadenopathy,  thyromegaly or JVP elevation.  LUNGS:  Clear with diaphragmatic motion.  No wheezing, S1-S2, second  heart sound is a click.  There is a soft systolic murmur through the  prosthetic valve.  There is no AI.  PMI normal, sternotomy well healed.  ABDOMEN:  Benign.  Bowel sounds positive, no AAA, no hepatosplenomegaly  or hepatojugular reflux.  No bruit, no tenderness, no AAA.  EXTREMITIES:   Distal pulses are intact, no edema.  NEURO:  Nonfocal.  SKIN:  Warm and dry.  No muscular weakness.  Baseline EKG is normal.   IMPRESSION:  1. Status post aortic valve replacement, follow-up echo, given some of      his dyspnea, check LV function, valve sounds normal.  Continued      followup in Coumadin Clinic.  2. History of paroxysmal atrial fibrillation, seems to be maintaining      sinus rhythm, already on Coumadin and beta blocker.  He will call      [place], if he has any recurrent palpitations.  3. Exertional dyspnea likely related to weight, counseled on diet and      exercise level, not a candidate for bariatric surgery and not      interested in Opti-Fast diet.  Continue attempts at weight loss.  4. Hypercholesterolemia in the setting of previous bypass and aortic      valve replacement.  Continue Lipitor 40 a day, lipid  and liver      profile in 6 months.   So long as his echo is normal.  I will see him back in 6 months.     [name]. [name], MD, [facility]  Electronically Signed    PCN/MedQ  DD: 10/07/2007  DT: 10/08/2007  Job #: [id number]

## 2011-01-09 NOTE — H&P (Signed)
[name], [name]               ACCOUNT NO.:  [id number]   MEDICAL RECORD NO.:  [id number]          PATIENT TYPE:  EMS   LOCATION:  MAJO                         FACILITY:  [facility]   PHYSICIAN:  Michiel Cowboy, MDDATE OF BIRTH:  Oct 04, 1933   DATE OF ADMISSION:  08/23/2008  DATE OF DISCHARGE:                              HISTORY & PHYSICAL   PRIMARY CARE Hoorain Kozakiewicz:  Dr. Catha Gosselin.   CARDIOLOGIST:  Dr. [name].   Patient is a 77 year old gentleman with a history of aortic valve  replacement, on chronic Coumadin, hypertension, hyperlipidemia.  Patient  was at baseline when he suffered a fall Christmas Eve resulting in an  injury to his back and he hit his head mildly as well.  Since then, he  has been having severe low back pain.  He reports that he had been  having low back pain to begin with at baseline but after the fall it got  much worse.  Patient was bed bound since the fall and has not been  having any bowel movements, to urine in urinal, and occasionally could  make it to the bathroom.  He denies any chest pain or shortness of  breath but today when he was trying to get up to go see a doctor he  started to get nauseous and started getting scared that he was having a  heart attack although he was not having any chest pain.  When he  presented to the emergency department, he was found to be in atrial  fibrillation which per him he had an episode of once when he was  hospitalized before.  Otherwise, review of systems negative.  No fevers.  No chills.  No weakness.   PAST MEDICAL HISTORY:  Significant for:  1. Hypercholesterolemia.  2. Hypertension.  3. History of aortic valve replacement.  4. Status post CABG.  5. History of rapid atrial fibrillation.  6. Diverticulitis.  7. Coronary artery disease.  8. Obesity.  9. Hypokalemia.   SOCIAL HISTORY:  Denies tobacco, alcohol, or drug use.   FAMILY HISTORY:  Noncontributory.   ALLERGIES:  SULFA.   HOME  MEDICATIONS:  1. Amlodipine 10 mg daily.  2. Coumadin 10 mg Monday, Wednesday, and Friday, 7.5 on all other      days.  3. Hydrochlorothiazide 25 mg daily.  4. Lipitor 40 mg daily.  5. Lopressor 75 mg twice a day.  6. __________twice a day.  7. Reglan 10 mg 4 times a day.  8. Pepcid 30 mg once a day.  9. Aciphex 20 mg once a day.  10.Potassium 8 mEq daily.  11.Nitrostat as needed.  12.Vitamin B12 shots.  13.Multivitamins.   VITAL SIGNS:  Temperature 97.8.  Blood pressure 127/62, now down to  109/77.  Pulse 78.  Respirations 18.  Satting 96% on room air.  Patient  appears to be currently in no acute distress.  Reports good improvement  with medications for pain but when tries to sit up still has a back pain  which is fairly severe.  HEART:  Regular with occasional irregular beats.  On telemetry, appears  to be in sinus rhythm with occasional PACs.  Hear sounds, I do hear a  crisp S1.  LUNGS:  Clear to auscultation anteriorly.  Patient unable to sit up.  ABDOMEN:  Soft with mild left lower quadrant tenderness, otherwise  unremarkable.  LOWER EXTREMITIES:  No clubbing, cyanosis, or edema.  Strength 5/5 in  all 4 extremities.  CRANIAL NERVES:  Intact.   No labs were obtained.  Plain films were back showing spondylosis but no  acute fracture.  Chest x-ray showed no acute disease.  EKG, the one  obtained today, worrisome for atrial fibrillation, heart rate 111.  I  think this was stress and is currently resolved.   ASSESSMENT AND PLAN:  This is a 77 year old gentleman with history of  paroxysmal atrial fibrillation in the past, presents status post back  injury now with a lot of pain and recurrent episode of paroxysmal atrial  fibrillation that is now resolved.  1. Back pain, most likely musculoskeletal.  Plain films are      unremarkable.  Given severity of pain, as well as being on      Coumadin, as well as chronic back pain, we will obtain MRI of      lumbar spine to evaluate  for both disk disorders and also possible      bleed although less likely.  We will give Percocet and morphine as      needed for pain.  If no evidence of severe pathology, we will have      physical therapy/occupational therapy see patient.  He may need      short rehab stay if still not able to ambulate despite pain      medication.  2. Paroxysmal atrial fibrillation, currently resolved but if recurs we      will call cardiology.  We will continue metoprolol for now and      patient is already on Coumadin.  We will obtain INR.  3. History of aortic valve replacement.  We will check INR.  Patient      denies history of syncope with the mechanical fall.  We will      continue Coumadin.  4. Hypertension.  Last blood pressure somewhat down.  We will hold      blood pressure meds for now except for metoprolol and we will write      metoprolol holding parameters.  5. Hyperlipidemia.  Continue Lipitor and recheck fasting lipid panel.  6. Gastroesophageal reflux disease.  Continue Protonix.   DISPOSITION:  Admit to tele.  Given fall and severe pain, will likely  need physical therapy and occupational therapy consult as patient unable  to ambulate right now.      Michiel Cowboy, MD  Electronically Signed     AVD/MEDQ  D:  08/23/2008  T:  08/23/2008  Job:  [id number]   cc:   [name]. [name], MD, [facility]  Anna Genre. Little, M.D.

## 2011-01-09 NOTE — Discharge Summary (Signed)
[name], [name]               ACCOUNT NO.:  [id number]   MEDICAL RECORD NO.:  [id number]          PATIENT TYPE:  IPS   LOCATION:  4030                         FACILITY:  [facility]   PHYSICIAN:  [name], M.D.   DATE OF BIRTH:  [birth year]   DATE OF ADMISSION:  09/03/2008  DATE OF DISCHARGE:  09/08/2008                               DISCHARGE SUMMARY   DISCHARGE DIAGNOSES:  1. Thoracic T12 compression fracture status post kyphoplasty August 30, 2008.  2. Aortic valve replacement with coronary artery bypass grafting and      atrial fibrillation.  3. Hypertension.  4. Hyperlipidemia.  5. Right renal mass.  6. Chronic Coumadin for history of atrial fibrillation.   This is a 77 year old male history of aortic valve replacement, atrial  fibrillation with chronic Coumadin therapy who was admitted on August 23, 2008, after a fall with acute low back pain.  Cranial CT scan showed  a T12 superior end plate compression deformity and also incidental  finding of right renal mass.  CT of the pelvis negative for fracture or  lesions.  He underwent T12 kyphoplasty per Dr. Corliss [name] on August 30, 2008, Coumadin ongoing for atrial fibrillation.  Urology followup Dr.  Patsi [name] for renal mass, advised to monitor for now, repeat scan in 4-  6 months.  The patient still with increased mobility and weakness,  limited balance.  He was admitted for comprehensive rehab program.   PAST MEDICAL HISTORY:  See discharge diagnoses.  No alcohol or tobacco.   ALLERGIES:  Sulfa.   SOCIAL HISTORY:  Lives with wife and assistance as needed.  One level  home, 3 steps to entry.   FUNCTIONAL HISTORY:  Prior to admission was independent, driving.   FUNCTIONAL STATUS:  Upon admission to rehab services was minimal assist  for bed mobility, moderate assist for ambulation.   MEDICATIONS PRIOR TO ADMISSION:  1. Norvasc 10 mg daily.  2. Coumadin as directed.  3. Hydrochlorothiazide 25 mg daily.  4.  Lipitor 40 mg daily.  5. Lopressor 75 mg twice daily.  6. Reglan 10 mg before each meal at bedtime.  7. Aciphex 20 mg daily.  8. Potassium chloride 8 mEq daily.  9. Nitroglycerin as needed.  10.Multivitamin.   PHYSICAL EXAMINATION:  VITAL SIGNS:  Blood pressure 125/75, pulse 61,  temperature 97.3, respirations 18.  GENERAL:  This was an alert male in no acute distress, oriented x3.  Deep tendon reflexes were 2+.  LUNGS:  Clear to auscultation.  CARDIAC:  Regular, rate controlled.  ABDOMEN:  Soft, nontender.  Good bowel sounds.  NEUROLOGIC:  Sensation intact to light touch.   REHABILITATION HOSPITAL COURSE:  The patient was admitted to inpatient  rehab services with therapies initiated on a 3-hour daily basis  consisting of physical therapy, occupational therapy, and rehabilitation  nursing.  The following issues were addressed during the patient's  rehabilitation stay.  Pertaining to [name] thoracic T12  compression fracture, he had undergone kyphoplasty August 30, 2008.  He  would follow up with Dr. Corliss [name] of Radiology Services.  He was now  modified independent in his room ambulating extended distances.  He was  followed by Cardiology Services for history of aortic valve replacement  and atrial fibrillation.  His blood pressures are monitored closely on  multiple antihypertensive medications of Norvasc, hydrochlorothiazide,  Lopressor, Altace, and Betapace.  There were no orthostatic changes  noted.  He remained on Crestor for history of hyperlipidemia.  There was  incidental findings of right renal mass, which was addressed per Dr.  Patsi [name] of Urology Services.  Plan for followup scan in 4-6 months as  an outpatient.  The patient remained on chronic Coumadin therapy for  aortic valve replacement, atrial fibrillation.  He was followed by Dr.  Shelby [name] at the [facility].  On day of discharge,  his INR was 3.1 with no bleeding tendencies.  Weekly  collaborative  interdisciplinary team conferences were held to discuss the patient's  estimated length of stays, family teaching, and any barriers to  discharge.  He was discharged to home on September 08, 2008.  He was  supervisioned to modified independence in all areas of activities of  daily living.  He was able to communicate his needs.  Strength and  endurance have greatly improved.  He was discharged to home.   DISCHARGE MEDICATIONS:  1. Aciphex 20 mg daily.  2. Altace 10 mg twice daily.  3. Lipitor 40 mg daily.  4. Betapace 80 mg every 12 hours.  5. Potassium chloride 8 mEq daily.  6. Norvasc 10 mg daily.  7. Hydrochlorothiazide 25 mg daily.  8. Lopressor 25 mg one-half tablet twice daily.  9. Coumadin 5 mg 2 tablets Monday, Wednesday, Friday, 1-1/2 tablets      all other days.  10.Multivitamin daily.   SPECIAL INSTRUCTIONS:  Home health nurse to follow Coumadin therapy,  check INR on Friday, September 10, 2008, results to Dr. Shelby [name] at the  [facility] [phone].  The patient should follow up Dr.  Catha [name], medical management, Dr. Eden [name], Cardiology Service, call  for appointment and with Dr. Patsi [name] in relation to incidental  findings of right renal mass for followup scan in the next 4-6 months.      [name], P.A.    ______________________________  [name], M.D.    DA/MEDQ  D:  09/08/2008  T:  09/08/2008  Job:  [id number]   cc:   [name], M.D.  [name] Pick. [name] Emms, MD, [facility]  [name], M.D.  [name], M.D.

## 2011-01-09 NOTE — Assessment & Plan Note (Signed)
[facility]                            CARDIOLOGY OFFICE NOTE   NAME:Masser, GURMAN ASHLAND                      MRN:          [id number]  DATE:07/07/2007                            DOB:          October 23, 1933    Luisfelipe returns today for followup.  He is status post previous coronary  artery bypass grafting with aortic valve replacement.  Unfortunately, he  was hospitalized in August of this year and I did not know about it.  He  was cared for by Dr. Catha Gosselin and the [facility].  The  patient, apparently, got fairly dehydrated.  There was a question of  previous diverticulitis.  He was somewhat unresponsive at home and  apparently in rapid atrial fibrillation.  We were never called to see  him in the hospital.  In talking to the patient, he said one of the  biggest problems was his potassium was real low.  The patient had been  on hydrochlorothiazide 25 mg a day.  We had checked his potassium on  December 19, 2006 and it 4.2.  I suspect that the real culprit was in his  diuretic, but he had some sort of GI illness, and lost potassium through  his GI tract.  He, apparently, was hydrated and recovered from his  hospitalization.  He has been doing fairly well since discharge.  He  feels much better.  He has not had any recurrent palpitations or  evidence of a-fib.  The patient's primary issue in regards to his heart  in the past has been previous coronary artery bypass grafting with an  aortic valve replacement.  He has had normal left ventricular function  in the past.   He has not had problems with atrial fibrillation before.  His last  Myoview, I believe, was in 2006, and was nonischemic with an ejection  fraction of 70%.   In talking to Rufino he feels much better.  There is no residual  weakness.  He has been taking Klor-Con to supplement his potassium.   He has not had any chest pain, PND or orthopnea, or lower extremity  edema.   His Coumadin  levels have been fine.   His current medications include:  1. Coumadin as directed by our clinic.  2. Metoclopramide 10 b.i.d.  3. Hydrochlorothiazide 25 a day.  4. Multivitamins.  5. Pepcid 20 b.i.d.  6. Fish oil.  7. Lipitor 40 a day.  8. Altace 10 b.i.d.  9. Norvasc 5 a day.  10.Metoprolol 50 b.i.d.  11.Klor-Con 8 mEq a day.   His exam is remarkable for an overweight white male in no distress.  Weight is 289 which is stable.  Blood pressure is 135/76, pulse 59 and  regular.  Afebrile.  Respiratory rate 14.  HEENT:  Unremarkable.  Carotids normal without bruit.  No lymphadenopathy, thyromegaly or JVP  elevation.  LUNGS:  Clear with good diaphragmatic motion, no wheezing.  There is an S1, S2 click with a systolic murmur.  There is no aortic  insufficiency.  PMI is normal.  ABDOMEN:  Benign.  Bowel sounds  positive.  No hepatosplenomegaly,  hepatojugular reflux, no tenderness.  Distal pulses are intact with no edema.  NEURO:  Nonfocal.  SKIN:  Warm and dry.  There is no lower extremity edema at this time.   EKG shows sinus rhythm at a rate of 58 and is totally normal.   IMPRESSION:  1. Recent hospitalization for weakness and tachycardia:  I will have      to get his monitor strips from the hospital to see if, indeed, he      had a-fib.  He is already on Coumadin a beta blocker and I do not      think he needs an antiarrhythmic at this time.  2. Aortic valve replacement:  Valve sounds normal.  Continue      anticoagulation.  No evidence of TIA.  Followup echo in 6 months.  3. History of coronary disease with previous coronary artery bypass      grafting:  Followup Myoview in 6 months to a year, not having chest      pain, continue aspirin and beta blocker.  4. Recent hypokalemia:  Continue hydrochlorothiazide and Klor-Con.      Dr. Catha Gosselin to followup BMET.  Given his current dose of      diuretic, I suspect his potassium will be in the 4 range.  5.  Hypercholesterolemia in the setting of bypass graft:  Continue      current dose of Lipitor and fish oil.  Followup lipid and liver      profile in 6 months.  Overall, Dillian seems to be doing well.     [name]. [name], MD, [facility]  Electronically Signed    PCN/MedQ  DD: 07/07/2007  DT: 07/07/2007  Job #: [id number]

## 2011-01-09 NOTE — Assessment & Plan Note (Signed)
[facility]                            CARDIOLOGY OFFICE NOTE   NAME:Deerman, THURMOND HILDEBRAN                      MRN:          [id number]  DATE:07/01/2008                            DOB:          01/20/1934    Ms. Misner returns today for followup.  He has a history of aortic valve  replacement with lower extremity edema and hypertension.   He has hypercholesterolemia as well.  He has been doing well.  He has  lost a little bit of weight.  We had a long talk before about a [place] type diet since he was over 300 pounds.  He has lost about a 8  pounds.  He needs to get a physical with Dr. Clarene Duke and get his BMET,  LFTs and cholesterol checked.  He promised me he would do this.  Coumadin levels have been running a little high and his dose was  decreased.   REVIEW OF SYSTEMS:  Otherwise negative.   MEDICATIONS:  1. Coumadin as directed.  2. Hydrochlorothiazide 25 a day.  3. Potassium 8 mEq a day.  4. Fish oil.  5. Lipitor 40 a day.  6. Altace 10 a day.  7. Norvasc to be discontinued.  8. Metoprolol 50 t.i.d.   PHYSICAL EXAMINATION:  GENERAL:  Exam is remarkable for an overweight  white male in no distress.  VITAL SIGNS:  His weight is down from 300 to 293, blood pressure 127/77,  pulse 57 and regular, respiratory rate 14 and afebrile.  HEENT:  Unremarkable.  NECK:  Carotids are normal without bruit.  No lymphadenopathy,  thyromegaly, or JVP elevation.  LUNGS:  Clear with good diaphragmatic motion.  No wheezing.  HEART:  S1 and S2 click with a soft systolic murmur.  No AI.  PMI  normal.  ABDOMEN:  Benign.  Bowel sounds positive.  No AAA, no tenderness, no  bruit, no hepatosplenomegaly, hepatojugular reflux, or tenderness.  EXTREMITIES:  Distal pulses are intact with +1 edema.  NEURO:  Nonfocal.  SKIN:  Warm and dry.  MUSCULOSKELETAL:  No muscular weakness.   IMPRESSION:  1. Aortic valve replacement functioning well.  No need for followup     echo.  Continue subacute bacterial endocarditis prophylaxis.  2. Anticoagulation with Coumadin.  Follow up in the clinic.  No      bleeding diathesis.  3. Lower extremity edema.  Continue low-dose diuretic and potassium      replacement.  Elevate legs at the end of day.  Avoid salt.  4. Hypertension, currently well controlled.  He lost his prescription      or bottle of Norvasc.  I told him his blood pressure was fine.  We      thought, it may be contributing to his lower extremity edema so we      will stop it.  He will call me if his blood pressure gets elevated      off that I will see him back in 6 months.     [name]. [name], MD, [facility]  Electronically Signed    PCN/MedQ  DD:  07/01/2008  DT: 07/01/2008  Job #: [id number]

## 2011-01-09 NOTE — H&P (Signed)
NAMENAVEEN, CLARDY               ACCOUNT NO.:  [id number]   MEDICAL RECORD NO.:  [id number]          PATIENT TYPE:  INP   LOCATION:  3729                         FACILITY:  [facility]   PHYSICIAN:  Hollice Espy, M.D.DATE OF BIRTH:  June 18, 1934   DATE OF ADMISSION:  04/10/2007  DATE OF DISCHARGE:                              HISTORY & PHYSICAL   PRIMARY CARE PHYSICIAN:  Caryn Bee L. Little, M.D.   CARDIOLOGIST:  [name]. [name], MD, [facility], [facility]   CHIEF COMPLAINT:  Weakness.   HISTORY OF PRESENT ILLNESS:  The patient is a 77 year old white male  with a past medical history of obesity, CAD, status post CABG, with a  history of prostatic valve on anticoagulation, and a brief history of  post-CABG atrial fibrillation who was diagnosed with recent  diverticulitis in the last 24 hours and started on p.o. antibiotics.  He  had been complaining of some abdominal pain and was started on p.o.  medications. He continued to have some episodes of lower quadrant  abdominal pain and loose stool over the past 12 to 24 hours and then  today had the sudden onset, suddenly just felt very weak to the point  where he could not even stand up.  He denied any chest pain, no  shortness of breath.  The paramedics were called and he was found to be  in rapid atrial fibrillation with a heart rate of 175 to 200.  The  patient was given en route from his home to the emergency room he was  given IV Cardizem and Phenergan which greatly improved his symptoms to  the point where he actually corrected into a sinus rhythm, rate  controlled by the time he came into the emergency room.  In fact his  heart rate was noted to be in the 80s.  Labs were ordered on the  patient.  He was found to have an elevated white count of 13 with an 88%  shift.  The other labs of note with a sodium of 128, a glucose of 193  with no previous history of diabetes and elevated CPK levels, normal MB  and troponins.  Currently the  patient feels quite worn out.  He denies  any headaches, vision changes, dysphagia.  No chest pain or  palpitations, no shortness of breath.  No wheezing, coughing.  He does  complain of some abdominal discomfort, however, he says it is much more  mild than it has been as of late.  He denies any hematuria or dysuria,  constipation or diarrhea, no focal extremity numbness, weakness or pain  although overall he feels quite fatigued.   REVIEW OF SYSTEMS:  Otherwise negative.   PAST MEDICAL HISTORY:  CABG, history of prosthetic valve on chronic  anticoagulation, history of paroxysmal atrial fibrillation post-  procedure.  He also has a history of hypertension as well.   MEDICATIONS:  The patient is on Coumadin 5 mg p.o. daily, Reglan 10 p.o.  b.i.d., HCTZ 25 p.o. daily, Pepcid 20 p.o. b.i.d., Lipitor 40 daily,  Altace 10 b.i.d., Norvasc 5 daily, and Lopressor 50  b.i.d.   ALLERGIES:  SULFA.   SOCIAL HISTORY:  He denies any tobacco, alcohol or drug use.   FAMILY HISTORY:  Noncontributory.   PHYSICAL EXAMINATION:  VITAL SIGNS:  The patient's vitals on admission,  temperature 97.9, heart rate 81 and regular rate.  Respirations 26, O2  saturations 95% on 2 liters.  Blood pressure 109/61.  GENERAL:  The patient is alert and oriented times 3 in no apparent  distress.  HEENT:  Normocephalic, atraumatic.  His mucous membranes are dry.  He  has a very narrow airway.  HEART:  Regular rate and rhythm.  2/6 systolic ejection murmur and a  systolic click.  LUNGS:  He has decreased breath sounds secondary to body habitus.  ABDOMEN:  Soft, nontender, distended with hypoactive bowel sounds.  EXTREMITIES:  No clubbing or cyanosis, trace pitting edema.   LABORATORY DATA:  BNP is 64, white count 13 with 88% shift, H&H 12.6 and  36.6, MCV of 87, platelet count of 240.  Sodium 128, potassium 3.1,  chloride 95, bicarbonate 22, BUN 2, creatinine 1, glucose is 193.  UA  just notes a 30 of protein and 40  of ketones.  CPK 244, MB less than  1.24, troponin 0.05, second set is similar.  Second set is similar.  INR  is minimally subtherapeutic at 1.9.   ASSESSMENT AND PLAN:  1. Rapid atrial fibrillation.  Now resolved.  Will keep on telemetry.      Continue Coumadin and change his blood pressure medication over to      IV.  2. Diverticulitis.  Will make the patient clear liquid diet.  Put him      on IV Cipro and Flagyl plus pain and nausea control.  3. Coronary artery disease with a history of coronary artery bypass      graft and prosthetic valve.  Continue Coumadin.  4. Obesity.  5. Hypokalemia, will replace.      Hollice Espy, M.D.  Electronically Signed     SKK/MEDQ  D:  04/11/2007  T:  04/11/2007  Job:  [id number]   cc:   Caryn Bee L. Little, M.D.  [name]. [name], MD, [facility]

## 2011-01-09 NOTE — Consult Note (Signed)
NAMEMAN, [name]               ACCOUNT NO.:  [id number]   MEDICAL RECORD NO.:  [id number]          PATIENT TYPE:  INP   LOCATION:  3715                         FACILITY:  [facility]   PHYSICIAN:  Pricilla Riffle, MD, FACCDATE OF BIRTH:  24-Dec-1933   DATE OF CONSULTATION:  08/23/2008  DATE OF DISCHARGE:                                 CONSULTATION   IDENTIFICATION:  The patient is a 77 year old gentleman who we are asked  to see regarding atrial fibrillation.   HISTORY OF PRESENT ILLNESS:  The patient has had a documented atrial  fibrillation in the past one after cardiac surgery and one in August  2008 in the setting of an acute illness (diverticulitis).   The patient presents to the emergency room today after falling on the  ice with severe back pain.  We are asked by the Medicine Service to  evaluate his atrial fibrillation.   While talking with the patient, he denies palpitations, breathing has  been up and down, but short of breath since surgery, no change.  He has  no PND, no chest tightness.  He does note some dizziness on and off more  lately he says.  No delta in energy.   ALLERGIES:  SULFA.   MEDICATIONS PRIOR TO ADMISSION:  1. Norvasc 10.  2. Coumadin as directed.  3. Hydrochlorothiazide 25.  4. Lipitor 40.  5. Metoprolol 75 b.i.d.  6. Altace 10 b.i.d.  7. Reglan 10 q.i.d.  8. Pepcid 10 b.i.d.  9. Aciphex 20 daily p.r.n.  10.Potassium 8 mEq daily.  11.Multivitamin.  12.B12 shots.  13.P.r.n. nitroglycerin.   PAST MEDICAL HISTORY:  1. Coronary artery disease, status post CABG with AVR ([employer]      region), (LIMA to LAD; SVG to D1; SVG to OM2; SVG to OM2; and PDA)      2003.  2. Postoperative atrial fibrillation.  3. Hypertension.  4. Dyslipidemia.  5. Obesity.  6. Diverticulitis.   SOCIAL HISTORY:  The patient is married, lives in [place], quit  tobacco.  Has approximately 10-pack-year, quit 50 years ago, does not  drink.  He is a retired Counsellor.   FAMILY HISTORY:  Significant for CAD in the mother and father and  siblings.   REVIEW OF SYSTEMS:  Chronic dyspnea since surgery, no change.  No  occasional edema, but okay on current treatment.  No palpitations as  stated above.  A.m. cough he attributes to sinus drainage, back pain as  noted above, GE reflux.  Otherwise all systems reviewed, negative to the  above problem except as noted above.   PHYSICAL EXAMINATION:  GENERAL:  The patient currently in no acute  distress other than from back pain.  VITAL SIGNS:  Blood pressure is 127/62, pulse is 78 and irregular,  temperature is 97.8.  On telemetry, the patient has sinus rhythm, atrial  fibrillation/question atrial flutter, rates anywhere from 90s to 158.  HEENT:  Normocephalic and atraumatic.  EOMI.  PERRL.  Mucous membranes  moist.  NECK:  Thick, unable to assess JVP.  No bruits.  LUNGS:  Relatively clear anteriorly, bases.  CARDIAC:  Irregularly irregular, S1, S2.  Crisp valve sounds.  No S3.  ABDOMEN:  Supple, nontender.  Normal bowel sounds.  No hepatomegaly.  EXTREMITIES:  Trivial edema.  2+ pulses distally.  NEUROLOGIC:  Alert and oriented x3.  Cranial nerves II through XII  grossly intact, moving all extremities.   Head CT shows mild atrophy.  No fracture or hemorrhage.  Lumbar x-ray;  multilevel spondylosis.  A 12-lead EKG shows atrial fibrillation, 111  beats per minute, ST depression V2 through V4, 0.5-1 mm.  Note, this was  present on previous EKGs, in sinus rhythm.   LABS:  Significant for hemoglobin of 14.4, WBC of 8.1.  BUN and  creatinine of 14 and 0.97, potassium of 3.5, INR of 2.3.  TSH pending.  Troponin 0.01.  CK-MB of 77 and 1.4.   IMPRESSION:  The patient is a 77 year old gentleman known coronary  artery disease, aortic valve disease status post coronary artery bypass  graft with aortic valve replacement, presents after a fall for back  pain, found to be an atrial fibrillation with rapid response at  times.  Note, telemetry shows atrial fibrillation, sinus rhythm, question atrial  flutter, rates as high as 158.  He has noted intermittent dizziness  prior to today, but otherwise asymptomatic, discussed with Dr. [name].   PLAN:  1. Check TSH, check echocardiogram, change metoprolol to sotalol 80      b.i.d., titrate as needed.  Will continue Norvasc for now, continue      Altace for now.  We will follow.  2. CAD, relatively asymptomatic.  We will follow.  3. Aortic valve replacement.  Again echo as noted above.  4. Dyslipidemia on Lipitor, continue.      Pricilla Riffle, MD, [facility]  Electronically Signed     PVR/MEDQ  D:  08/23/2008  T:  08/24/2008  Job:  [id number]

## 2011-01-09 NOTE — Consult Note (Signed)
NAMECOBURN, KNAUS               ACCOUNT NO.:  [id number]   MEDICAL RECORD NO.:  [id number]          PATIENT TYPE:  INP   LOCATION:  3715                         FACILITY:  [facility]   PHYSICIAN:  Sigmund I. 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  The patient has coincidental atrial fibrillation, and chronic  pain.  MRI of the lumbar spine was accomplished, and showed an  incidental right lower pole heterogeneous mass, consistent with renal  cell carcinoma.  Urology is consulted to consider biopsy and treatment  of the mass.   PAST MEDICAL HISTORY:  Significant for aortic valve replacement treated  with chronic Coumadin therapy.  The patient is preop kyphoplasty.  Tobacco, none.  Alcohol, none.   FAMILY HISTORY:  The patient lives at home with his wife.  Son is  visiting while the patient is interviewed.   ALLERGIES:  None.   MEDICATIONS:  1. Coumadin.  2. Protonix.  3. Crestor.  4. Altace.  5. Betapace.  6. Reglan.  7. Lopressor.  8. Colace.  9. Dulcolax.  10.OxyContin.  11.Tylenol.  12.Morphine.  13.Hydromorphone.  14.Oxycodone.   PHYSICAL EXAMINATION:  GENERAL:  An elderly male, in neck immobilizer,  unable to move side-to-side because of severe pain.  NECK:  Otherwise supple and nontender.  No nodes.  CHEST:  Clear to P and A.  COR:  Murmur present.  Well-healed chest scar.  ABDOMEN:  Soft plus bowel sounds without organomegaly or masses.  GENITOURINARY:  Normal penis, normal urethra, normal glans.  RECTAL:  Not indicated.  EXTREMITIES:  No cyanosis.  No edema.   Review of CT scan shows the patient has a 2.5 cm mass, with posterior  exophytic features, and projection.  The mass appears to have a  Hounsfield unit of 83, which washes out to 60.  He does not have a  noncontrast CT evaluation.   ASSESSMENT:  Probable small renal cell carcinoma.  I have advised Mr.  [name] to simply observe this lesion for now.  He needs to have his back  fixed, and have his heart and atrial fibrillation stabilized.  He should  return for reevaluation in approximately 4-6 months, at which time we  will review mass with ultrasound.  If the mass enhances, he may  eventually have an option of transcutaneous microwave thermotherapy  (radiology), versus laparoscopic cryotherapy.  Other option would be  open partial nephrectomy.  The patient understands these options, and  was given a appointment card and will make appointment for followup. (  high complexity, 40 minutes).      Sigmund I. Patsi Sears, M.D.  Electronically Signed     SIT/MEDQ  D:  08/25/2008  T:  08/26/2008  Job:  [id number]

## 2011-01-11 ENCOUNTER — Encounter: Payer: Self-pay | Admitting: Internal Medicine

## 2011-01-11 ENCOUNTER — Ambulatory Visit (INDEPENDENT_AMBULATORY_CARE_PROVIDER_SITE_OTHER): Payer: Medicare Other | Admitting: Internal Medicine

## 2011-01-11 ENCOUNTER — Ambulatory Visit (INDEPENDENT_AMBULATORY_CARE_PROVIDER_SITE_OTHER): Payer: Medicare Other | Admitting: *Deleted

## 2011-01-11 VITALS — BP 162/92 | HR 75 | Ht 75.0 in | Wt 288.0 lb

## 2011-01-11 DIAGNOSIS — I509 Heart failure, unspecified: Secondary | ICD-10-CM

## 2011-01-11 DIAGNOSIS — I359 Nonrheumatic aortic valve disorder, unspecified: Secondary | ICD-10-CM

## 2011-01-11 DIAGNOSIS — I4891 Unspecified atrial fibrillation: Secondary | ICD-10-CM

## 2011-01-11 DIAGNOSIS — I10 Essential (primary) hypertension: Secondary | ICD-10-CM

## 2011-01-11 DIAGNOSIS — Z954 Presence of other heart-valve replacement: Secondary | ICD-10-CM

## 2011-01-11 DIAGNOSIS — I5032 Chronic diastolic (congestive) heart failure: Secondary | ICD-10-CM

## 2011-01-11 NOTE — Assessment & Plan Note (Signed)
Continue longterm coumadin. 

## 2011-01-11 NOTE — Progress Notes (Signed)
The patient presents today for electrophysiology evaluation and consideration for afib ablation.  He has a h/o persistent atrial fibrillation.  He has previously been treated with tikosyn.  Unfortunately, he subsequently failed tikosyn and has returned to persistent atrial fibrillation.  He has recently been treated with rate control and anticoagulation with coumadin.   He states that he is doing reasonably well for his age.  He has back pain chronically which limits activity.  He also has chronic edema but has been intermittently complaint with demadex due to frequent urination with BPH.  He has SOB but feels that this has significantly improved with recent increases in rate control with coreg.  Presently, he denies symptoms of palpitations, chest pain,orthopnea, PND, dizziness, presyncope, syncope, or neurologic sequela.  The patient feels that he is tolerating medications without difficulties and is otherwise without complaint today.   Past Medical History  Diagnosis Date  . Altered mental status   . Hypokalemia   . Hyperlipidemia   . Atrial fibrillation     longterm persistent  . CAD (coronary artery disease)   . HTN (hypertension)    Past Surgical History  Procedure Date  . Aortic valve replacement   . Coronary artery bypass graft   . Thoracic t12 compression fracture     Current Outpatient Prescriptions  Medication Sig Dispense Refill  . acetaminophen (TYLENOL) 325 MG tablet 2 tabs po bid       . amoxicillin (AMOXIL) 500 MG capsule Take 500 mg by mouth as needed. Dental       . atorvastatin (LIPITOR) 40 MG tablet Take 40 mg by mouth daily.        . carvedilol (COREG) 25 MG tablet Take 1 tablet (25 mg total) by mouth 2 (two) times daily with a meal.  60 tablet  12  . digoxin (LANOXIN) 0.125 MG tablet Take 125 mcg by mouth daily.        . fish oil-omega-3 fatty acids 1000 MG capsule 2 tabs po qd       . metoCLOPramide (REGLAN) 10 MG tablet Take 10 mg by mouth 4 (four) times daily.         . Multiple Vitamin (MULTIVITAMIN) tablet Take 1 tablet by mouth daily.        . nitroGLYCERIN (NITROSTAT) 0.4 MG SL tablet Place 0.4 mg under the tongue every 5 (five) minutes as needed.        . potassium chloride (KLOR-CON) 8 MEQ CR tablet Take 8 mEq by mouth daily.        . ramipril (ALTACE) 10 MG capsule Take 1 tablet by mouth once a day  30 capsule  6  . Tamsulosin HCl (FLOMAX) 0.4 MG CAPS Take by mouth.        . torsemide (DEMADEX) 10 MG tablet Take 10 mg by mouth daily.        . vitamin B-12 (CYANOCOBALAMIN) 1000 MCG tablet Take 1,000 mcg by mouth daily.        [name] warfarin (COUMADIN) 5 MG tablet Take 1 tablet (5 mg total) by mouth as directed.  180 tablet  1    No Known Allergies  History   Social History  . Marital Status: Married    Spouse Name: N/A    Number of Children: N/A  . Years of Education: N/A   Occupational History  . Not on file.   Social History Main Topics  . Smoking status: Former Games developer  . Smokeless tobacco: Not on file  .  Alcohol Use: No  . Drug Use: Not on file  . Sexually Active: Not on file   Other Topics Concern  . Not on file   Social History Narrative  . No narrative on file    Family History  Problem Relation Age of Onset  . Hypertension    . Diabetes      ROS-  All systems are reviewed and are negative except as outlined in the HPI above    Physical Exam: Filed Vitals:   01/11/11 1005  BP: 162/92  Pulse: 75  Height: 6\' 3"  (1.905 m)  Weight: 288 lb (130.636 kg)    GEN- The patient is elderly, alert and oriented x 3 today.   Head- normocephalic, atraumatic Eyes-  Sclera clear, conjunctiva pink Ears- hearing intact Oropharynx- clear Neck- supple, no JVP Lymph- no cervical lymphadenopathy Lungs- Clear to ausculation bilaterally, normal work of breathing Heart- Regular rate and rhythm, Mechanical S2, rubs or gallops, PMI not laterally displaced GI- soft, NT, ND, + BS Extremities- no clubbing, cyanosis, 1+ edema  (chronic) MS- no significant deformity or atrophy Skin- no rash or lesion Psych- euthymic mood, full affect Neuro- strength and sensation are intact  EKG today reveals afib, V rate 75 bpm Echo from 9/11 reviewed   Assessment and Plan:

## 2011-01-11 NOTE — Assessment & Plan Note (Signed)
Stable No change required today  

## 2011-01-11 NOTE — Assessment & Plan Note (Signed)
The patient has valvular atrial fibrillation which is persistent.  He appears to be doing well presently with rate control.  He is appropriately anticoagulated with coumadin.  Therapeutic strategies for afib including rate and rhythm control were discussed in detail with the patient today.  We discussed amiodarone as an option for medical therapy.  Risk, benefits, and alternatives to EP study and radiofrequency ablation for afib were also discussed in detail today.At this time, the patient feels that his symptoms of afib are managed reasonably well with rate control.  He does not wish to pursue amiodarone or ablation. I think that this is reasonable.  If he decides upon rhythm control in the future, I would favor amiodarone over catheter ablation given his age and overall health.  If he were to fail amiodarone therapy then we could consider catheter ablation, however given his valvular persistent atrial fibrillation, I suspect success rates would be 50-60% and would likely require multiple procedures.

## 2011-01-11 NOTE — Assessment & Plan Note (Signed)
Above goal Salt restiction and compliance with demadex advised

## 2011-01-11 NOTE — Patient Instructions (Signed)
Your physician recommends that you schedule a follow-up appointment in: KEEP APPTS AS SCHEDULED  Your physician recommends that you continue on your current medications as directed. Please refer to the Current Medication list given to you today.

## 2011-01-12 NOTE — Assessment & Plan Note (Signed)
[facility]                            CARDIOLOGY OFFICE NOTE   NAME:Mceachin, ELAINE MIDDLETON                      MRN:          [id number]  DATE:08/26/2006                            DOB:          November 22, 1933    Mr. Stolp returns today for followup.  He has multiple issues.  He is  status post CABG with aortic valve replacement.   He has not had any significant angina.  He had a cardiac CT done about a  year ago which showed a 40% plaque in his vein graft to the right  coronary artery, and otherwise his grafts were patent.  His aortic valve  replacement is functioning normally by echo.  The most recent one was in  January of 2006.  He has good left ventricular function.  He has been  going to the Coumadin clinic with good anticoagulation.   Most of his issues have revolved around risk factor modification.  He  has had labile blood pressures.  I had him take a bunch of home blood  pressure readings, and they have consistently been elevated in the 150-  170 systolic range.  His pulse tends to be in the 60 range, and I do not  think we can increase his Lopressor anymore.  He is already on low-dose  hydrochlorothiazide for some lower extremity edema.  I told him at this  point I thought the best thing to do was to add Hyzaar 100/25 and to  stop his hydrochlorothiazide.   He will start taking this, and we will recheck his blood pressure in  about 8 weeks.   His last LDL cholesterol was in excess of 110.  He was unable to  tolerate Vytorin.  It is not clear to be whether it was the Zetia or the  statin component.  He had been on Zocor in the past, and this was  stopped apparently by his primary M.D. because it was not effective.  I  told him for the time being that we would start him on Lipitor 40 a day,  and if he had any side effects, then we would switch him back over to  Zetia, although this would be less optimal.   CURRENT MEDICATIONS:  1. Coumadin as  directed by the clinic.  2. Metoclopramide 10 b.i.d.  3. Hydrochlorothiazide 25 a day.  4. Pepcid b.i.d.  5. Vytorin 10/40 (which has been stopped).  6. Lopressor 25 b.i.d.  7. Fish oil.   PHYSICAL EXAMINATION:  GENERAL:  He is overweight but stable.  HEENT:  Normal.  NECK:  Carotids are normal.  JVP is not elevated.  LUNGS:  Clear.  There is an S1 and S2 click with a soft systolic murmur.  There is no aortic insufficiency.  ABDOMEN:  Benign.  EXTREMITIES:  Lower extremities have trace edema.  SKIN:  Warm and dry.  NEUROLOGIC:  Nonfocal.   IMPRESSION:  Stable, status post coronary artery bypass grafting with  aortic valve replacement, no angina, and normal functioning valve, good  anticoagulation.  Hypertension to be treated now with ARB as a  combination pill with his diuretic.  Continue beta blocker therapy,  given his coronary disease and hypertension.   Hypercholesterolemia, suboptimally treated, particularly in a patient  with some soft plaque and an old bypass graft.  Start Lipitor 40 mg a  day to see if the patient tolerates it.  If he does, we can try to  escalate the dose to 80 mg.  If he does not, he will need to be on some  combination of Niaspan or Zetia.   I will see him back in about 8 weeks.     [name]. [name], MD, [facility]  Electronically Signed    PCN/MedQ  DD: 08/26/2006  DT: 08/26/2006  Job #: [id number]

## 2011-01-12 NOTE — Cardiovascular Report (Signed)
NAMEKHALE, NIGH                         ACCOUNT NO.:  [id number]   MEDICAL RECORD NO.:  [id number]                   PATIENT TYPE:  OIB   LOCATION:  2899                                 FACILITY:  [facility]   PHYSICIAN:  Charlton Haws, MD [facility]                DATE OF BIRTH:  07-Apr-1934   DATE OF PROCEDURE:  DATE OF DISCHARGE:  06/04/2002                              CARDIAC CATHETERIZATION   PROCEDURE:  Coronary arteriography.   INDICATIONS FOR PROCEDURE:  Abnormal Cardiolite study, chest pain, aortic  stenosis.   PROCEDURAL NOTE:  Left __________ catheterization was done from the right  femoral artery and vein.   FINDINGS:  1. The left main coronary artery had a 30% discrete stenosis.  2. The left anterior descending artery was moderately calcified.  There was     a 40% tubular lesion proximally.  There was an 80% mid vessel lesion at     the takeoff of the first diagonal branch.  This was surrounded by two     aneurysmal segments.  After the aneurysmal segments, there was another     area of 80% discrete stenosis in the mid LAD.  3. The first diagonal branch was somewhat small with 70% multiple discrete     lesions.  4. The circumflex coronary artery was a large caliber vessel.  There was a     70-80% discrete lesion in the mid portion.  5. There was one large obtuse marginal branch.  The distal segment of this     had 80% bifurcation lesion.  6. The right coronary artery was relatively small but did contain a PDA.     There was a 40% tubular lesion proximally.  7. Right heart catheterization was performed due to the patient's dyspnea     and aortic stenosis.  Mean RA pressure was 12.  Mean RV pressure was     39/9.  PA pressure was 40/17.  Mean pulmonary capillary wedge pressure     was 20.  8. RAO ventriculography revealed normal LV function with an EF of 65%.  LV     pressure was 168/12.  Aortic pressure was 161/75.  This gave only a 7-10     mm gradient,  peak-to-peak.  9. By echocardiography, the patient had mild AS.  His maximum gradient was     30 mmHg with a mean gradient of 13.8 mmHg.  10.      The valve had significant calcification and some restricted motion     by echocardiography.  The aortic valve area by Doppler estimation and     echocardiography was 1.5 sq cm.   IMPRESSION:  I will have to review the films with Dr. Juanda Chance.  My gut  feeling is that revascularization would be best served with open heart  surgery; however, the complicating factor for this would be making the  decision about whether or not  to replace his aortic valve.  He is really on  the borderline in terms of having a mean gradient of 15; however, his valve  is calcified with restricted motion.   The worst case scenario would be to send him for open heart surgery and then  realize that we needed to replace his valve in five years; however, this  raises the stakes quite a bit in terms of a decision between intervention  percutaneously and open heart surgery since percutaneous intervention would  not have the added complication of an aortic valve replacement.   Again, since this is a difficult decision to make, we will take the patient  off the table and admit him to the hospital for 24-hour observation and I  will have the interventional doctors review these films.                                               Charlton Haws, MD [facility]    PN/MEDQ  D:  06/04/2002  T:  06/07/2002  Job:  [id number]

## 2011-01-12 NOTE — Assessment & Plan Note (Signed)
[facility]                            CARDIOLOGY OFFICE NOTE   NAME:[name], [name] KUEHL                      MRN:          [id number]  DATE:12/19/2006                            DOB:          10/13/1933    [name] returns today for followup.  He is status post CABG with AVR.  The last time I saw him in March, I was concerned about his blood  pressure.  He was started on Norvasc.   However, today he has multiple complaints.  The most worrisome seems to  be some generalized increase in fatigue, some muscle weakness,  dizziness, and general feeling of malaise.   A couple of months ago he had a prolonged URI with a sore throat.  He  was not given antibiotics for it.   In talking to [facility], he has not had any specific fevers.  He has not  had sputum production, and symptoms referable to a UTI.  He has not had  excessive diuresis.   There has been no other stigmata of SBE.   The patient had been on Vytorin, and this was switched to Lipitor.  I am  not sure if there is a correlation between this and his weakness.  His  fatigue is more generalized.  He does get some exertional dyspnea.  He  cannot walk along the track with his wife as much as he used to.   In regards to his hypertension, he brought records with him today.  They  do seem improved.  In particular, diastolic pressures are down about 10  mm of mercury.  I think the Norvasc has been helpful.   REVIEW OF SYSTEMS:  Remarkable for no cough, no sputum production, no  fevers, and is otherwise unremarkable.   MEDICATIONS:  Include:  1. Coumadin as directed.  2. Metoclopramide 10 b.i.d.  3. Hydrochlorothiazide 25 a day.  4. Pepcid b.i.d.  5. Fish oil.  6. Lipitor 40 a day.  7. Altace 10 b.i.d.  8. Norvasc 5 a day.  9. Lopressor 50 b.i.d.   EXAMINATION:  There is no stigmata of SBE.  Blood pressure is 128/70.  He is not postural.  The blood pressure is  equal in both arms using a large  cuff.  His pulse is 68 and regular.  Carotids are without bruit.  There is no thyromegaly.  LUNGS:  Clear.  There is an S1 and S2 with a systolic ejection murmur.  ABDOMEN:  Benign.  EXTREMITIES:  Intact pulses.  No edema.  NEUROLOGIC:  Nonfocal.  SKIN:  Warm and dry.   IMPRESSION:  Multiple constitutional symptoms, in particular fatigue,  myalgias, and dizziness.  The patient will have an MRI with Gadolinium  to rule out occult stroke and cerebral vascular accident.  He will  follow up with Dr. Janey Greaser for possible vertigo.  The patient's left  ventricular is good.  He has had a recent echo that shows his valve is  functioning well, however, I am a little bit concerned about possible  SBE.  He will have 2 sets of blood cultures drawn,  a CBC to rule out  anemia since he is on Coumadin, and also a sedimentation rate.   His blood pressure is better controlled with the addition of Norvasc.   The patient needs a followup stress test, but I would like to work  through some of these other issues before he has it.   The patient will continue to record his blood pressures at home, and we  will monitor this.   I will have him stop his Lipitor for now.  The insidious onset of some  weakness and fatigue may be related to his statin.   Further recommendations will be based on the results of his MRI, blood  work, and response to stopping his statin drug.   I will see him back in 4-6 weeks.     [name]. [name], MD, [facility]  Electronically Signed    PCN/MedQ  DD: 12/19/2006  DT: 12/19/2006  Job #: [id number]

## 2011-01-12 NOTE — Assessment & Plan Note (Signed)
[facility]                            CARDIOLOGY OFFICE NOTE   NAME:[name], [name]                      MRN:          [id number]  DATE:10/08/2006                            DOB:          1934/01/21    [name] returns today in followup.  He is status post CABG with AVR.  He has significant hypertension and hypercholesterolemia.   We have had some issues in regards to what his insurance will cover.  We  wanted him on Hyzaar, but apparently this was going to cost him over 100  dollars a month, and we, therefore, had placed him on Lisinopril 40 with  25 hydrochlorothiazide.  His blood pressure is still somewhat  suboptimally controlled.  A lot of his blood pressure readings are in  the mid 140s/85.  He does have occasional readings with a systolic of  160.   I told him that it may be worthwhile to switch him to Altace 10 b.i.d.  This is the max dose for hypertension, and it just went generic.   He is tolerating his Lipitor.  We placed him on this for  hypercholesterolemia.  He previously had myalgias, and did not feel well  on Vytorin.   In regards to his aortic valve and CABG, he has not had any significant  chest pain, PND or orthopnea.  He does get some exertional dyspnea,  which I think is related to his deconditioning.   His weight continues to be too high.  He is eating less, but continues  to be physically inactive.   His Coumadin levels have been therapeutic.   His medications include:  1. Coumadin as directed.  2. Metoclopramide 10 b.i.d.  3. Hydrochlorothiazide 25 a day.  4. Pepcid b.i.d.  5. Lipitor 40 a day.  6. Lisinopril 40 a day.  7. Lopressor 50 a day.   His exam is remarkable for a blood pressure of 148/78.  Pulse is 65 and  regular.  HEENT:  Normal.  Carotids are normal without bruit.  LUNGS:  Clear.  There is an S1 and S2 click with a soft systolic murmur.  There is no  aortic insufficiency.  ABDOMEN:  Benign.  LOWER EXTREMITIES:  Intact pulses.  No edema.   IMPRESSION:  In regards to the patient's blood pressure, we will stop  his Lisinopril and switch him over to Altace 10 b.i.d.  Hopefully, this  will be a generic drug as well.   I will see him back in 4 weeks to reassess his blood pressure.   In regards to the patient's aortic valve, it sounds normal.  His left  ventricular function has been normal postoperatively, and will continue  to follow up in the [facility] for his anticoagulation.   In regards to the patient's coronary disease and previous bypass grafts,  he will continue his beta blocker.  His resting heart rate is in the mid  60s, which is ideal.   He seems to be tolerating his Lipitor.  He will get a fasting lipid  profile and liver tests per Dr. Janey Greaser in March.  I will see him back in about 4 weeks to reassess his blood pressure.   He is already taking a beta blocker, ACE inhibitor and a diuretic, and I  would not like to add a 4th drug for blood pressure control if we can  help it.     [name]. [name], MD, [facility]  Electronically Signed    PCN/MedQ  DD: 10/08/2006  DT: 10/08/2006  Job #: [id number]

## 2011-01-12 NOTE — Op Note (Signed)
[name], [name]                         ACCOUNT NO.:  [id number]   MEDICAL RECORD NO.:  [id number]                   PATIENT TYPE:  INP   LOCATION:  2305                                 FACILITY:  [facility]   PHYSICIAN:  Burna Forts, M.D.             DATE OF BIRTH:  1934/08/07   DATE OF PROCEDURE:  06/16/2002  DATE OF DISCHARGE:                                 OPERATIVE REPORT   PROCEDURE PERFORMED:  Intraoperative transesophageal echocardiogram.   ANESTHESIOLOGIST:  Burna Forts, M.D.   INDICATIONS FOR PROCEDURE:  The patient is a 77 year old  gentleman, a  patient of Dr. Tressie Stalker who presents today for coronary artery bypass  grafting and possible aortic valve replacement.  He has been known to have  coronary artery disease and mild-to-moderate aortic stenosis.  We planned to  bring him to the holding area the morning of surgery where under local  anesthesia a radial artery and pulmonary artery catheters were inserted.   DESCRIPTION OF PROCEDURE:  The patient was then brought to the OR for  routine induction of general anesthesia after which the trachea was  intubated.  Then TE probe was subsequently pass oropharyngeally into the  stomach and withdrawn for imaging of the cardiac structures.   Pre-cardiopulmonary Bypass TE Examination:  Left ventricle:  This is a concentrically hypertrophied left ventricular  chamber with notable hypertrophy in the short axis view.  There was overall  excellent contractility with all walls thickening and moving in during  systolic contraction.  Long axis view again demonstrated the anterior and  posterior wall contractility, which was good.  There were no massed noted  within,   Mitral valve:  This is a mildly thickened mitral valve apparatus.  It  appears to be moving well in a four-chamber view.  Later after surgery had  begun we were able to visualize the posterior leaflet and inside this  revealed a slight degree of  prolapse of the posterior leaflet or a scallop  thereof over the top of the anterior leaflet of the mitral valve.  Doppler  examination done in several views of this, however, revealed only minimal-to-  trace mitral regurgitant flow.   Aortic valve:  The aortic valve was obviously abnormal in its function;  however, both left and right coronary cusps appeared essentially normal.  There was, however, a large mis-shapened clearly abnormally functioning  noncoronary cusp with heavy calcium noted within.  The actual diameter of  the opening during planimetry measured in approximately 1.2-1.4 cm squared,  which would be consistent with only mild-to-moderate aortic stenosis.  On  Doppler examination there was some degree of a turbulent flow noted during  systolic ejection in the long axis view.  There was trace aortic  insufficiency noted across this valve.  Primary note was the noncoronary  cusp appeared to be fixed with heavy calcium noted.   Left atrium:  Shows a  normal left atrial chamber.  No masses were noted  within.  The intra-atrial septum was intact.   Right ventricle:  Normal heavily trabeculated right ventricular chamber.  Normal in size, shape and function.   Tricuspid valve:  This is a normal valve.   Right atrium:  Normal right atrial chamber.  Pulmonary artery catheter was  noted within.   The patient was placed on cardiopulmonary bypass and hypothermia was begun.  Coronary artery bypass grafting was carried out as well as excision of the  diseased aortic valve by Dr. Cornelius Moras followed by replacement with a [employer] Jude  bivalvular apparatus.  The aortotomy was closed.  Deairing maneuvers were  carried out and the patient separated from cardiopulmonary bypass with the  initial attempt.   Post Cardiopulmonary Bypass TE Examination (limited exam):  Left ventricle:  In the area of bypass there remained good overall  contractility of the left ventricular chamber in all dimensions.   Both short  axis and long axis views revealed contractile myocardium.   Aortic valve:  In place of the diseased aortic valve could now be seen the  bivalvular leaflets of the [employer] Jude apparatus that appeared to be opening  well during systolic contraction and closing appropriately during diastole.  Doppler examination along the long axis revealed no obstruction to flow and  essentially no regurgitant flow noted.   The rest of the cardiac examination was as previously described without any  significant changes and the patient was returned to the cardiac intensive  care unit in stable condition.                                                 Burna Forts, M.D.    JTM/MEDQ  D:  06/16/2002  T:  06/16/2002  Job:  [id number]   cc:   Anesthesia Department

## 2011-01-12 NOTE — Op Note (Signed)
[name], [name]                         ACCOUNT NO.:  [id number]   MEDICAL RECORD NO.:  [id number]                   PATIENT TYPE:  INP   LOCATION:  2305                                 FACILITY:  [facility]   PHYSICIAN:  Salvatore Decent. Cornelius Moras, M.D.              DATE OF BIRTH:  [birth year]   DATE OF PROCEDURE:  06/16/2002  DATE OF DISCHARGE:                                 OPERATIVE REPORT   PREOPERATIVE DIAGNOSES:  1. Severe three-vessel coronary artery disease.  2. Mild aortic stenosis.  3. Class III progressive angina and shortness of breath.   POSTOPERATIVE DIAGNOSES:  1. Severe three-vessel coronary artery disease.  2. Mild aortic stenosis.  3. Class III progressive angina and shortness of breath.   OPERATION PERFORMED:  1. Median sternotomy for aortic valve replacement (23 mm St. Jude Regent),     and;  2. Coronary artery bypass grafting times five (left internal mammary artery     to distal left anterior descending coronary artery, saphenous vein graft     to the first diagonal branch, saphenous vein graft to the medial sub-     branch of the second circumflex marginal branch, sequential saphenous     vein graft to the lateral sub-branch of the second circumflex marginal     branch, and saphenous vein graft to posterior descending coronary     artery).   SURGEON:  Salvatore Decent. Cornelius Moras, M.D.   ASSISTANT:  Coral Ceo, PA   ANESTHESIA:  General.   BRIEF CLINICAL NOTE:  The patient is a 77 year old  gentleman followed by  Dr. Doran Clay and referred by Dr. Charlton Haws for management of  coronary artery disease and mild aortic stenosis.  He presented with Class  III symptoms of exertional shortness of breath.  Cardiac catheterization  demonstrated severe three-vessel coronary artery disease with normal left  ventricular function and mild aortic stenosis.  Echocardiogram confirmed the  presence of mild aortic stenosis.  A full consultation note was dictated  previously.   The patient and his wife have been counseled at length  regarding the indications and potential benefits of surgical intervention  for coronary artery bypass grafting and concomitant aortic valve  replacement.  Alternative treatment strategies have been discussed in  detail.  They understand and accept all associated risks of surgery  including, but not limited to risk of death, stroke, myocardial infarction,  respiratory failure, bleeding requiring blood transfusion, renal failure,  arrhythmia, heart block or bradycardia requiring permanent pacemaker,  infection, recurrent coronary artery disease, and complications related to  prosthetic aortic valve replacement.  All their questions have been  addressed.   OPERATIVE NOTE IN DETAIL:  The patient was brought to the operating room on  the above-mentioned date and central monitoring was established by the  anesthesia service under the care and direction of Dr. Sharee Holster.  Specifically, a Swan-Ganz catheter was placed through the right  internal  jugular approach.  A radial arterial line was placed.  Intravenous  antibiotics were administered.  Following induction with general  endotracheal anesthesia a Foley catheter was placed.  Transesophageal  echocardiogram was performed by Dr. Jacklynn Bue.  This confirmed the presence  of normal left ventricular function with mild concentric left ventricular  hypertrophy.  There was mild aortic stenosis.  Two of the three cusps of the  aortic valve appeared entirely normal; however, the noncoronary cusp was  severely sclerotic and had a large calcification within it.  This leaflet  does not open and close well at all, and because of this there is associated  mild aortic stenosis with estimated aortic valve cross sectional area by  planimetry between 1.2 and 1.4 squared centimeters.  Additional findings at  echocardiogram included the presence of mitral valve prolapse without any  associated mitral  regurgitation.  This prolapse is primarily involving the  posterior leaflet of the mitral valve.  No other significant abnormalities  were identified.   The patient's chest, abdomen,  both groins and both lower extremities were  prepared and draped in a sterile manner.  A median sternotomy incision was  performed and the left internal mammary artery was dissected from the chest  wall and prepared for bypass grafting.  The left internal mammary artery was  notably a good quality conduit.  Simultaneously saphenous vein was obtained  from the patient's right thigh and the right lower leg using endoscopic vain  harvest technique.  The saphenous vein was somewhat large caliber, but  otherwise a good quality conduit.  The patient was heparinized systemically.   The pericardium was opened.  The ascending aorta was mildly dilated, but  otherwise normal in appearance.  There were no palpable plaques or  calcifications.  The ascending aorta and the right atrium were cannulated  for cardiopulmonary bypass.  A retrograde cardioplegia catheter was placed  through the right atrium into the coronary sinus.  Adequate heparinization  was verified.  Cardiopulmonary bypass was begun and the surface of the heart  was inspected.  Distal sites were selected for coronary bypass grafting.  Portions of the saphenous vein and the left internal mammary artery were  trimmed to appropriate length.  A temperature probe was placed in the left  ventricular septum.  A left ventricular vent was placed through the right  superior pulmonary vein.  Cardioplegic catheter was placed in the ascending  aorta.  The patient was cooled to 30 degrees systemic temperature.  The  aortic cross clamp was applied and cardioplegia was delivered initially in  an antegrade fashion through the aortic root.  Iced saline slush was applied for topical hypothermia.  The initial cardioplegic arrest and myocardial  cooling were felt to be  excellent.  Supplemental cardioplegia was  administered retrograde through the coronary sinus catheter.  Repeat doses  of cardioplegia were administered intermittently throughout the cross clamp  portion of the operation, antegrade through the aortic root, antegrade  through subsequently placed vein grafts and retrograde through the coronary  sinus catheter to maintain septal temperature below 5 degrees centigrade.  The following distal coronary anastomoses were performed.   #1 The posterior descending coronary artery was grafted with a saphenous  vein graft in an end-to-side fashion.  This coronary artery measures 1.6 mm  in diameter at the site of distal bypass and was of good quality.   #2 The medial sub-branch of the second circumflex marginal branch was  grafted with saphenous vein  graft in end-to-side fashion.  The second  circumflex marginal branch was a very large vessel, which bifurcates.  The  medial sub-branch measured 1.7 mm at the site of distal bypass and was of  good quality.   #3 The lateral sub-branch of the second circumflex marginal branch was  grafted using the sequential saphenous vein graft off of the vein placed in  the medial sub-branch.  This coronary measured 1.4 mm at the site of distal  bypass and was of good quality.   #4 First diagonal branch off the left anterior descending coronary artery  was grafted with a saphenous vein graft in an end-to-side fashion.  This  coronary measured 1.4 mm in diameter and was of good quality at the site of  distal bypass.   #5 The distal left anterior descending coronary artery was grafted with the  left internal mammary artery in an end-to-side fashion.  This coronary  measured 2.0 mm in diameter and was of good quality.   An oblique transverse aortotomy was performed and the aortic valve was  exposed and examined.  The aortic valve was tricuspid.  There was severe  sclerosis and mild calcification involving the  noncoronary cusp of the  aortic valve.  As a result this leaflet does not open and close much at all.  The remaining left and right coronary cusps appear essentially normal.  The  aortic valve was excised.  There was normal coronary ostial anatomy,  although the ostium of the right coronary artery is located somewhat  eccentrically towards the commissure between the right and noncoronary cusps  of the aortic valve.  The aortic annulus was decalcified.  This was required  only in the portion of the annulus along the noncoronary cusp.  The aortic  annulus was then sized to accept a 23 mm St. Jude Regent valve.  Aortic root  and left ventricular chamber were irrigated with copious iced saline  solution to rinse any potential particulate matter.  Aortic valve  replacement was then performed using interrupted 2-0 Ethibond horizontal  mattress sutures with pledgets in the subannular position placed circumferentially around the annulus of the aortic valve. A 23 mm St. Jude  Regent valve is secured in place uneventfully.  Care was taken to make sure  that the valve seated appropriately and there was plenty of distance between  the valve filling ring and the ostium of the left main and right coronary  arteries.  The leaflets open and close appropriately without any signs of  impingement.   Rewarming was begun.  The aortotomy was closed using the standard two-layer  closure with running 4-0 Prolene suture.  All three proximal saphenous vein  anastomoses were performed directly to the ascending aorta prior to removal  of the aortic cross clamp.  The septal temperature was noted to rise rapidly  and dramatically upon reperfusion of the left internal mammary artery.  The  patient was placed in Trendelenburg position.  One final dose of warm  retrograde hot shot cardioplegia was administered.  The lungs were  ventilated while the heart was filled to allow evacuation of any residual  air from the  pulmonary veins and left heart circulation.  The aortic root  was vented using a Magoon needle.  The aortic cross clamp was removed after  a total cross clamp time of 134 minutes.  The heart began to beat  spontaneously without need for cardioversion.  All proximal and distal  anastomoses were inspected for hemostasis  in appropriate graft orientation.  The retrograde cardioplegic catheter and the left ventricular vent were  removed.  Epicardial pacing wires were fixed to the right ventricular  outflow tract and to the right atrial appendage.  The patient was rewarmed  for greater than 37 degree centigrade temperature.  The patient was weaned  from cardiopulmonary bypass without difficulty.  The patient's rhythm at  separation from bypass was normal sinus rhythm.  Atrial pacing was employed  to increase the heart rate.  No inotropic support was required.  Total  cardiopulmonary bypass time for the operation was 172 minutes.   The venous and arterial cannulae were removed uneventfully.  The aortic root  vent was removed.  Protamine was administered to reverse the  anticoagulation.  Mediastinum and the left chest were irrigated with saline  solution containing vancomycin.  Meticulous surgical hemostasis was  ascertained.  Mediastinum and the left chest were drained with three chest  tubes placed through separate stab incisions inferiorly.  The median  sternotomy was closed in routine fashion.  The  right lower extremity  incisions were closed in multiple layers in routine fashion.  All skin  incisions were closed with subcuticular skin closures.   Follow up transesophageal echocardiogram was performed by Dr. Jacklynn Bue after  separation from cardiopulmonary bypass demonstrating normal left ventricular  function with a well seated aortic valve prosthesis.  There was no aortic  regurgitation.  There was no mitral regurgitation.  There was insignificant residual air in the left heart circulation.   No other abnormalities were  identified.   The patient tolerated the procedure well and was transported to the surgical  intensive care unit in stable condition.  There were no intraoperative  complications.  No blood products were administered.                                                 Salvatore Decent. Cornelius Moras, M.D.    CHO/MEDQ  D:  06/16/2002  T:  06/16/2002  Job:  [id number]   cc:   Charlton Haws, MD [facility]   Al Decant. Janey Greaser, M.D.  [address]  [place]  [place] [address]  Fax: [phone]

## 2011-01-12 NOTE — Assessment & Plan Note (Signed)
[facility]                            CARDIOLOGY OFFICE NOTE   NAME:[name], [name]                      MRN:          [id number]  DATE:11/05/2006                            DOB:          06/17/1934    [name] returns today for followup.  He is status post CABG and AVR.  He has been unable to lose any weight.  His blood pressure continues to  be elevated.  We have maximized his current medications at Altace 10  b.i.d.  He is on the diuretic and is euvolemic and on a beta blocker  with the heart rate in the high 50s.   I told him that, given his previous CABG and his aortic valve  replacement, we needed better control of his blood pressure.  He will be  prescribed generic Norvasc, in addition to his current medications.   He will continue to watch the salt in his diet and try to be active.   EXAM:  Blood pressure today is 158/90.  HEENT is normal.  Carotids have  a transmitted murmur.  Lungs are clear.  There is an S1 and S2 click  with a soft systolic murmur.  There is no AI.  Abdomen is benign.  Lower  extremities with intact pulses, no edema.   IMPRESSION:  Stable, status post CABG with AVR.  No recurrent chest  pain.  Valve functioning normally by echo.  The patient will continue to  follow up in the Coumadin Clinic.  His levels have been off a little bit  lately.  In particular, they have been low, and he has been on higher  dosages.   The patient will have Norvasc added to his current three-drug regimen  for hypertension.  I will see him back in about four weeks to reassess  this.   He needs a followup echo in about a year.   Overall, as long as we can get his blood pressure under better control,  I think he will do fine.     [name]. [name], MD, [facility]  Electronically Signed    PCN/MedQ  DD: 11/05/2006  DT: 11/07/2006  Job #: [id number]

## 2011-01-12 NOTE — Assessment & Plan Note (Signed)
[facility]                              CARDIOLOGY OFFICE NOTE   NAME:[name], [name]                      MRN:          [id number]  DATE:06/04/2006                            DOB:          [birth year]    [name] returns today for followup.  He is status post CABG and aortic valve  replacement.  He had a cardiac CT in January of 2006, which showed patent  grafts with some moderate soft plaque in the vein graft to the right.  He  has had relative bradycardia in the past.  He has not had any syncope, but  we have cut back on his beta blocker dose.  He has been having some  dizziness in the morning, as well as some nausea.  He has a very distant  history of peptic ulcer disease, but is on Reglan and an H2 blocker.  I am  not sure what to make of his abdominal pains.  He is on Coumadin and has not  had any significant melena or bloody stools.  He is on Vytorin and it may be  the Zetia component that is causing his stomach upset.   Given his morning light-headedness and abdominal pain, I think it is  reasonable to stop his Vytorin for a month and cut back on his beta blocker  to 25 b.i.d.  I told him, so long as his resting heart rate did not go much  higher than 75-80 that this would be reasonable.  I do not think that there  is any indication currently for repeat endoscopy.   He is otherwise doing well.  He is active.  He is not getting any  significant chest pain.  There have been no fevers.  No rash.  No congestive  heart failure.   EXAM:  He is overweight.  Blood pressure is 146/80, pulse is 56 and regular.  LUNGS:  Clear.  Carotids are normal.  There is no thyromegaly.  There is no lymphadenopathy.  There is an S1, S2 click with a soft systolic murmur.  No diastolic murmur  of AI.  ABDOMEN:  Benign.  LOWER EXTREMITIES:  Intact pulses.  No edema.   EKG is essentially normal sinus bradycardia.   His medications are listed in the chart.  He is  on:  1. Lopressor 50 b.i.d. to decrease to 25 b.i.d.  2. Coumadin as directed by our clinic.  3. Reglan 10 b.i.d.  4. Hydrochlorothiazide 25 a day.  5. Pepcid and Vytorin 10/40.   IMPRESSION:  Stable coronary artery disease status post aortic valve  resection, mild to moderate soft plaque in the vein graft to the right by  cardiac CT a year ago.  Followup Myoview in 6 months.  Decrease beta blocker  to see if morning light-headedness improves.  Relative chronotropic  incompetence.   The patient will have his Vytorin stopped to see if his abdominal discomfort  improves off of the Zetia component.  I will see him back in about a month  to recheck his symptoms and heart rate.  ______________________________  [name] [name], MD, [facility]     PCN/MedQ  DD:  06/04/2006  DT:  06/05/2006  Job #:  [id number]

## 2011-01-12 NOTE — Assessment & Plan Note (Signed)
[facility]                            CARDIOLOGY OFFICE NOTE   NAME:Blixt, KESHUN BERRETT                      MRN:          [id number]  DATE:09/14/2008                            DOB:          [birth year]    [name] returns today for followup.  He had a prolonged  hospitalizations at the end of December for a ruptured disk in his back.  He had significant delirium and pain.  He eventually had this fixed in  the hospital.  He had recurrent bouts of paroxysmal atrial fibrillation.  He was on chronic Coumadin due to his AVR.   He was sent home on sotalol and seems to be maintaining sinus rhythm.   He has had some atypical pains in his chest.  He has exertional dyspnea  at the time of his AVR many years ago.  He did have significant two-  vessel coronary disease with complex LAD diagonal disease as well as  circ in 40% right.  His last Myoview was in 2006, and I think he should  probably have a repeat adenosine Myoview.  He cannot walk on treadmill  sufficiently to have an exercise treadmill.   Otherwise, his aortic valve has been functioning normally by echo.  His  INRs have been therapeutic.   He did not have any problems off his Coumadin in regards to his back  surgery.  We did not overlapping with Lovenox.   His hypertension has been fairly well controlled.  He seems to have had  improvement with his back surgery and currently is not having any  significant pain.  We decrease the dose of his beta-blocker in the  hospital since he is on sotalol.  Now, we will continue his sotalol for  a month or so, and I will probably try to get him off this at some point  in the future.  His atrial fibrillation is likely related to the acute  shock of his back injury and delirium during his acute illness.   CURRENT MEDICATIONS:  1. Klor-Con 4 mEq day.  2. Sotalol 80 b.i.d.  3. Amlodipine 10 a day.  4. Hydrochlorothiazide 25 a day.  5. Lopressor 12.5  b.i.d.  6. Metoclopramide.   PHYSICAL EXAMINATION:  GENERAL:  Remarkable for an overweight male in no  distress.  Affect is appropriate.  VITAL SIGNS:  Weight 279, blood pressure 117/72, pulse 64 and regular,  respiratory rate 14, and afebrile.  HEENT:  Unremarkable.  Carotids are normal without bruit.  No  lymphadenopathy, thyromegaly, or JVP elevation.  LUNGS:  Clear.  Good diaphragmatic motion.  No wheezing.  CARDIAC:  S1 and S2 click with a soft systolic murmur.  No AI.  PMI  normal.  ABDOMEN:  Benign.  Bowel sounds positive.  No AAA, no tenderness, no  bruit.  No hepatosplenomegaly or hepatojugular reflux.  EXTREMITIES:  Distal pulses are intact.  No edema.  NEUROLOGIC:  Nonfocal.  SKIN:  Warm and dry.  MUSCULOSKELETAL:  No muscular weakness.   IMPRESSION:  1. Aortic valve replacement, sounds normal, functioning normal by  echo.  Continue SBE prophylaxis.  2. Coronary artery disease with atypical chest pain, exertional      dyspnea.  Follow up adenosine Myoview.  3. Paroxysmal atrial fibrillation.  Continue Coumadin and sotalol will      be continued for the short-term.  He will call me if he has any      recurrent palpitations.  4. Hypertension currently well controlled.  Continue current dose of      amlodipine, hydrochlorothiazide, and metoprolol.  5. Recent back surgery with improvement.  Continue attempts at weight      loss.  Follow up with a neurosurgeon.  6. Anticoagulation with Coumadin and follow up in our clinic.  He has      been therapeutic.  He did not have any problems off his Coumadin      without Lovenox overlap, but despite his paroxysmal atrial      fibrillation and AVR.     [name]. [name], MD, [facility]  Electronically Signed    PCN/MedQ  DD: 10/15/2008  DT: 10/16/2008  Job #: [id number]

## 2011-01-12 NOTE — Assessment & Plan Note (Signed)
[facility]                              CARDIOLOGY OFFICE NOTE   NAME:[name], [name] GIESKE                      MRN:          [id number]  DATE:07/05/2006                            DOB:          Nov 20, 1933    [name] returns today for followup.  He is status post CABG with aortic valve  replacement.  The last time I saw him, he had some early morning  lightheadedness.  This is improved, off his Vytorin and cutting back on his  Lopressor.  However, the patient needs to be on hypercholesterolemic agent.  He has old bypass grafts, and we did a CT scan on him last year, which did  show some soft plaque in the right coronary artery graft.   The patient's blood pressure has also been up since we cut back his  Lopressor.  I explained to him that we need to do some home blood pressure  readings.   I am glad that his early morning lightheadedness is gone, but we need to  further address his lipids and his blood pressure.  He has a prosthetic St.  Jude aortic valve in place.  From that perspective, his Coumadin's have been  good.  He has not had any bleeding problems.  No TIA's or CVA's.   REVIEW OF SYMPTOMS:  Otherwise benign.  Specifically, he has not had bad  headaches or evidence of embolic disease.  His weight continues to be up,  and he needs to have more activity.  He is watching his diet.   MEDICATIONS:  1. Coumadin as directed.  2. Reglan 10 b.i.d.  3. Hydrochlorothiazide 25 a day.  4. Pepcid.  5. Vytorin 10/40, which he will restart.  6. Lopressor 25 b.i.d.   EXAMINATION:  HEENT:  Normal.  SKIN:  Warm and dry.  There is no lymphadenopathy.  There is no thyromegaly.  There are no carotid  bruits.  LUNGS:  Clear.  There is an S1 and S2 click with a soft systolic murmur.  There is no aortic insufficiency.  ABDOMEN:  Benign.  There is no AAA.  Distal pulses are intact with no edema.  NEUROLOGIC:  Nonfocal.  Blood pressure was 165/89.  Pulse 61  and regular.  Weight is 289.   IMPRESSION:  1. Stable status post coronary artery bypass graft and aortic valve      replacement.  CT last year with patent grafts, but 40% or so soft      plaque in the right coronary artery graft.  No angina.  2. Aortic valve functioning normally with good anticoagulation in the      Coumadin Clinic.  3. Hypercholesterolemia.  Restart Vytorin.  If he becomes symptomatic on      it, we will have to try to do an isolated Statin drug as opposed to      Zetia and a Statin drug which is in Vytorin.  He will monitor his home      blood pressure readings.  I suspect that if his blood pressure runs      high, he may  be able to tolerate some low-dose Lisinopril in addition      to his beta blocker.  4. Overall, [name]'s quality of life is good.  He and his wife seem to be      enjoying themselves.  I will see him back in about 8 weeks to readdress      his medications, cholesterol and hypertension.    ______________________________  [name] [name], MD, [facility]    PCN/MedQ  DD: 07/05/2006  DT: 07/05/2006  Job #: [id number]

## 2011-01-12 NOTE — Discharge Summary (Signed)
[name], [name]                         ACCOUNT NO.:  [id number]   MEDICAL RECORD NO.:  [id number]                   PATIENT TYPE:  INP   LOCATION:  2029                                 FACILITY:  [facility]   PHYSICIAN:  Salvatore Decent. Cornelius Moras, M.D.              DATE OF BIRTH:  02-11-1934   DATE OF ADMISSION:  06/16/2002  DATE OF DISCHARGE:  06/24/2002                                 DISCHARGE SUMMARY   CARDIOLOGIST:  Dr. Charlton Haws.   PRIMARY CARE-GIVER:  Dr. Al Decant. Foreman.   DISCHARGE DIAGNOSES:  1. Severe three-vessel atherosclerotic coronary artery disease.  2. Mild aortic stenosis with heavy calcification of leaflets and at least     one leaflet dysfunction.  3. Class III dyspnea.  4. Normal left ventricular function estimated at 62%.  5. Fluid overload postoperatively, with gentle diuresis to preoperative     weight by postoperative day #7.  6. Postoperative atrial fibrillation, with controlled ventricular rate on     Lopressor and amiodarone at discharge.  7. Right forearm thrombophlebitis postoperatively, resolved after six days     of intravenous Ancef.   SECONDARY DIAGNOSES:  1. Hypertension.  2. Hypercholesterolemia.   PROCEDURES:  June 16, 2002, coronary artery bypass graft surgery x5, Dr.  Purcell Nails.  In this procedure, the left internal mammary artery was  connected in an end-to-side fashion to the left anterior descending coronary  artery, a reversed saphenous vein graft was fashioned to the posterior  descending coronary artery, a reversed saphenous vein graft was also  fashioned to the first diagonal, a sequential reversed saphenous vein graft  was fashioned to the medial circumflex branch and then to the lateral  circumflex branch.  In addition, a 23-mm St. Jude aortic valve was placed.  The patient was taken off cardiopulmonary bypass without inotropic  intravenous medications.  No blood products were given.   DISCHARGE DISPOSITION:  The  patient was ready for discharge on postoperative  day #8, June 24, 2002.  He had been afebrile in the postoperative period.  His mental status had been clear and he was ambulating independently.  He  was off all supplemental oxygen by postoperative day #7, but did not at any  time have problems with desaturation on ambulation.  His postoperative  atrial fibrillation continued but controlled with Lopressor and amiodarone.  He was also started on anticoagulation on postoperative day #1; this was  still not therapeutic by the time of discharge, INR of 1.6 on postoperative  day #8, however, he was covered with Lovenox injections throughout the  postoperative period and would go home on Lovenox injections until the INR  reached a therapeutic level.   DISCHARGE MEDICATIONS:  The patient was discharged on the following  medications:  1. Ultram 50 mg one to two tabs p.o. q.4-6h. p.r.n. pain.  2. Pepcid 20 mg one p.o. b.i.d.  3. Reglan 10 mg  one p.o. b.i.d.  4. Zocor 40 mg one daily, preferably at bedtime.  5. Lopressor 50 mg one and one-half tabs twice daily.  6. Amiodarone 200 mg one p.o. b.i.d.  7. Coumadin 7.5 mg daily -- that is one and one-half 5 mg tablet -- or as     directed by physician.  8. Also, he will receive Lovenox subcutaneous injections -- 60 mg     subcutaneous every 12 hours.   DISCHARGE ACTIVITY:  His discharge activity will be to walk daily to build  up his strength.  He is asked not to engage in any strenuous lifting more  than 10 pounds, nor to drive for the next 6 weeks.   DISCHARGE DIET:  His discharge diet is a low-sodium, low-cholesterol diet.   WOUND CARE:  The patient may shower, keeping his incision clean and dry.  He  is to call the office of [facility] if  he notices any erythema, increased pain at the place of his chest sternotomy  incision or any drainage from his incision.   FOLLOWUP:  He will report to the Coumadin  Clinic at [facility],  October 31st, Friday, for INR studies and these will continue until his  PT/INR are stable.  He will see Dr. [name] for an appointment in two weeks -  - he is to call [phone] to arrange that appointment -- and he will see Dr.  Barry Dienes, Monday, November 17th, at 12:45 in the afternoon; he is to bring the  chest x-ray with him taken at his cardiologist's office.   BRIEF HISTORY:  The patient is a 77 year old gentleman who has been followed  by Dr. Janey Greaser with a history of hypertension, hypercholesterolemia and mild  aortic stenosis.  Over the last six to eight months, the patient has  developed severely progressive symptoms of exertional dyspnea; this has  limited his physical activity currently and he even experiences short  periods of dyspnea at rest.  He denies any associated symptoms of chest  discomfort.  He has one- to two-pillow orthopnea.  He denies any episodes of  paroxysmal nocturnal dyspnea or syncope.  He denies any history of  palpitations or cardiac dysrhythmias and has lower extremity edema.  In  September of this year, he underwent a transesophageal echocardiogram; this  study showed left ventricular function is normal with mild concentric left  ventricular hypertrophy.  There was mild aortic stenosis with significant  calcification of the aortic valve and notable restriction of motion of at  least one cusp of the aortic valve.  The patient subsequently underwent a  stress Cardiolite study on September 5th with ST segment depressions in the  inferior leads; he was then brought in for elective left and right heart  catheterization.  Extensive catheterization study showed an 80% mid-point  LAD stenosis, a 70-80% mid-circumflex stenosis, an 80% stenosis of the  second circumflex branch and a 90% proximal stenosis of the diagonal, a 70% proximal stenosis of the right coronary artery.  The patient was seen by Dr.  Cornelius Moras on October 13th.  The risks  and benefits of revascularization surgery  as well as placement of a prosthetic prosthesis for the aortic valve were  discussed; the patient agreed to accept responsibility and elected to have  the procedure; this was scheduled for October 21st, to consist of coronary  artery bypass graft surgery and an aortic valve replacement.   HOSPITAL COURSE:  Hospital course is as described in discharge  disposition.  The patient's postoperative course lasted eight days.  He did not have any  particularly prolonged respiratory decompensation.  He did have  postoperative atrial fibrillation with controlled ventricular rate and would  benefit from Coumadin therapy as well as Coumadin therapy already elected  for his aortic valve repair.  The patient goes home with the medications and  followup as dictated above.     Maple Mirza, P.A.                    Salvatore Decent. Cornelius Moras, M.D.    GM/MEDQ  D:  07/08/2002  T:  07/09/2002  Job:  [id number]

## 2011-01-12 NOTE — Procedures (Signed)
[facility]  Patient:    [name], [name]                      MRN: [id number] Proc. Date: 06/07/00 Adm. Date:  [id number] Disc. Date: [id number] Attending:  Deneen Harts CC:         Al Decant. Janey Greaser, M.D., [facility]   Procedure Report  PROCEDURE:  Colonoscopy.  INDICATIONS:  A 77 year old white male undergoing colorectal neoplasia surveillance with known personal history of polyps, last resected August 1995. The patient was to return in five years for which he is now approximately a year and a half overdue.  The patient denies any interim difficulties.  Denies change in bowel habits.  No new medical problems.  INFORMED CONSENT:  After reviewing the nature of the procedure with the patient including potential risks and complications and after discussing alternative methods of diagnosis and treatment, informed consent was signed.  PREMEDICATION:   The patient was premedicated receiving IV sedation totalling Versed 8 mg, fentanyl 100 mcg administered IV in divided doses during the course of the procedure.  Using the Olympus CF-140L video colonoscope, the rectum was intubated after normal digital examination.  The digital examination did not reveal any evidence of perianal or intrarectal pathology.  The prostate could barely be reached with the tip of the examining finger.  The scope was inserted and easily advanced around the entire length of the colon to the cecum, identified by the appendiceal orifice, ileocecal valve, transillumination of the right lower quadrant.  The scope was slowly withdrawn with careful inspection of the entire colon in a retrograde manner including retroflexed view in the rectal vault. Visualization was good throughout.  Copious irrigation helped clear up some of the regimen Viscol debris scattered throughout the colon.  FINDINGS:  Findings included two diminutive polyps in the right mid  ascending colon, one approximately 3 mm and the second adjacent polyp approximately 6 mm in diameter.  Both were resected using hot biopsy forceps, recovered and submitted to pathology.  A third polyp was identified at the junction of the descending colon and sigmoid at approximately 50 cm.  This was approximately 1 cm in diameter and was resected using electrocautery snare, recovered and submitted to pathology.  The only other finding was that of moderate sigmoid diverticulosis.  No evidence of acute inflammation.  The colon was decompressed and scope withdrawn.  The patient tolerated the procedure without difficulty being maintained on Datascope monitor and low-flow oxygen throughout.  Time:  2, technical 2, preparation 2, total score equals 6.  ASSESSMENT: 1. Multiple colon polyps--benign, adenomatous appearance.  All resected,    pathology pending. 2.  Diverticulosis--left colon, moderate.  RECOMMENDATION: 1. Postpolypectomy instructions reviewed. 2. Followup pathology. 3. Repeat colonoscopy in five years. 4. Annual Hemoccult. DD:  06/07/00 TD:  06/10/00 Job: [id number]

## 2011-01-25 ENCOUNTER — Ambulatory Visit (INDEPENDENT_AMBULATORY_CARE_PROVIDER_SITE_OTHER): Payer: Medicare Other | Admitting: *Deleted

## 2011-01-25 DIAGNOSIS — I4891 Unspecified atrial fibrillation: Secondary | ICD-10-CM

## 2011-01-25 DIAGNOSIS — Z954 Presence of other heart-valve replacement: Secondary | ICD-10-CM

## 2011-01-25 DIAGNOSIS — I359 Nonrheumatic aortic valve disorder, unspecified: Secondary | ICD-10-CM

## 2011-01-30 ENCOUNTER — Other Ambulatory Visit: Payer: Self-pay | Admitting: Cardiovascular Disease

## 2011-02-13 ENCOUNTER — Ambulatory Visit (INDEPENDENT_AMBULATORY_CARE_PROVIDER_SITE_OTHER): Payer: Medicare Other | Admitting: *Deleted

## 2011-02-13 DIAGNOSIS — I359 Nonrheumatic aortic valve disorder, unspecified: Secondary | ICD-10-CM

## 2011-02-13 DIAGNOSIS — Z954 Presence of other heart-valve replacement: Secondary | ICD-10-CM

## 2011-02-13 DIAGNOSIS — I4891 Unspecified atrial fibrillation: Secondary | ICD-10-CM

## 2011-02-13 LAB — POCT INR: INR: 2.5

## 2011-02-24 ENCOUNTER — Other Ambulatory Visit: Payer: Self-pay | Admitting: Cardiovascular Disease

## 2011-03-13 ENCOUNTER — Ambulatory Visit (INDEPENDENT_AMBULATORY_CARE_PROVIDER_SITE_OTHER): Payer: Medicare Other | Admitting: *Deleted

## 2011-03-13 DIAGNOSIS — I359 Nonrheumatic aortic valve disorder, unspecified: Secondary | ICD-10-CM

## 2011-03-13 DIAGNOSIS — I4891 Unspecified atrial fibrillation: Secondary | ICD-10-CM

## 2011-03-13 DIAGNOSIS — Z954 Presence of other heart-valve replacement: Secondary | ICD-10-CM

## 2011-03-17 ENCOUNTER — Other Ambulatory Visit: Payer: Self-pay | Admitting: Internal Medicine

## 2011-03-21 ENCOUNTER — Other Ambulatory Visit: Payer: Self-pay | Admitting: Interventional Radiology

## 2011-03-28 ENCOUNTER — Ambulatory Visit (INDEPENDENT_AMBULATORY_CARE_PROVIDER_SITE_OTHER): Payer: Medicare Other | Admitting: *Deleted

## 2011-03-28 DIAGNOSIS — I359 Nonrheumatic aortic valve disorder, unspecified: Secondary | ICD-10-CM

## 2011-03-28 DIAGNOSIS — I4891 Unspecified atrial fibrillation: Secondary | ICD-10-CM

## 2011-03-28 DIAGNOSIS — Z954 Presence of other heart-valve replacement: Secondary | ICD-10-CM

## 2011-04-12 ENCOUNTER — Encounter (HOSPITAL_COMMUNITY): Payer: Self-pay

## 2011-04-12 ENCOUNTER — Ambulatory Visit (HOSPITAL_COMMUNITY)
Admission: RE | Admit: 2011-04-12 | Discharge: 2011-04-12 | Disposition: A | Discharge status: Home / Self Care | Payer: Medicare Other | Source: Ambulatory Visit | Attending: Interventional Radiology | Admitting: Interventional Radiology

## 2011-04-12 DIAGNOSIS — K7689 Other specified diseases of liver: Secondary | ICD-10-CM | POA: Insufficient documentation

## 2011-04-12 DIAGNOSIS — Z85528 Personal history of other malignant neoplasm of kidney: Secondary | ICD-10-CM | POA: Insufficient documentation

## 2011-04-12 DIAGNOSIS — I700 Atherosclerosis of aorta: Secondary | ICD-10-CM | POA: Insufficient documentation

## 2011-04-12 DIAGNOSIS — N281 Cyst of kidney, acquired: Secondary | ICD-10-CM | POA: Insufficient documentation

## 2011-04-12 MED ORDER — IOHEXOL 300 MG/ML  SOLN
75.0000 mL | Freq: Once | INTRAMUSCULAR | Status: AC | PRN
  Administered 2011-04-12: 75 mL via INTRAVENOUS

## 2011-04-18 ENCOUNTER — Ambulatory Visit
Admission: RE | Admit: 2011-04-18 | Discharge: 2011-04-18 | Disposition: A | Discharge status: Home / Self Care | Payer: Medicare Other | Source: Ambulatory Visit | Attending: Interventional Radiology | Admitting: Interventional Radiology

## 2011-04-18 NOTE — Progress Notes (Signed)
Pt w/o complaint.  jkl

## 2011-04-25 ENCOUNTER — Ambulatory Visit (INDEPENDENT_AMBULATORY_CARE_PROVIDER_SITE_OTHER): Payer: Medicare Other | Admitting: *Deleted

## 2011-04-25 DIAGNOSIS — I4891 Unspecified atrial fibrillation: Secondary | ICD-10-CM

## 2011-04-25 DIAGNOSIS — Z954 Presence of other heart-valve replacement: Secondary | ICD-10-CM

## 2011-04-25 DIAGNOSIS — I359 Nonrheumatic aortic valve disorder, unspecified: Secondary | ICD-10-CM

## 2011-05-04 ENCOUNTER — Other Ambulatory Visit: Payer: Self-pay | Admitting: Interventional Radiology

## 2011-05-23 ENCOUNTER — Ambulatory Visit (INDEPENDENT_AMBULATORY_CARE_PROVIDER_SITE_OTHER): Payer: Medicare Other | Admitting: *Deleted

## 2011-05-23 DIAGNOSIS — I4891 Unspecified atrial fibrillation: Secondary | ICD-10-CM

## 2011-05-23 DIAGNOSIS — I359 Nonrheumatic aortic valve disorder, unspecified: Secondary | ICD-10-CM

## 2011-05-23 DIAGNOSIS — Z954 Presence of other heart-valve replacement: Secondary | ICD-10-CM

## 2011-05-23 LAB — POCT INR: INR: 2.2

## 2011-05-31 LAB — COMPREHENSIVE METABOLIC PANEL
ALT: 20 U/L (ref 0–53)
Albumin: 3.4 g/dL — ABNORMAL LOW (ref 3.5–5.2)
Albumin: 3.8 g/dL (ref 3.5–5.2)
Alkaline Phosphatase: 88 U/L (ref 39–117)
BUN: 14 mg/dL (ref 6–23)
BUN: 19 mg/dL (ref 6–23)
Chloride: 102 mEq/L (ref 96–112)
Chloride: 106 mEq/L (ref 96–112)
Creatinine, Ser: 0.97 mg/dL (ref 0.4–1.5)
GFR calc non Af Amer: >60 mL/min (ref >60)
Glucose, Bld: 118 mg/dL — ABNORMAL HIGH (ref 70–99)
Potassium: 3.7 mEq/L (ref 3.5–5.1)
Sodium: 140 mEq/L (ref 135–145)
Total Bilirubin: 0.5 mg/dL (ref 0.3–1.2)
Total Bilirubin: 0.8 mg/dL (ref 0.3–1.2)

## 2011-05-31 LAB — CBC
HCT: 40.5 % (ref 39.0–52.0)
HCT: 40.6 % (ref 39.0–52.0)
HCT: 42.6 % (ref 39.0–52.0)
Hemoglobin: 13 g/dL (ref 13.0–17.0)
Hemoglobin: 13.4 g/dL (ref 13.0–17.0)
Hemoglobin: 13.6 g/dL (ref 13.0–17.0)
Platelets: 257 10*3/uL (ref 150–400)
Platelets: 261 10*3/uL (ref 150–400)
RBC: 4.41 MIL/uL (ref 4.22–5.81)
RBC: 4.61 MIL/uL (ref 4.22–5.81)
RDW: 12.6 % (ref 11.5–15.5)
RDW: 12.9 % (ref 11.5–15.5)
WBC: 9 10*3/uL (ref 4.0–10.5)
WBC: 9.1 10*3/uL (ref 4.0–10.5)
WBC: 9.6 10*3/uL (ref 4.0–10.5)

## 2011-05-31 LAB — DIFFERENTIAL
Basophils Absolute: 0 10*3/uL (ref 0.0–0.1)
Lymphocytes Relative: 27 % (ref 12–46)
Monocytes Absolute: 0.6 10*3/uL (ref 0.1–1.0)
Neutro Abs: 5.1 10*3/uL (ref 1.7–7.7)
Neutrophils Relative %: 63 % (ref 43–77)

## 2011-05-31 LAB — B-NATRIURETIC PEPTIDE (CONVERTED LAB): Pro B Natriuretic peptide (BNP): 69 pg/mL (ref 0.0–100.0)

## 2011-05-31 LAB — CARDIAC PANEL(CRET KIN+CKTOT+MB+TROPI)
CK, MB: 1.2 ng/mL (ref 0.3–4.0)
CK, MB: 2.2 ng/mL (ref 0.3–4.0)
Relative Index: 0.9 (ref 0.0–2.5)
Relative Index: INVALID (ref 0.0–2.5)
Total CK: 60 U/L (ref 7–232)
Troponin I: 0.01 ng/mL (ref 0.00–0.06)

## 2011-05-31 LAB — URINALYSIS, ROUTINE W REFLEX MICROSCOPIC
Glucose, UA: NEGATIVE mg/dL
Hgb urine dipstick: NEGATIVE
Specific Gravity, Urine: 1.031 — ABNORMAL HIGH (ref 1.005–1.030)

## 2011-05-31 LAB — BASIC METABOLIC PANEL
CO2: 30 mEq/L (ref 19–32)
Calcium: 9.4 mg/dL (ref 8.4–10.5)
Chloride: 98 mEq/L (ref 96–112)
GFR calc Af Amer: >60 mL/min (ref >60)
GFR calc Af Amer: >60 mL/min (ref >60)
GFR calc non Af Amer: >60 mL/min (ref >60)
Glucose, Bld: 131 mg/dL — ABNORMAL HIGH (ref 70–99)
Potassium: 3.7 mEq/L (ref 3.5–5.1)
Potassium: 3.7 mEq/L (ref 3.5–5.1)
Sodium: 135 mEq/L (ref 135–145)
Sodium: 136 mEq/L (ref 135–145)

## 2011-05-31 LAB — PROTIME-INR
INR: 1.8 — ABNORMAL HIGH (ref 0.00–1.49)
INR: 2.3 — ABNORMAL HIGH (ref 0.00–1.49)
INR: 2.3 — ABNORMAL HIGH (ref 0.00–1.49)
INR: 2.4 — ABNORMAL HIGH (ref 0.00–1.49)
Prothrombin Time: 21.6 seconds — ABNORMAL HIGH (ref 11.6–15.2)
Prothrombin Time: 26.4 seconds — ABNORMAL HIGH (ref 11.6–15.2)

## 2011-05-31 LAB — POCT CARDIAC MARKERS
CKMB, poc: 1.4 ng/mL (ref 1.0–8.0)
Troponin i, poc: <0.05 ng/mL (ref 0.00–0.09)

## 2011-05-31 LAB — HEMOGLOBIN A1C
Hgb A1c MFr Bld: 5.8 % (ref 4.6–6.1)
Mean Plasma Glucose: 120 mg/dL

## 2011-05-31 LAB — APTT: aPTT: 39 seconds — ABNORMAL HIGH (ref 24–37)

## 2011-05-31 LAB — CK TOTAL AND CKMB (NOT AT ARMC): CK, MB: 1.4 ng/mL (ref 0.3–4.0)

## 2011-05-31 LAB — TSH: TSH: 1.573 u[IU]/mL (ref 0.350–4.500)

## 2011-05-31 LAB — TROPONIN I: Troponin I: 0.01 ng/mL (ref 0.00–0.06)

## 2011-05-31 LAB — HEPARIN LEVEL (UNFRACTIONATED)
Heparin Unfractionated: 0.27 IU/mL — ABNORMAL LOW (ref 0.30–0.70)
Heparin Unfractionated: <0.1 IU/mL — ABNORMAL LOW (ref 0.30–0.70)

## 2011-06-08 LAB — PROTIME-INR: INR: 1.8 — ABNORMAL HIGH

## 2011-06-08 LAB — CBC
MCHC: 34.1
MCV: 87.6
RBC: 3.9 — ABNORMAL LOW
RDW: 13.2

## 2011-06-08 LAB — BASIC METABOLIC PANEL
BUN: 5 — ABNORMAL LOW
BUN: 7
CO2: 27
Chloride: 104
Chloride: 99
Creatinine, Ser: 0.89
GFR calc Af Amer: >60
Glucose, Bld: 122 — ABNORMAL HIGH
Glucose, Bld: 124 — ABNORMAL HIGH
Potassium: 3.7

## 2011-06-08 LAB — DIFFERENTIAL
Basophils Absolute: 0
Basophils Relative: 0
Eosinophils Absolute: 0.2
Monocytes Relative: 9
Neutrophils Relative %: 76

## 2011-06-08 LAB — POCT CARDIAC MARKERS
CKMB, poc: 1
Myoglobin, poc: 192
Operator id: 270651
Troponin i, poc: <0.05

## 2011-06-11 LAB — DIFFERENTIAL
Basophils Absolute: 0
Basophils Relative: 0
Eosinophils Relative: 0
Lymphocytes Relative: 4 — ABNORMAL LOW
Monocytes Absolute: 1 — ABNORMAL HIGH

## 2011-06-11 LAB — I-STAT 8, (EC8 V) (CONVERTED LAB)
Acid-base deficit: 2
Bicarbonate: 22.3
HCT: 38 — ABNORMAL LOW
Hemoglobin: 12.9 — ABNORMAL LOW
Operator id: 270651
Sodium: 128 — ABNORMAL LOW
TCO2: 23

## 2011-06-11 LAB — URINE CULTURE: Colony Count: NO GROWTH

## 2011-06-11 LAB — PROTIME-INR: Prothrombin Time: 22.6 — ABNORMAL HIGH

## 2011-06-11 LAB — CBC
HCT: 36.6 — ABNORMAL LOW
Hemoglobin: 12.6 — ABNORMAL LOW
MCHC: 34.3
RDW: 12.8

## 2011-06-11 LAB — URINE MICROSCOPIC-ADD ON

## 2011-06-11 LAB — POCT CARDIAC MARKERS
CKMB, poc: <1 — ABNORMAL LOW
Myoglobin, poc: 244

## 2011-06-11 LAB — URINALYSIS, ROUTINE W REFLEX MICROSCOPIC
Bilirubin Urine: NEGATIVE
Ketones, ur: 40 — AB
Nitrite: NEGATIVE
pH: 6

## 2011-06-11 LAB — APTT: aPTT: 29

## 2011-06-18 ENCOUNTER — Other Ambulatory Visit: Payer: Self-pay | Admitting: Cardiovascular Disease

## 2011-06-19 ENCOUNTER — Ambulatory Visit: Payer: Medicare Other | Admitting: Cardiovascular Disease

## 2011-06-20 ENCOUNTER — Encounter: Payer: Self-pay | Admitting: Cardiovascular Disease

## 2011-06-20 ENCOUNTER — Ambulatory Visit (INDEPENDENT_AMBULATORY_CARE_PROVIDER_SITE_OTHER): Payer: Medicare Other | Admitting: *Deleted

## 2011-06-20 ENCOUNTER — Ambulatory Visit (INDEPENDENT_AMBULATORY_CARE_PROVIDER_SITE_OTHER): Payer: Medicare Other | Admitting: Cardiovascular Disease

## 2011-06-20 DIAGNOSIS — I359 Nonrheumatic aortic valve disorder, unspecified: Secondary | ICD-10-CM

## 2011-06-20 DIAGNOSIS — Z954 Presence of other heart-valve replacement: Secondary | ICD-10-CM

## 2011-06-20 DIAGNOSIS — I4891 Unspecified atrial fibrillation: Secondary | ICD-10-CM

## 2011-06-20 DIAGNOSIS — I2510 Atherosclerotic heart disease of native coronary artery without angina pectoris: Secondary | ICD-10-CM

## 2011-06-20 DIAGNOSIS — E785 Hyperlipidemia, unspecified: Secondary | ICD-10-CM

## 2011-06-20 DIAGNOSIS — Z7901 Long term (current) use of anticoagulants: Secondary | ICD-10-CM

## 2011-06-20 DIAGNOSIS — I10 Essential (primary) hypertension: Secondary | ICD-10-CM

## 2011-06-20 LAB — POCT INR: INR: 2.2

## 2011-06-20 NOTE — Assessment & Plan Note (Signed)
Cholesterol is at goal.  Continue current dose of statin and diet Rx.  No myalgias or side effects.  F/U  LFT's in 6 months. No results found for this basename: LDLCALC             

## 2011-06-20 NOTE — Assessment & Plan Note (Signed)
Stable with no angina and good activity level.  Continue medical Rx  

## 2011-06-20 NOTE — Patient Instructions (Signed)
Your physician wants you to follow-up in: 6 MONTHS You will receive a reminder letter in the mail two months in advance. If you don't receive a letter, please call our office to schedule the follow-up appointment. 

## 2011-06-20 NOTE — Assessment & Plan Note (Signed)
Good rate control and anticoagulation.  Declines amiodarone and ablation

## 2011-06-20 NOTE — Assessment & Plan Note (Signed)
Did not take meds this am.  Not been taking Lasix.  Encouraged him to be compliant.  Lasix at least daily

## 2011-06-20 NOTE — Progress Notes (Signed)
Mr. Riese returns today for followup of atrial fibrillation. The patient underwent CABG/AVR many years ago. He has had increasingly frequent episodes of atrial fib and is now persistent. We had him admitted for Tikosyn loading and he went back to NSR but then back to atrial fib. His QT was long and we ultimately decided to stop his Tikosyn and increase his carvedilol. He is improved. He has not had frank syncope. He does note some peripheral edema. His wife who is with him today states that dyspnea is better, now class 2. Will increase coreg for better rate control. Saw Dr Johney Frame in April and did not want to pursue amiodarone or ablation  ROS: Denies fever, malais, weight loss, blurry vision, decreased visual acuity, cough, sputum, SOB, hemoptysis, pleuritic pain, palpitaitons, heartburn, abdominal pain, melena, lower extremity edema, claudication, or rash.  All other systems reviewed and negative  General: Affect appropriate Healthy:  appears stated age HEENT: normal Neck supple with no adenopathy JVP normal no bruits no thyromegaly Lungs clear with no wheezing and good diaphragmatic motion Heart:  S1/S2 click SEM  No AR  murmur,rub, gallop or click PMI normal Abdomen: benighn, BS positve, no tenderness, no AAA no bruit.  No HSM or HJR Distal pulses intact with no bruits Plus one bilateral edema Neuro non-focal Skin warm and dry No muscular weakness   Current Outpatient Prescriptions  Medication Sig Dispense Refill  . acetaminophen (TYLENOL) 325 MG tablet 2 tabs po bid       . amoxicillin (AMOXIL) 500 MG capsule Take 500 mg by mouth as needed. Dental       . atorvastatin (LIPITOR) 40 MG tablet TAKE 1 TABLET BY MOUTH DAILY  90 tablet  4  . carvedilol (COREG) 25 MG tablet Take 1 tablet (25 mg total) by mouth 2 (two) times daily with a meal.  60 tablet  12  . digoxin (LANOXIN) 0.125 MG tablet TAKE ONE TABLET BY MOUTH DAILY  90 tablet  3  . fish oil-omega-3 fatty acids 1000 MG capsule 2  tabs po qd       . metoCLOPramide (REGLAN) 10 MG tablet Take 10 mg by mouth 4 (four) times daily.        . Multiple Vitamin (MULTIVITAMIN) tablet Take 1 tablet by mouth daily.        . nitroGLYCERIN (NITROSTAT) 0.4 MG SL tablet Place 0.4 mg under the tongue every 5 (five) minutes as needed.        . potassium chloride (KLOR-CON) 8 MEQ CR tablet Take 8 mEq by mouth daily.        . ramipril (ALTACE) 10 MG capsule Take 1 tablet by mouth once a day  30 capsule  6  . Tamsulosin HCl (FLOMAX) 0.4 MG CAPS Take by mouth.        . vitamin B-12 (CYANOCOBALAMIN) 1000 MCG tablet Take 1,000 mcg by mouth daily.        [name] warfarin (COUMADIN) 5 MG tablet USE AS DIRECTED BY ANTICOAGULATION CLINIC  180 tablet  1    Allergies  Review of patient's allergies indicates no known allergies.  Electrocardiogram:  Assessment and Plan

## 2011-06-20 NOTE — Assessment & Plan Note (Signed)
Normal exam  Echo in a year SBE

## 2011-07-24 ENCOUNTER — Ambulatory Visit (INDEPENDENT_AMBULATORY_CARE_PROVIDER_SITE_OTHER): Payer: Medicare Other | Admitting: *Deleted

## 2011-07-24 DIAGNOSIS — Z7901 Long term (current) use of anticoagulants: Secondary | ICD-10-CM

## 2011-07-24 DIAGNOSIS — I359 Nonrheumatic aortic valve disorder, unspecified: Secondary | ICD-10-CM

## 2011-07-24 DIAGNOSIS — Z954 Presence of other heart-valve replacement: Secondary | ICD-10-CM

## 2011-07-24 DIAGNOSIS — I4891 Unspecified atrial fibrillation: Secondary | ICD-10-CM

## 2011-07-24 LAB — POCT INR: INR: 2.5

## 2011-09-04 ENCOUNTER — Ambulatory Visit (INDEPENDENT_AMBULATORY_CARE_PROVIDER_SITE_OTHER): Payer: Medicare Other | Admitting: *Deleted

## 2011-09-04 DIAGNOSIS — I4891 Unspecified atrial fibrillation: Secondary | ICD-10-CM

## 2011-09-04 DIAGNOSIS — I359 Nonrheumatic aortic valve disorder, unspecified: Secondary | ICD-10-CM

## 2011-09-04 DIAGNOSIS — Z954 Presence of other heart-valve replacement: Secondary | ICD-10-CM

## 2011-09-04 DIAGNOSIS — Z7901 Long term (current) use of anticoagulants: Secondary | ICD-10-CM

## 2011-09-04 LAB — POCT INR: INR: 3

## 2011-10-16 ENCOUNTER — Ambulatory Visit (INDEPENDENT_AMBULATORY_CARE_PROVIDER_SITE_OTHER): Payer: Medicare Other | Admitting: *Deleted

## 2011-10-16 DIAGNOSIS — I4891 Unspecified atrial fibrillation: Secondary | ICD-10-CM

## 2011-10-16 DIAGNOSIS — Z7901 Long term (current) use of anticoagulants: Secondary | ICD-10-CM

## 2011-10-16 DIAGNOSIS — Z954 Presence of other heart-valve replacement: Secondary | ICD-10-CM

## 2011-10-16 DIAGNOSIS — I359 Nonrheumatic aortic valve disorder, unspecified: Secondary | ICD-10-CM

## 2011-11-27 ENCOUNTER — Ambulatory Visit (INDEPENDENT_AMBULATORY_CARE_PROVIDER_SITE_OTHER): Payer: Medicare Other

## 2011-11-27 DIAGNOSIS — Z954 Presence of other heart-valve replacement: Secondary | ICD-10-CM

## 2011-11-27 DIAGNOSIS — I4891 Unspecified atrial fibrillation: Secondary | ICD-10-CM

## 2011-11-27 DIAGNOSIS — Z7901 Long term (current) use of anticoagulants: Secondary | ICD-10-CM

## 2011-11-27 DIAGNOSIS — I359 Nonrheumatic aortic valve disorder, unspecified: Secondary | ICD-10-CM

## 2011-11-27 LAB — POCT INR: INR: 3.1

## 2011-12-14 ENCOUNTER — Other Ambulatory Visit: Payer: Self-pay

## 2011-12-14 ENCOUNTER — Other Ambulatory Visit: Payer: Self-pay | Admitting: Cardiovascular Disease

## 2011-12-14 MED ORDER — CARVEDILOL 25 MG PO TABS: 25.0000 mg | ORAL_TABLET | Freq: Two times a day (BID) | ORAL | Status: DC

## 2011-12-17 ENCOUNTER — Encounter: Payer: Self-pay | Admitting: Cardiovascular Disease

## 2011-12-17 ENCOUNTER — Ambulatory Visit (INDEPENDENT_AMBULATORY_CARE_PROVIDER_SITE_OTHER): Payer: Medicare Other | Admitting: Cardiovascular Disease

## 2011-12-17 VITALS — BP 132/80 | HR 82 | Ht 74.0 in | Wt 276.0 lb

## 2011-12-17 DIAGNOSIS — Z7901 Long term (current) use of anticoagulants: Secondary | ICD-10-CM

## 2011-12-17 DIAGNOSIS — Z954 Presence of other heart-valve replacement: Secondary | ICD-10-CM

## 2011-12-17 DIAGNOSIS — I10 Essential (primary) hypertension: Secondary | ICD-10-CM

## 2011-12-17 DIAGNOSIS — I4891 Unspecified atrial fibrillation: Secondary | ICD-10-CM

## 2011-12-17 DIAGNOSIS — E785 Hyperlipidemia, unspecified: Secondary | ICD-10-CM

## 2011-12-17 NOTE — Assessment & Plan Note (Signed)
Chronic Good rate control and anticoagulation.  Failed DCC, Tikosyn, and not interested in ablation

## 2011-12-17 NOTE — Assessment & Plan Note (Signed)
Cholesterol is at goal.  Continue current dose of statin and diet Rx.  No myalgias or side effects.  F/U  LFT's in 6 months. No results found for this basename: LDLCALC             

## 2011-12-17 NOTE — Assessment & Plan Note (Signed)
Well controlled.  Continue current medications and low sodium Dash type diet.    

## 2011-12-17 NOTE — Assessment & Plan Note (Signed)
INR;s RX  No bleeding F/U clinic q 4-6 weeks

## 2011-12-17 NOTE — Patient Instructions (Signed)
Your physician wants you to follow-up in: 6 months with Dr. Nishan. You will receive a reminder letter in the mail two months in advance. If you don't receive a letter, please call our office to schedule the follow-up appointment.  

## 2011-12-17 NOTE — Progress Notes (Signed)
Mr. [name] returns today for followup of atrial fibrillation. The patient underwent CABG/AVR many years ago. He has had increasingly frequent episodes of atrial fib and is now persistent. We had him admitted for Tikosyn loading and he went back to NSR but then back to atrial fib. His QT was long and we ultimately decided to stop his Tikosyn and increase his carvedilol. He is improved. He has not had frank syncope. He does note some peripheral edema. His wife who is with him today states that dyspnea is better, now class 2. Will increase coreg for better rate control. Saw Dr Johney Frame in April and did not want to pursue amiodarone or ablation  ROS: Denies fever, malais, weight loss, blurry vision, decreased visual acuity, cough, sputum, SOB, hemoptysis, pleuritic pain, palpitaitons, heartburn, abdominal pain, melena, lower extremity edema, claudication, or rash.  All other systems reviewed and negative  General: Affect appropriate Overweight white male HEENT: normal Neck supple with no adenopathy JVP normal no bruits no thyromegaly Lungs clear with no wheezing and good diaphragmatic motion Heart:  S1/S2 click SEM  no rub, gallop or click PMI normal Abdomen: benighn, BS positve, no tenderness, no AAA no bruit.  No HSM or HJR Distal pulses intact with no bruits Trace bilateral  edema Neuro non-focal Skin warm and dry No muscular weakness   Current Outpatient Prescriptions  Medication Sig Dispense Refill  . acetaminophen (TYLENOL) 325 MG tablet 2 tabs po bid       . amoxicillin (AMOXIL) 500 MG capsule Take 500 mg by mouth as needed. Dental       . atorvastatin (LIPITOR) 40 MG tablet TAKE 1 TABLET BY MOUTH DAILY  90 tablet  4  . carvedilol (COREG) 25 MG tablet Take 1 tablet (25 mg total) by mouth 2 (two) times daily with a meal.  60 tablet  0  . digoxin (LANOXIN) 0.125 MG tablet TAKE ONE TABLET BY MOUTH DAILY  90 tablet  3  . fish oil-omega-3 fatty acids 1000 MG capsule 2 tabs po qd       .  Multiple Vitamin (MULTIVITAMIN) tablet Take 1 tablet by mouth daily.        . nitroGLYCERIN (NITROSTAT) 0.4 MG SL tablet Place 0.4 mg under the tongue every 5 (five) minutes as needed.        . potassium chloride (KLOR-CON) 8 MEQ CR tablet Take 8 mEq by mouth daily.        . ramipril (ALTACE) 10 MG capsule Take 1 tablet by mouth once a day  30 capsule  6  . Tamsulosin HCl (FLOMAX) 0.4 MG CAPS Take 0.4 mg by mouth daily.       . vitamin B-12 (CYANOCOBALAMIN) 1000 MCG tablet Take 1,000 mcg by mouth daily.        [name] warfarin (COUMADIN) 5 MG tablet USE AS DIRECTED BY ANTICOAGULATION CLINIC  180 tablet  1    Allergies  Review of patient's allergies indicates no known allergies.  Electrocardiogram:  Afib rate 66 RSR'  Nonspecific inferior T wave changes  Assessment and Plan

## 2011-12-17 NOTE — Assessment & Plan Note (Signed)
Normal function.  No fevers.  SBE prophylaxis.  No AR on exam

## 2012-01-08 ENCOUNTER — Ambulatory Visit (INDEPENDENT_AMBULATORY_CARE_PROVIDER_SITE_OTHER): Payer: Medicare Other

## 2012-01-08 DIAGNOSIS — I359 Nonrheumatic aortic valve disorder, unspecified: Secondary | ICD-10-CM

## 2012-01-08 DIAGNOSIS — Z7901 Long term (current) use of anticoagulants: Secondary | ICD-10-CM

## 2012-01-08 DIAGNOSIS — I4891 Unspecified atrial fibrillation: Secondary | ICD-10-CM

## 2012-01-08 DIAGNOSIS — Z954 Presence of other heart-valve replacement: Secondary | ICD-10-CM

## 2012-02-18 ENCOUNTER — Ambulatory Visit (INDEPENDENT_AMBULATORY_CARE_PROVIDER_SITE_OTHER): Payer: Medicare Other | Admitting: Physician Assistant

## 2012-02-18 ENCOUNTER — Telehealth: Payer: Self-pay | Admitting: Cardiovascular Disease

## 2012-02-18 ENCOUNTER — Ambulatory Visit (INDEPENDENT_AMBULATORY_CARE_PROVIDER_SITE_OTHER): Payer: Medicare Other | Admitting: Pharmacist

## 2012-02-18 ENCOUNTER — Encounter: Payer: Self-pay | Admitting: Physician Assistant

## 2012-02-18 VITALS — BP 140/98 | Ht 74.0 in | Wt 277.0 lb

## 2012-02-18 DIAGNOSIS — I4891 Unspecified atrial fibrillation: Secondary | ICD-10-CM

## 2012-02-18 DIAGNOSIS — I2510 Atherosclerotic heart disease of native coronary artery without angina pectoris: Secondary | ICD-10-CM

## 2012-02-18 DIAGNOSIS — Z7901 Long term (current) use of anticoagulants: Secondary | ICD-10-CM

## 2012-02-18 DIAGNOSIS — I359 Nonrheumatic aortic valve disorder, unspecified: Secondary | ICD-10-CM

## 2012-02-18 DIAGNOSIS — I10 Essential (primary) hypertension: Secondary | ICD-10-CM

## 2012-02-18 DIAGNOSIS — Z952 Presence of prosthetic heart valve: Secondary | ICD-10-CM

## 2012-02-18 DIAGNOSIS — Z954 Presence of other heart-valve replacement: Secondary | ICD-10-CM

## 2012-02-18 MED ORDER — AMLODIPINE BESYLATE 5 MG PO TABS: 5.0000 mg | ORAL_TABLET | Freq: Every day | ORAL | Status: DC

## 2012-02-18 NOTE — Progress Notes (Signed)
[address] [place], [place]  [address] Phone: [phone] Fax:  [phone]  Date:  02/18/2012   Name:  [name]   DOB:  Nov 22, 1933   MRN:  [id number]  PCP:  Mickie Hillier, MD  Primary Cardiologist:  Dr. Charlton Haws   Primary Electrophysiologist:  Dr. Hillis Range    History of Present Illness: [name] is a 78 y.o. male who returns for evaluation of  BP.  He has a history of CAD, Aortic stenosis, status post CABG and St. Jude aortic valve replacement 05/2002, permanent atrial fibrillation, hypertension, hyperlipidemia.  Last nuclear study 04/2010: Low-risk study with mild fixed basal to mid inferior perfusion defect defect representing diaphragmatic attenuation versus prior infarct, no ischemia.  Last echo 04/2010: Mild LVH, EF 55%, diastolic dysfunction indeterminate, mechanical aortic valve prosthesis, no significant AI, mean gradient 18, borderline dilated aortic root, moderate LAE, mild RAE, PASP 30.  Last seen by Dr. [name] 11/2011.  He has noted increasing blood pressure over the last several weeks.  He was at his urologist office recently and had a blood pressure 190/100.  He has been checking them at home and getting similar results.  He typically takes his blood pressure before he takes his blood pressure medications.  He denies headaches.  He denies chest pain.  He denies significant changes in shortness of breath.  He describes chronic class IIb symptoms.  He denies orthopnea or PND.  He denies syncope.  He has chronic lower extremity edema without much change.  He denies over-the-counter medication use or increased caffeine use or high salt diet.  Wt Readings from Last 3 Encounters:  02/18/12 277 lb (125.646 kg)  12/17/11 276 lb (125.193 kg)  06/20/11 281 lb (127.461 kg)     Potassium  Date/Time Value Range Status  06/18/2010  5:15 AM 4.0  3.5 - 5.1 mEq/L Final     Creatinine, Ser  Date/Time Value Range Status  06/18/2010  5:15  AM 1.14  0.4 - 1.5 mg/dL Final     ALT  Date/Time Value Range Status  01/02/2009  8:50 PM 22  0 - 53 U/L Final     TSH  Date/Time Value Range Status  08/23/2008  4:45 PM 1.573 0.350 - 4.500 uIU/mL Final     Hemoglobin  Date/Time Value Range Status  06/18/2010  5:15 AM 13.1  13.0 - 17.0 g/dL Final    Past Medical History  Diagnosis Date  . Altered mental status   . Hypokalemia   . Hyperlipidemia   . Atrial fibrillation     longterm persistent  . CAD (coronary artery disease)   . HTN (hypertension)     Current Outpatient Prescriptions  Medication Sig Dispense Refill  . acetaminophen (TYLENOL) 325 MG tablet 2 tabs po bid       . amoxicillin (AMOXIL) 500 MG capsule Take 500 mg by mouth as needed. Dental       . atorvastatin (LIPITOR) 40 MG tablet TAKE 1 TABLET BY MOUTH DAILY  90 tablet  4  . carvedilol (COREG) 25 MG tablet Take 1 tablet (25 mg total) by mouth 2 (two) times daily with a meal.  60 tablet  0  . digoxin (LANOXIN) 0.125 MG tablet TAKE ONE TABLET BY MOUTH DAILY  90 tablet  3  . fish oil-omega-3 fatty acids 1000 MG capsule 2 tabs po qd       . Multiple Vitamin (MULTIVITAMIN) tablet Take 1 tablet by mouth daily.        [name]  nitroGLYCERIN (NITROSTAT) 0.4 MG SL tablet Place 0.4 mg under the tongue every 5 (five) minutes as needed.        . potassium chloride (KLOR-CON) 8 MEQ CR tablet Take 8 mEq by mouth daily.        . ramipril (ALTACE) 10 MG capsule Take 1 tablet by mouth once a day  30 capsule  6  . ranitidine (ZANTAC) 150 MG tablet Take 150 mg by mouth 2 (two) times daily.      . solifenacin (VESICARE) 10 MG tablet Take 10 mg by mouth daily.      . Tamsulosin HCl (FLOMAX) 0.4 MG CAPS Take 0.4 mg by mouth daily.       [name] torsemide (DEMADEX) 10 MG tablet Take 10 mg by mouth 2 (two) times daily.      . vitamin B-12 (CYANOCOBALAMIN) 1000 MCG tablet Take 1,000 mcg by mouth daily.        [name] warfarin (COUMADIN) 5 MG tablet USE AS DIRECTED BY ANTICOAGULATION CLINIC  180 tablet   1  . amLODipine (NORVASC) 5 MG tablet Take 1 tablet (5 mg total) by mouth daily.  30 tablet  11    Allergies: No Known Allergies  History  Substance Use Topics  . Smoking status: Former Games developer  . Smokeless tobacco: Not on file  . Alcohol Use: No     ROS:  Please see the history of present illness.   He does have a history of snoring.  His wife has never witnessed apneic episodes.  He denies any significant daytime hypersomnolence.  He has urinary incontinence and has recently been prescribed VESIcare.  He notes occasional spinning and sensation that is consistent with vertigo.  All other systems reviewed and negative.   PHYSICAL EXAM: VS:  BP 140/98  Ht 6\' 2"  (1.88 m)  Wt 277 lb (125.646 kg)  BMI 35.56 kg/m2 Repeat blood pressure by me on the right 142/90 on the left 150/90  Well nourished, well developed, in no acute distress HEENT: normal Neck: no JVD Vascular: No carotid bruits, no supraclavicular bruits, no abdominal bruits Cardiac:  normal S1, Mechanical S2; RRR; 1/6 systolic murmur along left sternal border Lungs:  clear to auscultation bilaterally, no wheezing, rhonchi or rales Abd: soft, nontender, no hepatomegaly Ext: Trace bilateral edema Skin: warm and dry Neuro:  CNs 2-12 intact, no focal abnormalities noted  EKG:  Atrial fibrillation, rate 58, normal axis, nonspecific ST-T wave changes, no significant change compared to prior tracings   ASSESSMENT AND PLAN:  1.  Hypertension Add amlodipine 5 mg daily. Check a basic metabolic panel and TSH today. He does have some symptoms that are consistent with sleep apnea.  His wife will continue to monitor his symptoms.  We may need to consider sleep testing in the future. He can followup in one month with either Dr. [name] or me.  2.  Atrial fibrillation Rate controlled.  He remains on Coumadin.  3.  Status post mechanical aortic valve replacement Coumadin managed in our Coumadin clinic. Last echo less than 2 years  ago with stable mechanical aortic valve replacement.  4.  Coronary artery disease No anginal symptoms. Continue statin.  He is not on aspirin due to chronic Coumadin.   Luna Glasgow, PA-C  3:48 PM 02/18/2012

## 2012-02-18 NOTE — Telephone Encounter (Signed)
Please return call to wife [phone]  Patient BP running  175/82. 192/76, 198/100, 198/92, 193/107 over the weekend.  Patient would like to be seen today.

## 2012-02-18 NOTE — Patient Instructions (Addendum)
Your physician recommends that you schedule a follow-up appointment in: 1 MONTHS WITH DR. [name] OR SCOTT WEAVER, PAC SAME DAY DR. [name] IS IN THE OFFICE  Your physician has recommended you make the following change in your medication: START NORVASC 5 MG 1 TABLET DAILY  Your physician recommends that you return for lab work in: TODAY BMET, TSH 401.1

## 2012-02-18 NOTE — Telephone Encounter (Signed)
SPOKE WITH WIFE, PT'S B/P ELEVATED  THIS WEEKEND  ONLY COMPLAINT  IS DIZZINESS IN AM AND WHEN CHANGING POSITIONS  WANTS AN APPT TODAY. APPT MADE WITH SCOTT WEAVER PAC  FOR 2:40 PM TODAY ./CY

## 2012-02-19 ENCOUNTER — Other Ambulatory Visit: Payer: Self-pay | Admitting: Cardiovascular Disease

## 2012-02-19 LAB — BASIC METABOLIC PANEL
BUN: 15 mg/dL (ref 6–23)
CO2: 29 mEq/L (ref 19–32)
Calcium: 9.9 mg/dL (ref 8.4–10.5)
Creatinine, Ser: 1 mg/dL (ref 0.4–1.5)
Glucose, Bld: 92 mg/dL (ref 70–99)

## 2012-03-27 ENCOUNTER — Encounter: Payer: Self-pay | Admitting: Cardiovascular Disease

## 2012-03-27 ENCOUNTER — Other Ambulatory Visit: Payer: Self-pay | Admitting: Interventional Radiology

## 2012-03-27 ENCOUNTER — Ambulatory Visit (INDEPENDENT_AMBULATORY_CARE_PROVIDER_SITE_OTHER): Payer: Medicare Other | Admitting: Cardiovascular Disease

## 2012-03-27 VITALS — BP 129/72 | HR 67 | Wt 271.0 lb

## 2012-03-27 DIAGNOSIS — I10 Essential (primary) hypertension: Secondary | ICD-10-CM

## 2012-03-27 DIAGNOSIS — Z954 Presence of other heart-valve replacement: Secondary | ICD-10-CM

## 2012-03-27 DIAGNOSIS — N2889 Other specified disorders of kidney and ureter: Secondary | ICD-10-CM

## 2012-03-27 DIAGNOSIS — E785 Hyperlipidemia, unspecified: Secondary | ICD-10-CM

## 2012-03-27 DIAGNOSIS — I4891 Unspecified atrial fibrillation: Secondary | ICD-10-CM

## 2012-03-27 NOTE — Progress Notes (Signed)
Patient ID: [name], male   DOB: Nov 16, 1933, 78 y.o.   MRN: [id number] He has a history of CAD, Aortic stenosis, status post CABG and [employer] aortic valve replacement 05/2002, permanent atrial fibrillation, hypertension, hyperlipidemia. Last nuclear study 04/2010: Low-risk study with mild fixed basal to mid inferior perfusion defect defect representing diaphragmatic attenuation versus prior infarct, no ischemia. Last echo 04/2010: Mild LVH, EF 55%, diastolic dysfunction indeterminate, mechanical aortic valve prosthesis, no significant AI, mean gradient 18, borderline dilated aortic root, moderate LAE, mild RAE, PASP 30.  Saw PA in June and amlodipine added for elevated BP.  Improved  TSH and BMET ordered and normal  Memory has gotten quite poor  ROS: Denies fever, malais, weight loss, blurry vision, decreased visual acuity, cough, sputum, SOB, hemoptysis, pleuritic pain, palpitaitons, heartburn, abdominal pain, melena, lower extremity edema, claudication, or rash.  All other systems reviewed and negative  General: Affect appropriate Obese white male HEENT: normal Neck supple with no adenopathy JVP normal no bruits no thyromegaly Lungs clear with no wheezing and good diaphragmatic motion Heart:  S1/S2click SEM  , no rub, gallop or click PMI normal Abdomen: benighn, BS positve, no tenderness, no AAA no bruit.  No HSM or HJR Distal pulses intact with no bruits No edema Neuro non-focal Skin warm and dry No muscular weakness   Current Outpatient Prescriptions  Medication Sig Dispense Refill  . acetaminophen (TYLENOL) 325 MG tablet 2 tabs po bid       . amLODipine (NORVASC) 5 MG tablet Take 1 tablet (5 mg total) by mouth daily.  30 tablet  11  . amoxicillin (AMOXIL) 500 MG capsule Take 500 mg by mouth as needed. Dental       . atorvastatin (LIPITOR) 40 MG tablet TAKE 1 TABLET BY MOUTH DAILY  90 tablet  4  . carvedilol (COREG) 25 MG tablet TAKE 1 TABLET  BY MOUTH 2  TIMES DAILY WITH A  MEAL.  120 tablet  11  . digoxin (LANOXIN) 0.125 MG tablet TAKE ONE TABLET BY MOUTH DAILY  90 tablet  3  . KRILL OIL PO Take 1 tablet by mouth daily.      . Multiple Vitamin (MULTIVITAMIN) tablet Take 1 tablet by mouth daily.        . nitroGLYCERIN (NITROSTAT) 0.4 MG SL tablet Place 0.4 mg under the tongue every 5 (five) minutes as needed.        . potassium chloride (KLOR-CON) 8 MEQ CR tablet Take 8 mEq by mouth daily.        . ramipril (ALTACE) 5 MG tablet Take 5 mg by mouth 2 (two) times daily.      . ranitidine (ZANTAC) 150 MG tablet Take 150 mg by mouth 2 (two) times daily.      . solifenacin (VESICARE) 10 MG tablet Take 10 mg by mouth daily.      . Tamsulosin HCl (FLOMAX) 0.4 MG CAPS Take 0.4 mg by mouth daily.       [name] torsemide (DEMADEX) 10 MG tablet Take 20 mg by mouth daily.      . vitamin B-12 (CYANOCOBALAMIN) 1000 MCG tablet Take 1,000 mcg by mouth daily.        [name] warfarin (COUMADIN) 5 MG tablet USE AS DIRECTED BY ANTICOAGULATION CLINIC  180 tablet  1  . DISCONTD: carvedilol (COREG) 25 MG tablet Take 1 tablet (25 mg total) by mouth 2 (two) times daily with a meal.  60 tablet  0  . DISCONTD:  ramipril (ALTACE) 10 MG capsule Take 1 tablet by mouth once a day  30 capsule  6    Allergies  Review of patient's allergies indicates no known allergies.  Electrocardiogram:  02/18/12  afib rate 53 digoxen effect othewise normal  Assessment and Plan

## 2012-03-27 NOTE — Assessment & Plan Note (Signed)
Good rate control and anticoagulation  

## 2012-03-27 NOTE — Assessment & Plan Note (Signed)
Improved continue amlodipine

## 2012-03-27 NOTE — Patient Instructions (Signed)
Your physician wants you to follow-up in:  6 MONTHS WITH DR NISHAN  You will receive a reminder letter in the mail two months in advance. If you don't receive a letter, please call our office to schedule the follow-up appointment. Your physician recommends that you continue on your current medications as directed. Please refer to the Current Medication list given to you today. 

## 2012-03-27 NOTE — Assessment & Plan Note (Signed)
Cholesterol is at goal.  Continue current dose of statin and diet Rx.  No myalgias or side effects.  F/U  LFT's in 6 months. No results found for this basename: [facility]           Labs with Dr Clarene Duke

## 2012-03-27 NOTE — Assessment & Plan Note (Signed)
Normal on exam  Continue anticoagulation.  SBE prophylaxis

## 2012-03-28 ENCOUNTER — Ambulatory Visit (HOSPITAL_COMMUNITY)
Admission: RE | Admit: 2012-03-28 | Discharge: 2012-03-28 | Disposition: A | Discharge status: Home / Self Care | Payer: Medicare Other | Source: Ambulatory Visit | Attending: Interventional Radiology | Admitting: Interventional Radiology

## 2012-03-28 DIAGNOSIS — Z85528 Personal history of other malignant neoplasm of kidney: Secondary | ICD-10-CM | POA: Insufficient documentation

## 2012-03-28 DIAGNOSIS — Z09 Encounter for follow-up examination after completed treatment for conditions other than malignant neoplasm: Secondary | ICD-10-CM | POA: Insufficient documentation

## 2012-03-28 DIAGNOSIS — N2889 Other specified disorders of kidney and ureter: Secondary | ICD-10-CM

## 2012-03-28 DIAGNOSIS — N289 Disorder of kidney and ureter, unspecified: Secondary | ICD-10-CM | POA: Insufficient documentation

## 2012-03-28 MED ORDER — IOHEXOL 300 MG/ML  SOLN
100.0000 mL | Freq: Once | INTRAMUSCULAR | Status: AC | PRN
  Administered 2012-03-28: 100 mL via INTRAVENOUS

## 2012-04-01 ENCOUNTER — Ambulatory Visit (INDEPENDENT_AMBULATORY_CARE_PROVIDER_SITE_OTHER): Payer: Medicare Other | Admitting: Pharmacist

## 2012-04-01 DIAGNOSIS — Z954 Presence of other heart-valve replacement: Secondary | ICD-10-CM

## 2012-04-01 DIAGNOSIS — I4891 Unspecified atrial fibrillation: Secondary | ICD-10-CM

## 2012-04-01 DIAGNOSIS — Z7901 Long term (current) use of anticoagulants: Secondary | ICD-10-CM

## 2012-04-01 DIAGNOSIS — I359 Nonrheumatic aortic valve disorder, unspecified: Secondary | ICD-10-CM

## 2012-04-03 ENCOUNTER — Ambulatory Visit
Admission: RE | Admit: 2012-04-03 | Discharge: 2012-04-03 | Disposition: A | Discharge status: Home / Self Care | Payer: Medicare Other | Source: Ambulatory Visit | Attending: Interventional Radiology | Admitting: Interventional Radiology

## 2012-04-03 NOTE — Progress Notes (Signed)
Patient denies hematuria.  Frequent urination.   Denies pain associated w/ RFA of Right Renal.

## 2012-04-04 ENCOUNTER — Other Ambulatory Visit: Payer: Self-pay | Admitting: Interventional Radiology

## 2012-04-04 DIAGNOSIS — C649 Malignant neoplasm of unspecified kidney, except renal pelvis: Secondary | ICD-10-CM

## 2012-04-07 NOTE — Progress Notes (Signed)
Patient ID: [name], male   DOB: [birth year], 78 y.o.   MRN: [id number]  ESTABLISHED PATIENT OFFICE VISIT  Chief Complaint: Status post percutaneous radiofrequency ablation of a right renal cell carcinoma on 04/04/2009.  History: [name] returns for follow-up. He remains asymptomatic and has had no abdominal pain, flank pain or hematuria.  Review of Systems: No fever or chills. No significant weight loss. No nausea or vomiting.  Exam: Vital signs: Blood pressure 126/69, pulse 68, respirations 17, temperature 98, oxygen saturation 98% on room air. General: No distress. Abdomen: Soft and nontender. No flank tenderness.  Labs: BUN 15, creatinine 1.0, estimated GFR 76 ml/minute.  Imaging: Follow-up CT of the abdomen was performed with and without contrast on 03/28/2012. This shows stable ablation defect of the posterior lower right kidney with no evidence of recurrent enhancing tumor. There is a separate higher lesion of the posterior right renal cortex that shows contrast enhancement and increase in size since prior imaging. This lesion was reported to measure approximately 15 mm. By my personal measurement, the lesion measures approximately 13 mm. Another lesion of the right kidney is stable.  Assessment and Plan: I met with [name] and his wife. We reviewed imaging findings. There is no evidence of recurrent carcinoma at the site of prior radiofrequency ablation 3 years after treatment. A separate lesion of the posterior right kidney appears complex and demonstrates probable mild enhancement by CT. This lesion has increased in size and may represent a low grade enlarging neoplasm or an enlarging complex cyst. I have recommended that we obtain MRI of the abdomen with and without gadolinium to further characterize the enlarging lesion of the right kidney. The patient is agreeable. After the scan, I will meet back with [name] and review findings.

## 2012-04-08 ENCOUNTER — Other Ambulatory Visit (HOSPITAL_COMMUNITY): Payer: Medicare Other

## 2012-04-09 ENCOUNTER — Other Ambulatory Visit: Payer: Self-pay | Admitting: Interventional Radiology

## 2012-04-09 DIAGNOSIS — C649 Malignant neoplasm of unspecified kidney, except renal pelvis: Secondary | ICD-10-CM

## 2012-04-10 ENCOUNTER — Ambulatory Visit (HOSPITAL_COMMUNITY)
Admission: RE | Admit: 2012-04-10 | Discharge: 2012-04-10 | Disposition: A | Discharge status: Home / Self Care | Payer: Medicare Other | Source: Ambulatory Visit | Attending: Interventional Radiology | Admitting: Interventional Radiology

## 2012-04-10 DIAGNOSIS — N281 Cyst of kidney, acquired: Secondary | ICD-10-CM | POA: Insufficient documentation

## 2012-04-10 DIAGNOSIS — C649 Malignant neoplasm of unspecified kidney, except renal pelvis: Secondary | ICD-10-CM

## 2012-04-10 DIAGNOSIS — N289 Disorder of kidney and ureter, unspecified: Secondary | ICD-10-CM | POA: Insufficient documentation

## 2012-04-10 LAB — CREATININE, SERUM: GFR calc Af Amer: >90 mL/min (ref >90)

## 2012-04-10 MED ORDER — GADOBENATE DIMEGLUMINE 529 MG/ML IV SOLN
20.0000 mL | Freq: Once | INTRAVENOUS | Status: AC | PRN
  Administered 2012-04-10: 20 mL via INTRAVENOUS

## 2012-04-22 ENCOUNTER — Ambulatory Visit
Admission: RE | Admit: 2012-04-22 | Discharge: 2012-04-22 | Disposition: A | Discharge status: Home / Self Care | Payer: Medicare Other | Source: Ambulatory Visit | Attending: Interventional Radiology | Admitting: Interventional Radiology

## 2012-04-22 DIAGNOSIS — C649 Malignant neoplasm of unspecified kidney, except renal pelvis: Secondary | ICD-10-CM

## 2012-04-23 ENCOUNTER — Other Ambulatory Visit: Payer: Self-pay | Admitting: Internal Medicine

## 2012-04-23 ENCOUNTER — Other Ambulatory Visit: Payer: Self-pay | Admitting: Cardiovascular Disease

## 2012-05-13 ENCOUNTER — Other Ambulatory Visit (HOSPITAL_COMMUNITY): Payer: Self-pay | Admitting: Interventional Radiology

## 2012-05-13 ENCOUNTER — Telehealth: Payer: Self-pay | Admitting: Cardiovascular Disease

## 2012-05-13 DIAGNOSIS — N289 Disorder of kidney and ureter, unspecified: Secondary | ICD-10-CM

## 2012-05-13 NOTE — Telephone Encounter (Signed)
Has afib and AVR should probably be bridged

## 2012-05-13 NOTE — Telephone Encounter (Signed)
PT HAVING ABALATION ON 10-25, PT ON COUMADIN, DOES HE NEED LOVANOX BRIDGE?

## 2012-05-13 NOTE — Telephone Encounter (Signed)
PT NEEDS TO HOLD COUMADIN 5 DAYS PRIOR TO PROCEDURE  NOT SURE IF NEEDS BRIDGED  WILL FORWARD TO DR [name] FOR REVIEW HAVING RADIOFREQUENCY ABLATION WITH DR YAMAGATA 10-25./CY

## 2012-05-15 NOTE — Telephone Encounter (Signed)
TINA IS AWARE, PER TINA NOTED RESPONSE AND SPOKE WITH COUMADIN CLINIC TO  MAKE ARRANGEMENTS  WILL ALSO FORWARD THIS MESSAGE AS WELL.[name]/CY

## 2012-05-16 ENCOUNTER — Ambulatory Visit (INDEPENDENT_AMBULATORY_CARE_PROVIDER_SITE_OTHER): Payer: Medicare Other | Admitting: *Deleted

## 2012-05-16 DIAGNOSIS — I4891 Unspecified atrial fibrillation: Secondary | ICD-10-CM

## 2012-05-16 DIAGNOSIS — Z7901 Long term (current) use of anticoagulants: Secondary | ICD-10-CM

## 2012-05-16 DIAGNOSIS — Z954 Presence of other heart-valve replacement: Secondary | ICD-10-CM

## 2012-05-16 DIAGNOSIS — I359 Nonrheumatic aortic valve disorder, unspecified: Secondary | ICD-10-CM

## 2012-05-16 LAB — POCT INR: INR: 1.9

## 2012-05-16 MED ORDER — ENOXAPARIN SODIUM 120 MG/0.8ML ~~LOC~~ SOLN: 120.0000 mg | Freq: Two times a day (BID) | SUBCUTANEOUS | Status: DC

## 2012-05-16 NOTE — Patient Instructions (Addendum)
For ablation on 10/25  10/19 last day for coumadin 10/20 no lovenox or coumadin today 10/21 lovenox 120 mg 9 am and 9 pm 10/22 lovenox 120 mg 9 am and 9 pm 10/23 lovenox 120 mg 9 am and 9 pm 10/24 lovenox 120 mg 9 am only (no evening dose) 10/25 nothing prior to procedure   After procedure doctor will tell you when to start back on coumadin usually same day of procedure When you start back on your coumadin take an extra 1/2 tablet for the first 2 days Continue your lovenox after your procedure until you come back to clinic

## 2012-05-16 NOTE — Telephone Encounter (Signed)
Pt given instructions for Lovenox bridge during visit today.

## 2012-05-27 ENCOUNTER — Other Ambulatory Visit: Payer: Self-pay | Admitting: Cardiovascular Disease

## 2012-06-09 ENCOUNTER — Telehealth: Payer: Self-pay | Admitting: *Deleted

## 2012-06-09 ENCOUNTER — Telehealth: Payer: Self-pay | Admitting: Emergency Medicine

## 2012-06-09 NOTE — Telephone Encounter (Signed)
[name] HAD LM AT 1:49PM ? ABOUT LOVENOX BRIDGING AND PT. BEING SICK RECENTLY.  PT HAS HAD A COLD/COUGH FOR THE PAST 2-3 WKS, NO PHLEGM OR NOSE DRAINAGE.  SHE ALSO HAD A CONCERN  IF PT NEEDS TO BE  ON AN ANTI-B. PRIOR TO PROCEDURE.  HE HAS TO DO THIS WITH DENTAL WORK.  3:15PM- TOLD [name] I WILL S/W DR Fredia Sorrow TO ADDRESS THESE CONCERNS AND WILL CALL HER BACK MID WK. ( DR GY IS OFF THIS WEEK)   06-11-12- 2:25PM- CALLED [name] TO MAKE HER AWARE THAT [name] WILL NOT NEED TO BE ON AN ANTI-B PRIOR TO PROCEDURE AS HE WILL BE GIVEN IV- ANIT-B DAY OF PROCEDURE.  ALSO, AS LONG AS HE IS NOT RUNNING A FEVER AND BECOMING WORSE THEN WE CAN DO PROCEDURE AS SCHEDULED AND HE CAN BEGIN HIS BRIDGING AS SCHEDULE.

## 2012-06-09 NOTE — Telephone Encounter (Signed)
Mr Bruington call regarding a cold he developed about 2 days ago, his concern is he has upcoming surgery scheduled and begins his lovenox bridging 06/14/2012, he doesn't have a fever, he has no cough, he does have some drainage from his nose, I have instructed him to see his primary care MD today if possible, he states he will do so.

## 2012-06-11 ENCOUNTER — Encounter (HOSPITAL_COMMUNITY): Payer: Self-pay | Admitting: Pharmacy Technician

## 2012-06-12 ENCOUNTER — Other Ambulatory Visit: Payer: Self-pay | Admitting: Radiology

## 2012-06-16 ENCOUNTER — Ambulatory Visit (HOSPITAL_COMMUNITY)
Admission: RE | Admit: 2012-06-16 | Discharge: 2012-06-16 | Disposition: A | Discharge status: Home / Self Care | Payer: Medicare Other | Source: Ambulatory Visit | Attending: Interventional Radiology | Admitting: Interventional Radiology

## 2012-06-16 ENCOUNTER — Encounter (HOSPITAL_COMMUNITY): Payer: Self-pay

## 2012-06-16 ENCOUNTER — Encounter (HOSPITAL_COMMUNITY)
Admission: RE | Admit: 2012-06-16 | Discharge: 2012-06-16 | Disposition: A | Discharge status: Home / Self Care | Payer: Medicare Other | Source: Ambulatory Visit | Attending: Interventional Radiology | Admitting: Interventional Radiology

## 2012-06-16 DIAGNOSIS — Z951 Presence of aortocoronary bypass graft: Secondary | ICD-10-CM | POA: Insufficient documentation

## 2012-06-16 DIAGNOSIS — I2510 Atherosclerotic heart disease of native coronary artery without angina pectoris: Secondary | ICD-10-CM | POA: Insufficient documentation

## 2012-06-16 DIAGNOSIS — Z01812 Encounter for preprocedural laboratory examination: Secondary | ICD-10-CM | POA: Insufficient documentation

## 2012-06-16 DIAGNOSIS — R059 Cough, unspecified: Secondary | ICD-10-CM

## 2012-06-16 DIAGNOSIS — I10 Essential (primary) hypertension: Secondary | ICD-10-CM | POA: Insufficient documentation

## 2012-06-16 DIAGNOSIS — I4891 Unspecified atrial fibrillation: Secondary | ICD-10-CM | POA: Insufficient documentation

## 2012-06-16 DIAGNOSIS — I700 Atherosclerosis of aorta: Secondary | ICD-10-CM | POA: Insufficient documentation

## 2012-06-16 HISTORY — DX: Gastro-esophageal reflux disease without esophagitis: K21.9

## 2012-06-16 HISTORY — DX: Unspecified osteoarthritis, unspecified site: M19.90

## 2012-06-16 HISTORY — DX: Shortness of breath: R06.02

## 2012-06-16 HISTORY — DX: Cough, unspecified: R05.9

## 2012-06-16 HISTORY — DX: Other chronic pain: G89.29

## 2012-06-16 HISTORY — DX: Dorsalgia, unspecified: M54.9

## 2012-06-16 HISTORY — DX: Cough: R05

## 2012-06-16 HISTORY — DX: Malignant (primary) neoplasm, unspecified: C80.1

## 2012-06-16 LAB — BASIC METABOLIC PANEL
CO2: 28 mEq/L (ref 19–32)
Calcium: 9.8 mg/dL (ref 8.4–10.5)
GFR calc non Af Amer: 82 mL/min — ABNORMAL LOW (ref >90)
Glucose, Bld: 89 mg/dL (ref 70–99)
Potassium: 4.1 mEq/L (ref 3.5–5.1)
Sodium: 137 mEq/L (ref 135–145)

## 2012-06-16 LAB — CBC
Hemoglobin: 13.1 g/dL (ref 13.0–17.0)
MCH: 28.7 pg (ref 26.0–34.0)
Platelets: 234 10*3/uL (ref 150–400)
RBC: 4.56 MIL/uL (ref 4.22–5.81)
WBC: 6.4 10*3/uL (ref 4.0–10.5)

## 2012-06-16 LAB — PROTIME-INR: Prothrombin Time: 25.9 seconds — ABNORMAL HIGH (ref 11.6–15.2)

## 2012-06-16 LAB — APTT: aPTT: 47 seconds — ABNORMAL HIGH (ref 24–37)

## 2012-06-16 NOTE — Pre-Procedure Instructions (Signed)
PT'S PT, INR, PTT ELEVATED TODAY--LAST COUMADIN WAS YESTERDAY 06/15/12--ORDER PLACED IN EPIC TO REPEAT PT, INR ON ARRIVAL FOR SURGERY--AS PER ANESTHESIOLOGIST'S GUIDELINES.

## 2012-06-16 NOTE — Patient Instructions (Addendum)
YOUR SURGERY IS SCHEDULED AT [facility]  ON:  Friday  10/25  AT 11:45 AM  REPORT TO RADIOLOGY  AT:  8:30 AM      PHONE # FOR SHORT STAY IS [phone]  DO NOT EAT OR DRINK ANYTHING AFTER MIDNIGHT THE NIGHT BEFORE YOUR SURGERY.  YOU MAY BRUSH YOUR TEETH, RINSE OUT YOUR MOUTH--BUT NO WATER, NO FOOD, NO CHEWING GUM, NO MINTS, NO CANDIES, NO CHEWING TOBACCO.  PLEASE TAKE THE FOLLOWING MEDICATIONS THE AM OF YOUR SURGERY WITH A FEW SIPS OF WATER:  TYLENOL, AMLODIPINE, CARVEDILOL, RANITIDINE.  BRING YOUR NITROGYCERIN   IF YOU USE INHALERS--USE YOUR INHALERS THE AM OF YOUR SURGERY AND BRING INHALERS TO THE HOSPITAL -TAKE TO SURGERY.    IF YOU ARE DIABETIC:  DO NOT TAKE ANY DIABETIC MEDICATIONS THE AM OF YOUR SURGERY.  IF YOU TAKE INSULIN IN THE EVENINGS--PLEASE ONLY TAKE 1/2 NORMAL EVENING DOSE THE NIGHT BEFORE YOUR SURGERY.  NO INSULIN THE AM OF YOUR SURGERY.  IF YOU HAVE SLEEP APNEA AND USE CPAP OR BIPAP--PLEASE BRING THE MASK AND THE TUBING.  DO NOT BRING YOUR MACHINE.  DO NOT BRING VALUABLES, MONEY, CREDIT CARDS.  DO NOT WEAR JEWELRY, MAKE-UP, NAIL POLISH AND NO METAL PINS OR CLIPS IN YOUR HAIR. CONTACT LENS, DENTURES / PARTIALS, GLASSES SHOULD NOT BE WORN TO SURGERY AND IN MOST CASES-HEARING AIDS WILL NEED TO BE REMOVED.  BRING YOUR GLASSES CASE, ANY EQUIPMENT NEEDED FOR YOUR CONTACT LENS. FOR PATIENTS ADMITTED TO THE HOSPITAL--CHECK OUT TIME THE DAY OF DISCHARGE IS 11:00 AM.  ALL INPATIENT ROOMS ARE PRIVATE - WITH BATHROOM, TELEPHONE, TELEVISION AND WIFI INTERNET.  IF YOU ARE BEING DISCHARGED THE SAME DAY OF YOUR SURGERY--YOU CAN NOT DRIVE YOURSELF HOME--AND SHOULD NOT GO HOME ALONE BY TAXI OR BUS.  NO DRIVING OR OPERATING MACHINERY FOR 24 HOURS FOLLOWING ANESTHESIA / PAIN MEDICATIONS.  PLEASE MAKE ARRANGEMENTS FOR SOMEONE TO BE WITH YOU AT HOME THE FIRST 24 HOURS AFTER SURGERY. RESPONSIBLE DRIVER'S NAME___________________________                                               PHONE #    _______________________                                  PLEASE READ OVER ANY  FACT SHEETS THAT YOU WERE GIVEN: MRSA INFORMATION, BLOOD TRANSFUSION INFORMATION, INCENTIVE SPIROMETER INFORMATION.

## 2012-06-16 NOTE — Pre-Procedure Instructions (Signed)
EKG 02/18/12 AND CARDIOLOGY OFFICE NOTE 03/27/12 IN EPIC PREOP CBC, BMET, PT, PTT, T/S, CXR WERE DONE TODAY AT [facility] AS PER ORDERS DR. YAMAGATA AND ANESTHESIOLOGIST'S GUIDELINES

## 2012-06-16 NOTE — Progress Notes (Signed)
06/16/12 1346  OBSTRUCTIVE SLEEP APNEA  Have you ever been diagnosed with sleep apnea through a sleep study? No  Do you snore loudly (loud enough to be heard through closed doors)?  0  Do you often feel tired, fatigued, or sleepy during the daytime? 0  Has anyone observed you stop breathing during your sleep? 0  Do you have, or are you being treated for high blood pressure? 1  BMI more than 35 kg/m2? 0  Age over 76 years old? 1  Neck circumference greater than 40 cm/18 inches? 1  Gender: 1  Obstructive Sleep Apnea Score 4

## 2012-06-18 ENCOUNTER — Other Ambulatory Visit: Payer: Self-pay | Admitting: Radiology

## 2012-06-19 ENCOUNTER — Other Ambulatory Visit: Payer: Self-pay | Admitting: Radiology

## 2012-06-20 ENCOUNTER — Encounter (HOSPITAL_COMMUNITY): Payer: Self-pay | Admitting: Anesthesiology

## 2012-06-20 ENCOUNTER — Other Ambulatory Visit (HOSPITAL_COMMUNITY): Payer: Medicare Other

## 2012-06-20 ENCOUNTER — Observation Stay (HOSPITAL_COMMUNITY)
Admission: RE | Admit: 2012-06-20 | Discharge: 2012-06-21 | Disposition: A | Discharge status: Home / Self Care | Payer: Medicare Other | Source: Ambulatory Visit | Attending: Interventional Radiology | Admitting: Interventional Radiology

## 2012-06-20 ENCOUNTER — Ambulatory Visit (HOSPITAL_COMMUNITY)
Admission: RE | Admit: 2012-06-20 | Discharge: 2012-06-20 | Disposition: A | Discharge status: Home / Self Care | Payer: Medicare Other | Source: Ambulatory Visit | Attending: Interventional Radiology | Admitting: Interventional Radiology

## 2012-06-20 ENCOUNTER — Ambulatory Visit (HOSPITAL_COMMUNITY): Payer: Medicare Other | Admitting: Anesthesiology

## 2012-06-20 ENCOUNTER — Encounter (HOSPITAL_COMMUNITY)
Admission: RE | Disposition: A | Discharge status: Home / Self Care | Payer: Self-pay | Source: Ambulatory Visit | Attending: Interventional Radiology

## 2012-06-20 VITALS — BP 164/56 | HR 55 | Temp 97.8°F | Resp 14

## 2012-06-20 DIAGNOSIS — R05 Cough: Secondary | ICD-10-CM | POA: Insufficient documentation

## 2012-06-20 DIAGNOSIS — I359 Nonrheumatic aortic valve disorder, unspecified: Secondary | ICD-10-CM

## 2012-06-20 DIAGNOSIS — R609 Edema, unspecified: Secondary | ICD-10-CM | POA: Insufficient documentation

## 2012-06-20 DIAGNOSIS — R5381 Other malaise: Secondary | ICD-10-CM | POA: Insufficient documentation

## 2012-06-20 DIAGNOSIS — I10 Essential (primary) hypertension: Secondary | ICD-10-CM | POA: Insufficient documentation

## 2012-06-20 DIAGNOSIS — R0602 Shortness of breath: Secondary | ICD-10-CM

## 2012-06-20 DIAGNOSIS — Z79899 Other long term (current) drug therapy: Secondary | ICD-10-CM | POA: Insufficient documentation

## 2012-06-20 DIAGNOSIS — I4891 Unspecified atrial fibrillation: Secondary | ICD-10-CM | POA: Insufficient documentation

## 2012-06-20 DIAGNOSIS — I5032 Chronic diastolic (congestive) heart failure: Secondary | ICD-10-CM

## 2012-06-20 DIAGNOSIS — R4182 Altered mental status, unspecified: Secondary | ICD-10-CM

## 2012-06-20 DIAGNOSIS — Z951 Presence of aortocoronary bypass graft: Secondary | ICD-10-CM | POA: Insufficient documentation

## 2012-06-20 DIAGNOSIS — I2510 Atherosclerotic heart disease of native coronary artery without angina pectoris: Secondary | ICD-10-CM | POA: Insufficient documentation

## 2012-06-20 DIAGNOSIS — Z954 Presence of other heart-valve replacement: Secondary | ICD-10-CM

## 2012-06-20 DIAGNOSIS — N289 Disorder of kidney and ureter, unspecified: Secondary | ICD-10-CM

## 2012-06-20 DIAGNOSIS — R079 Chest pain, unspecified: Secondary | ICD-10-CM

## 2012-06-20 DIAGNOSIS — K219 Gastro-esophageal reflux disease without esophagitis: Secondary | ICD-10-CM | POA: Insufficient documentation

## 2012-06-20 DIAGNOSIS — E785 Hyperlipidemia, unspecified: Secondary | ICD-10-CM | POA: Insufficient documentation

## 2012-06-20 DIAGNOSIS — Z7901 Long term (current) use of anticoagulants: Secondary | ICD-10-CM

## 2012-06-20 DIAGNOSIS — Z85528 Personal history of other malignant neoplasm of kidney: Secondary | ICD-10-CM | POA: Insufficient documentation

## 2012-06-20 DIAGNOSIS — D4959 Neoplasm of unspecified behavior of other genitourinary organ: Principal | ICD-10-CM | POA: Insufficient documentation

## 2012-06-20 DIAGNOSIS — R5383 Other fatigue: Secondary | ICD-10-CM | POA: Insufficient documentation

## 2012-06-20 DIAGNOSIS — E876 Hypokalemia: Secondary | ICD-10-CM

## 2012-06-20 DIAGNOSIS — M549 Dorsalgia, unspecified: Secondary | ICD-10-CM | POA: Insufficient documentation

## 2012-06-20 DIAGNOSIS — R059 Cough, unspecified: Secondary | ICD-10-CM | POA: Insufficient documentation

## 2012-06-20 LAB — TYPE AND SCREEN
ABO/RH(D): O POS
Antibody Screen: NEGATIVE

## 2012-06-20 LAB — PROTIME-INR
INR: 1.1 (ref 0.00–1.49)
Prothrombin Time: 14.1 seconds (ref 11.6–15.2)

## 2012-06-20 SURGERY — RADIO FREQUENCY ABLATION
Anesthesia: General | Laterality: Right | Wound class: Clean

## 2012-06-20 MED ORDER — POTASSIUM CHLORIDE CRYS ER 10 MEQ PO TBCR
10.0000 meq | EXTENDED_RELEASE_TABLET | Freq: Every day | ORAL | Status: DC
Start: 2012-06-21 — End: 2012-06-21
  Filled 2012-06-20: qty 1

## 2012-06-20 MED ORDER — HYDROCODONE-ACETAMINOPHEN 5-325 MG PO TABS: 1.0000 | ORAL_TABLET | ORAL | Status: DC | PRN

## 2012-06-20 MED ORDER — TAMSULOSIN HCL 0.4 MG PO CAPS
0.4000 mg | ORAL_CAPSULE | Freq: Every day | ORAL | Status: DC
  Filled 2012-06-20: qty 1

## 2012-06-20 MED ORDER — PROMETHAZINE HCL 25 MG/ML IJ SOLN: 6.2500 mg | INTRAMUSCULAR | Status: DC | PRN

## 2012-06-20 MED ORDER — ONDANSETRON HCL 4 MG/2ML IJ SOLN: 4.0000 mg | Freq: Four times a day (QID) | INTRAMUSCULAR | Status: DC | PRN

## 2012-06-20 MED ORDER — CHLORHEXIDINE GLUCONATE 0.12 % MT SOLN
15.0000 mL | Freq: Two times a day (BID) | OROMUCOSAL | Status: DC
  Administered 2012-06-20 – 2012-06-21 (×2): 15 mL via OROMUCOSAL
  Filled 2012-06-20 (×4): qty 15

## 2012-06-20 MED ORDER — FENTANYL CITRATE 0.05 MG/ML IJ SOLN
INTRAMUSCULAR | Status: DC | PRN
  Administered 2012-06-20 (×5): 50 ug via INTRAVENOUS

## 2012-06-20 MED ORDER — ATORVASTATIN CALCIUM 40 MG PO TABS
40.0000 mg | ORAL_TABLET | Freq: Every day | ORAL | Status: DC
  Filled 2012-06-20: qty 1

## 2012-06-20 MED ORDER — IOHEXOL 300 MG/ML  SOLN
75.0000 mL | Freq: Once | INTRAMUSCULAR | Status: AC | PRN
  Administered 2012-06-20: 75 mL via INTRAVENOUS

## 2012-06-20 MED ORDER — ONDANSETRON HCL 4 MG/2ML IJ SOLN
4.0000 mg | Freq: Four times a day (QID) | INTRAMUSCULAR | Status: DC | PRN
  Filled 2012-06-20: qty 2

## 2012-06-20 MED ORDER — PROPOFOL 10 MG/ML IV BOLUS
INTRAVENOUS | Status: DC | PRN
  Administered 2012-06-20: 200 mg via INTRAVENOUS

## 2012-06-20 MED ORDER — NITROGLYCERIN 0.4 MG SL SUBL
0.4000 mg | SUBLINGUAL_TABLET | SUBLINGUAL | Status: DC | PRN
  Filled 2012-06-20: qty 25

## 2012-06-20 MED ORDER — DIGOXIN 125 MCG PO TABS
0.1250 mg | ORAL_TABLET | Freq: Every day | ORAL | Status: DC
  Filled 2012-06-20: qty 1

## 2012-06-20 MED ORDER — MIDAZOLAM HCL 5 MG/5ML IJ SOLN
INTRAMUSCULAR | Status: DC | PRN
  Administered 2012-06-20 (×2): 1 mg via INTRAVENOUS

## 2012-06-20 MED ORDER — SODIUM CHLORIDE 0.9 % IV SOLN
INTRAVENOUS | Status: DC
  Administered 2012-06-20: 18:00:00 via INTRAVENOUS

## 2012-06-20 MED ORDER — SENNOSIDES-DOCUSATE SODIUM 8.6-50 MG PO TABS
1.0000 | ORAL_TABLET | Freq: Every day | ORAL | Status: DC | PRN
  Filled 2012-06-20: qty 1

## 2012-06-20 MED ORDER — DOCUSATE SODIUM 100 MG PO CAPS
100.0000 mg | ORAL_CAPSULE | Freq: Two times a day (BID) | ORAL | Status: DC
Start: 2012-06-20 — End: 2012-06-21
  Administered 2012-06-20: 100 mg via ORAL
  Filled 2012-06-20 (×3): qty 1

## 2012-06-20 MED ORDER — AMLODIPINE BESYLATE 5 MG PO TABS
5.0000 mg | ORAL_TABLET | Freq: Every morning | ORAL | Status: DC
  Filled 2012-06-20: qty 1

## 2012-06-20 MED ORDER — ROCURONIUM BROMIDE 100 MG/10ML IV SOLN
INTRAVENOUS | Status: DC | PRN
  Administered 2012-06-20: 50 mg via INTRAVENOUS

## 2012-06-20 MED ORDER — GLYCOPYRROLATE 0.2 MG/ML IJ SOLN
INTRAMUSCULAR | Status: DC | PRN
  Administered 2012-06-20: 0.6 mg via INTRAVENOUS

## 2012-06-20 MED ORDER — HYDROMORPHONE HCL PF 1 MG/ML IJ SOLN
INTRAMUSCULAR | Status: AC
  Filled 2012-06-20: qty 1

## 2012-06-20 MED ORDER — HYDROCODONE-ACETAMINOPHEN 5-325 MG PO TABS
1.0000 | ORAL_TABLET | ORAL | Status: DC | PRN
  Filled 2012-06-20: qty 2

## 2012-06-20 MED ORDER — RAMIPRIL 5 MG PO CAPS
5.0000 mg | ORAL_CAPSULE | Freq: Two times a day (BID) | ORAL | Status: DC
Start: 2012-06-20 — End: 2012-06-21
  Filled 2012-06-20: qty 1

## 2012-06-20 MED ORDER — BIOTENE DRY MOUTH MT LIQD: 15.0000 mL | Freq: Two times a day (BID) | OROMUCOSAL | Status: DC

## 2012-06-20 MED ORDER — HYDROMORPHONE HCL PF 1 MG/ML IJ SOLN
0.2500 mg | INTRAMUSCULAR | Status: DC | PRN
  Administered 2012-06-20 (×2): 0.5 mg via INTRAVENOUS

## 2012-06-20 MED ORDER — CARVEDILOL 25 MG PO TABS
25.0000 mg | ORAL_TABLET | Freq: Two times a day (BID) | ORAL | Status: DC
Start: 2012-06-20 — End: 2012-06-21
  Filled 2012-06-20: qty 1

## 2012-06-20 MED ORDER — DOCUSATE SODIUM 100 MG PO CAPS
100.0000 mg | ORAL_CAPSULE | Freq: Two times a day (BID) | ORAL | Status: DC
  Filled 2012-06-20: qty 1

## 2012-06-20 MED ORDER — TORSEMIDE 20 MG PO TABS
20.0000 mg | ORAL_TABLET | Freq: Every day | ORAL | Status: DC
Start: 2012-06-20 — End: 2012-06-21
  Filled 2012-06-20: qty 1

## 2012-06-20 MED ORDER — CEFAZOLIN SODIUM-DEXTROSE 2-3 GM-% IV SOLR
2.0000 g | INTRAVENOUS | Status: AC
  Administered 2012-06-20: 2 g via INTRAVENOUS
  Filled 2012-06-20 (×2): qty 50

## 2012-06-20 MED ORDER — ONDANSETRON HCL 4 MG/2ML IJ SOLN
INTRAMUSCULAR | Status: DC | PRN
  Administered 2012-06-20: 4 mg via INTRAVENOUS

## 2012-06-20 MED ORDER — DARIFENACIN HYDROBROMIDE ER 15 MG PO TB24
15.0000 mg | ORAL_TABLET | Freq: Every day | ORAL | Status: DC
  Filled 2012-06-20: qty 1

## 2012-06-20 MED ORDER — NEOSTIGMINE METHYLSULFATE 1 MG/ML IJ SOLN
INTRAMUSCULAR | Status: DC | PRN
  Administered 2012-06-20: 4 mg via INTRAVENOUS

## 2012-06-20 MED ORDER — LACTATED RINGERS IV SOLN
INTRAVENOUS | Status: DC
  Administered 2012-06-20 (×2): via INTRAVENOUS

## 2012-06-20 MED ORDER — FAMOTIDINE 20 MG PO TABS
20.0000 mg | ORAL_TABLET | Freq: Two times a day (BID) | ORAL | Status: DC
  Filled 2012-06-20: qty 1

## 2012-06-20 NOTE — Progress Notes (Signed)
Day of Surgery  Subjective:   Pt doing well; no significant rt flank pain, nausea, vomiting, or hematuria Objective: Vital signs in last 24 hours: Temp:  [97.8 F (36.6 C)-99.1 F (37.3 C)] 97.8 F (36.6 C) (10/25 1500) Pulse Rate:  [41-142] 55  (10/25 1500) Resp:  [14-21] 14  (10/25 1500) BP: (142-198)/(55-86) 164/56 mmHg (10/25 1500) SpO2:  [93 %-100 %] 95 % (10/25 1500)    Intake/Output from previous day:   Intake/Output this shift: Total I/O In: 100 [I.V.:100] Out: 300 [Urine:300]  Rt flank clean and dry, NT;yellow urine in foley  Lab Results:  No results found for this basename: WBC:2,HGB:2,HCT:2,PLT:2 in the last 72 hours BMET No results found for this basename: NA:2,K:2,CL:2,CO2:2,GLUCOSE:2,BUN:2,CREATININE:2,CALCIUM:2 in the last 72 hours PT/INR  Basename 06/20/12 0900  LABPROT 14.1  INR 1.10   ABG No results found for this basename: PHART:2,PCO2:2,PO2:2,HCO3:2 in the last 72 hours  Studies/Results: Ct Guide Tissue Ablation  06/20/2012  *RADIOLOGY REPORT*  Clinical Data:  History of previously ablated right renal carcinoma.  New, enlarging and enhancing tumor of the posterior right kidney suspicious for a second renal carcinoma.  CT-GUIDED CORE BIOPSY OF RIGHT RENAL MASS. CT-GUIDED PERCUTANEOUS CRYOABLATION OF RIGHT RENAL MASS.  Anesthesia:  General  Medications: 2 grams IV Ancef. As antibiotic prophylaxis, Ancef was ordered pre-procedure and administered intravenously within one hour of incision.  Contrast:  75 ml Omnipaque-300 IV  Procedure:  The procedure, risks, benefits, and alternatives were explained to the patient.  Questions regarding the procedure were encouraged and answered.  The patient understands and consents to the procedure.  The patient was placed under general anesthesia.  Initial unenhanced CT was performed in a prone position to localize the right kidney.  The right flank region was prepped with Betadine in a sterile fashion, and a sterile drape  was applied covering the operative field.  A sterile gown and sterile gloves were used for the procedure.  A 17 gauge trocar needle was advanced into the right renal mass. Core biopsy was performed with an 18 gauge automated core biopsy device.  A total of 2 core samples were submitted in formalin for pathologic analysis.  Under CT guidance, a Galil Ice Rod Plus percutaneous cryoablation probe was advanced into the right renal mass.  Probe positioning was confirmed by CT prior to cryoablation.  Cryoablation was performed through the single probe.  Initial 10 minute cycle of cryoablation was performed.  This was followed by a 6 minute thaw cycle.  A second 10 minute cycle of cryoablation was then performed.  During ablation, periodic CT imaging was performed to monitor ice ball formation and morphology.  After active thaw, the cryoablation probe was removed.  Post-procedural CT was performed.  Complications: None  Findings:  For accurate tumor localization purposes, IV contrast was administered.  The lesion measures approximately 13 - 14 mm in maximal diameter.  After obtaining core biopsy, cryoablation was performed at the level of the exophytic interpolar renal mass. During ablation, CT demonstrates adequate ice ball formation encompassing the lesion.  There were no immediate complications.  IMPRESSION:  CT guided percutaneous core biopsy and cryoablation of right renal mass.  The patient will be observed overnight.  Initial follow-up will be performed in approximately 4 weeks.   Original Report Authenticated By: Reola Calkins, M.D.    Ct Biopsy  06/20/2012  *RADIOLOGY REPORT*  Clinical Data:  History of previously ablated right renal carcinoma.  New, enlarging and enhancing tumor of the posterior  right kidney suspicious for a second renal carcinoma.  CT-GUIDED CORE BIOPSY OF RIGHT RENAL MASS. CT-GUIDED PERCUTANEOUS CRYOABLATION OF RIGHT RENAL MASS.  Anesthesia:  General  Medications: 2 grams IV Ancef. As  antibiotic prophylaxis, Ancef was ordered pre-procedure and administered intravenously within one hour of incision.  Contrast:  75 ml Omnipaque-300 IV  Procedure:  The procedure, risks, benefits, and alternatives were explained to the patient.  Questions regarding the procedure were encouraged and answered.  The patient understands and consents to the procedure.  The patient was placed under general anesthesia.  Initial unenhanced CT was performed in a prone position to localize the right kidney.  The right flank region was prepped with Betadine in a sterile fashion, and a sterile drape was applied covering the operative field.  A sterile gown and sterile gloves were used for the procedure.  A 17 gauge trocar needle was advanced into the right renal mass. Core biopsy was performed with an 18 gauge automated core biopsy device.  A total of 2 core samples were submitted in formalin for pathologic analysis.  Under CT guidance, a Galil Ice Rod Plus percutaneous cryoablation probe was advanced into the right renal mass.  Probe positioning was confirmed by CT prior to cryoablation.  Cryoablation was performed through the single probe.  Initial 10 minute cycle of cryoablation was performed.  This was followed by a 6 minute thaw cycle.  A second 10 minute cycle of cryoablation was then performed.  During ablation, periodic CT imaging was performed to monitor ice ball formation and morphology.  After active thaw, the cryoablation probe was removed.  Post-procedural CT was performed.  Complications: None  Findings:  For accurate tumor localization purposes, IV contrast was administered.  The lesion measures approximately 13 - 14 mm in maximal diameter.  After obtaining core biopsy, cryoablation was performed at the level of the exophytic interpolar renal mass. During ablation, CT demonstrates adequate ice ball formation encompassing the lesion.  There were no immediate complications.  IMPRESSION:  CT guided percutaneous core  biopsy and cryoablation of right renal mass.  The patient will be observed overnight.  Initial follow-up will be performed in approximately 4 weeks.   Original Report Authenticated By: Reola Calkins, M.D.     Anti-infectives: Anti-infectives    None      Assessment/Plan: s/p rt renal mass cryoablation/bx 10/25; check am labs, gentle hydration; plan d/c in am if stable; f/u in IR clinic in 1 month .   LOS: 0 days    ALLRED,D [facility] 06/20/2012

## 2012-06-20 NOTE — Procedures (Signed)
Procedure:  CT guided percutaneous cryoablation of right renal mass Anesthesia:  General Findings:  Posterior interpolar right renal mass localized.  75 mL Omni 300 IV contrast administered for localization.  Biopsy with 18 G core needle via 17 G outer needle.  Cryoablation via single Galil Ice Rod Plus probe. Plan:  PACU recovery and overnight observation.

## 2012-06-20 NOTE — Anesthesia Postprocedure Evaluation (Signed)
Anesthesia Post Note  Patient: [name]  Procedure(s) Performed: Procedure(s) (LRB): RADIO FREQUENCY ABLATION (Right)  Anesthesia type: General  Patient location: PACU  Post pain: Pain level controlled  Post assessment: Post-op Vital signs reviewed  Last Vitals: BP 151/69  Pulse 41  Temp 37.3 C  Resp 15  SpO2 95%  Post vital signs: Reviewed  Level of consciousness: sedated  Complications: No apparent anesthesia complications

## 2012-06-20 NOTE — Transfer of Care (Signed)
Immediate Anesthesia Transfer of Care Note  Patient: [name]  Procedure(s) Performed: Procedure(s) (LRB) with comments: RADIO FREQUENCY ABLATION (Right) - Right Renal Cyro Ablation  Patient Location: PACU  Anesthesia Type: General  Level of Consciousness: awake, alert  and oriented  Airway & Oxygen Therapy: Patient Spontanous Breathing and Patient connected to face mask oxygen  Post-op Assessment: Report given to PACU RN and Post -op Vital signs reviewed and stable  Post vital signs: Reviewed and stable  Complications: No apparent anesthesia complications

## 2012-06-20 NOTE — H&P (Signed)
Agree.  For renal cryoablation of right kidney.

## 2012-06-20 NOTE — Anesthesia Preprocedure Evaluation (Signed)
Anesthesia Evaluation  Patient identified by MRN, date of birth, ID band Patient awake    Reviewed: Allergy & Precautions, H&P , NPO status , Patient's Chart, lab work & pertinent test results  Airway Mallampati: II TM Distance: >3 FB Neck ROM: Full    Dental No notable dental hx.    Pulmonary shortness of breath,  breath sounds clear to auscultation  Pulmonary exam normal       Cardiovascular hypertension, Pt. on medications and Pt. on home beta blockers + CAD + dysrhythmias Atrial Fibrillation + Valvular Problems/Murmurs AS Rhythm:Regular Rate:Normal  S/P aortic valve replacement.   Neuro/Psych negative neurological ROS  negative psych ROS   GI/Hepatic Neg liver ROS, GERD-  Medicated,  Endo/Other  negative endocrine ROS  Renal/GU negative Renal ROS  negative genitourinary   Musculoskeletal negative musculoskeletal ROS (+)   Abdominal   Peds negative pediatric ROS (+)  Hematology negative hematology ROS (+)   Anesthesia Other Findings   Reproductive/Obstetrics negative OB ROS                           Anesthesia Physical Anesthesia Plan  ASA: III  Anesthesia Plan: General   Post-op Pain Management:    Induction: Intravenous  Airway Management Planned: Oral ETT  Additional Equipment:   Intra-op Plan:   Post-operative Plan: Extubation in OR  Informed Consent: I have reviewed the patients History and Physical, chart, labs and discussed the procedure including the risks, benefits and alternatives for the proposed anesthesia with the patient or authorized representative who has indicated his/her understanding and acceptance.   Dental advisory given  Plan Discussed with: CRNA  Anesthesia Plan Comments:         Anesthesia Quick Evaluation

## 2012-06-20 NOTE — H&P (Signed)
[name] is an 78 y.o. male.   Chief Complaint: right renal mass HPI: Patient with history of RFA of a right lower pole renal cell carcinoma 03/2009 presents today for CT guided cryoablation/possible biopsy of an enlarging right posterior/interpolar renal lesion.  Past Medical History  Diagnosis Date  . Hypokalemia   . Hyperlipidemia   . Atrial fibrillation     longterm persistent  . CAD (coronary artery disease)   . HTN (hypertension)   . Cancer     RIGHT RENAL  . Shortness of breath     PT STATES RELATED TO HIS AF AND HEART BEING OUT OF RHYTHM  . Back pain, chronic     SINCE BACK INJURY / FRACTURE  . GERD (gastroesophageal reflux disease)   . Arthritis   . Cough 06/16/12    C/O OF COUGH / COLD FOR A COUPLE OF WEEKS    Past Surgical History  Procedure Date  . Aortic valve replacement   . Coronary artery bypass graft   . Thoracic t12 compression fracture   . Radiofrequency ablation kidney     RIGHT     Family History  Problem Relation Age of Onset  . Hypertension    . Diabetes     Social History:  reports that he has quit smoking. He does not have any smokeless tobacco history on file. He reports that he does not drink alcohol or use illicit drugs.  Allergies: No Known Allergies  No current facility-administered medications for this encounter. Current outpatient prescriptions:acetaminophen (TYLENOL) 325 MG tablet, Take 650 mg by mouth 2 (two) times daily. , Disp: , Rfl: ;  amLODipine (NORVASC) 5 MG tablet, Take 5 mg by mouth every morning., Disp: , Rfl: ;  amoxicillin (AMOXIL) 500 MG capsule, Take 500 mg by mouth as needed. Dental, Disp: , Rfl: ;  atorvastatin (LIPITOR) 40 MG tablet, Take 40 mg by mouth at bedtime., Disp: , Rfl:  carvedilol (COREG) 25 MG tablet, Take 25 mg by mouth 2 (two) times daily with a meal., Disp: , Rfl: ;  digoxin (LANOXIN) 0.125 MG tablet, Take 0.125 mg by mouth at bedtime., Disp: , Rfl: ;  enoxaparin (LOVENOX) 120 MG/0.8ML injection, Inject  0.8 mLs (120 mg total) into the skin every 12 (twelve) hours., Disp: 10 Syringe, Rfl: 2;  Krill Oil 300 MG CAPS, Take 300 mg by mouth daily., Disp: , Rfl:  Multiple Vitamin (MULTIVITAMIN) tablet, Take 1 tablet by mouth daily.  , Disp: , Rfl: ;  nitroGLYCERIN (NITROSTAT) 0.4 MG SL tablet, Place 0.4 mg under the tongue every 5 (five) minutes as needed. For chest pain, Disp: , Rfl: ;  potassium chloride (KLOR-CON) 8 MEQ CR tablet, Take 8 mEq by mouth daily. , Disp: , Rfl: ;  ramipril (ALTACE) 5 MG tablet, Take 5 mg by mouth 2 (two) times daily., Disp: , Rfl:  ranitidine (ZANTAC) 150 MG tablet, Take 150 mg by mouth 2 (two) times daily., Disp: , Rfl: ;  solifenacin (VESICARE) 10 MG tablet, Take 10 mg by mouth daily. AFTERNOON, Disp: , Rfl: ;  Tamsulosin HCl (FLOMAX) 0.4 MG CAPS, Take 0.4 mg by mouth daily. AFTERNOON, Disp: , Rfl: ;  Tetrahyd-Glyc-Hypro-PEG-ZnSulf (VISINE TOTALITY MULTI-SYMPTOM) 0.05 % SOLN, Place 1 drop into both eyes daily as needed. For dry eyes, Disp: , Rfl:  torsemide (DEMADEX) 10 MG tablet, Take 20 mg by mouth daily., Disp: , Rfl: ;  vitamin B-12 (CYANOCOBALAMIN) 1000 MCG tablet, Take 1,000 mcg by mouth daily.  , Disp: ,  Rfl: ;  warfarin (COUMADIN) 5 MG tablet, Take 7.5-10 mg by mouth daily. Take 7.5mg  on mon, wed, and fri, and 10mg  on all other days, Disp: , Rfl:  Facility-Administered Medications Ordered in Other Encounters: ceFAZolin (ANCEF) IVPB 2 g/50 mL premix, 2 g, Intravenous, On Call, D Jeananne Rama, PA;  lactated ringers infusion, , Intravenous, Continuous, D Jeananne Rama, PA   Results for orders placed during the hospital encounter of 06/20/12  PROTIME-INR      Component Value Range   Prothrombin Time 14.1  11.6 - 15.2 seconds   INR 1.10  0.00 - 1.49      06/16/2012    WBC  6.4  HGB  13.1  PLTS  234K     K  4.1   CREAT .87  Review of Systems  Constitutional: Positive for malaise/fatigue. Negative for fever and chills.  Respiratory: Positive for cough and shortness of  breath.   Cardiovascular: Negative for chest pain.  Gastrointestinal: Negative for nausea, vomiting and abdominal pain.  Genitourinary: Negative for hematuria.  Musculoskeletal: Positive for back pain.  Neurological: Negative for headaches.  Endo/Heme/Allergies: Does not bruise/bleed easily.   VITALS: BP  143/84  HR 62  R 21  O2 SATS 93%RA  TEMP 97.9 Physical Exam  Constitutional: He is oriented to person, place, and time. He appears well-developed and well-nourished.  Cardiovascular:       S1, S2 with click, SEM  Respiratory: Effort normal and breath sounds normal.  GI: Soft. Bowel sounds are normal. There is no tenderness.  Musculoskeletal: Normal range of motion. He exhibits edema.  Neurological: He is alert and oriented to person, place, and time.     Assessment/Plan Patient with history of RFA right lower pole RCC 03/2009; now with enlarging right posterior/interpolar renal lesion. Plan is for CT guided cryoablation/possible biopsy of the enlarging rt renal lesion today. Details/risks of the procedure were discussed with the pt/family with their understanding and consent. Following the procedure the pt will be admitted for overnight observation.  Norlene Lanes,D KEVIN 06/20/2012, 9:00 AM

## 2012-06-21 ENCOUNTER — Encounter (HOSPITAL_COMMUNITY): Payer: Self-pay | Admitting: *Deleted

## 2012-06-21 ENCOUNTER — Other Ambulatory Visit: Payer: Self-pay | Admitting: Radiology

## 2012-06-21 DIAGNOSIS — C649 Malignant neoplasm of unspecified kidney, except renal pelvis: Secondary | ICD-10-CM

## 2012-06-21 LAB — CBC
HCT: 36.9 % — ABNORMAL LOW (ref 39.0–52.0)
MCH: 29.5 pg (ref 26.0–34.0)
MCHC: 34.4 g/dL (ref 30.0–36.0)
MCV: 85.6 fL (ref 78.0–100.0)
RDW: 12.6 % (ref 11.5–15.5)

## 2012-06-21 LAB — BASIC METABOLIC PANEL
BUN: 13 mg/dL (ref 6–23)
Creatinine, Ser: 0.88 mg/dL (ref 0.50–1.35)
GFR calc Af Amer: >90 mL/min (ref >90)
GFR calc non Af Amer: 81 mL/min — ABNORMAL LOW (ref >90)

## 2012-06-21 MED ORDER — CARVEDILOL 25 MG PO TABS: 25.0000 mg | ORAL_TABLET | Freq: Two times a day (BID) | ORAL | Status: DC

## 2012-06-21 MED ORDER — DIGOXIN 125 MCG PO TABS: 0.1250 mg | ORAL_TABLET | Freq: Every day | ORAL | Status: DC

## 2012-06-21 MED ORDER — TAMSULOSIN HCL 0.4 MG PO CAPS: 0.4000 mg | ORAL_CAPSULE | Freq: Every day | ORAL | Status: DC

## 2012-06-21 MED ORDER — RAMIPRIL 5 MG PO TABS: 5.0000 mg | ORAL_TABLET | Freq: Two times a day (BID) | ORAL | Status: DC

## 2012-06-21 MED ORDER — POTASSIUM CHLORIDE CRYS ER 10 MEQ PO TBCR: 10.0000 meq | EXTENDED_RELEASE_TABLET | Freq: Every day | ORAL | Status: DC

## 2012-06-21 MED ORDER — AMLODIPINE BESYLATE 5 MG PO TABS: 5.0000 mg | ORAL_TABLET | Freq: Every morning | ORAL | Status: DC

## 2012-06-21 MED ORDER — DARIFENACIN HYDROBROMIDE ER 15 MG PO TB24: 15.0000 mg | ORAL_TABLET | Freq: Every day | ORAL | Status: DC

## 2012-06-21 MED ORDER — NITROGLYCERIN 0.4 MG SL SUBL: 0.4000 mg | SUBLINGUAL_TABLET | SUBLINGUAL | Status: DC | PRN

## 2012-06-21 MED ORDER — ATORVASTATIN CALCIUM 40 MG PO TABS: 40.0000 mg | ORAL_TABLET | Freq: Every day | ORAL | Status: DC

## 2012-06-21 MED ORDER — TORSEMIDE 20 MG PO TABS: 20.0000 mg | ORAL_TABLET | Freq: Every day | ORAL | Status: DC

## 2012-06-21 MED ORDER — FAMOTIDINE 20 MG PO TABS: 20.0000 mg | ORAL_TABLET | Freq: Every day | ORAL | Status: DC

## 2012-06-21 NOTE — Discharge Summary (Signed)
Physician Discharge Summary  Patient ID: [name] MRN: [id number] DOB/AGE: Sep 16, 1933 78 y.o.  Admit date: 06/20/2012 Discharge date: 06/21/2012  Admission Diagnoses: Renal cell carcinoma; Enlarging Rt renal mass  Discharge Diagnoses: Rt renal mass cryoablation 10/25 Active Problems:  * No active hospital problems. *  Renal cell Cancer; Hyperlipidemia; Hypertension; CAD; Afib; Aortic valve replacement; CHF  Discharged Condition: improved; stable  Hospital Course: Renal cell Ca dx 2010; Rt renal mas radiofrequency ablation performed 03/2009 without complication. New renal mass noted on 03/2012 CT and MRI -- enlarging. Rt renal mass cryoablation performed 06/20/12 and pt tolerated well.  Overnight stay was uneventful. Slept well; eating well; urinating and passing gas. No pain; No N/V.  Dr Deanne Coffer aware of pt status and plan for dc today. Pt will restart coumadin in am. Pt to follow coumadin clinic instructions for restart to include Lovenox for few days. Follow with Dr Fredia Sorrow in 1 month at clinic.  Consults: None  Significant Diagnostic Studies: Rt renal CT  Treatments: Rt renal mass cryoablation  Discharge Exam: Blood pressure 149/80, pulse 84, temperature 98.8 F (37.1 C), temperature source Oral, resp. rate 20, height 6\' 4"  (1.93 m), weight 267 lb (121.11 kg), SpO2 94.00%.  PE:  A/O; appropriate Heart: RRR w/o M; +click Lungs: CTA Abd: soft; +BS; NT; No masses Rt flank: site of cryoablation is NT; no bleeding; no hematoma; clean and dry Urine is clear and yellow Ext: FROM  Results for orders placed during the hospital encounter of 06/20/12  CBC      Component Value Range   WBC 8.9  4.0 - 10.5 K/uL   RBC 4.31  4.22 - 5.81 MIL/uL   Hemoglobin 12.7 (*) 13.0 - 17.0 g/dL   HCT 46.9 (*) 62.9 - 52.8 %   MCV 85.6  78.0 - 100.0 fL   MCH 29.5  26.0 - 34.0 pg   MCHC 34.4  30.0 - 36.0 g/dL   RDW 41.3  24.4 - 01.0 %   Platelets 181  150 - 400 K/uL  BASIC METABOLIC PANEL       Component Value Range   Sodium 136  135 - 145 mEq/L   Potassium 3.9  3.5 - 5.1 mEq/L   Chloride 99  96 - 112 mEq/L   CO2 27  19 - 32 mEq/L   Glucose, Bld 107 (*) 70 - 99 mg/dL   BUN 13  6 - 23 mg/dL   Creatinine, Ser 2.72  0.50 - 1.35 mg/dL   Calcium 9.6  8.4 - 53.6 mg/dL   GFR calc non Af Amer 81 (*) >90 mL/min   GFR calc Af Amer >90  >90 mL/min    Disposition: Rt renal mass cryoablation performed in IR 10/25 by Dr Fredia Sorrow Pt tolerated well. Overnight stay was without event. Discussed with Dr Deanne Coffer. DC now to home Pt to follow with Dr Fredia Sorrow at clinic in 1 mo. Tammy will call pt with time and date. Continue all home meds. Follow coumadin clinic instructions for coumadin restart tomorrow. Pt and wife have good understanding of dc instructions and plan.   Discharge Orders    Future Appointments: Provider: Department: Dept Phone: Center:   06/25/2012 10:15 AM [facility] [phone] None     Future Orders Please Complete By Expires   Diet - low sodium heart healthy      Increase activity slowly      Discharge instructions      Comments:   Follow up  in clinic 1 month - Tammy will call pt with time and date ([phone]); restful x 48 hrs; follow coumadin clinic Rx for restart -may restart 10/27   Driving Restrictions      Comments:   No driving x 2-3 days   Lifting restrictions      Comments:   No lifting over 10 lbs x 2-3 days   Discharge wound care:      Comments:   Keep new bandaid at site daily x 5 days   Call MD for:  temperature >100.4      Call MD for:  persistant nausea and vomiting      Call MD for:  severe uncontrolled pain      Call MD for:  redness, tenderness, or signs of infection (pain, swelling, redness, odor or green/yellow discharge around incision site)          Medication List     As of 06/21/2012  8:13 AM    TAKE these medications         acetaminophen 325 MG tablet   Commonly known as: TYLENOL    Take 650 mg by mouth 2 (two) times daily.      amLODipine 5 MG tablet   Commonly known as: NORVASC   Take 5 mg by mouth every morning.      amoxicillin 500 MG capsule   Commonly known as: AMOXIL   Take 500 mg by mouth as needed. Dental      atorvastatin 40 MG tablet   Commonly known as: LIPITOR   Take 40 mg by mouth at bedtime.      carvedilol 25 MG tablet   Commonly known as: COREG   Take 25 mg by mouth 2 (two) times daily with a meal.      digoxin 0.125 MG tablet   Commonly known as: LANOXIN   Take 0.125 mg by mouth at bedtime.      enoxaparin 120 MG/0.8ML injection   Commonly known as: LOVENOX   Inject 0.8 mLs (120 mg total) into the skin every 12 (twelve) hours.      FLOMAX 0.4 MG Caps   Generic drug: Tamsulosin HCl   Take 0.4 mg by mouth daily. AFTERNOON      Krill Oil 300 MG Caps   Take 300 mg by mouth daily.      multivitamin tablet   Take 1 tablet by mouth daily.      nitroGLYCERIN 0.4 MG SL tablet   Commonly known as: NITROSTAT   Place 0.4 mg under the tongue every 5 (five) minutes as needed. For chest pain      potassium chloride 8 MEQ CR tablet   Commonly known as: KLOR-CON   Take 8 mEq by mouth daily.      ramipril 5 MG tablet   Commonly known as: ALTACE   Take 5 mg by mouth 2 (two) times daily.      ranitidine 150 MG tablet   Commonly known as: ZANTAC   Take 150 mg by mouth 2 (two) times daily.      solifenacin 10 MG tablet   Commonly known as: VESICARE   Take 10 mg by mouth daily. AFTERNOON      torsemide 10 MG tablet   Commonly known as: DEMADEX   Take 20 mg by mouth daily.      VISINE TOTALITY MULTI-SYMPTOM 0.05 % Soln   Generic drug: Tetrahyd-Glyc-Hypro-PEG-ZnSulf   Place 1 drop into both eyes daily as needed. For dry  eyes      vitamin B-12 1000 MCG tablet   Commonly known as: CYANOCOBALAMIN   Take 1,000 mcg by mouth daily.      warfarin 5 MG tablet   Commonly known as: COUMADIN   Take 7.5-10 mg by mouth daily. Take 7.5mg  on mon,  wed, and fri, and 10mg  on all other days         Signed: Joylyn Duggin A 06/21/2012, 8:13 AM

## 2012-06-23 NOTE — Discharge Summary (Signed)
Agree 

## 2012-06-25 ENCOUNTER — Other Ambulatory Visit: Payer: Self-pay | Admitting: Interventional Radiology

## 2012-06-25 ENCOUNTER — Ambulatory Visit (INDEPENDENT_AMBULATORY_CARE_PROVIDER_SITE_OTHER): Payer: Medicare Other | Admitting: Pharmacist

## 2012-06-25 DIAGNOSIS — Z954 Presence of other heart-valve replacement: Secondary | ICD-10-CM

## 2012-06-25 DIAGNOSIS — C649 Malignant neoplasm of unspecified kidney, except renal pelvis: Secondary | ICD-10-CM

## 2012-06-25 DIAGNOSIS — I4891 Unspecified atrial fibrillation: Secondary | ICD-10-CM

## 2012-06-25 DIAGNOSIS — Z7901 Long term (current) use of anticoagulants: Secondary | ICD-10-CM

## 2012-06-25 DIAGNOSIS — I359 Nonrheumatic aortic valve disorder, unspecified: Secondary | ICD-10-CM

## 2012-07-02 ENCOUNTER — Ambulatory Visit (INDEPENDENT_AMBULATORY_CARE_PROVIDER_SITE_OTHER): Payer: Medicare Other | Admitting: *Deleted

## 2012-07-02 DIAGNOSIS — Z7901 Long term (current) use of anticoagulants: Secondary | ICD-10-CM

## 2012-07-02 DIAGNOSIS — I4891 Unspecified atrial fibrillation: Secondary | ICD-10-CM

## 2012-07-02 DIAGNOSIS — I359 Nonrheumatic aortic valve disorder, unspecified: Secondary | ICD-10-CM

## 2012-07-02 DIAGNOSIS — Z954 Presence of other heart-valve replacement: Secondary | ICD-10-CM

## 2012-07-10 ENCOUNTER — Other Ambulatory Visit: Payer: Self-pay | Admitting: Emergency Medicine

## 2012-07-10 DIAGNOSIS — C649 Malignant neoplasm of unspecified kidney, except renal pelvis: Secondary | ICD-10-CM

## 2012-07-17 LAB — CREATININE WITH EST GFR
Creat: 1.04 mg/dL (ref 0.50–1.35)
GFR, Est African American: 80 mL/min
GFR, Est Non African American: 69 mL/min

## 2012-07-17 LAB — BUN: BUN: 14 mg/dL (ref 6–23)

## 2012-07-23 ENCOUNTER — Other Ambulatory Visit: Payer: Self-pay | Admitting: Cardiology

## 2012-07-23 ENCOUNTER — Ambulatory Visit (HOSPITAL_COMMUNITY)
Admission: RE | Admit: 2012-07-23 | Discharge: 2012-07-23 | Disposition: A | Discharge status: Home / Self Care | Payer: Medicare Other | Source: Ambulatory Visit | Attending: Interventional Radiology | Admitting: Interventional Radiology

## 2012-07-23 ENCOUNTER — Ambulatory Visit
Admission: RE | Admit: 2012-07-23 | Discharge: 2012-07-23 | Disposition: A | Discharge status: Home / Self Care | Payer: Medicare Other | Source: Ambulatory Visit | Attending: Radiology | Admitting: Radiology

## 2012-07-23 VITALS — BP 129/63 | HR 82 | Temp 98.0°F | Resp 17 | Ht 74.0 in | Wt 265.0 lb

## 2012-07-23 DIAGNOSIS — C649 Malignant neoplasm of unspecified kidney, except renal pelvis: Secondary | ICD-10-CM

## 2012-07-23 DIAGNOSIS — Z09 Encounter for follow-up examination after completed treatment for conditions other than malignant neoplasm: Secondary | ICD-10-CM | POA: Insufficient documentation

## 2012-07-23 MED ORDER — TORSEMIDE 10 MG PO TABS: 20.0000 mg | ORAL_TABLET | Freq: Every day | ORAL | Status: DC

## 2012-07-23 MED ORDER — IOHEXOL 300 MG/ML  SOLN
100.0000 mL | Freq: Once | INTRAMUSCULAR | Status: AC | PRN
  Administered 2012-07-23: 100 mL via INTRAVENOUS

## 2012-07-23 NOTE — Progress Notes (Signed)
Denies hematuria.  No problems w/ urination.  Denies pain associated w/ Cryoablation.  Has resumed normal activities.

## 2012-07-23 NOTE — Progress Notes (Signed)
Patient ID: [name], male   DOB: [birth year], 78 y.o.   MRN: [id number]  ESTABLISHED PATIENT OFFICE VISIT  Chief Complaint: Status post percutaneous radiofrequency ablation of a lower pole right renal carcinoma on 04/04/2009 and more recently percutaneous cryoablation of an enlarging interpolar right renal lesion on 06/20/2012.  History: [name] returns for follow-up and has been doing well. 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 Since the procedure, the patient has no complaints related to the kidney.  Review of Systems: No fever or chills. No abdominal or flank pain. No parasthesias or numbness. No hematuria or dysuria.  Exam: Vital signs: Blood pressure 139/55, pulse 82, respirations 17, temperature 98, oxygen saturation 96% on room air. General: No distress. Abdomen: Soft and nontender. No flank tenderness.  Labs: BUN 14, creatinine 1.04 and estimated GFR 69 ml/minute on 07/16/2012.  Imaging: Follow-up CT performed earlier today demonstrates interval ablation defect of the posterior mid right kidney completely encompassing the treated partially cystic and enhancing neoplasm. There is no evidence of residual enhancement. No signs of complication. The previously ablated lower pole carcinoma shows no evidence of recurrence. No metastatic disease is identified in the abdomen.  Assessment and Plan: [name] is doing quite well after his most recent ablation procedure. He shows no signs of complication and initial appearance appears adequate for ablation of the biopsy proven cystic renal carcinoma. I have recommended further follow- up in late April of next year at which time he will be 6 months post ablation.

## 2012-08-04 ENCOUNTER — Ambulatory Visit (INDEPENDENT_AMBULATORY_CARE_PROVIDER_SITE_OTHER): Payer: Medicare Other

## 2012-08-04 DIAGNOSIS — I359 Nonrheumatic aortic valve disorder, unspecified: Secondary | ICD-10-CM

## 2012-08-04 DIAGNOSIS — Z7901 Long term (current) use of anticoagulants: Secondary | ICD-10-CM

## 2012-08-04 DIAGNOSIS — I4891 Unspecified atrial fibrillation: Secondary | ICD-10-CM

## 2012-08-04 DIAGNOSIS — Z954 Presence of other heart-valve replacement: Secondary | ICD-10-CM

## 2012-08-04 LAB — POCT INR: INR: 2.7

## 2012-08-08 ENCOUNTER — Other Ambulatory Visit: Payer: Self-pay | Admitting: *Deleted

## 2012-08-08 MED ORDER — WARFARIN SODIUM 5 MG PO TABS: 5.0000 mg | ORAL_TABLET | ORAL | Status: DC

## 2012-10-01 ENCOUNTER — Ambulatory Visit (INDEPENDENT_AMBULATORY_CARE_PROVIDER_SITE_OTHER): Payer: Medicare Other | Admitting: *Deleted

## 2012-10-01 DIAGNOSIS — Z7901 Long term (current) use of anticoagulants: Secondary | ICD-10-CM

## 2012-10-01 DIAGNOSIS — I4891 Unspecified atrial fibrillation: Secondary | ICD-10-CM

## 2012-10-01 DIAGNOSIS — I359 Nonrheumatic aortic valve disorder, unspecified: Secondary | ICD-10-CM

## 2012-10-01 DIAGNOSIS — Z954 Presence of other heart-valve replacement: Secondary | ICD-10-CM

## 2012-11-12 ENCOUNTER — Ambulatory Visit (INDEPENDENT_AMBULATORY_CARE_PROVIDER_SITE_OTHER): Payer: Medicare Other | Admitting: *Deleted

## 2012-11-12 DIAGNOSIS — Z7901 Long term (current) use of anticoagulants: Secondary | ICD-10-CM

## 2012-11-12 DIAGNOSIS — I4891 Unspecified atrial fibrillation: Secondary | ICD-10-CM

## 2012-11-12 DIAGNOSIS — Z954 Presence of other heart-valve replacement: Secondary | ICD-10-CM

## 2012-11-12 DIAGNOSIS — I359 Nonrheumatic aortic valve disorder, unspecified: Secondary | ICD-10-CM

## 2012-12-11 ENCOUNTER — Other Ambulatory Visit (HOSPITAL_COMMUNITY): Payer: Self-pay | Admitting: Interventional Radiology

## 2012-12-11 DIAGNOSIS — N2889 Other specified disorders of kidney and ureter: Secondary | ICD-10-CM

## 2012-12-11 DIAGNOSIS — C641 Malignant neoplasm of right kidney, except renal pelvis: Secondary | ICD-10-CM

## 2012-12-22 ENCOUNTER — Other Ambulatory Visit: Payer: Self-pay | Admitting: Radiology

## 2012-12-22 DIAGNOSIS — C641 Malignant neoplasm of right kidney, except renal pelvis: Secondary | ICD-10-CM

## 2012-12-25 ENCOUNTER — Ambulatory Visit (INDEPENDENT_AMBULATORY_CARE_PROVIDER_SITE_OTHER): Payer: Medicare Other | Admitting: *Deleted

## 2012-12-25 DIAGNOSIS — I4891 Unspecified atrial fibrillation: Secondary | ICD-10-CM

## 2012-12-25 DIAGNOSIS — I359 Nonrheumatic aortic valve disorder, unspecified: Secondary | ICD-10-CM

## 2012-12-25 DIAGNOSIS — Z7901 Long term (current) use of anticoagulants: Secondary | ICD-10-CM

## 2012-12-25 DIAGNOSIS — Z954 Presence of other heart-valve replacement: Secondary | ICD-10-CM

## 2012-12-25 LAB — POCT INR: INR: 2.9

## 2013-01-07 ENCOUNTER — Other Ambulatory Visit: Payer: Self-pay | Admitting: Physician Assistant

## 2013-01-08 NOTE — Telephone Encounter (Signed)
..   Requested Prescriptions   Pending Prescriptions Disp Refills  . amLODipine (NORVASC) 5 MG tablet [Pharmacy Med Name: AMLODIPINE BESYLATE 5MG  TABLETS] 30 tablet 0    Sig: TAKE 1 TABLET BY MOUTH EVERY DAY

## 2013-01-13 ENCOUNTER — Ambulatory Visit (HOSPITAL_COMMUNITY)
Admission: RE | Admit: 2013-01-13 | Discharge: 2013-01-13 | Disposition: A | Discharge status: Home / Self Care | Payer: Medicare Other | Source: Ambulatory Visit | Attending: Interventional Radiology | Admitting: Interventional Radiology

## 2013-01-13 DIAGNOSIS — M479 Spondylosis, unspecified: Secondary | ICD-10-CM | POA: Insufficient documentation

## 2013-01-13 DIAGNOSIS — N281 Cyst of kidney, acquired: Secondary | ICD-10-CM | POA: Insufficient documentation

## 2013-01-13 DIAGNOSIS — C641 Malignant neoplasm of right kidney, except renal pelvis: Secondary | ICD-10-CM

## 2013-01-13 DIAGNOSIS — Z09 Encounter for follow-up examination after completed treatment for conditions other than malignant neoplasm: Secondary | ICD-10-CM | POA: Insufficient documentation

## 2013-01-13 MED ORDER — IOHEXOL 300 MG/ML  SOLN
100.0000 mL | Freq: Once | INTRAMUSCULAR | Status: AC | PRN
  Administered 2013-01-13: 100 mL via INTRAVENOUS

## 2013-01-20 ENCOUNTER — Other Ambulatory Visit: Payer: Medicare Other

## 2013-01-22 ENCOUNTER — Ambulatory Visit
Admission: RE | Admit: 2013-01-22 | Discharge: 2013-01-22 | Disposition: A | Discharge status: Home / Self Care | Payer: Medicare Other | Source: Ambulatory Visit | Attending: Interventional Radiology | Admitting: Interventional Radiology

## 2013-01-22 DIAGNOSIS — N2889 Other specified disorders of kidney and ureter: Secondary | ICD-10-CM

## 2013-01-22 NOTE — Progress Notes (Signed)
Denies hematuria or any other urinary problems.  Denies pain associated with procedure.  Overall doing well.  Salvadore Valvano Carmell [place], RN 01/22/2013 11:44 AM

## 2013-01-22 NOTE — Progress Notes (Signed)
Patient ID: [name], male   DOB: Mar 08, 1934, 79 y.o.   MRN: [id number]  ESTABLISHED PATIENT OFFICE VISIT  Chief Complaint: Status post radiofrequency ablation of a right lower pole renal carcinoma on 04/04/2009 and cryoablation of an interpolar cystic right renal carcinoma with clear cell features on 06/20/2012.  History: Mr. Rondeau returns for follow-up. He has been asymptomatic.  Review of Systems: No abdominal or flank pain. No hematuria or dysuria. No fever or chills.  Exam: Vital signs: Blood pressure 118/61, pulse 71, respirations 19, temperature 97.6, oxygen saturation 95% on room air. General: No acute distress. Abdomen: Soft and nontender. No flank tenderness.  Labs: BUN 15, creatinine 1.07 and estimated GFR 67 ml/minute.  Imaging: Follow-up CT was performed on 01/13/2013. This demonstrates a retracting ablation defect along the posterior aspect of the interpolar right kidney at the level of the more recent cryoablation. No abnormal enhancement is seen at the site of ablation. The older ablation defect involving the inferior aspect of the right kidney is stable in appearance and shows no evidence of abnormal enhancement. No new renal lesions are identified.  Assessment and Plan: No evidence of recurrent carcinoma after two previous right renal ablation procedures to treat a lower pole renal carcinoma and interpolar carcinoma. The patient is now 6 months post more recent cryoablation of the interpolar carcinoma. I have recommended a follow-up CT in October.

## 2013-02-05 ENCOUNTER — Ambulatory Visit (INDEPENDENT_AMBULATORY_CARE_PROVIDER_SITE_OTHER): Payer: Medicare Other

## 2013-02-05 DIAGNOSIS — Z7901 Long term (current) use of anticoagulants: Secondary | ICD-10-CM

## 2013-02-05 DIAGNOSIS — I4891 Unspecified atrial fibrillation: Secondary | ICD-10-CM

## 2013-02-05 DIAGNOSIS — Z954 Presence of other heart-valve replacement: Secondary | ICD-10-CM

## 2013-02-05 DIAGNOSIS — I359 Nonrheumatic aortic valve disorder, unspecified: Secondary | ICD-10-CM

## 2013-02-05 LAB — POCT INR: INR: 2.2

## 2013-02-12 ENCOUNTER — Other Ambulatory Visit: Payer: Self-pay | Admitting: Physician Assistant

## 2013-03-02 ENCOUNTER — Other Ambulatory Visit: Payer: Self-pay | Admitting: *Deleted

## 2013-03-02 ENCOUNTER — Telehealth: Payer: Self-pay | Admitting: Cardiovascular Disease

## 2013-03-02 MED ORDER — CARVEDILOL 25 MG PO TABS: 25.0000 mg | ORAL_TABLET | Freq: Two times a day (BID) | ORAL | Status: DC

## 2013-03-02 MED ORDER — WARFARIN SODIUM 5 MG PO TABS: 5.0000 mg | ORAL_TABLET | ORAL | Status: DC

## 2013-03-02 MED ORDER — WARFARIN SODIUM 5 MG PO TABS: ORAL_TABLET | ORAL | Status: DC

## 2013-03-02 NOTE — Telephone Encounter (Signed)
Called pts mobile phone and informed that refill has been done

## 2013-03-02 NOTE — Telephone Encounter (Signed)
New Problem:    Patient called in needing a refill of his warfarin (COUMADIN) 5 MG tablet called in to the pharmacy listed on his profile.

## 2013-03-02 NOTE — Telephone Encounter (Signed)
Pharmacy called wanting pt Coreg. No refills until appointment Fax Received. Refill Completed. Miner Koral Chowoe (R.M.A)

## 2013-03-02 NOTE — Telephone Encounter (Signed)
Patient would also like a call back.

## 2013-03-19 ENCOUNTER — Ambulatory Visit: Payer: Medicare Other | Admitting: Cardiovascular Disease

## 2013-03-23 ENCOUNTER — Telehealth: Payer: Self-pay | Admitting: Cardiovascular Disease

## 2013-03-23 MED ORDER — AMLODIPINE BESYLATE 5 MG PO TABS: ORAL_TABLET | ORAL | Status: DC

## 2013-03-23 NOTE — Telephone Encounter (Signed)
Patient had to change appt from July to August. Needs Amlodipine refilled to last him until upcoming OV.  Rx called to [facility] in [place]. Completed.

## 2013-03-23 NOTE — Telephone Encounter (Signed)
New Prob      Requesting a new prescription AMLODIPINE to [facility] in [place].

## 2013-04-04 ENCOUNTER — Other Ambulatory Visit: Payer: Self-pay | Admitting: Internal Medicine

## 2013-04-06 ENCOUNTER — Other Ambulatory Visit: Payer: Self-pay | Admitting: *Deleted

## 2013-04-06 MED ORDER — AMLODIPINE BESYLATE 5 MG PO TABS: ORAL_TABLET | ORAL | Status: DC

## 2013-04-06 MED ORDER — CARVEDILOL 25 MG PO TABS: 25.0000 mg | ORAL_TABLET | Freq: Two times a day (BID) | ORAL | Status: DC

## 2013-04-21 ENCOUNTER — Encounter: Payer: Self-pay | Admitting: Cardiovascular Disease

## 2013-04-21 ENCOUNTER — Ambulatory Visit (INDEPENDENT_AMBULATORY_CARE_PROVIDER_SITE_OTHER): Payer: Medicare Other | Admitting: Cardiovascular Disease

## 2013-04-21 VITALS — BP 161/65 | HR 72 | Wt 241.0 lb

## 2013-04-21 DIAGNOSIS — Z954 Presence of other heart-valve replacement: Secondary | ICD-10-CM

## 2013-04-21 DIAGNOSIS — R0989 Other specified symptoms and signs involving the circulatory and respiratory systems: Secondary | ICD-10-CM | POA: Insufficient documentation

## 2013-04-21 DIAGNOSIS — Z952 Presence of prosthetic heart valve: Secondary | ICD-10-CM

## 2013-04-21 DIAGNOSIS — I2510 Atherosclerotic heart disease of native coronary artery without angina pectoris: Secondary | ICD-10-CM

## 2013-04-21 MED ORDER — NITROGLYCERIN 0.4 MG SL SUBL: 0.4000 mg | SUBLINGUAL_TABLET | SUBLINGUAL | Status: DC | PRN

## 2013-04-21 NOTE — Assessment & Plan Note (Signed)
Well controlled.  Continue current medications and low sodium Dash type diet.    

## 2013-04-21 NOTE — Assessment & Plan Note (Signed)
Good rate control and anticoagulation INR checked every 6 weeks

## 2013-04-21 NOTE — Assessment & Plan Note (Signed)
Been over 3 years since echo Will check mechanical sounds normal with no obvious AR  SBE prophylaxis

## 2013-04-21 NOTE — Patient Instructions (Addendum)
Your physician wants you to follow-up in:   6  MONTHS WITH  DR NISHAN  You will receive a reminder letter in the mail two months in advance. If you don't receive a letter, please call our office to schedule the follow-up appointment. Your physician recommends that you continue on your current medications as directed. Please refer to the Current Medication list given to you today. Your physician has requested that you have a carotid duplex. This test is an ultrasound of the carotid arteries in your neck. It looks at blood flow through these arteries that supply the brain with blood. Allow one hour for this exam. There are no restrictions or special instructions. Your physician has requested that you have an echocardiogram. Echocardiography is a painless test that uses sound waves to create images of your heart. It provides your doctor with information about the size and shape of your heart and how well your heart's chambers and valves are working. This procedure takes approximately one hour. There are no restrictions for this procedure.  

## 2013-04-21 NOTE — Assessment & Plan Note (Signed)
Cholesterol is at goal.  Continue current dose of statin and diet Rx.  No myalgias or side effects.  F/U  LFT's in 6 months. No results found for this basename: LDLCALC             

## 2013-04-21 NOTE — Assessment & Plan Note (Signed)
Stable with no angina and good activity level.  Continue medical Rx Nitro called in 

## 2013-04-21 NOTE — Assessment & Plan Note (Signed)
Vs referred murmur from AVR  F/U carotid duplex Patients brother died of a CVA

## 2013-04-21 NOTE — Progress Notes (Signed)
Patient ID: [name], male   DOB: Jan 03, 1934, 79 y.o.   MRN: [id number] He has a history of CAD, Aortic stenosis, status post CABG and [employer] aortic valve replacement 05/2002, permanent atrial fibrillation, hypertension, hyperlipidemia. Last nuclear study 04/2010: Low-risk study with mild fixed basal to mid inferior perfusion defect defect representing diaphragmatic attenuation versus prior infarct, no ischemia. Last echo 04/2010: Mild LVH, EF 55%, diastolic dysfunction indeterminate, mechanical aortic valve prosthesis, no significant AI, mean gradient 18, borderline dilated aortic root, moderate LAE, mild RAE, PASP 30.   Memory is better weight is down and he looks great.  Needs nitro.    ROS: Denies fever, malais, weight loss, blurry vision, decreased visual acuity, cough, sputum, SOB, hemoptysis, pleuritic pain, palpitaitons, heartburn, abdominal pain, melena, lower extremity edema, claudication, or rash.  All other systems reviewed and negative  General: Affect appropriate Healthy:  appears stated age HEENT: normal Neck supple with no adenopathy JVP normal referred murmur /bruits no thyromegaly Lungs clear with no wheezing and good diaphragmatic motion Heart:  S1/S2click SEM  murmur, no rub, gallop or click PMI normal Abdomen: benighn, BS positve, no tenderness, no AAA no bruit.  No HSM or HJR Distal pulses intact with no bruits No edema Neuro non-focal Skin warm and dry No muscular weakness   Current Outpatient Prescriptions  Medication Sig Dispense Refill  . amLODipine (NORVASC) 5 MG tablet TAKE 1 TABLET BY MOUTH EVERY DAY, NEED OV FOR FURTHER REFILLS  30 tablet  0  . amoxicillin (AMOXIL) 500 MG capsule Take 500 mg by mouth as needed. Dental      . atorvastatin (LIPITOR) 40 MG tablet Take 40 mg by mouth at bedtime.      . carvedilol (COREG) 25 MG tablet Take 1 tablet (25 mg total) by mouth 2 (two) times daily with a meal.  60 tablet  0  . DIGOX 125 MCG tablet TAKE 1 TABLET  BY MOUTH EVERY DAY  90 tablet  0  . Multiple Vitamin (MULTIVITAMIN) tablet Take 1 tablet by mouth daily.        . nitroGLYCERIN (NITROSTAT) 0.4 MG SL tablet Place 0.4 mg under the tongue every 5 (five) minutes as needed. For chest pain      . potassium chloride (KLOR-CON) 8 MEQ CR tablet Take 8 mEq by mouth daily.       . ramipril (ALTACE) 5 MG tablet Take 5 mg by mouth 2 (two) times daily.      . ranitidine (ZANTAC) 150 MG tablet Take 150 mg by mouth 2 (two) times daily.      . solifenacin (VESICARE) 10 MG tablet Take 10 mg by mouth daily. AFTERNOON      . Tamsulosin HCl (FLOMAX) 0.4 MG CAPS Take 0.4 mg by mouth daily. AFTERNOON      . Tetrahyd-Glyc-Hypro-PEG-ZnSulf (VISINE TOTALITY MULTI-SYMPTOM) 0.05 % SOLN Place 1 drop into both eyes daily as needed. For dry eyes      . torsemide (DEMADEX) 10 MG tablet Take 2 tablets (20 mg total) by mouth daily.  60 tablet  4  . vitamin B-12 (CYANOCOBALAMIN) 1000 MCG tablet Take 1,000 mcg by mouth daily.        [name] warfarin (COUMADIN) 5 MG tablet Take as directed by coumadin clinic  180 tablet  1  . acetaminophen (TYLENOL) 325 MG tablet Take 650 mg by mouth 2 (two) times daily.        No current facility-administered medications for this visit.    Allergies  Review of patient's allergies indicates no known allergies.  Electrocardiogram:  afib rate 72 nonspecific ST/T wave changes  Assessment and Plan

## 2013-04-29 ENCOUNTER — Encounter (INDEPENDENT_AMBULATORY_CARE_PROVIDER_SITE_OTHER): Payer: Medicare Other

## 2013-04-29 ENCOUNTER — Ambulatory Visit (HOSPITAL_COMMUNITY): Payer: Medicare Other | Attending: Cardiovascular Disease | Admitting: Radiology

## 2013-04-29 ENCOUNTER — Ambulatory Visit (INDEPENDENT_AMBULATORY_CARE_PROVIDER_SITE_OTHER): Payer: Medicare Other | Admitting: *Deleted

## 2013-04-29 DIAGNOSIS — I359 Nonrheumatic aortic valve disorder, unspecified: Secondary | ICD-10-CM | POA: Insufficient documentation

## 2013-04-29 DIAGNOSIS — E785 Hyperlipidemia, unspecified: Secondary | ICD-10-CM | POA: Insufficient documentation

## 2013-04-29 DIAGNOSIS — I4891 Unspecified atrial fibrillation: Secondary | ICD-10-CM

## 2013-04-29 DIAGNOSIS — Z954 Presence of other heart-valve replacement: Secondary | ICD-10-CM | POA: Insufficient documentation

## 2013-04-29 DIAGNOSIS — I6529 Occlusion and stenosis of unspecified carotid artery: Secondary | ICD-10-CM

## 2013-04-29 DIAGNOSIS — R0989 Other specified symptoms and signs involving the circulatory and respiratory systems: Secondary | ICD-10-CM

## 2013-04-29 DIAGNOSIS — I10 Essential (primary) hypertension: Secondary | ICD-10-CM | POA: Insufficient documentation

## 2013-04-29 DIAGNOSIS — Z952 Presence of prosthetic heart valve: Secondary | ICD-10-CM

## 2013-04-29 DIAGNOSIS — I2510 Atherosclerotic heart disease of native coronary artery without angina pectoris: Secondary | ICD-10-CM | POA: Insufficient documentation

## 2013-04-29 DIAGNOSIS — I379 Nonrheumatic pulmonary valve disorder, unspecified: Secondary | ICD-10-CM | POA: Insufficient documentation

## 2013-04-29 DIAGNOSIS — Z7901 Long term (current) use of anticoagulants: Secondary | ICD-10-CM

## 2013-04-29 DIAGNOSIS — I079 Rheumatic tricuspid valve disease, unspecified: Secondary | ICD-10-CM | POA: Insufficient documentation

## 2013-04-29 NOTE — Progress Notes (Signed)
Echocardiogram performed.  

## 2013-05-04 ENCOUNTER — Telehealth: Payer: Self-pay | Admitting: Cardiovascular Disease

## 2013-05-04 NOTE — Telephone Encounter (Signed)
OLD MESSAGE ./CY 

## 2013-05-04 NOTE — Telephone Encounter (Signed)
New Problem ° °Pt returning call.  °

## 2013-05-15 ENCOUNTER — Other Ambulatory Visit: Payer: Self-pay | Admitting: Cardiovascular Disease

## 2013-05-18 ENCOUNTER — Other Ambulatory Visit: Payer: Self-pay | Admitting: Internal Medicine

## 2013-05-18 ENCOUNTER — Other Ambulatory Visit: Payer: Self-pay | Admitting: Cardiovascular Disease

## 2013-05-20 ENCOUNTER — Other Ambulatory Visit (HOSPITAL_COMMUNITY): Payer: Self-pay | Admitting: Interventional Radiology

## 2013-05-20 DIAGNOSIS — C641 Malignant neoplasm of right kidney, except renal pelvis: Secondary | ICD-10-CM

## 2013-05-21 ENCOUNTER — Other Ambulatory Visit: Payer: Self-pay | Admitting: Emergency Medicine

## 2013-05-21 DIAGNOSIS — C641 Malignant neoplasm of right kidney, except renal pelvis: Secondary | ICD-10-CM

## 2013-06-03 LAB — CREATININE WITH EST GFR: Creat: 0.84 mg/dL (ref 0.50–1.35)

## 2013-06-03 LAB — BUN: BUN: 10 mg/dL (ref 6–23)

## 2013-06-09 ENCOUNTER — Other Ambulatory Visit: Payer: Self-pay | Admitting: Cardiovascular Disease

## 2013-06-10 ENCOUNTER — Ambulatory Visit (INDEPENDENT_AMBULATORY_CARE_PROVIDER_SITE_OTHER): Payer: Medicare Other | Admitting: Pharmacist

## 2013-06-10 DIAGNOSIS — Z954 Presence of other heart-valve replacement: Secondary | ICD-10-CM

## 2013-06-10 DIAGNOSIS — I359 Nonrheumatic aortic valve disorder, unspecified: Secondary | ICD-10-CM

## 2013-06-10 DIAGNOSIS — I4891 Unspecified atrial fibrillation: Secondary | ICD-10-CM

## 2013-06-10 DIAGNOSIS — Z7901 Long term (current) use of anticoagulants: Secondary | ICD-10-CM

## 2013-06-10 LAB — POCT INR: INR: 3.4

## 2013-06-16 ENCOUNTER — Ambulatory Visit
Admission: RE | Admit: 2013-06-16 | Discharge: 2013-06-16 | Disposition: A | Discharge status: Home / Self Care | Payer: Medicare Other | Source: Ambulatory Visit | Attending: Interventional Radiology | Admitting: Interventional Radiology

## 2013-06-16 ENCOUNTER — Ambulatory Visit (HOSPITAL_COMMUNITY)
Admission: RE | Admit: 2013-06-16 | Discharge: 2013-06-16 | Disposition: A | Discharge status: Home / Self Care | Payer: Medicare Other | Source: Ambulatory Visit | Attending: Interventional Radiology | Admitting: Interventional Radiology

## 2013-06-16 DIAGNOSIS — C641 Malignant neoplasm of right kidney, except renal pelvis: Secondary | ICD-10-CM

## 2013-06-16 DIAGNOSIS — I700 Atherosclerosis of aorta: Secondary | ICD-10-CM | POA: Insufficient documentation

## 2013-06-16 DIAGNOSIS — N289 Disorder of kidney and ureter, unspecified: Secondary | ICD-10-CM | POA: Insufficient documentation

## 2013-06-16 DIAGNOSIS — N281 Cyst of kidney, acquired: Secondary | ICD-10-CM | POA: Insufficient documentation

## 2013-06-16 DIAGNOSIS — Z09 Encounter for follow-up examination after completed treatment for conditions other than malignant neoplasm: Secondary | ICD-10-CM | POA: Insufficient documentation

## 2013-06-16 MED ORDER — IOHEXOL 300 MG/ML  SOLN
100.0000 mL | Freq: Once | INTRAMUSCULAR | Status: AC | PRN
  Administered 2013-06-16: 100 mL via INTRAVENOUS

## 2013-06-16 NOTE — Progress Notes (Signed)
Denies hematuria or other problems with urination.  Denies discomfort associated with cryoablation.  Weight loss of 40 lbs due to dietary changes.  Overall doing well.  Sheryl Towell Carmell [place], [place] 06/16/2013 10:23 AM

## 2013-06-24 ENCOUNTER — Other Ambulatory Visit: Payer: Self-pay | Admitting: Cardiovascular Disease

## 2013-06-24 ENCOUNTER — Ambulatory Visit (INDEPENDENT_AMBULATORY_CARE_PROVIDER_SITE_OTHER): Payer: Medicare Other | Admitting: Pharmacist

## 2013-06-24 DIAGNOSIS — I4891 Unspecified atrial fibrillation: Secondary | ICD-10-CM

## 2013-06-24 DIAGNOSIS — Z954 Presence of other heart-valve replacement: Secondary | ICD-10-CM

## 2013-06-24 DIAGNOSIS — Z7901 Long term (current) use of anticoagulants: Secondary | ICD-10-CM

## 2013-06-24 DIAGNOSIS — I359 Nonrheumatic aortic valve disorder, unspecified: Secondary | ICD-10-CM

## 2013-06-24 LAB — POCT INR: INR: 2.8

## 2013-07-17 ENCOUNTER — Ambulatory Visit (INDEPENDENT_AMBULATORY_CARE_PROVIDER_SITE_OTHER): Payer: Medicare Other | Admitting: General Practice

## 2013-07-17 DIAGNOSIS — I359 Nonrheumatic aortic valve disorder, unspecified: Secondary | ICD-10-CM

## 2013-07-17 DIAGNOSIS — Z954 Presence of other heart-valve replacement: Secondary | ICD-10-CM

## 2013-07-17 DIAGNOSIS — Z7901 Long term (current) use of anticoagulants: Secondary | ICD-10-CM

## 2013-07-17 DIAGNOSIS — I4891 Unspecified atrial fibrillation: Secondary | ICD-10-CM

## 2013-08-10 ENCOUNTER — Ambulatory Visit (INDEPENDENT_AMBULATORY_CARE_PROVIDER_SITE_OTHER): Payer: Medicare Other | Admitting: General Practice

## 2013-08-10 DIAGNOSIS — I359 Nonrheumatic aortic valve disorder, unspecified: Secondary | ICD-10-CM

## 2013-08-10 DIAGNOSIS — Z7901 Long term (current) use of anticoagulants: Secondary | ICD-10-CM

## 2013-08-10 DIAGNOSIS — Z954 Presence of other heart-valve replacement: Secondary | ICD-10-CM

## 2013-08-10 DIAGNOSIS — I4891 Unspecified atrial fibrillation: Secondary | ICD-10-CM

## 2013-08-10 LAB — POCT INR: INR: 2.3

## 2013-08-17 ENCOUNTER — Other Ambulatory Visit: Payer: Self-pay | Admitting: Internal Medicine

## 2013-08-31 ENCOUNTER — Other Ambulatory Visit: Payer: Self-pay

## 2013-08-31 MED ORDER — DIGOXIN 125 MCG PO TABS: ORAL_TABLET | ORAL | Status: DC

## 2013-09-01 ENCOUNTER — Emergency Department (HOSPITAL_COMMUNITY): Payer: Medicare Other

## 2013-09-01 ENCOUNTER — Inpatient Hospital Stay (HOSPITAL_COMMUNITY): Payer: Medicare Other

## 2013-09-01 ENCOUNTER — Encounter (HOSPITAL_COMMUNITY): Payer: Self-pay | Admitting: Emergency Medicine

## 2013-09-01 ENCOUNTER — Inpatient Hospital Stay (HOSPITAL_COMMUNITY)
Admission: EM | Admit: 2013-09-01 | Discharge: 2013-09-10 | DRG: 417 | Disposition: A | Discharge status: Home Health | Payer: Medicare Other | Attending: Internal Medicine | Admitting: Internal Medicine

## 2013-09-01 DIAGNOSIS — R609 Edema, unspecified: Secondary | ICD-10-CM

## 2013-09-01 DIAGNOSIS — E871 Hypo-osmolality and hyponatremia: Secondary | ICD-10-CM

## 2013-09-01 DIAGNOSIS — Z85528 Personal history of other malignant neoplasm of kidney: Secondary | ICD-10-CM

## 2013-09-01 DIAGNOSIS — K8010 Calculus of gallbladder with chronic cholecystitis without obstruction: Principal | ICD-10-CM | POA: Diagnosis present

## 2013-09-01 DIAGNOSIS — I2582 Chronic total occlusion of coronary artery: Secondary | ICD-10-CM | POA: Diagnosis present

## 2013-09-01 DIAGNOSIS — I5032 Chronic diastolic (congestive) heart failure: Secondary | ICD-10-CM | POA: Diagnosis present

## 2013-09-01 DIAGNOSIS — D72829 Elevated white blood cell count, unspecified: Secondary | ICD-10-CM | POA: Diagnosis present

## 2013-09-01 DIAGNOSIS — R4182 Altered mental status, unspecified: Secondary | ICD-10-CM

## 2013-09-01 DIAGNOSIS — D62 Acute posthemorrhagic anemia: Secondary | ICD-10-CM | POA: Diagnosis not present

## 2013-09-01 DIAGNOSIS — R109 Unspecified abdominal pain: Secondary | ICD-10-CM | POA: Diagnosis present

## 2013-09-01 DIAGNOSIS — K7689 Other specified diseases of liver: Secondary | ICD-10-CM | POA: Diagnosis present

## 2013-09-01 DIAGNOSIS — Z952 Presence of prosthetic heart valve: Secondary | ICD-10-CM

## 2013-09-01 DIAGNOSIS — Z79899 Other long term (current) drug therapy: Secondary | ICD-10-CM

## 2013-09-01 DIAGNOSIS — D638 Anemia in other chronic diseases classified elsewhere: Secondary | ICD-10-CM | POA: Diagnosis present

## 2013-09-01 DIAGNOSIS — K819 Cholecystitis, unspecified: Secondary | ICD-10-CM

## 2013-09-01 DIAGNOSIS — R740 Nonspecific elevation of levels of transaminase and lactic acid dehydrogenase [LDH]: Secondary | ICD-10-CM

## 2013-09-01 DIAGNOSIS — K219 Gastro-esophageal reflux disease without esophagitis: Secondary | ICD-10-CM | POA: Diagnosis present

## 2013-09-01 DIAGNOSIS — E785 Hyperlipidemia, unspecified: Secondary | ICD-10-CM | POA: Diagnosis present

## 2013-09-01 DIAGNOSIS — R778 Other specified abnormalities of plasma proteins: Secondary | ICD-10-CM

## 2013-09-01 DIAGNOSIS — Z87891 Personal history of nicotine dependence: Secondary | ICD-10-CM

## 2013-09-01 DIAGNOSIS — D649 Anemia, unspecified: Secondary | ICD-10-CM

## 2013-09-01 DIAGNOSIS — J4489 Other specified chronic obstructive pulmonary disease: Secondary | ICD-10-CM | POA: Diagnosis present

## 2013-09-01 DIAGNOSIS — Z7901 Long term (current) use of anticoagulants: Secondary | ICD-10-CM

## 2013-09-01 DIAGNOSIS — I959 Hypotension, unspecified: Secondary | ICD-10-CM

## 2013-09-01 DIAGNOSIS — C649 Malignant neoplasm of unspecified kidney, except renal pelvis: Secondary | ICD-10-CM | POA: Diagnosis present

## 2013-09-01 DIAGNOSIS — Z951 Presence of aortocoronary bypass graft: Secondary | ICD-10-CM

## 2013-09-01 DIAGNOSIS — R7401 Elevation of levels of liver transaminase levels: Secondary | ICD-10-CM | POA: Diagnosis present

## 2013-09-01 DIAGNOSIS — Z833 Family history of diabetes mellitus: Secondary | ICD-10-CM

## 2013-09-01 DIAGNOSIS — R0989 Other specified symptoms and signs involving the circulatory and respiratory systems: Secondary | ICD-10-CM

## 2013-09-01 DIAGNOSIS — I2510 Atherosclerotic heart disease of native coronary artery without angina pectoris: Secondary | ICD-10-CM | POA: Diagnosis present

## 2013-09-01 DIAGNOSIS — R7402 Elevation of levels of lactic acid dehydrogenase (LDH): Secondary | ICD-10-CM | POA: Diagnosis present

## 2013-09-01 DIAGNOSIS — I10 Essential (primary) hypertension: Secondary | ICD-10-CM | POA: Diagnosis present

## 2013-09-01 DIAGNOSIS — I214 Non-ST elevation (NSTEMI) myocardial infarction: Secondary | ICD-10-CM | POA: Diagnosis present

## 2013-09-01 DIAGNOSIS — J449 Chronic obstructive pulmonary disease, unspecified: Secondary | ICD-10-CM | POA: Diagnosis present

## 2013-09-01 DIAGNOSIS — M129 Arthropathy, unspecified: Secondary | ICD-10-CM | POA: Diagnosis present

## 2013-09-01 DIAGNOSIS — I4891 Unspecified atrial fibrillation: Secondary | ICD-10-CM | POA: Diagnosis present

## 2013-09-01 DIAGNOSIS — N400 Enlarged prostate without lower urinary tract symptoms: Secondary | ICD-10-CM | POA: Diagnosis present

## 2013-09-01 DIAGNOSIS — Z8249 Family history of ischemic heart disease and other diseases of the circulatory system: Secondary | ICD-10-CM

## 2013-09-01 DIAGNOSIS — E876 Hypokalemia: Secondary | ICD-10-CM

## 2013-09-01 DIAGNOSIS — R0602 Shortness of breath: Secondary | ICD-10-CM

## 2013-09-01 DIAGNOSIS — I359 Nonrheumatic aortic valve disorder, unspecified: Secondary | ICD-10-CM

## 2013-09-01 DIAGNOSIS — R7989 Other specified abnormal findings of blood chemistry: Secondary | ICD-10-CM

## 2013-09-01 DIAGNOSIS — Z954 Presence of other heart-valve replacement: Secondary | ICD-10-CM

## 2013-09-01 DIAGNOSIS — K5900 Constipation, unspecified: Secondary | ICD-10-CM

## 2013-09-01 DIAGNOSIS — R079 Chest pain, unspecified: Secondary | ICD-10-CM

## 2013-09-01 HISTORY — DX: Presence of prosthetic heart valve: Z95.2

## 2013-09-01 HISTORY — DX: Constipation, unspecified: K59.00

## 2013-09-01 HISTORY — DX: Gastric ulcer, unspecified as acute or chronic, without hemorrhage or perforation: K25.9

## 2013-09-01 HISTORY — DX: Benign prostatic hyperplasia without lower urinary tract symptoms: N40.0

## 2013-09-01 LAB — URINALYSIS, ROUTINE W REFLEX MICROSCOPIC
GLUCOSE, UA: NEGATIVE mg/dL
Ketones, ur: NEGATIVE mg/dL
LEUKOCYTES UA: NEGATIVE
NITRITE: NEGATIVE
PH: 6 (ref 5.0–8.0)
PROTEIN: 100 mg/dL — AB
Specific Gravity, Urine: 1.025 (ref 1.005–1.030)
Urobilinogen, UA: 0.2 mg/dL (ref 0.0–1.0)

## 2013-09-01 LAB — TROPONIN I
TROPONIN I: 0.47 ng/mL — AB (ref <0.30)
TROPONIN I: 0.46 ng/mL — AB (ref <0.30)
Troponin I: 0.49 ng/mL (ref <0.30)
Troponin I: 0.44 ng/mL (ref <0.30)

## 2013-09-01 LAB — COMPREHENSIVE METABOLIC PANEL
ALBUMIN: 3.2 g/dL — AB (ref 3.5–5.2)
ALT: 36 U/L (ref 0–53)
AST: 75 U/L — ABNORMAL HIGH (ref 0–37)
Alkaline Phosphatase: 90 U/L (ref 39–117)
BUN: 18 mg/dL (ref 6–23)
CO2: 26 mEq/L (ref 19–32)
Calcium: 9.2 mg/dL (ref 8.4–10.5)
Chloride: 92 mEq/L — ABNORMAL LOW (ref 96–112)
Creatinine, Ser: 0.9 mg/dL (ref 0.50–1.35)
GFR calc Af Amer: >90 mL/min (ref >90)
GFR calc non Af Amer: 80 mL/min — ABNORMAL LOW (ref >90)
Glucose, Bld: 142 mg/dL — ABNORMAL HIGH (ref 70–99)
POTASSIUM: 4.1 meq/L (ref 3.7–5.3)
SODIUM: 131 meq/L — AB (ref 137–147)
TOTAL PROTEIN: 6.3 g/dL (ref 6.0–8.3)
Total Bilirubin: 1.2 mg/dL (ref 0.3–1.2)

## 2013-09-01 LAB — CBC WITH DIFFERENTIAL/PLATELET
BASOS PCT: 0 % (ref 0–1)
Basophils Absolute: 0 10*3/uL (ref 0.0–0.1)
EOS ABS: 0 10*3/uL (ref 0.0–0.7)
Eosinophils Relative: 0 % (ref 0–5)
HCT: 33.8 % — ABNORMAL LOW (ref 39.0–52.0)
Hemoglobin: 12.2 g/dL — ABNORMAL LOW (ref 13.0–17.0)
Lymphocytes Relative: 8 % — ABNORMAL LOW (ref 12–46)
Lymphs Abs: 0.9 10*3/uL (ref 0.7–4.0)
MCH: 29.7 pg (ref 26.0–34.0)
MCHC: 36.1 g/dL — AB (ref 30.0–36.0)
MCV: 82.2 fL (ref 78.0–100.0)
Monocytes Absolute: 1.6 10*3/uL — ABNORMAL HIGH (ref 0.1–1.0)
Monocytes Relative: 13 % — ABNORMAL HIGH (ref 3–12)
Neutro Abs: 9.2 10*3/uL — ABNORMAL HIGH (ref 1.7–7.7)
Neutrophils Relative %: 79 % — ABNORMAL HIGH (ref 43–77)
PLATELETS: 152 10*3/uL (ref 150–400)
RBC: 4.11 MIL/uL — AB (ref 4.22–5.81)
RDW: 12.3 % (ref 11.5–15.5)
WBC: 11.7 10*3/uL — ABNORMAL HIGH (ref 4.0–10.5)

## 2013-09-01 LAB — DIGOXIN LEVEL: Digoxin Level: <0.3 ng/mL — ABNORMAL LOW (ref 0.8–2.0)

## 2013-09-01 LAB — HEMOGLOBIN A1C
HEMOGLOBIN A1C: 5.8 % — AB (ref <5.7)
MEAN PLASMA GLUCOSE: 120 mg/dL — AB (ref <117)

## 2013-09-01 LAB — INFLUENZA PANEL BY PCR (TYPE A & B)
H1N1 flu by pcr: NOT DETECTED
INFLAPCR: NEGATIVE
Influenza B By PCR: NEGATIVE

## 2013-09-01 LAB — CG4 I-STAT (LACTIC ACID): Lactic Acid, Venous: 0.87 mmol/L (ref 0.5–2.2)

## 2013-09-01 LAB — URINE MICROSCOPIC-ADD ON

## 2013-09-01 LAB — PROTIME-INR
INR: 2.83 — AB (ref 0.00–1.49)
Prothrombin Time: 28.8 seconds — ABNORMAL HIGH (ref 11.6–15.2)

## 2013-09-01 LAB — TSH: TSH: 0.875 u[IU]/mL (ref 0.350–4.500)

## 2013-09-01 LAB — LIPASE, BLOOD: Lipase: 17 U/L (ref 11–59)

## 2013-09-01 MED ORDER — DIGOXIN 125 MCG PO TABS
0.1250 mg | ORAL_TABLET | Freq: Every day | ORAL | Status: DC
  Administered 2013-09-02 – 2013-09-10 (×8): 0.125 mg via ORAL
  Filled 2013-09-01 (×9): qty 1

## 2013-09-01 MED ORDER — SODIUM CHLORIDE 0.9 % IV BOLUS (SEPSIS)
500.0000 mL | Freq: Once | INTRAVENOUS | Status: AC
  Administered 2013-09-01: 500 mL via INTRAVENOUS

## 2013-09-01 MED ORDER — ASPIRIN 81 MG PO CHEW
324.0000 mg | CHEWABLE_TABLET | Freq: Once | ORAL | Status: AC
  Administered 2013-09-01: 324 mg via ORAL
  Filled 2013-09-01: qty 4

## 2013-09-01 MED ORDER — PANTOPRAZOLE SODIUM 40 MG IV SOLR
40.0000 mg | Freq: Two times a day (BID) | INTRAVENOUS | Status: DC
  Administered 2013-09-01 – 2013-09-10 (×17): 40 mg via INTRAVENOUS
  Filled 2013-09-01 (×19): qty 40

## 2013-09-01 MED ORDER — ALUM & MAG HYDROXIDE-SIMETH 200-200-20 MG/5ML PO SUSP: 30.0000 mL | Freq: Four times a day (QID) | ORAL | Status: DC | PRN

## 2013-09-01 MED ORDER — ACETAMINOPHEN 325 MG PO TABS: 650.0000 mg | ORAL_TABLET | Freq: Four times a day (QID) | ORAL | Status: DC | PRN

## 2013-09-01 MED ORDER — SENNA 8.6 MG PO TABS
1.0000 | ORAL_TABLET | Freq: Two times a day (BID) | ORAL | Status: DC
  Administered 2013-09-01 – 2013-09-10 (×16): 8.6 mg via ORAL
  Filled 2013-09-01 (×16): qty 1

## 2013-09-01 MED ORDER — DOCUSATE SODIUM 100 MG PO CAPS
100.0000 mg | ORAL_CAPSULE | Freq: Two times a day (BID) | ORAL | Status: DC
  Administered 2013-09-01 – 2013-09-10 (×16): 100 mg via ORAL
  Filled 2013-09-01 (×20): qty 1

## 2013-09-01 MED ORDER — HYDROCODONE-ACETAMINOPHEN 5-325 MG PO TABS
1.0000 | ORAL_TABLET | ORAL | Status: DC | PRN
  Administered 2013-09-08 – 2013-09-09 (×2): 1 via ORAL
  Administered 2013-09-09: 2 via ORAL
  Filled 2013-09-01: qty 1
  Filled 2013-09-01: qty 2
  Filled 2013-09-01: qty 1

## 2013-09-01 MED ORDER — VITAMIN B-12 1000 MCG PO TABS
1000.0000 ug | ORAL_TABLET | Freq: Every day | ORAL | Status: DC
  Administered 2013-09-01 – 2013-09-10 (×9): 1000 ug via ORAL
  Filled 2013-09-01 (×10): qty 1

## 2013-09-01 MED ORDER — CARVEDILOL 12.5 MG PO TABS
12.5000 mg | ORAL_TABLET | Freq: Two times a day (BID) | ORAL | Status: DC
  Administered 2013-09-01 – 2013-09-02 (×3): 12.5 mg via ORAL
  Filled 2013-09-01 (×6): qty 1

## 2013-09-01 MED ORDER — IOHEXOL 300 MG/ML  SOLN
100.0000 mL | Freq: Once | INTRAMUSCULAR | Status: AC | PRN
  Administered 2013-09-01: 19:00:00 100 mL via INTRAVENOUS

## 2013-09-01 MED ORDER — POTASSIUM CHLORIDE CRYS ER 10 MEQ PO TBCR
10.0000 meq | EXTENDED_RELEASE_TABLET | Freq: Every day | ORAL | Status: DC
  Administered 2013-09-01 – 2013-09-10 (×9): 10 meq via ORAL
  Filled 2013-09-01 (×10): qty 1

## 2013-09-01 MED ORDER — SODIUM CHLORIDE 0.9 % IJ SOLN
3.0000 mL | Freq: Two times a day (BID) | INTRAMUSCULAR | Status: DC
  Administered 2013-09-05: 23:00:00 3 mL via INTRAVENOUS

## 2013-09-01 MED ORDER — POLYETHYLENE GLYCOL 3350 17 G PO PACK
17.0000 g | PACK | Freq: Every day | ORAL | Status: DC | PRN
  Filled 2013-09-01: qty 1

## 2013-09-01 MED ORDER — SODIUM CHLORIDE 0.9 % IV SOLN
INTRAVENOUS | Status: DC
  Administered 2013-09-01: 18:00:00 via INTRAVENOUS
  Administered 2013-09-02: 50 mL/h via INTRAVENOUS
  Administered 2013-09-04: 18:00:00 via INTRAVENOUS

## 2013-09-01 MED ORDER — TAMSULOSIN HCL 0.4 MG PO CAPS
0.4000 mg | ORAL_CAPSULE | Freq: Every day | ORAL | Status: DC
  Administered 2013-09-01 – 2013-09-10 (×9): 0.4 mg via ORAL
  Filled 2013-09-01 (×10): qty 1

## 2013-09-01 MED ORDER — ADULT MULTIVITAMIN W/MINERALS CH
1.0000 | ORAL_TABLET | Freq: Every day | ORAL | Status: DC
  Administered 2013-09-01 – 2013-09-10 (×9): 1 via ORAL
  Filled 2013-09-01 (×10): qty 1

## 2013-09-01 MED ORDER — BISACODYL 10 MG RE SUPP: 10.0000 mg | Freq: Every day | RECTAL | Status: DC | PRN

## 2013-09-01 MED ORDER — DARIFENACIN HYDROBROMIDE ER 15 MG PO TB24
15.0000 mg | ORAL_TABLET | Freq: Every day | ORAL | Status: DC
  Administered 2013-09-01 – 2013-09-10 (×9): 15 mg via ORAL
  Filled 2013-09-01 (×10): qty 1

## 2013-09-01 MED ORDER — ONDANSETRON HCL 4 MG PO TABS: 4.0000 mg | ORAL_TABLET | Freq: Four times a day (QID) | ORAL | Status: DC | PRN

## 2013-09-01 MED ORDER — IOHEXOL 300 MG/ML  SOLN
25.0000 mL | Freq: Once | INTRAMUSCULAR | Status: AC | PRN
  Administered 2013-09-01: 25 mL via ORAL

## 2013-09-01 MED ORDER — NITROGLYCERIN 0.4 MG SL SUBL: 0.4000 mg | SUBLINGUAL_TABLET | SUBLINGUAL | Status: DC | PRN

## 2013-09-01 MED ORDER — ATORVASTATIN CALCIUM 40 MG PO TABS
40.0000 mg | ORAL_TABLET | Freq: Every day | ORAL | Status: DC
  Administered 2013-09-01 – 2013-09-10 (×9): 40 mg via ORAL
  Filled 2013-09-01 (×10): qty 1

## 2013-09-01 MED ORDER — ACETAMINOPHEN 650 MG RE SUPP: 650.0000 mg | Freq: Four times a day (QID) | RECTAL | Status: DC | PRN

## 2013-09-01 MED ORDER — ONDANSETRON HCL 4 MG/2ML IJ SOLN: 4.0000 mg | Freq: Four times a day (QID) | INTRAMUSCULAR | Status: DC | PRN

## 2013-09-01 MED ORDER — ASPIRIN EC 81 MG PO TBEC
81.0000 mg | DELAYED_RELEASE_TABLET | Freq: Every day | ORAL | Status: DC
  Administered 2013-09-01 – 2013-09-06 (×6): 81 mg via ORAL
  Filled 2013-09-01 (×8): qty 1

## 2013-09-01 NOTE — ED Provider Notes (Signed)
CSN: [id number]     Arrival date & time 09/01/13  1033 History   First MD Initiated Contact with Patient 09/01/13 1040     Chief Complaint  Patient presents with  . Abdominal Pain   (Consider location/radiation/quality/duration/timing/severity/associated sxs/prior Treatment) HPI Comments: 80 year old male presents with wife and son due to him having progressive weakness over the past 4 days. Patient's has been having trouble getting up. He fell at the onset of this and had significant trouble standing back up due to generalized weakness. His wife has been having to help him stand up to go. He's not had any increased or decreased urine output. No dysuria. No vomiting or diarrhea. No chest symptoms. He has had intermittent "stomach pain" her and this time it seems to be relieved with antacids. He points to his epigastrium and right upper quadrant as the source of his pain. He denies any current painless the stomach is palpated. Denies a shortness of breath or cough. No fevers. It was it is not been ill prior to this but just all of a sudden seemed weak and ill.   Past Medical History  Diagnosis Date  . Hypokalemia   . Hyperlipidemia   . Atrial fibrillation     longterm persistent  . CAD (coronary artery disease)   . HTN (hypertension)   . Shortness of breath     PT STATES RELATED TO HIS AF AND HEART BEING OUT OF RHYTHM  . Back pain, chronic     SINCE BACK INJURY / FRACTURE  . GERD (gastroesophageal reflux disease)   . Arthritis   . Cough 06/16/12    C/O OF COUGH / COLD FOR A COUPLE OF WEEKS  . Cancer     RIGHT RENAL   Past Surgical History  Procedure Laterality Date  . Aortic valve replacement    . Coronary artery bypass graft    . Thoracic t12 compression fracture    . Radiofrequency ablation kidney      RIGHT    Family History  Problem Relation Age of Onset  . Hypertension    . Diabetes     History  Substance Use Topics  . Smoking status: Former Research scientist (life sciences)  . Smokeless  tobacco: Not on file  . Alcohol Use: No    Review of Systems  Constitutional: Negative for fever and chills.  Respiratory: Negative for cough and shortness of breath.   Cardiovascular: Negative for chest pain.  Gastrointestinal: Positive for abdominal pain. Negative for nausea, vomiting, constipation and blood in stool.  Genitourinary: Negative for dysuria.  Neurological: Positive for weakness. Negative for numbness and headaches.  All other systems reviewed and are negative.    Allergies  Review of patient's allergies indicates no known allergies.  Home Medications   Current Outpatient Rx  Name  Route  Sig  Dispense  Refill  . acetaminophen (TYLENOL) 325 MG tablet   Oral   Take 650 mg by mouth every 6 (six) hours as needed for mild pain.          [name] amLODipine (NORVASC) 5 MG tablet   Oral   Take 5 mg by mouth daily.         [name] amoxicillin (AMOXIL) 500 MG capsule   Oral   Take 500 mg by mouth as needed. Dental         . atorvastatin (LIPITOR) 40 MG tablet   Oral   Take 40 mg by mouth daily.         [name]  carvedilol (COREG) 25 MG tablet   Oral   Take 25 mg by mouth 2 (two) times daily with a meal.         . digoxin (LANOXIN) 0.125 MG tablet   Oral   Take 0.125 mg by mouth daily.         . Multiple Vitamin (MULTIVITAMIN WITH MINERALS) TABS tablet   Oral   Take 1 tablet by mouth daily.         . nitroGLYCERIN (NITROSTAT) 0.4 MG SL tablet   Sublingual   Place 1 tablet (0.4 mg total) under the tongue every 5 (five) minutes as needed. For chest pain   25 tablet   4   . potassium chloride (KLOR-CON) 8 MEQ CR tablet   Oral   Take 8 mEq by mouth daily.          . ramipril (ALTACE) 5 MG tablet   Oral   Take 5 mg by mouth 2 (two) times daily.         . ranitidine (ZANTAC) 150 MG tablet   Oral   Take 150 mg by mouth 2 (two) times daily as needed for heartburn.          . solifenacin (VESICARE) 10 MG tablet   Oral   Take 10 mg by mouth daily.           . Tamsulosin HCl (FLOMAX) 0.4 MG CAPS   Oral   Take 0.4 mg by mouth daily.          [name] torsemide (DEMADEX) 10 MG tablet   Oral   Take 20 mg by mouth daily.         . vitamin B-12 (CYANOCOBALAMIN) 1000 MCG tablet   Oral   Take 1,000 mcg by mouth daily.           [name] warfarin (COUMADIN) 5 MG tablet   Oral   Take 7.5-10 mg by mouth daily. He take one and a half tablets on Monday, Wednesday, Friday and two tablets the rest of the week.          BP 109/52  Pulse 86  Temp(Src) 99.6 F (37.6 C) (Oral)  SpO2 95% Physical Exam  Nursing note and vitals reviewed. Constitutional: He is oriented to person, place, and time. He appears well-developed and well-nourished. No distress.  HENT:  Head: Normocephalic and atraumatic.  Right Ear: External ear normal.  Left Ear: External ear normal.  Nose: Nose normal.  Eyes: Right eye exhibits no discharge. Left eye exhibits no discharge.  Neck: Neck supple.  Cardiovascular: Normal rate, regular rhythm, normal heart sounds and intact distal pulses.   Pulmonary/Chest: Effort normal.  Abdominal: Soft. There is tenderness in the right upper quadrant.  Musculoskeletal: He exhibits no edema.  Neurological: He is alert and oriented to person, place, and time.  Skin: Skin is warm and dry.    ED Course  Procedures (including critical care time) Labs Review Labs Reviewed  CBC WITH DIFFERENTIAL - Abnormal; Notable for the following:    WBC 11.7 (*)    RBC 4.11 (*)    Hemoglobin 12.2 (*)    HCT 33.8 (*)    MCHC 36.1 (*)    Neutrophils Relative % 79 (*)    Neutro Abs 9.2 (*)    Lymphocytes Relative 8 (*)    Monocytes Relative 13 (*)    Monocytes Absolute 1.6 (*)    All other components within normal limits  COMPREHENSIVE METABOLIC PANEL - Abnormal;  Notable for the following:    Sodium 131 (*)    Chloride 92 (*)    Glucose, Bld 142 (*)    Albumin 3.2 (*)    AST 75 (*)    GFR calc non Af Amer 80 (*)    All other components  within normal limits  PROTIME-INR - Abnormal; Notable for the following:    Prothrombin Time 28.8 (*)    INR 2.83 (*)    All other components within normal limits  URINALYSIS, ROUTINE W REFLEX MICROSCOPIC - Abnormal; Notable for the following:    Color, Urine AMBER (*)    APPearance CLOUDY (*)    Hgb urine dipstick TRACE (*)    Bilirubin Urine SMALL (*)    Protein, ur 100 (*)    All other components within normal limits  TROPONIN I - Abnormal; Notable for the following:    Troponin I 0.47 (*)    All other components within normal limits  DIGOXIN LEVEL - Abnormal; Notable for the following:    Digoxin Level <0.3 (*)    All other components within normal limits  TROPONIN I - Abnormal; Notable for the following:    Troponin I 0.46 (*)    All other components within normal limits  URINE MICROSCOPIC-ADD ON - Abnormal; Notable for the following:    Casts HYALINE CASTS (*)    All other components within normal limits  LIPASE, BLOOD  TROPONIN I  TROPONIN I  INFLUENZA PANEL BY PCR  TSH  HEMOGLOBIN A1C  TROPONIN I  TROPONIN I  TROPONIN I  CORTISOL-AM, BLOOD  COMPREHENSIVE METABOLIC PANEL  CBC  CG4 I-STAT (LACTIC ACID)   Imaging Review Dg Chest 2 View  09/01/2013   CLINICAL DATA:  Weakness. Atrial fibrillation. High blood pressure. Renal cell carcinoma. Prior smoker.  EXAM: CHEST  2 VIEW  COMPARISON:  06/16/2012.  FINDINGS: Post median sternotomy with fracture of upper sternal wires. Post CABG. Heart size top-normal. Calcified mildly tortuous aorta.  T12 compression fracture with prior cement augmentation unchanged. No new compression fracture detected.  No infiltrate, congestive heart failure or pneumothorax.  No plain film evidence of pulmonary malignancy/metastatic disease.  IMPRESSION: Post median sternotomy with fracture of upper sternal wires. Post CABG. Heart size top-normal.  Calcified mildly tortuous aorta.  T12 compression fracture with prior cement augmentation unchanged. No  new compression fracture detected.  No infiltrate, congestive heart failure or pneumothorax.  No plain film evidence of pulmonary malignancy/metastatic disease.   Electronically Signed   By: Chauncey Cruel M.D.   On: 09/01/2013 12:22   US Abdomen Complete  09/01/2013   CLINICAL DATA:  Right upper quadrant pain  EXAM: ULTRASOUND ABDOMEN COMPLETE  COMPARISON:  None.  FINDINGS: Gallbladder:  There is mild gallbladder wall thickening to 6 mm. No pericholecystic fluid. There is dependent sludge within the gallbladder. Negative sonographic Murphy's sign.  Common bile duct:  Diameter: Normal diameter 4 mm.  Liver:  No focal lesion identified. Within normal limits in parenchymal echogenicity.  IVC:  No abnormality visualized.  Pancreas:  Visualized portion unremarkable.  Spleen:  Size and appearance within normal limits.  Right Kidney: 12.4cm in length. No evidence of hydronephrosis or stones. Anechoic 1.5 cm cyst in the upper pole. This is proteinaceous nonenhancing cyst on comparison CT.  Left Kidney:  Length: 12.7. Echogenicity within normal limits. No mass or hydronephrosis visualized.  Abdominal aorta:  No aneurysm visualized.  IMPRESSION:  Gallbladder sludge and gallbladder wall thickening without evidence of acute cholecystitis.  The findings could represent chronic cholecystitis.   Electronically Signed   By: Suzy Bouchard M.D.   On: 09/01/2013 12:06    EKG Interpretation    Date/Time:  Tuesday September 01 2013 11:56:05 EST Ventricular Rate:  74 PR Interval:    QRS Duration: 110 QT Interval:  406 QTC Calculation: 450 R Axis:   72 Text Interpretation:  Atrial fibrillation RSR' in V1 or V2, right VCD or RVH Nonspecific T wave abnormality Confirmed by Victorya Hillman  MD, Arriah Wadle ([id number]) on 09/01/2013 12:22:05 PM            MDM   1. Aortic valve disorder   2. Atrial fibrillation   3. Elevated troponin   4. Abdominal  pain, other specified site   5. Hyponatremia    Patient has RUQ tenderness but soft  abd. Intermittent pain. No fevers here. U/S concerning for sludge with chronic cholecystitis, no signs of acute. He is not ill appearing. Cardiac w/u initiated for weakness and his intermittent "gas pain" for atypical ACS. EKG nonspecific but no obvious ischemia. Troponin mildly elevated. Cards consulted and feel this is not cardiac but will cycle enzymes. Given these multiple problems I feel he will benefit from admission and will need further testing.     Ephraim Hamburger, MD 09/01/13 1758

## 2013-09-01 NOTE — Progress Notes (Signed)
Utilization Review completed.  Mikael Skoda RN CM  

## 2013-09-01 NOTE — H&P (Signed)
[facility] History and Physical  CAEDEN FOOTS NGE:[id number] DOB: Feb 18, 1934 DOA: 09/01/2013  Referring physician:  Sherwood Gambler PCP:  Gennette Pac, MD   Chief Complaint:  Abdominal pain and weakness  HPI:  The patient is a 80 y.o. 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  Pt is a vague historian  The patient was last at their baseline health 2 weeks ago.  He is normal ambulatory without assist device and lives at home with wife.  He started having episodes of 4/10 difficult to characterize pain of bilateral upper quadrants without radiation to the back that would last a few seconds and then resolve. This was not associated with any shortness of breath, diaphoresis. The episodes have not become more severe, however they have become more frequent. They are brought on by movement and by people pressing on his belly. He is also had some nausea which has gotten progressively worse bed mostly responded to antacids. He had very poor appetite for the last 3 days. He denies fevers and chills. He is normally very regular with his bowel movements, but he has not had a bowel movement in 4 days. 4 days ago, he woke up to week to get out of bed. He has been assisted somewhat by his wife to transfer to chair and bathroom today, however, he was unable to even get out of bed. He slid to the floor it required assistance of other family members to get him back to the chair and into the bed. He denies chest pain, orthopnea, PND, increased lower terminates swelling.  He denies skin infection, dysuria, cough, wheezing.    In the ER, VS were initially stable 109/52, T 99.60F, P 86, 95% on RA.  WBC 11.7, hgb 12.2, plt 152, Na 131, chloride 92, INR 2.83.  Troponin 0.47,   ECG a-fib, no acute ischemic changes.  Pt  seen by cardiology who believes this is related to demand ischemia.  CXR without acute infiltrate.  UA neg.  [place] abd demonstrated thickened gallbladder wall with sludge but no evidence of acute cholecystitis.  Report suggests findings may be related to chronic cholecystitis.    Review of Systems:  General:  Denies fevers, chills, weight loss or gain HEENT: hard of hearing.  Denies rhinorrhea, sinus congestion, sore throat CV:  Denies chest pain and palpitations, lower extremity edema.  PULM:  Denies SOB, wheezing, cough.   GI:  Per HPI GU:  Denies dysuria, frequency, urgency ENDO:  Denies polyuria, polydipsia.   HEME:  Denies hematemesis, blood in stools, melena, abnormal bruising or bleeding.  LYMPH:  Denies lymphadenopathy.   MSK:  Denies arthralgias, myalgias.   DERM:  Denies skin rash or ulcer.   NEURO:  Denies focal numbness, weakness, slurred speech, confusion, facial droop.  PSYCH:  Denies anxiety and depression.    Past Medical History  Diagnosis Date  . Hypokalemia   . Hyperlipidemia   . Atrial fibrillation     longterm persistent  . CAD (coronary artery disease)   . HTN (hypertension)   . Shortness of breath     PT STATES RELATED TO HIS AF AND HEART BEING OUT OF RHYTHM  . Back pain, chronic     SINCE BACK INJURY / FRACTURE  . GERD (gastroesophageal reflux disease)   . Arthritis   . Cough  06/16/12    C/O OF COUGH / COLD FOR A COUPLE OF WEEKS  . Cancer     RIGHT RENAL  . Aortic valve replaced   . Stomach ulcer   . Constipation   . BPH (benign prostatic hyperplasia)     grapey   Past Surgical History  Procedure Laterality Date  . Aortic valve replacement    . Coronary artery bypass graft    . Thoracic t12 compression fracture    . Radiofrequency ablation kidney      RIGHT (two surgeries)   Social History:  reports that he quit smoking about 35 years ago. His smoking use included Cigarettes. He has a 40 pack-year smoking history. He does not have any smokeless  tobacco history on file. He reports that he does not drink alcohol or use illicit drugs. Lives with his wife, good family support, ambulates without assist device  No Known Allergies  Family History  Problem Relation Age of Onset  . Hypertension Sister   . Diabetes Sister   . Hypertension Mother   . Hypertension Brother   . Diabetes Brother   . Diabetes Mother      Prior to Admission medications   Medication Sig Start Date End Date Taking? Authorizing Provider  acetaminophen (TYLENOL) 325 MG tablet Take 650 mg by mouth every 6 (six) hours as needed for mild pain.    Yes Historical Provider, MD  amLODipine (NORVASC) 5 MG tablet Take 5 mg by mouth daily.   Yes Historical Provider, MD  amoxicillin (AMOXIL) 500 MG capsule Take 500 mg by mouth as needed (For dental procedures.).    Yes Historical Provider, MD  atorvastatin (LIPITOR) 40 MG tablet Take 40 mg by mouth daily.   Yes Historical Provider, MD  carvedilol (COREG) 25 MG tablet Take 25 mg by mouth 2 (two) times daily with a meal.   Yes Historical Provider, MD  Multiple Vitamin (MULTIVITAMIN WITH MINERALS) TABS tablet Take 1 tablet by mouth daily.   Yes Historical Provider, MD  nitroGLYCERIN (NITROSTAT) 0.4 MG SL tablet Place 1 tablet (0.4 mg total) under the tongue every 5 (five) minutes as needed. For chest pain 04/21/13  Yes Josue Hector, MD  potassium chloride (KLOR-CON) 8 MEQ CR tablet Take 8 mEq by mouth daily.    Yes Historical Provider, MD  ramipril (ALTACE) 5 MG tablet Take 5 mg by mouth 2 (two) times daily.   Yes Historical Provider, MD  ranitidine (ZANTAC) 150 MG tablet Take 150 mg by mouth 2 (two) times daily as needed for heartburn.    Yes Historical Provider, MD  solifenacin (VESICARE) 10 MG tablet Take 10 mg by mouth daily.    Yes Historical Provider, MD  Tamsulosin HCl (FLOMAX) 0.4 MG CAPS Take 0.4 mg by mouth daily.    Yes Historical Provider, MD  torsemide (DEMADEX) 10 MG tablet Take 20 mg by mouth daily.   Yes  Historical Provider, MD  vitamin B-12 (CYANOCOBALAMIN) 1000 MCG tablet Take 1,000 mcg by mouth daily.     Yes Historical Provider, MD  warfarin (COUMADIN) 5 MG tablet Take 7.5-10 mg by mouth daily. He takes one and a half tablets on Monday, Wednesday, Friday and two tablets the rest of the week.   Yes Historical Provider, MD  digoxin (LANOXIN) 0.125 MG tablet Take 0.125 mg by mouth daily.    Historical Provider, MD   Physical Exam: Filed Vitals:   09/01/13 1042 09/01/13 1515  BP: 109/52 84/34  Pulse: 86 71  Temp: 99.6 F (37.6 C) 98.7 F (37.1 C)  TempSrc: Oral Oral  Resp:  17  SpO2: 95% 98%     General:  CM, NAD  Eyes:  PERRL, anicteric, non-injected.  ENT:  Nares clear.  OP clear, non-erythematous without plaques or exudates.  MMM.  Neck:  Supple without TM or JVD.    Lymph:  No cervical, supraclavicular, or submandibular LAD.  Cardiovascular:  IRRR, normal S1, metallic S2, 1/6 systolic murmur across precordium, no r/g.  2+ pulses, warm extremities  Respiratory:  CTA bilaterally without increased WOB.  Abdomen:  NABS.  Soft, ND, intermittently TTP in the RUQ but neg murphy's, no rebound or guarding  Skin:  No rashes or focal lesions.  Musculoskeletal:  Normal bulk and tone.  No LE edema.  Psychiatric:  A & O x 4.  Appropriate affect.   Neurologic:  CN 3-12 intact.  5/5 strength.  Sensation intact.  Labs on Admission:  Basic Metabolic Panel:  Recent Labs Lab 09/01/13 1055  NA 131*  K 4.1  CL 92*  CO2 26  GLUCOSE 142*  BUN 18  CREATININE 0.90  CALCIUM 9.2   Liver Function Tests:  Recent Labs Lab 09/01/13 1055  AST 75*  ALT 36  ALKPHOS 90  BILITOT 1.2  PROT 6.3  ALBUMIN 3.2*    Recent Labs Lab 09/01/13 1055  LIPASE 17   No results found for this basename: AMMONIA,  in the last 168 hours CBC:  Recent Labs Lab 09/01/13 1055  WBC 11.7*  NEUTROABS 9.2*  HGB 12.2*  HCT 33.8*  MCV 82.2  PLT 152   Cardiac Enzymes:  Recent Labs Lab  09/01/13 1055 09/01/13 1440  TROPONINI 0.47* 0.46*    BNP (last 3 results) No results found for this basename: PROBNP,  in the last 8760 hours CBG: No results found for this basename: GLUCAP,  in the last 168 hours  Radiological Exams on Admission: Dg Chest 2 View  09/01/2013   CLINICAL DATA:  Weakness. Atrial fibrillation. High blood pressure. Renal cell carcinoma. Prior smoker.  EXAM: CHEST  2 VIEW  COMPARISON:  06/16/2012.  FINDINGS: Post median sternotomy with fracture of upper sternal wires. Post CABG. Heart size top-normal. Calcified mildly tortuous aorta.  T12 compression fracture with prior cement augmentation unchanged. No new compression fracture detected.  No infiltrate, congestive heart failure or pneumothorax.  No plain film evidence of pulmonary malignancy/metastatic disease.  IMPRESSION: Post median sternotomy with fracture of upper sternal wires. Post CABG. Heart size top-normal.  Calcified mildly tortuous aorta.  T12 compression fracture with prior cement augmentation unchanged. No new compression fracture detected.  No infiltrate, congestive heart failure or pneumothorax.  No plain film evidence of pulmonary malignancy/metastatic disease.   Electronically Signed   By: Chauncey Cruel M.D.   On: 09/01/2013 12:22   US Abdomen Complete  09/01/2013   CLINICAL DATA:  Right upper quadrant pain  EXAM: ULTRASOUND ABDOMEN COMPLETE  COMPARISON:  None.  FINDINGS: Gallbladder:  There is mild gallbladder wall thickening to 6 mm. No pericholecystic fluid. There is dependent sludge within the gallbladder. Negative sonographic Murphy's sign.  Common bile duct:  Diameter: Normal diameter 4 mm.  Liver:  No focal lesion identified. Within normal limits in parenchymal echogenicity.  IVC:  No abnormality visualized.  Pancreas:  Visualized portion unremarkable.  Spleen:  Size and appearance within normal limits.  Right Kidney: 12.4cm in length. No evidence of hydronephrosis or stones. Anechoic 1.5 cm cyst in  the upper  pole. This is proteinaceous nonenhancing cyst on comparison CT.  Left Kidney:  Length: 12.7. Echogenicity within normal limits. No mass or hydronephrosis visualized.  Abdominal aorta:  No aneurysm visualized.  IMPRESSION:  Gallbladder sludge and gallbladder wall thickening without evidence of acute cholecystitis. The findings could represent chronic cholecystitis.   Electronically Signed   By: Suzy Bouchard M.D.   On: 09/01/2013 12:06    EKG: Independently reviewed. A-fib, rate controlled.  Isolated T-wave inversion V2, aVL, stable.  Borderline prolonged QTc  Assessment/Plan Active Problems:   HYPERTENSION   CAD   Atrial fibrillation   AORTIC VALVE REPLACEMENT, HX OF   Chronic diastolic heart failure   Abdominal  pain, other specified site   Constipation   Hypotension   Leukocytosis   Normocytic anemia   Hyponatremia  ---  Abdominal pain, nonspecific.  AST mildly elevated, but suspect this may be due to NASH.  Lipase and other liver functions tests normal.  Lactic acid neg.  UA neg.  Patient was not constipated or having diarrhea when symptoms started.  May be related to gallbladder, however, patient also has history of RCC s/p two radiofrequency ablations.   -  CT abd/pelvis -  General surgery consultation given suspicion of chronic cholecystitis on [place].   -  Consider HIDA to eval for gallbladder EF -  Zofran prn nausea  -  CLD -  Dig level:  < 0.3  Progressive weakness, ddx includes metabolic process (hypotension and leukocytosis suggestive of infection), spinal stenosis, claudication, however, acute onset makes these less likely.  GBS possible, but pt still strong on exam, no numbness.  Possible mild dehydration with borderline elevated BUN:cr -  IVF -  PT/OT evaluation -  TSH -  Cortisol   Hypotension in ER -  Hold norvasc, ACEI -  Hold parameter for BB -  NS bolus x 556mL and gentle hydratino  CAD with mild elevation of troponin, s/p CABG/AVR, likely NSTEMI  type 2 -  troponins -  Telemetry -  ASA, statin, BB -  Cardiology following  A-fib, on A/C and rate controlled on BB and dig -  Heparin gtt with INR < 2  GERD, possible acid-reflux like symptoms -  Start PPI  Leukocytosis, mild, suggestive of intraabdominal problem or ACS Normocytic anemia, chronic, stable, likely due to chronic disease Hyponatremia, also suggestive of dehydration  Diet:  CLD Access:  PIV IVF:  yes Proph:  Warfarin for now  Code Status: full Family Communication: patient and wife Disposition Plan: Admit to telemetry  Time spent: 60 min Janece Canterbury [facility] Pager [phone]  If 7PM-7AM, please contact night-coverage [email or web address] Password [facility] 09/01/2013, 4:43 PM

## 2013-09-01 NOTE — ED Notes (Signed)
MD at bedside. 

## 2013-09-01 NOTE — Progress Notes (Signed)
CRITICAL VALUE ALERT  Critical value received:  Troponin 0.49  Date of notification: 09/01/2013  Time of notification:  1928  Critical value read back:yes  Nurse who received alert:  Virgina Norfolk  MD notified (1st page):  Baltazar Najjar   Time of first page:  1929  MD notified (2nd page):   Time of second page:  Responding MD:  Baltazar Najjar  Time MD responded:  1934  New orders were placed. Will continue to monitor.

## 2013-09-01 NOTE — Progress Notes (Signed)
CRITICAL VALUE ALERT  Critical value received:  Troponin 0.44  Date of notification:  09/01/2013  Time of notification:  2156   Critical value read back:yes  Nurse who received alert:  Virgina Norfolk  MD notified (1st page):  Baltazar Najjar, NP  Time of first page:  2202  MD notified (2nd page): Baltazar Najjar, NP  Time of second page: 2042  Responding MD:  N/A  Time MD responded:  N/A  NP on call notified via text page. No response to text page. No new orders placed. Will continue to monitor.

## 2013-09-01 NOTE — ED Notes (Signed)
Ultrasound performing at bedside, unable to measure EKG at this time.

## 2013-09-01 NOTE — Consult Note (Signed)
.dlog    CARDIOLOGY CONSULT NOTE  Patient ID: AYDYN LIZZA MRN: [id number] DOB/AGE: Aug 27, 1934 80 y.o.  Admit date: 09/01/2013 Primary Physician: Dr. Karlton Lemon Primary Cardiologist: Dr. Johnsie Cancel Reason for Consultation: Elevated troponin  HPI: 80 yo with history of CABG + mechanical AVR in 10/13 and permanent atrial fibrillation on warfarin came to the ER today with diffuse weakness and abdominal pain.  Until last Friday, patient was at his baseline.  He is not very active but had no exertional chest pain or significant exertional dyspnea.  On Friday, he was very weak.  He slipped and slid to the ground.  He was unable to get up.  Family got him in bed.  Since that time, he has been profoundly weak with minimal ability to ambulate.  He has been in bed.  On Friday, wife says that he had visual hallucinations though he is not having these anymore. The weakness is generalized with no focality.  He also has had upper abdominal pain fairly constantly for 1-2 weeks.  It is relatively mild and is localized to the RUQ.  He is tender in the RUQ.  The ER MD did an abdominal US and said it looked like "chronic cholecystitis."  He has had no chest pain.  No fever/chills/nausea/diarrhea/cough/congestion.  CXR did not show PNA.  UA did not show UTI.  He was noted to have a mildly elevated troponin level so cardiology was called.  No acute changes on ECG (chronic atrial fibrillation).   Review of systems complete and found to be negative unless listed above in HPI  Past Medical History: 1. [employer] AVR 10/03 2. Permanent atrial fibrillation on warfarin 3. HTN 4. CAD s/p CABG in 10/03.  Last Cardiolite in 9/11 with fixed basal to mid inferior perfusion defect.  Last echo in 9/14 with EF 55-60%, grade II diastolic dysfunction, mechanical aortic valve, RV moderately dilated with mildly decreased systolic function, PA systolic pressure 41 mmHg . 5. Renal cell carcinoma on right s/p cryoablation in 8/10 and 10/13.    No current facility-administered medications for this encounter.   Current Outpatient Prescriptions  Medication Sig Dispense Refill  . acetaminophen (TYLENOL) 325 MG tablet Take 650 mg by mouth every 6 (six) hours as needed for mild pain.       [name] amLODipine (NORVASC) 5 MG tablet Take 5 mg by mouth daily.      [name] amoxicillin (AMOXIL) 500 MG capsule Take 500 mg by mouth as needed (For dental procedures.).       [name] atorvastatin (LIPITOR) 40 MG tablet Take 40 mg by mouth daily.      . carvedilol (COREG) 25 MG tablet Take 25 mg by mouth 2 (two) times daily with a meal.      . Multiple Vitamin (MULTIVITAMIN WITH MINERALS) TABS tablet Take 1 tablet by mouth daily.      . nitroGLYCERIN (NITROSTAT) 0.4 MG SL tablet Place 1 tablet (0.4 mg total) under the tongue every 5 (five) minutes as needed. For chest pain  25 tablet  4  . potassium chloride (KLOR-CON) 8 MEQ CR tablet Take 8 mEq by mouth daily.       . ramipril (ALTACE) 5 MG tablet Take 5 mg by mouth 2 (two) times daily.      . ranitidine (ZANTAC) 150 MG tablet Take 150 mg by mouth 2 (two) times daily as needed for heartburn.       . solifenacin (VESICARE) 10 MG tablet Take 10 mg by mouth  daily.       . Tamsulosin HCl (FLOMAX) 0.4 MG CAPS Take 0.4 mg by mouth daily.       [name] torsemide (DEMADEX) 10 MG tablet Take 20 mg by mouth daily.      . vitamin B-12 (CYANOCOBALAMIN) 1000 MCG tablet Take 1,000 mcg by mouth daily.        [name] warfarin (COUMADIN) 5 MG tablet Take 7.5-10 mg by mouth daily. He takes one and a half tablets on Monday, Wednesday, Friday and two tablets the rest of the week.      . digoxin (LANOXIN) 0.125 MG tablet Take 0.125 mg by mouth daily.         Family History  Problem Relation Age of Onset  . Hypertension    . Diabetes      History   Social History  . Marital Status: Married    Spouse Name: N/A    Number of Children: N/A  . Years of Education: N/A   Occupational History  . Not on file.   Social History Main Topics   . Smoking status: Former Research scientist (life sciences)  . Smokeless tobacco: Not on file  . Alcohol Use: No  . Drug Use: No  . Sexual Activity: Not on file   Other Topics Concern  . Not on file   Social History Narrative  . No narrative on file    Physical exam Blood pressure 109/52, pulse 86, temperature 99.6 F (37.6 C), temperature source Oral, SpO2 95.00%. General: NAD Neck: JVP 8 cm, no thyromegaly or thyroid nodule.  Lungs: Occasional rhonchi. CV: Nondisplaced PMI.  Heart irregular S1/S2, mechanical S2, no S3/S4, 2/6 early SEM RUSB.  No peripheral edema.  No carotid bruit.  Normal pedal pulses.  Abdomen: Soft, RUQ tenderness without rebound/guarding, no hepatosplenomegaly, no distention.  Skin: Intact without lesions or rashes.  Neurologic: Alert and oriented x 3.  Psych: Normal affect. Extremities: No clubbing or cyanosis.  HEENT: Normal.   Labs:   Lab Results  Component Value Date   WBC 11.7* 09/01/2013   HGB 12.2* 09/01/2013   HCT 33.8* 09/01/2013   MCV 82.2 09/01/2013   PLT 152 09/01/2013    Recent Labs Lab 09/01/13 1055  NA 131*  K 4.1  CL 92*  CO2 26  BUN 18  CREATININE 0.90  CALCIUM 9.2  PROT 6.3  BILITOT 1.2  ALKPHOS 90  ALT 36  AST 75*  GLUCOSE 142*   Lab Results  Component Value Date   CKTOTAL 257* 08/24/2008   CKMB 2.2 08/24/2008   TROPONINI 0.47* 09/01/2013    Radiology:  - CXR: No PNA or pulmonary edema  EKG: atrial fibrillation rate 80, iRBBB  ASSESSMENT AND PLAN:  80 yo with history of CABG + mechanical AVR in 10/13 and permanent atrial fibrillation on warfarin came to the ER today with diffuse weakness and abdominal pain.  As part of his workup, he was found to have a mildly elevated troponin.  1. Generalized weakness: No focality.  Has been unable to get out of bed since Friday.  Concerned for systemic illness such as influenza but denies fever.  WBCs mildly elevated.  Temperate 99.6 in ER.  No PNA noted on CXR, no UTI on UA. BUN not elevated, does not appear  dehydrated.  - Would admit to hospitalist for further workup.  Would get influenza testing.  2. Abdominal pain: Centered in RUQ, no peritoneal signs.  "Chronic cholecystitis changes" on initial RUQ [place] look by ER MD.   -  Needs formal gallbladder workup with full abdominal US>  3. Atrial fibrillation: Permanent.  HR controlled on Coreg.   - Continue Coreg but with lowish BP would lower dose to 12.5 mg bid.  - Hold coumadin for now (?need for cath, etc), cover with heparin when INR < 2.  Restart coumadin if no procedure needed.  4. CAD: h/o CABG with AVR.  No chest pain or ischemic ECG changes.  Elevated troponin (mildly).  I question whether this is demand ischemia in setting of whatever the underlying process is that is causing his generalized weakness. Think less likely true ACS.  - Cycle cardiac enzymes. - heparin gtt when INR < 2.  - ASA 81, statin, Coreg.  5. HTN: BP is actually running on the low side.  Would hold amlodipine and decrease Coreg as above.  6. Mechanical aortic valve: Stable on recent echo.  Continue ASA 81, heparin gtt when INR < 2.   Loralie Champagne 09/01/2013 2:53 PM

## 2013-09-01 NOTE — Progress Notes (Signed)
RN paged NP with elevated troponin result. Other troponins also only mildly elevated. Pt has been seen by cardio. Cardio thought demand ischemia, less likely ACS. Pt has had 325 ASA and is on 81 daily. INR is high so can not start Heparin gtt at present. Pt is having NO active chest pain. Will report findings to attending in am and call cardio for change of plan if pt should begin to have active chest pain or EKG changes. Clance Boll, NP [facility]

## 2013-09-01 NOTE — ED Notes (Addendum)
Patient with diffuse abdominal pain which he rates as a one.  Patient has a golf ball sized mass in his right upper quadrant, which is not painful to palpation.  Patient has not sought medical help for this.  Patient has been on a diabetic diet since beginning of December and has lost forty pounds.  Denies nausea, vomiting, fever.  Also reports general weakness and unsteady on his feet.  Poor oral intake due to lack of appetite. CBG-147

## 2013-09-01 NOTE — ED Notes (Signed)
Bed: WA22 Expected date:  Expected time:  Means of arrival:  Comments: abd pain 

## 2013-09-02 DIAGNOSIS — I959 Hypotension, unspecified: Secondary | ICD-10-CM

## 2013-09-02 DIAGNOSIS — K819 Cholecystitis, unspecified: Secondary | ICD-10-CM

## 2013-09-02 DIAGNOSIS — D72829 Elevated white blood cell count, unspecified: Secondary | ICD-10-CM

## 2013-09-02 DIAGNOSIS — R109 Unspecified abdominal pain: Secondary | ICD-10-CM

## 2013-09-02 LAB — COMPREHENSIVE METABOLIC PANEL
ALBUMIN: 2.9 g/dL — AB (ref 3.5–5.2)
ALK PHOS: 102 U/L (ref 39–117)
ALT: 52 U/L (ref 0–53)
ALT: 67 U/L — AB (ref 0–53)
AST: 78 U/L — AB (ref 0–37)
AST: 93 U/L — ABNORMAL HIGH (ref 0–37)
Albumin: 3 g/dL — ABNORMAL LOW (ref 3.5–5.2)
Alkaline Phosphatase: 101 U/L (ref 39–117)
BILIRUBIN TOTAL: 0.8 mg/dL (ref 0.3–1.2)
BUN: 18 mg/dL (ref 6–23)
BUN: 15 mg/dL (ref 6–23)
CALCIUM: 9.2 mg/dL (ref 8.4–10.5)
CHLORIDE: 96 meq/L (ref 96–112)
CHLORIDE: 94 meq/L — AB (ref 96–112)
CO2: 26 mEq/L (ref 19–32)
CO2: 26 mEq/L (ref 19–32)
CREATININE: 0.9 mg/dL (ref 0.50–1.35)
Calcium: 9.2 mg/dL (ref 8.4–10.5)
Creatinine, Ser: 0.85 mg/dL (ref 0.50–1.35)
GFR calc Af Amer: >90 mL/min (ref >90)
GFR calc Af Amer: >90 mL/min (ref >90)
GFR calc non Af Amer: 80 mL/min — ABNORMAL LOW (ref >90)
GFR calc non Af Amer: 82 mL/min — ABNORMAL LOW (ref >90)
GLUCOSE: 118 mg/dL — AB (ref 70–99)
Glucose, Bld: 117 mg/dL — ABNORMAL HIGH (ref 70–99)
POTASSIUM: 4.4 meq/L (ref 3.7–5.3)
Potassium: 4.2 mEq/L (ref 3.7–5.3)
Sodium: 132 mEq/L — ABNORMAL LOW (ref 137–147)
Sodium: 130 mEq/L — ABNORMAL LOW (ref 137–147)
TOTAL PROTEIN: 6 g/dL (ref 6.0–8.3)
TOTAL PROTEIN: 6.1 g/dL (ref 6.0–8.3)
Total Bilirubin: 0.8 mg/dL (ref 0.3–1.2)

## 2013-09-02 LAB — PROTIME-INR
INR: 2.38 — ABNORMAL HIGH (ref 0.00–1.49)
Prothrombin Time: 25.2 seconds — ABNORMAL HIGH (ref 11.6–15.2)

## 2013-09-02 LAB — CORTISOL-AM, BLOOD: CORTISOL - AM: 14.1 ug/dL (ref 4.3–22.4)

## 2013-09-02 LAB — CBC
HCT: 32.9 % — ABNORMAL LOW (ref 39.0–52.0)
HEMOGLOBIN: 11.2 g/dL — AB (ref 13.0–17.0)
MCH: 28.4 pg (ref 26.0–34.0)
MCHC: 34 g/dL (ref 30.0–36.0)
MCV: 83.3 fL (ref 78.0–100.0)
Platelets: 149 10*3/uL — ABNORMAL LOW (ref 150–400)
RBC: 3.95 MIL/uL — AB (ref 4.22–5.81)
RDW: 12.5 % (ref 11.5–15.5)
WBC: 9.3 10*3/uL (ref 4.0–10.5)

## 2013-09-02 MED ORDER — PIPERACILLIN-TAZOBACTAM 3.375 G IVPB
3.3750 g | Freq: Three times a day (TID) | INTRAVENOUS | Status: DC
  Administered 2013-09-02 – 2013-09-08 (×17): 3.375 g via INTRAVENOUS
  Filled 2013-09-02 (×19): qty 50

## 2013-09-02 NOTE — Progress Notes (Signed)
Physical Therapy Treatment Patient Details Name: NESTOR WIENEKE MRN: [id number] DOB: 1934-02-08 Today's Date: 09/02/2013 Time: 1914-7829 PT Time Calculation (min): 15 min  PT Assessment / Plan / Recommendation  History of Present Illness 80 yo with history of CABG + mechanical AVR in 10/13 and permanent atrial fibrillation on warfarin came to the ER today with diffuse weakness and abdominal pain. Until last Friday, patient was at his baseline. He is not very active but had no exertional chest pain or significant exertional dyspnea. On Friday, he was very weak. He slipped and slid to the ground. He was unable to get up. Family got him in bed. Since that time, he has been profoundly weak with minimal ability to ambulate. He has been in bed. On Friday, wife says that he had visual hallucinations though he is not having these anymore. The weakness is generalized with no focality. He also has had upper abdominal pain fairly constantly for 1-2 weeks. It is relatively mild and is localized to the RUQ. He is tender in the RUQ. The ER MD did an abdominal US and said it looked like "chronic cholecystitis." He has had no chest pain. No fever/chills/nausea/diarrhea/cough/congestion. CXR did not show PNA. UA did not show UTI. He was noted to have a mildly elevated troponin level so cardiology was called. No acute changes on ECG (chronic atrial fibrillation).     PT Comments   Pt very motivated and cooperative but impulsive and with questionable safety awareness  Follow Up Recommendations  Home health PT     Does the patient have the potential to tolerate intense rehabilitation     Barriers to Discharge        Equipment Recommendations  None recommended by PT    Recommendations for Other Services OT consult  Frequency Min 3X/week   Progress towards PT Goals Progress towards PT goals: Progressing toward goals  Plan Current plan remains appropriate    Precautions / Restrictions Precautions Precautions:  Fall Precaution Comments: Increased L drift with increased fatigue Restrictions Weight Bearing Restrictions: No   Pertinent Vitals/Pain No c/o pain this pm    Mobility  Bed Mobility Overal bed mobility: Modified Independent Transfers Overall transfer level: Needs assistance Transfers: Sit to/from Stand Sit to Stand: Min guard General transfer comment: cues for transition position and saftey with transfers Ambulation/Gait Ambulation/Gait assistance: Min assist Ambulation Distance (Feet): 400 Feet (100' HHA and 300' with RW) Assistive device: Rolling walker (2 wheeled) Gait Pattern/deviations: Step-through pattern;Decreased step length - left;Staggering left General Gait Details: Pt demonstrating general instability with L drift during ambulation - largely corrected wtih use of RW and cues for pacing, posture and position from RW    Exercises     PT Diagnosis:    PT Problem List:   PT Treatment Interventions:     PT Goals (current goals can now be found in the care plan section) Acute Rehab PT Goals Patient Stated Goal: Home to resume previous lifestyle PT Goal Formulation: With patient Time For Goal Achievement: 09/16/13 Potential to Achieve Goals: Good  Visit Information  Last PT Received On: 09/02/13 Assistance Needed: +1 History of Present Illness: 80 yo with history of CABG + mechanical AVR in 10/13 and permanent atrial fibrillation on warfarin came to the ER today with diffuse weakness and abdominal pain. Until last Friday, patient was at his baseline. He is not very active but had no exertional chest pain or significant exertional dyspnea. On Friday, he was very weak. He slipped and slid  to the ground. He was unable to get up. Family got him in bed. Since that time, he has been profoundly weak with minimal ability to ambulate. He has been in bed. On Friday, wife says that he had visual hallucinations though he is not having these anymore. The weakness is generalized with no  focality. He also has had upper abdominal pain fairly constantly for 1-2 weeks. It is relatively mild and is localized to the RUQ. He is tender in the RUQ. The ER MD did an abdominal US and said it looked like "chronic cholecystitis." He has had no chest pain. No fever/chills/nausea/diarrhea/cough/congestion. CXR did not show PNA. UA did not show UTI. He was noted to have a mildly elevated troponin level so cardiology was called. No acute changes on ECG (chronic atrial fibrillation).      Subjective Data  Patient Stated Goal: Home to resume previous lifestyle   Cognition  Cognition Arousal/Alertness: Awake/alert Behavior During Therapy: WFL for tasks assessed/performed Overall Cognitive Status: Within Functional Limits for tasks assessed    Balance     End of Session PT - End of Session Equipment Utilized During Treatment: Gait belt Activity Tolerance: Patient tolerated treatment well Patient left: in bed;with call bell/phone within reach;with family/visitor present Nurse Communication: Mobility status   GP     Juda Lajeunesse 09/02/2013, 4:44 PM

## 2013-09-02 NOTE — Evaluation (Signed)
Physical Therapy Evaluation Patient Details Name: [name] MRN: [id number] DOB: 01-24-1934 Today's Date: 09/02/2013 Time: 3295-1884 PT Time Calculation (min): 32 min  PT Assessment / Plan / Recommendation History of Present Illness    80 yo with history of CABG + mechanical AVR in 10/13 and permanent atrial fibrillation on warfarin came to the ER today with diffuse weakness and abdominal pain. Until last Friday, patient was at his baseline. He is not very active but had no exertional chest pain or significant exertional dyspnea. On Friday, he was very weak. He slipped and slid to the ground. He was unable to get up. Family got him in bed. Since that time, he has been profoundly weak with minimal ability to ambulate. He has been in bed. On Friday, wife says that he had visual hallucinations though he is not having these anymore. The weakness is generalized with no focality. He also has had upper abdominal pain fairly constantly for 1-2 weeks. It is relatively mild and is localized to the RUQ. He is tender in the RUQ. The ER MD did an abdominal US and said it looked like "chronic cholecystitis." He has had no chest pain. No fever/chills/nausea/diarrhea/cough/congestion. CXR did not show PNA. UA did not show UTI. He was noted to have a mildly elevated troponin level so cardiology was called. No acute changes on ECG (chronic atrial fibrillation).    Clinical Impression  Pt with hx as above presents this date with functional mobility limitations 2* generalized weakness, decreased endurance and mild ambulatory balance deficits    PT Assessment  Patient needs continued PT services    Follow Up Recommendations  Home health PT    Does the patient have the potential to tolerate intense rehabilitation      Barriers to Discharge        Equipment Recommendations  None recommended by PT    Recommendations for Other Services OT consult   Frequency Min 3X/week    Precautions / Restrictions  Precautions Precautions: Fall Precaution Comments: Increased L drift with increased fatigue Restrictions Weight Bearing Restrictions: No   Pertinent Vitals/Pain No specific c/o pain this am.      Mobility  Ambulation/Gait Ambulation Distance (Feet): 400 Feet (and 20' from bathroom) General Gait Details: Pt demonstrating general instability with L drift during ambulation - largely corrected wtih use of RW and cues for pacing, posture and position from RW    Exercises     PT Diagnosis: Difficulty walking  PT Problem List: Decreased strength;Decreased activity tolerance;Decreased balance;Decreased mobility;Decreased knowledge of use of DME;Obesity;Decreased safety awareness PT Treatment Interventions: DME instruction;Gait training;Functional mobility training;Therapeutic activities;Therapeutic exercise;Balance training;Patient/family education     PT Goals(Current goals can be found in the care plan section) Acute Rehab PT Goals Patient Stated Goal: Home to resume previous lifestyle PT Goal Formulation: With patient Time For Goal Achievement: 09/16/13 Potential to Achieve Goals: Good  Visit Information  Last PT Received On: 09/02/13 Assistance Needed: +1       Prior [place] expects to be discharged to:: Private residence Living Arrangements: Spouse/significant other Available Help at Discharge: Family Type of Home: House Home Access: [place]: One [place]: Environmental consultant - 2 wheels Prior Function Level of Independence: Independent;Independent with assistive device(s) Communication Communication: HOH Dominant Hand: Right    Cognition  Cognition Arousal/Alertness: Awake/alert Behavior During Therapy: WFL for tasks assessed/performed Overall Cognitive Status: Within Functional Limits for tasks assessed    Extremity/Trunk Assessment Upper Extremity Assessment  Upper Extremity Assessment: Generalized  weakness Lower Extremity Assessment Lower Extremity Assessment: Generalized weakness   Balance    End of Session PT - End of Session Equipment Utilized During Treatment: Gait belt Activity Tolerance: Patient tolerated treatment well Patient left: in chair;with call bell/phone within reach;with family/visitor present Nurse Communication: Mobility status  GP     [name] 09/02/2013, 12:43 PM

## 2013-09-02 NOTE — Consult Note (Signed)
Reason for Consult: Possible acalculous cholecystitis CC:  Abdominal pain RUQ and progressive weakness Referring Physician: TAT  CHRISTORPHER [name] is an 80 y.o. male.  HPI: Pt was well 2 weeks ago and independently ambulatory.  He has been complaining of pain for 4-5 days, but his wife says it's been going on even longer.  He would complain moving in bed of pain right side.  He tried antacids and this helped some.  Last Friday 08/28/13 he complained of pain and didn't want to get out of bed. By 1/3 he was having trouble getting out of bed, and eventually fell near the bed. His wife helped him back to bed.  Saturday he fell a second time and his wife could not get him up. They had to call family to help get him up off the floor.  Over then next 24-48 hours he got significantly weaker and could not get out of bed to even void.  Family decided to call EMS and transport him to the hospital for evaluation and treatment.  He was eating some during this but not very much.  He has had some nausea but whether it's related to his eating is difficult to discern.   He has been losing weight for 4 months by eating less, and reports 48 pound weight loss during this time. He also has a history of constipation and has not had a BM for 4 days prior to admit on 09/01/13. Work up here in the hospital showed a WBC 11.7, and elevated AST.  He has 4 mildly positive troponin's, Na 130, INR2.83, now down to 2.38.  Abdominal Ultrasound shows GB sludge, and GB wall thickening without evidence of acute cholecystitis, possible chronic cholecystitis. CT scan suggest acalculous cholecystitis, normal post ablation changes on ht e right, no bowel abnormality, BPH.  He has unchanged T12 compression fx with prior cement augmentation. We were ask to see and evaluate for acalculous cholecystitis.  A HIDA scan is ordered for tomorrow.  Past Medical History  Diagnosis Date  CAD s/p CABG and Aortic valve replacement 1. Median sternotomy for aortic  valve replacement (23 mm [facility]),  and;  2. Coronary artery bypass grafting times five (left internal mammary artery  to distal left anterior descending coronary artery, saphenous vein graft  to the first diagonal branch, saphenous vein graft to the medial sub-  branch of the second circumflex marginal branch, sequential saphenous  vein graft to the lateral sub-branch of the second circumflex marginal  branch, and saphenous vein graft to posterior descending coronary  artery).  SURGEON: [name], M.D. 06/16/02  Atrial fibrillation On chronic coumadin  COPD/tobacco use 2 PPD, 20 years, quit in the 1970's  Right renal cell carcinoma with Ablation Dr. Kathlene [name], 2010, and 2013  HTN (hypertension)      Hx of ulcers   Back pain, chronic   Hyperlipidemia   BPH DR. Risa [name]  GERD (gastroesophageal reflux disease)   Arthritis   Constipation     Past Surgical History  Procedure Laterality Date  . Aortic valve replacement    . Coronary artery bypass graft    . Thoracic t12 compression fracture    . Radiofrequency ablation kidney      RIGHT (two surgeries)    Family History  Problem Relation Age of Onset  . Hypertension Sister   . Diabetes Sister   . Hypertension Mother   . Hypertension Brother   . Diabetes Brother   . Diabetes Mother  Social History:  reports that he quit smoking about 35 years ago. His smoking use included Cigarettes. He has a 40 pack-year smoking history. He does not have any smokeless tobacco history on file. He reports that he does not drink alcohol or use illicit drugs.  Allergies: No Known Allergies  Medications:  Prior to Admission:  Prescriptions prior to admission  Medication Sig Dispense Refill  . acetaminophen (TYLENOL) 325 MG tablet Take 650 mg by mouth every 6 (six) hours as needed for mild pain.       [name] amLODipine (NORVASC) 5 MG tablet Take 5 mg by mouth daily.      [name] amoxicillin (AMOXIL) 500 MG capsule Take 500 mg by mouth  as needed (For dental procedures.).       [name] atorvastatin (LIPITOR) 40 MG tablet Take 40 mg by mouth daily.      . carvedilol (COREG) 25 MG tablet Take 25 mg by mouth 2 (two) times daily with a meal.      . Multiple Vitamin (MULTIVITAMIN WITH MINERALS) TABS tablet Take 1 tablet by mouth daily.      . nitroGLYCERIN (NITROSTAT) 0.4 MG SL tablet Place 1 tablet (0.4 mg total) under the tongue every 5 (five) minutes as needed. For chest pain  25 tablet  4  . potassium chloride (KLOR-CON) 8 MEQ CR tablet Take 8 mEq by mouth daily.       . ramipril (ALTACE) 5 MG tablet Take 5 mg by mouth 2 (two) times daily.      . ranitidine (ZANTAC) 150 MG tablet Take 150 mg by mouth 2 (two) times daily as needed for heartburn.       . solifenacin (VESICARE) 10 MG tablet Take 10 mg by mouth daily.       . Tamsulosin HCl (FLOMAX) 0.4 MG CAPS Take 0.4 mg by mouth daily.       [name] torsemide (DEMADEX) 10 MG tablet Take 20 mg by mouth daily.      . vitamin B-12 (CYANOCOBALAMIN) 1000 MCG tablet Take 1,000 mcg by mouth daily.        [name] warfarin (COUMADIN) 5 MG tablet Take 7.5-10 mg by mouth daily. He takes one and a half tablets on Monday, Wednesday, Friday and two tablets the rest of the week.      . digoxin (LANOXIN) 0.125 MG tablet Take 0.125 mg by mouth daily.       Scheduled: . aspirin EC  81 mg Oral Daily  . atorvastatin  40 mg Oral Daily  . carvedilol  12.5 mg Oral BID WC  . darifenacin  15 mg Oral Daily  . digoxin  0.125 mg Oral Daily  . docusate sodium  100 mg Oral BID  . multivitamin with minerals  1 tablet Oral Daily  . pantoprazole (PROTONIX) IV  40 mg Intravenous Q12H  . piperacillin-tazobactam (ZOSYN)  IV  3.375 g Intravenous Q8H  . potassium chloride  10 mEq Oral Daily  . senna  1 tablet Oral BID  . sodium chloride  3 mL Intravenous Q12H  . tamsulosin  0.4 mg Oral Daily  . vitamin B-12  1,000 mcg Oral Daily   Continuous: . sodium chloride 50 mL/hr (09/02/13 1657)   UKG:[id number], acetaminophen,  alum & mag hydroxide-simeth, bisacodyl, HYDROcodone-acetaminophen, nitroGLYCERIN, ondansetron (ZOFRAN) IV, ondansetron, polyethylene glycol Anti-infectives   Start     Dose/Rate Route Frequency Ordered Stop   09/02/13 1800  piperacillin-tazobactam (ZOSYN) IVPB 3.375 g     3.375 g 12.5  mL/hr over 240 Minutes Intravenous Every 8 hours 09/02/13 1719        Results for orders placed during the hospital encounter of 09/01/13 (from the past 48 hour(s))  CBC WITH DIFFERENTIAL     Status: Abnormal   Collection Time    09/01/13 10:55 AM      Result Value Range   WBC 11.7 (*) 4.0 - 10.5 K/uL   RBC 4.11 (*) 4.22 - 5.81 MIL/uL   Hemoglobin 12.2 (*) 13.0 - 17.0 g/dL   HCT 33.8 (*) 39.0 - 52.0 %   MCV 82.2  78.0 - 100.0 fL   MCH 29.7  26.0 - 34.0 pg   MCHC 36.1 (*) 30.0 - 36.0 g/dL   RDW 12.3  11.5 - 15.5 %   Platelets 152  150 - 400 K/uL   Neutrophils Relative % 79 (*) 43 - 77 %   Neutro Abs 9.2 (*) 1.7 - 7.7 K/uL   Lymphocytes Relative 8 (*) 12 - 46 %   Lymphs Abs 0.9  0.7 - 4.0 K/uL   Monocytes Relative 13 (*) 3 - 12 %   Monocytes Absolute 1.6 (*) 0.1 - 1.0 K/uL   Eosinophils Relative 0  0 - 5 %   Eosinophils Absolute 0.0  0.0 - 0.7 K/uL   Basophils Relative 0  0 - 1 %   Basophils Absolute 0.0  0.0 - 0.1 K/uL  COMPREHENSIVE METABOLIC PANEL     Status: Abnormal   Collection Time    09/01/13 10:55 AM      Result Value Range   Sodium 131 (*) 137 - 147 mEq/L   Potassium 4.1  3.7 - 5.3 mEq/L   Chloride 92 (*) 96 - 112 mEq/L   CO2 26  19 - 32 mEq/L   Glucose, Bld 142 (*) 70 - 99 mg/dL   BUN 18  6 - 23 mg/dL   Creatinine, Ser 0.90  0.50 - 1.35 mg/dL   Calcium 9.2  8.4 - 10.5 mg/dL   Total Protein 6.3  6.0 - 8.3 g/dL   Albumin 3.2 (*) 3.5 - 5.2 g/dL   AST 75 (*) 0 - 37 U/L   ALT 36  0 - 53 U/L   Alkaline Phosphatase 90  39 - 117 U/L   Total Bilirubin 1.2  0.3 - 1.2 mg/dL   GFR calc non Af Amer 80 (*) >90 mL/min   GFR calc Af Amer >90  >90 mL/min   Comment: (NOTE)     The eGFR has  been calculated using the CKD EPI equation.     This calculation has not been validated in all clinical situations.     eGFR's persistently <90 mL/min signify possible Chronic Kidney     Disease.  LIPASE, BLOOD     Status: None   Collection Time    09/01/13 10:55 AM      Result Value Range   Lipase 17  11 - 59 U/L  PROTIME-INR     Status: Abnormal   Collection Time    09/01/13 10:55 AM      Result Value Range   Prothrombin Time 28.8 (*) 11.6 - 15.2 seconds   INR 2.83 (*) 0.00 - 1.49  TROPONIN I     Status: Abnormal   Collection Time    09/01/13 10:55 AM      Result Value Range   Troponin I 0.47 (*) <0.30 ng/mL   Comment:  Due to the release kinetics of cTnI,     a negative result within the first hours     of the onset of symptoms does not rule out     myocardial infarction with certainty.     If myocardial infarction is still suspected,     repeat the test at appropriate intervals.     CRITICAL RESULT CALLED TO, READ BACK BY AND VERIFIED WITH:     HAMILTON,J AT 1217 ON 1.6.15 BY POTEAT,S  DIGOXIN LEVEL     Status: Abnormal   Collection Time    09/01/13 10:55 AM      Result Value Range   Digoxin Level <0.3 (*) 0.8 - 2.0 ng/mL  CG4 I-STAT (LACTIC ACID)     Status: None   Collection Time    09/01/13 11:00 AM      Result Value Range   Lactic Acid, Venous 0.87  0.5 - 2.2 mmol/L  URINALYSIS, ROUTINE W REFLEX MICROSCOPIC     Status: Abnormal   Collection Time    09/01/13  1:01 PM      Result Value Range   Color, Urine AMBER (*) YELLOW   Comment: BIOCHEMICALS MAY BE AFFECTED BY COLOR   APPearance CLOUDY (*) CLEAR   Specific Gravity, Urine 1.025  1.005 - 1.030   pH 6.0  5.0 - 8.0   Glucose, UA NEGATIVE  NEGATIVE mg/dL   Hgb urine dipstick TRACE (*) NEGATIVE   Bilirubin Urine SMALL (*) NEGATIVE   Ketones, ur NEGATIVE  NEGATIVE mg/dL   Protein, ur 100 (*) NEGATIVE mg/dL   Urobilinogen, UA 0.2  0.0 - 1.0 mg/dL   Nitrite NEGATIVE  NEGATIVE   Leukocytes, UA  NEGATIVE  NEGATIVE  URINE MICROSCOPIC-ADD ON     Status: Abnormal   Collection Time    09/01/13  1:01 PM      Result Value Range   Squamous Epithelial / LPF RARE  RARE   RBC / HPF 0-2  <3 RBC/hpf   Bacteria, UA RARE  RARE   Casts HYALINE CASTS (*) NEGATIVE  TROPONIN I     Status: Abnormal   Collection Time    09/01/13  2:40 PM      Result Value Range   Troponin I 0.46 (*) <0.30 ng/mL   Comment:            Due to the release kinetics of cTnI,     a negative result within the first hours     of the onset of symptoms does not rule out     myocardial infarction with certainty.     If myocardial infarction is still suspected,     repeat the test at appropriate intervals.     CRITICAL VALUE NOTED.  VALUE IS CONSISTENT WITH PREVIOUSLY REPORTED AND CALLED VALUE.  INFLUENZA PANEL BY PCR     Status: None   Collection Time    09/01/13  5:03 PM      Result Value Range   Influenza A By PCR NEGATIVE  NEGATIVE   Influenza B By PCR NEGATIVE  NEGATIVE   H1N1 flu by pcr NOT DETECTED  NOT DETECTED   Comment:            The Xpert Flu assay (FDA approved for     nasal aspirates or washes and     nasopharyngeal swab specimens), is     intended as an aid in the diagnosis of     influenza and should not be  used as     a sole basis for treatment.     Performed at [facility]  TSH     Status: None   Collection Time    09/01/13  6:05 PM      Result Value Range   TSH 0.875  0.350 - 4.500 uIU/mL   Comment: Performed at [facility]     Status: Abnormal   Collection Time    09/01/13  6:05 PM      Result Value Range   Hemoglobin A1C 5.8 (*) <5.7 %   Comment: (NOTE)                                                                               According to the ADA Clinical Practice Recommendations for 2011, when     HbA1c is used as a screening test:      >=6.5%   Diagnostic of Diabetes Mellitus               (if abnormal result is confirmed)     5.7-6.4%    Increased risk of developing Diabetes Mellitus     References:Diagnosis and Classification of Diabetes Mellitus,Diabetes     XTKW,4097,35(HGDJM 1):S62-S69 and Standards of Medical Care in             Diabetes - 2011,Diabetes Care,2011,34 (Suppl 1):S11-S61.   Mean Plasma Glucose 120 (*) <117 mg/dL   Comment: Performed at [facility]  TROPONIN I     Status: Abnormal   Collection Time    09/01/13  6:05 PM      Result Value Range   Troponin I 0.49 (*) <0.30 ng/mL   Comment:            Due to the release kinetics of cTnI,     a negative result within the first hours     of the onset of symptoms does not rule out     myocardial infarction with certainty.     If myocardial infarction is still suspected,     repeat the test at appropriate intervals.     CRITICAL RESULT CALLED TO, READ BACK BY AND VERIFIED WITH:     SPOKE WITH ADDISON,C RN [phone] [id number] HAMER,N  CORTISOL-AM, BLOOD     Status: None   Collection Time    09/01/13  6:05 PM      Result Value Range   Cortisol - AM 14.1  4.3 - 22.4 ug/dL   Comment: Performed at [facility]  TROPONIN I     Status: Abnormal   Collection Time    09/01/13  8:27 PM      Result Value Range   Troponin I 0.44 (*) <0.30 ng/mL   Comment:            Due to the release kinetics of cTnI,     a negative result within the first hours     of the onset of symptoms does not rule out     myocardial infarction with certainty.     If myocardial infarction is still suspected,     repeat the test at appropriate intervals.     CRITICAL  RESULT CALLED TO, READ BACK BY AND VERIFIED WITH:     SPOKE WITH  ADDISON,C RN [phone] [id number] HAMER,N  COMPREHENSIVE METABOLIC PANEL     Status: Abnormal   Collection Time    09/02/13  5:23 AM      Result Value Range   Sodium 132 (*) 137 - 147 mEq/L   Potassium 4.2  3.7 - 5.3 mEq/L   Chloride 96  96 - 112 mEq/L   CO2 26  19 - 32 mEq/L   Glucose, Bld 117 (*) 70 - 99 mg/dL   BUN 18  6 - 23 mg/dL   Creatinine, Ser  0.90  0.50 - 1.35 mg/dL   Calcium 9.2  8.4 - 10.5 mg/dL   Total Protein 6.0  6.0 - 8.3 g/dL   Albumin 2.9 (*) 3.5 - 5.2 g/dL   AST 78 (*) 0 - 37 U/L   ALT 52  0 - 53 U/L   Alkaline Phosphatase 101  39 - 117 U/L   Total Bilirubin 0.8  0.3 - 1.2 mg/dL   GFR calc non Af Amer 80 (*) >90 mL/min   GFR calc Af Amer >90  >90 mL/min   Comment: (NOTE)     The eGFR has been calculated using the CKD EPI equation.     This calculation has not been validated in all clinical situations.     eGFR's persistently <90 mL/min signify possible Chronic Kidney     Disease.  CBC     Status: Abnormal   Collection Time    09/02/13  5:23 AM      Result Value Range   WBC 9.3  4.0 - 10.5 K/uL   RBC 3.95 (*) 4.22 - 5.81 MIL/uL   Hemoglobin 11.2 (*) 13.0 - 17.0 g/dL   HCT 32.9 (*) 39.0 - 52.0 %   MCV 83.3  78.0 - 100.0 fL   MCH 28.4  26.0 - 34.0 pg   MCHC 34.0  30.0 - 36.0 g/dL   RDW 12.5  11.5 - 15.5 %   Platelets 149 (*) 150 - 400 K/uL  PROTIME-INR     Status: Abnormal   Collection Time    09/02/13  5:23 AM      Result Value Range   Prothrombin Time 25.2 (*) 11.6 - 15.2 seconds   INR 2.38 (*) 0.00 - 1.49  COMPREHENSIVE METABOLIC PANEL     Status: Abnormal   Collection Time    09/02/13  3:23 PM      Result Value Range   Sodium 130 (*) 137 - 147 mEq/L   Potassium 4.4  3.7 - 5.3 mEq/L   Chloride 94 (*) 96 - 112 mEq/L   CO2 26  19 - 32 mEq/L   Glucose, Bld 118 (*) 70 - 99 mg/dL   BUN 15  6 - 23 mg/dL   Creatinine, Ser 0.85  0.50 - 1.35 mg/dL   Calcium 9.2  8.4 - 10.5 mg/dL   Total Protein 6.1  6.0 - 8.3 g/dL   Albumin 3.0 (*) 3.5 - 5.2 g/dL   AST 93 (*) 0 - 37 U/L   ALT 67 (*) 0 - 53 U/L   Alkaline Phosphatase 102  39 - 117 U/L   Total Bilirubin 0.8  0.3 - 1.2 mg/dL   GFR calc non Af Amer 82 (*) >90 mL/min   GFR calc Af Amer >90  >90 mL/min   Comment: (NOTE)     The eGFR has been calculated using  the CKD EPI equation.     This calculation has not been validated in all clinical situations.      eGFR's persistently <90 mL/min signify possible Chronic Kidney     Disease.    Dg Chest 2 View  09/01/2013   CLINICAL DATA:  Weakness. Atrial fibrillation. High blood pressure. Renal cell carcinoma. Prior smoker.  EXAM: CHEST  2 VIEW  COMPARISON:  06/16/2012.  FINDINGS: Post median sternotomy with fracture of upper sternal wires. Post CABG. Heart size top-normal. Calcified mildly tortuous aorta.  T12 compression fracture with prior cement augmentation unchanged. No new compression fracture detected.  No infiltrate, congestive heart failure or pneumothorax.  No plain film evidence of pulmonary malignancy/metastatic disease.  IMPRESSION: Post median sternotomy with fracture of upper sternal wires. Post CABG. Heart size top-normal.  Calcified mildly tortuous aorta.  T12 compression fracture with prior cement augmentation unchanged. No new compression fracture detected.  No infiltrate, congestive heart failure or pneumothorax.  No plain film evidence of pulmonary malignancy/metastatic disease.   Electronically Signed   By: Bridgett Larsson M.D.   On: 09/01/2013 12:22   US Abdomen Complete  09/01/2013   CLINICAL DATA:  Right upper quadrant pain  EXAM: ULTRASOUND ABDOMEN COMPLETE  COMPARISON:  None.  FINDINGS: Gallbladder:  There is mild gallbladder wall thickening to 6 mm. No pericholecystic fluid. There is dependent sludge within the gallbladder. Negative sonographic Murphy's sign.  Common bile duct:  Diameter: Normal diameter 4 mm.  Liver:  No focal lesion identified. Within normal limits in parenchymal echogenicity.  IVC:  No abnormality visualized.  Pancreas:  Visualized portion unremarkable.  Spleen:  Size and appearance within normal limits.  Right Kidney: 12.4cm in length. No evidence of hydronephrosis or stones. Anechoic 1.5 cm cyst in the upper pole. This is proteinaceous nonenhancing cyst on comparison CT.  Left Kidney:  Length: 12.7. Echogenicity within normal limits. No mass or hydronephrosis visualized.   Abdominal aorta:  No aneurysm visualized.  IMPRESSION:  Gallbladder sludge and gallbladder wall thickening without evidence of acute cholecystitis. The findings could represent chronic cholecystitis.   Electronically Signed   By: Genevive Bi M.D.   On: 09/01/2013 12:06   Ct Abdomen Pelvis W Contrast  09/01/2013   CLINICAL DATA:  Mid abdominal discomfort and generalized weakness, history of renal cell malignancy and radiofrequency ablation.  EXAM: CT ABDOMEN AND PELVIS WITH CONTRAST  TECHNIQUE: Multidetector CT imaging of the abdomen and pelvis was performed using the standard protocol following bolus administration of intravenous contrast.  CONTRAST:  OMNIPAQUE IOHEXOL 300 MG/ML  SOLN  COMPARISON:  CT scan of the abdomen dated June 16, 2013.  FINDINGS: The gallbladder is abnormal in appearance. It is mildly distended and exhibits wall thickening and pericholecystic fluid. No calcified or noncalcified stones are demonstrated. The adjacent liver is normal in density and contour. There may be a trace of intrahepatic ductal dilation. The pancreas is somewhat atrophic and exhibits no evidence of inflammatory change or focal mass or ductal dilation. The stomach is moderately distended with food particles. The spleen is not enlarged. There are no adrenal masses  The kidneys exhibit no evidence of obstruction. There are findings consistent with the previous radiofrequency ablation on the right. There is a small hypodense focus medial to the lower pole of the right kidney which is stable. It is demonstrated on image 52 of series 2. It measures 1.6 by a approximately 1 cm. A 2nd cortically based midpole hypodensity on the right is stable and  is most compatible with a cyst. There is a stable 1.5 cm diameter hypodensity in the lateral aspect of the mid to upper pole of the right kidney demonstrated best on image 41 of series 2. In the medial aspect of the mid to upper pole on image 42 of series 2 there is a 1 cm  diameter stable appearing hypodensity. On the left there is no evidence of obstruction or parenchymal masses. A subcentimeter hypodensity in the midpole laterally is consistent with a cyst.  On delayed images contrast within the renal collecting systems is within the limits of normal.  The caliber of the abdominal aorta is normal. The periaortic and pericaval regions also appear normal. The partially contrast filled loops of small and large bowel exhibit no evidence of ileus nor of obstruction. A normal calibered, partially contrast-filled, uninflamed-appearing appendix is demonstrated. Within the pelvis the urinary bladder is mildly distended. The prostate gland is mildly enlarged and produces an impression upon the urinary bladder base. The seminal vesicles and prostate gland appear normal. There is a small fat containing left inguinal hernia. There is a tiny fat containing umbilical hernia.  The right lung base demonstrates very mild subsegmental atelectasis posteriorly. The lumbar vertebral bodies are preserved in height. There is degenerative disc change at multiple lumbar levels. There is partial compression of the body of T12 which appears to have been previously treated with kyphoplasty.  IMPRESSION: 1. The appearance of the gallbladder is abnormal suggesting acalculous cholecystitis. Ultrasound of the gallbladder would be useful. 2. The appearance of both kidneys is stable with no evidence of obstruction. Post ablation changes on the right are present and stable. There are stable hypodensities remaining in the renal cortex of the right kidney. 3. No acute bowel abnormality is demonstrated. 4. There is mild distention of the urinary bladder which may be related to the mildly enlarged prostate gland. Foley catheterization would be useful.   Electronically Signed   By: David  [place]   On: 09/01/2013 19:08    Review of Systems  Constitutional: Positive for weight loss (48 pound loss over the last 4 months,  he was trying to lose weight.) and malaise/fatigue. Negative for fever and chills.  HENT: Negative.   Eyes: Negative.   Respiratory: Positive for shortness of breath. Wheezing: DOE, he doesn't do much at home now.   Cardiovascular: Positive for leg swelling.  Gastrointestinal: Positive for abdominal pain. Negative for heartburn, nausea, vomiting, diarrhea, constipation, blood in stool and melena.  Genitourinary: Negative.        On meds for BPH  Musculoskeletal: Negative.   Skin: Negative.   Neurological: Positive for weakness. Negative for dizziness, sensory change, speech change, focal weakness, seizures and loss of consciousness.  Endo/Heme/Allergies: Bruises/bleeds easily (on chronic coumadin, theraputic on admit).  Psychiatric/Behavioral: Negative.    Blood pressure 122/59, pulse 100, temperature 99.3 F (37.4 C), temperature source Oral, resp. rate 18, height 6\' 4"  (1.93 m), weight 103.6 kg (228 lb 6.3 oz), SpO2 97.00%. Physical Exam  Constitutional: He is oriented to person, place, and time. He appears well-developed and well-nourished. No distress.  Elderly WM NAD  HENT:  Head: Normocephalic and atraumatic.  Nose: Nose normal.  Eyes: Conjunctivae and EOM are normal. Pupils are equal, round, and reactive to light. Right eye exhibits no discharge. Left eye exhibits no discharge. No scleral icterus.  Neck: Normal range of motion. Neck supple. No JVD present. No tracheal deviation present. No thyromegaly present.  Cardiovascular: Normal rate.  Exam reveals  no gallop.   IRREGULAR RHYTHM MEDIASTINAL SCAR mECHANICAL VALVE ON ASCULTATION   Respiratory: Effort normal and breath sounds normal. No respiratory distress. He has no wheezes. He has no rales. He exhibits no tenderness.  GI: Soft. Bowel sounds are normal. He exhibits no distension and no mass. There is tenderness (SOME IN THE ruq). There is no rebound and no guarding.  Musculoskeletal: He exhibits no edema.  Neurological: He  is alert and oriented to person, place, and time. No cranial nerve deficit.  Skin: Skin is warm and dry. No rash noted. He is not diaphoretic. No erythema. No pallor.  Psychiatric: He has a normal mood and affect. His behavior is normal. Judgment and thought content normal.  Memory is slightly diminished, wife and daughter correcting him.    Assessment/Plan: 1.  Abdominal pain and weakness/mild LFT elevation, rule out cholecystitis. 2.  S/p CABG and AVR with [facility] Jud[facility] valve 10/212/03 (4 positive troponin's on admit) 3.  Chronic Atrial fibrillation on chronic coumadin (INR 2.38) 4.  COPD/hx of tobacco use 5.  Right renal cell cancer with ablation 2010, and 2013. 6.  Weight loss 7.  Hyponatremia 8.  Hypertension 9.  Hx of ulcers/GERD 10. Hyperlipidemia 11. BPH 12.Chronic constipation 13. Arthritis/T12 compression fracture.  Plan:  Agree with HIDA. Add zosyn for antibiotic coverage.  Agree with conservative management currently.  If the HIDA is positive, we will need to discuss with Medicine.  Is he a candidate for surgery, does this explain his profound weakness.  Would it be better to place a drain, and come back at another time, to remove gallbladder.  He has a number of issues to be resolved prior to any surgical decision.  We will follow with you.  [name] 09/02/2013, 5:19 PM

## 2013-09-02 NOTE — Progress Notes (Deleted)
  Echocardiogram 2D Echocardiogram has been performed.  Diamond Nickel 09/02/2013, 3:59 PM

## 2013-09-02 NOTE — Progress Notes (Signed)
Patient ID: [name], male   DOB: 04-10-1934, 80 y.o.   MRN: [id number]   SUBJECTIVE: Abdominal pain is better.  Legs still weak but able to get to bathroom with assistance.  No chest pain.  Troponin with stable mild elevation around 0.4.    [name] aspirin EC  81 mg Oral Daily  . atorvastatin  40 mg Oral Daily  . carvedilol  12.5 mg Oral BID WC  . darifenacin  15 mg Oral Daily  . digoxin  0.125 mg Oral Daily  . docusate sodium  100 mg Oral BID  . multivitamin with minerals  1 tablet Oral Daily  . pantoprazole (PROTONIX) IV  40 mg Intravenous Q12H  . potassium chloride  10 mEq Oral Daily  . senna  1 tablet Oral BID  . sodium chloride  3 mL Intravenous Q12H  . tamsulosin  0.4 mg Oral Daily  . vitamin B-12  1,000 mcg Oral Daily     Filed Vitals:   09/01/13 1515 09/01/13 1700 09/01/13 2223 09/02/13 0557  BP: 84/34 138/65 132/64 106/85  Pulse: 71 119 71 95  Temp: 98.7 F (37.1 C) 97.5 F (36.4 C) 98.4 F (36.9 C) 98.1 F (36.7 C)  TempSrc: Oral Oral Oral Oral  Resp: 17 18 20 20   Height:  6\' 4"  (1.93 m)    Weight:  103.6 kg (228 lb 6.3 oz)    SpO2: 98% 99% 97% 98%    Intake/Output Summary (Last 24 hours) at 09/02/13 0825 Last data filed at 09/02/13 0700  Gross per 24 hour  Intake 761.67 ml  Output    250 ml  Net 511.67 ml    LABS: Basic Metabolic Panel:  Recent Labs  09/01/13 1055 09/02/13 0523  NA 131* 132*  K 4.1 4.2  CL 92* 96  CO2 26 26  GLUCOSE 142* 117*  BUN 18 18  CREATININE 0.90 0.90  CALCIUM 9.2 9.2   Liver Function Tests:  Recent Labs  09/01/13 1055 09/02/13 0523  AST 75* 78*  ALT 36 52  ALKPHOS 90 101  BILITOT 1.2 0.8  PROT 6.3 6.0  ALBUMIN 3.2* 2.9*    Recent Labs  09/01/13 1055  LIPASE 17   CBC:  Recent Labs  09/01/13 1055 09/02/13 0523  WBC 11.7* 9.3  NEUTROABS 9.2*  --   HGB 12.2* 11.2*  HCT 33.8* 32.9*  MCV 82.2 83.3  PLT 152 149*   Cardiac Enzymes:  Recent Labs  09/01/13 1440 09/01/13 1805 09/01/13 2027    TROPONINI 0.46* 0.49* 0.44*   BNP: No components found with this basename: POCBNP,  D-Dimer: No results found for this basename: DDIMER,  in the last 72 hours Hemoglobin A1C:  Recent Labs  09/01/13 1805  HGBA1C 5.8*   Fasting Lipid Panel: No results found for this basename: CHOL, HDL, LDLCALC, TRIG, CHOLHDL, LDLDIRECT,  in the last 72 hours Thyroid Function Tests:  Recent Labs  09/01/13 1805  TSH 0.875   Anemia Panel: No results found for this basename: VITAMINB12, FOLATE, FERRITIN, TIBC, IRON, RETICCTPCT,  in the last 72 hours  RADIOLOGY: Dg Chest 2 View  09/01/2013   CLINICAL DATA:  Weakness. Atrial fibrillation. High blood pressure. Renal cell carcinoma. Prior smoker.  EXAM: CHEST  2 VIEW  COMPARISON:  06/16/2012.  FINDINGS: Post median sternotomy with fracture of upper sternal wires. Post CABG. Heart size top-normal. Calcified mildly tortuous aorta.  T12 compression fracture with prior cement augmentation unchanged. No new compression fracture detected.  No  infiltrate, congestive heart failure or pneumothorax.  No plain film evidence of pulmonary malignancy/metastatic disease.  IMPRESSION: Post median sternotomy with fracture of upper sternal wires. Post CABG. Heart size top-normal.  Calcified mildly tortuous aorta.  T12 compression fracture with prior cement augmentation unchanged. No new compression fracture detected.  No infiltrate, congestive heart failure or pneumothorax.  No plain film evidence of pulmonary malignancy/metastatic disease.   Electronically Signed   By: Chauncey Cruel M.D.   On: 09/01/2013 12:22   US Abdomen Complete  09/01/2013   CLINICAL DATA:  Right upper quadrant pain  EXAM: ULTRASOUND ABDOMEN COMPLETE  COMPARISON:  None.  FINDINGS: Gallbladder:  There is mild gallbladder wall thickening to 6 mm. No pericholecystic fluid. There is dependent sludge within the gallbladder. Negative sonographic Murphy's sign.  Common bile duct:  Diameter: Normal diameter 4 mm.   Liver:  No focal lesion identified. Within normal limits in parenchymal echogenicity.  IVC:  No abnormality visualized.  Pancreas:  Visualized portion unremarkable.  Spleen:  Size and appearance within normal limits.  Right Kidney: 12.4cm in length. No evidence of hydronephrosis or stones. Anechoic 1.5 cm cyst in the upper pole. This is proteinaceous nonenhancing cyst on comparison CT.  Left Kidney:  Length: 12.7. Echogenicity within normal limits. No mass or hydronephrosis visualized.  Abdominal aorta:  No aneurysm visualized.  IMPRESSION:  Gallbladder sludge and gallbladder wall thickening without evidence of acute cholecystitis. The findings could represent chronic cholecystitis.   Electronically Signed   By: Suzy Bouchard M.D.   On: 09/01/2013 12:06   Ct Abdomen Pelvis W Contrast  09/01/2013   CLINICAL DATA:  Mid abdominal discomfort and generalized weakness, history of renal cell malignancy and radiofrequency ablation.  EXAM: CT ABDOMEN AND PELVIS WITH CONTRAST  TECHNIQUE: Multidetector CT imaging of the abdomen and pelvis was performed using the standard protocol following bolus administration of intravenous contrast.  CONTRAST:  158mL OMNIPAQUE IOHEXOL 300 MG/ML  SOLN  COMPARISON:  CT scan of the abdomen dated June 16, 2013.  FINDINGS: The gallbladder is abnormal in appearance. It is mildly distended and exhibits wall thickening and pericholecystic fluid. No calcified or noncalcified stones are demonstrated. The adjacent liver is normal in density and contour. There may be a trace of intrahepatic ductal dilation. The pancreas is somewhat atrophic and exhibits no evidence of inflammatory change or focal mass or ductal dilation. The stomach is moderately distended with food particles. The spleen is not enlarged. There are no adrenal masses  The kidneys exhibit no evidence of obstruction. There are findings consistent with the previous radiofrequency ablation on the right. There is a small hypodense  focus medial to the lower pole of the right kidney which is stable. It is demonstrated on image 52 of series 2. It measures 1.6 by a approximately 1 cm. A 2nd cortically based midpole hypodensity on the right is stable and is most compatible with a cyst. There is a stable 1.5 cm diameter hypodensity in the lateral aspect of the mid to upper pole of the right kidney demonstrated best on image 41 of series 2. In the medial aspect of the mid to upper pole on image 42 of series 2 there is a 1 cm diameter stable appearing hypodensity. On the left there is no evidence of obstruction or parenchymal masses. A subcentimeter hypodensity in the midpole laterally is consistent with a cyst.  On delayed images contrast within the renal collecting systems is within the limits of normal.  The caliber of the  abdominal aorta is normal. The periaortic and pericaval regions also appear normal. The partially contrast filled loops of small and large bowel exhibit no evidence of ileus nor of obstruction. A normal calibered, partially contrast-filled, uninflamed-appearing appendix is demonstrated. Within the pelvis the urinary bladder is mildly distended. The prostate gland is mildly enlarged and produces an impression upon the urinary bladder base. The seminal vesicles and prostate gland appear normal. There is a small fat containing left inguinal hernia. There is a tiny fat containing umbilical hernia.  The right lung base demonstrates very mild subsegmental atelectasis posteriorly. The lumbar vertebral bodies are preserved in height. There is degenerative disc change at multiple lumbar levels. There is partial compression of the body of T12 which appears to have been previously treated with kyphoplasty.  IMPRESSION: 1. The appearance of the gallbladder is abnormal suggesting acalculous cholecystitis. Ultrasound of the gallbladder would be useful. 2. The appearance of both kidneys is stable with no evidence of obstruction. Post ablation  changes on the right are present and stable. There are stable hypodensities remaining in the renal cortex of the right kidney. 3. No acute bowel abnormality is demonstrated. 4. There is mild distention of the urinary bladder which may be related to the mildly enlarged prostate gland. Foley catheterization would be useful.   Electronically Signed   By: David  [place]ique   On: 09/01/2013 19:08    PHYSICAL EXAM General: NAD Neck: JVP 8 cm, no thyromegaly or thyroid nodule.  Lungs: Clear to auscultation bilaterally with normal respiratory effort. CV: Nondisplaced PMI.  Heart regular S1/S2, mechanical S2, no S3/S4, 2/6 SEM.  1+ ankle edema. Abdomen: Soft, mild RUQ tenderness (improved), no hepatosplenomegaly, no distention.  Neurologic: Alert and oriented x 3.  Psych: Normal affect. Extremities: No clubbing or cyanosis.   TELEMETRY: Reviewed telemetry pt in atrial fibrillation  ASSESSMENT AND PLAN: 80 yo with history of CABG + mechanical AVR in 10/13 and permanent atrial fibrillation on warfarin came to the ER today with diffuse weakness and abdominal pain. As part of his workup, he was found to have a mildly elevated troponin.   CT abdomen showed signs of acalculous cholecystitis. 1. Generalized weakness: No focality. Has been unable to get out of bed since Friday. Influenza negative.  ? Related to gallbladder inflammation/acalculous cholecystitis.  Need PT to see. 2. Abdominal pain: Centered in RUQ, no peritoneal signs.  Pain improved today.  CT abdomen suggestive of acalculous cholecystitis.  Per [facility], suspect surgical evaluation needed.  3. Atrial fibrillation: Permanent. HR controlled on Coreg.  - Continue Coreg but with lowish BP would lower dose to 12.5 mg bid.  - Hold coumadin for now in case procedure needed, cover with heparin when INR < 2. Restart coumadin if no procedure needed.  4. CAD: h/o CABG with AVR. No chest pain or ischemic ECG changes. Elevated troponin (mildly) in 0.4 range,  steady with no up-trend. I think that this likely represents demand ischemia likely in setting of inflammatory process (acalculous cholecystitis).  BP has also been low at times.   - heparin gtt when INR < 2.  - ASA 81, statin, Coreg.  - Will get an echo.  No plan for LHC.  If patient needs surgery on gallbladder, should likely have Cardiolite prior to operation.   5. HTN: BP has been running on the low side. Held amlodipine and decreased Coreg.  Will check orthostatics.  6. Mechanical aortic valve: Stable on recent echo. Continue ASA 81, heparin gtt when INR <  2.   Loralie Champagne 09/02/2013 8:32 AM

## 2013-09-02 NOTE — Evaluation (Signed)
Occupational Therapy Evaluation Patient Details Name: [name] MRN: [id number] DOB: 28-Feb-1934 Today's Date: 09/02/2013 Time: 3151-7616 OT Time Calculation (min): 15 min  OT Assessment / Plan / Recommendation History of present illness 80 yo with history of CABG + mechanical AVR in 10/13 and permanent atrial fibrillation on warfarin came to the ER today with diffuse weakness and abdominal pain. Until last Friday, patient was at his baseline. He is not very active but had no exertional chest pain or significant exertional dyspnea. On Friday, he was very weak. He slipped and slid to the ground. He was unable to get up. Family got him in bed. Since that time, he has been profoundly weak with minimal ability to ambulate. He has been in bed. On Friday, wife says that he had visual hallucinations though he is not having these anymore. The weakness is generalized with no focality. He also has had upper abdominal pain fairly constantly for 1-2 weeks. It is relatively mild and is localized to the RUQ. He is tender in the RUQ. The ER MD did an abdominal US and said it looked like "chronic cholecystitis." He has had no chest pain. No fever/chills/nausea/diarrhea/cough/congestion. CXR did not show PNA. UA did not show UTI. He was noted to have a mildly elevated troponin level so cardiology was called. No acute changes on ECG (chronic atrial fibrillation).     Clinical Impression   Pt presents to OT with decreased I with ADL activity due to problems listed below. Pt will benefit from skilled OT to increase I with ADL activity and return to PLOF    OT Assessment  Patient needs continued OT Services    Follow Up Recommendations  Home health OT       Equipment Recommendations  None recommended by OT       Frequency  Min 2X/week    Precautions / Restrictions Precautions Precautions: Fall Precaution Comments: Increased L drift with increased fatigue Restrictions Weight Bearing Restrictions: No    Pertinent Vitals/Pain     ADL  Grooming: Min guard Where Assessed - Grooming: Supported standing Upper Body Bathing: Set up Where Assessed - Upper Body Bathing: Unsupported sitting Lower Body Bathing: Moderate assistance Where Assessed - Lower Body Bathing: Unsupported sit to stand Upper Body Dressing: Minimal assistance Where Assessed - Upper Body Dressing: Unsupported sitting Lower Body Dressing: Moderate assistance Where Assessed - Lower Body Dressing: Supported sit to Lobbyist: Minimal assistance Toilet Transfer Method: Sit to stand    OT Diagnosis: Generalized weakness  OT Problem List: Decreased strength;Decreased activity tolerance;Decreased safety awareness OT Treatment Interventions: Self-care/ADL training;Patient/family education   OT Goals(Current goals can be found in the care plan section) Acute Rehab OT Goals Patient Stated Goal: Home to resume previous lifestyle  Visit Information  Assistance Needed: +1 History of Present Illness: 80 yo with history of CABG + mechanical AVR in 10/13 and permanent atrial fibrillation on warfarin came to the ER today with diffuse weakness and abdominal pain. Until last Friday, patient was at his baseline. He is not very active but had no exertional chest pain or significant exertional dyspnea. On Friday, he was very weak. He slipped and slid to the ground. He was unable to get up. Family got him in bed. Since that time, he has been profoundly weak with minimal ability to ambulate. He has been in bed. On Friday, wife says that he had visual hallucinations though he is not having these anymore. The weakness is generalized with no focality. He  also has had upper abdominal pain fairly constantly for 1-2 weeks. It is relatively mild and is localized to the RUQ. He is tender in the RUQ. The ER MD did an abdominal US and said it looked like "chronic cholecystitis." He has had no chest pain. No  fever/chills/nausea/diarrhea/cough/congestion. CXR did not show PNA. UA did not show UTI. He was noted to have a mildly elevated troponin level so cardiology was called. No acute changes on ECG (chronic atrial fibrillation).         Prior [place] expects to be discharged to:: Private residence Living Arrangements: Spouse/significant other Available Help at Discharge: Family Type of Home: House Home Access: [place]: [place]: Environmental consultant - 2 wheels Prior Function Level of Independence: Independent;Independent with assistive device(s) Communication Communication: HOH Dominant Hand: Right         Vision/Perception Vision - History Patient Visual Report: No change from baseline   Cognition  Cognition Arousal/Alertness: Awake/alert Behavior During Therapy: WFL for tasks assessed/performed Overall Cognitive Status: Within Functional Limits for tasks assessed    Extremity/Trunk Assessment Upper Extremity Assessment Upper Extremity Assessment: Generalized weakness Lower Extremity Assessment Lower Extremity Assessment: Generalized weakness     Mobility Bed Mobility Overal bed mobility: Modified Independent Transfers Overall transfer level: Needs assistance Transfers: Sit to/from Stand Sit to Stand: Min assist;Min guard General transfer comment: cues for transition position and use of UEs to self assist; Min assist to initially balance in standing           End of Session OT - End of Session Activity Tolerance: Patient limited by fatigue Patient left: in bed with wife present and call bell with in reach  [place], Thereasa Parkin 09/02/2013, 1:36 PM

## 2013-09-02 NOTE — Progress Notes (Signed)
[facility] PROGRESS NOTE  [name] JOI:[id number] DOB: 15-Sep-1933 DOA: 09/01/2013 PCP: Gennette Pac, MD  Assessment/Plan: Abdominal pain/Transaminasemia -AST mildly elevated, but suspect this may be due to NASH.  -Lipase and other liver functions tests normal. Lactic acid neg. UA neg. Patient was not constipated or having diarrhea when symptoms started.  -concerned about cholecystitis -patient also has history of RCC s/p two radiofrequency ablations.  - CT abd/pelvis--gallbladder wall thickening with pericholecystic fluid and trace biliary ductal dilatation  - General surgery consultation given suspicion of chronic cholecystitis on [place].  -HIDA to eval for gallbladder EF  - Zofran prn nausea  - Dig level: < 0.3  Progressive weakness,  - Multifactorial including deconditioning as well as his infectious process and other electrolyte arrangements -Possible mild dehydration with borderline elevated BUN:cr  - IVF  - PT/OT evaluation  - TSH--0.875 - Cortisol 14.1 Hypotension in ER  - Hold norvasc, ACEI  - Hold parameter for BB  - NS bolus x 519mL and gentle hydratino  CAD with mild elevation of troponin,  -s/p CABG/AVR,  -likely demand ischemia - Telemetry  - ASA, statin, BB  - Cardiology following  Permanent atrial fibrillation -on A/C and rate controlled on BB and dig  - Heparin gtt with INR < 2  Mechanical AVR -IV heparin when INR <2 if surgery is anticipated GERD,  -possible acid-reflux like symptoms  - Start PPI  Leukocytosis -Due to acute medical condition -Improved -Afebrile and hemodynamically stable  Family Communication:   wife at beside Disposition Plan:   Home when medically stable       Procedures/Studies: Dg Chest 2 View  09/01/2013   CLINICAL DATA:  Weakness. Atrial fibrillation. High blood pressure. Renal cell carcinoma. Prior smoker.  EXAM: CHEST  2 VIEW  COMPARISON:  06/16/2012.  FINDINGS: Post median sternotomy with fracture of upper  sternal wires. Post CABG. Heart size top-normal. Calcified mildly tortuous aorta.  T12 compression fracture with prior cement augmentation unchanged. No new compression fracture detected.  No infiltrate, congestive heart failure or pneumothorax.  No plain film evidence of pulmonary malignancy/metastatic disease.  IMPRESSION: Post median sternotomy with fracture of upper sternal wires. Post CABG. Heart size top-normal.  Calcified mildly tortuous aorta.  T12 compression fracture with prior cement augmentation unchanged. No new compression fracture detected.  No infiltrate, congestive heart failure or pneumothorax.  No plain film evidence of pulmonary malignancy/metastatic disease.   Electronically Signed   By: Chauncey Cruel M.D.   On: 09/01/2013 12:22   US Abdomen Complete  09/01/2013   CLINICAL DATA:  Right upper quadrant pain  EXAM: ULTRASOUND ABDOMEN COMPLETE  COMPARISON:  None.  FINDINGS: Gallbladder:  There is mild gallbladder wall thickening to 6 mm. No pericholecystic fluid. There is dependent sludge within the gallbladder. Negative sonographic Murphy's sign.  Common bile duct:  Diameter: Normal diameter 4 mm.  Liver:  No focal lesion identified. Within normal limits in parenchymal echogenicity.  IVC:  No abnormality visualized.  Pancreas:  Visualized portion unremarkable.  Spleen:  Size and appearance within normal limits.  Right Kidney: 12.4cm in length. No evidence of hydronephrosis or stones. Anechoic 1.5 cm cyst in the upper pole. This is proteinaceous nonenhancing cyst on comparison CT.  Left Kidney:  Length: 12.7. Echogenicity within normal limits. No mass or hydronephrosis visualized.  Abdominal aorta:  No aneurysm visualized.  IMPRESSION:  Gallbladder sludge and gallbladder wall thickening without evidence of acute cholecystitis. The findings could represent chronic cholecystitis.   Electronically Signed  By: Suzy Bouchard M.D.   On: 09/01/2013 12:06   Ct Abdomen Pelvis W Contrast  09/01/2013    CLINICAL DATA:  Mid abdominal discomfort and generalized weakness, history of renal cell malignancy and radiofrequency ablation.  EXAM: CT ABDOMEN AND PELVIS WITH CONTRAST  TECHNIQUE: Multidetector CT imaging of the abdomen and pelvis was performed using the standard protocol following bolus administration of intravenous contrast.  CONTRAST:  153mL OMNIPAQUE IOHEXOL 300 MG/ML  SOLN  COMPARISON:  CT scan of the abdomen dated June 16, 2013.  FINDINGS: The gallbladder is abnormal in appearance. It is mildly distended and exhibits wall thickening and pericholecystic fluid. No calcified or noncalcified stones are demonstrated. The adjacent liver is normal in density and contour. There may be a trace of intrahepatic ductal dilation. The pancreas is somewhat atrophic and exhibits no evidence of inflammatory change or focal mass or ductal dilation. The stomach is moderately distended with food particles. The spleen is not enlarged. There are no adrenal masses  The kidneys exhibit no evidence of obstruction. There are findings consistent with the previous radiofrequency ablation on the right. There is a small hypodense focus medial to the lower pole of the right kidney which is stable. It is demonstrated on image 52 of series 2. It measures 1.6 by a approximately 1 cm. A 2nd cortically based midpole hypodensity on the right is stable and is most compatible with a cyst. There is a stable 1.5 cm diameter hypodensity in the lateral aspect of the mid to upper pole of the right kidney demonstrated best on image 41 of series 2. In the medial aspect of the mid to upper pole on image 42 of series 2 there is a 1 cm diameter stable appearing hypodensity. On the left there is no evidence of obstruction or parenchymal masses. A subcentimeter hypodensity in the midpole laterally is consistent with a cyst.  On delayed images contrast within the renal collecting systems is within the limits of normal.  The caliber of the abdominal aorta  is normal. The periaortic and pericaval regions also appear normal. The partially contrast filled loops of small and large bowel exhibit no evidence of ileus nor of obstruction. A normal calibered, partially contrast-filled, uninflamed-appearing appendix is demonstrated. Within the pelvis the urinary bladder is mildly distended. The prostate gland is mildly enlarged and produces an impression upon the urinary bladder base. The seminal vesicles and prostate gland appear normal. There is a small fat containing left inguinal hernia. There is a tiny fat containing umbilical hernia.  The right lung base demonstrates very mild subsegmental atelectasis posteriorly. The lumbar vertebral bodies are preserved in height. There is degenerative disc change at multiple lumbar levels. There is partial compression of the body of T12 which appears to have been previously treated with kyphoplasty.  IMPRESSION: 1. The appearance of the gallbladder is abnormal suggesting acalculous cholecystitis. Ultrasound of the gallbladder would be useful. 2. The appearance of both kidneys is stable with no evidence of obstruction. Post ablation changes on the right are present and stable. There are stable hypodensities remaining in the renal cortex of the right kidney. 3. No acute bowel abnormality is demonstrated. 4. There is mild distention of the urinary bladder which may be related to the mildly enlarged prostate gland. Foley catheterization would be useful.   Electronically Signed   By: Saulo Anthis  [place]   On: 09/01/2013 19:08         Subjective: Patient is a poor historian. He continues to have intermittent  epigastric and right upper quadrant pain. Denies any vomiting today. Denies chest discomfort, shortness breath, dizziness, dysuria, hematuria. No dizziness or syncope.  Objective: Filed Vitals:   09/01/13 1515 09/01/13 1700 09/01/13 2223 09/02/13 0557  BP: 84/34 138/65 132/64 106/85  Pulse: 71 119 71 95  Temp: 98.7 F (37.1 C)  97.5 F (36.4 C) 98.4 F (36.9 C) 98.1 F (36.7 C)  TempSrc: Oral Oral Oral Oral  Resp: 17 18 20 20   Height:  6\' 4"  (1.93 m)    Weight:  103.6 kg (228 lb 6.3 oz)    SpO2: 98% 99% 97% 98%    Intake/Output Summary (Last 24 hours) at 09/02/13 1456 Last data filed at 09/02/13 0900  Gross per 24 hour  Intake 1121.67 ml  Output    250 ml  Net 871.67 ml   Weight change:  Exam:   General:  Pt is alert, follows commands appropriately, not in acute distress  HEENT: No icterus, No thrush,[place]/AT  Cardiovascular: RRR, S1/S2, no rubs, no gallops  Respiratory: CTA bilaterally, no wheezing, no crackles, no rhonchi  Abdomen: Soft/+BS, epigastric and right upper quadrant tender without any pitting outside, non distended, no guarding  Extremities: 2+ LE edema, No lymphangitis, No petechiae, No rashes, no synovitis  Data Reviewed: Basic Metabolic Panel:  Recent Labs Lab 09/01/13 1055 09/02/13 0523  NA 131* 132*  K 4.1 4.2  CL 92* 96  CO2 26 26  GLUCOSE 142* 117*  BUN 18 18  CREATININE 0.90 0.90  CALCIUM 9.2 9.2   Liver Function Tests:  Recent Labs Lab 09/01/13 1055 09/02/13 0523  AST 75* 78*  ALT 36 52  ALKPHOS 90 101  BILITOT 1.2 0.8  PROT 6.3 6.0  ALBUMIN 3.2* 2.9*    Recent Labs Lab 09/01/13 1055  LIPASE 17   No results found for this basename: AMMONIA,  in the last 168 hours CBC:  Recent Labs Lab 09/01/13 1055 09/02/13 0523  WBC 11.7* 9.3  NEUTROABS 9.2*  --   HGB 12.2* 11.2*  HCT 33.8* 32.9*  MCV 82.2 83.3  PLT 152 149*   Cardiac Enzymes:  Recent Labs Lab 09/01/13 1055 09/01/13 1440 09/01/13 1805 09/01/13 2027  TROPONINI 0.47* 0.46* 0.49* 0.44*   BNP: No components found with this basename: POCBNP,  CBG: No results found for this basename: GLUCAP,  in the last 168 hours  No results found for this or any previous visit (from the past 240 hour(s)).   Scheduled Meds: . aspirin EC  81 mg Oral Daily  . atorvastatin  40 mg Oral Daily  .  carvedilol  12.5 mg Oral BID WC  . darifenacin  15 mg Oral Daily  . digoxin  0.125 mg Oral Daily  . docusate sodium  100 mg Oral BID  . multivitamin with minerals  1 tablet Oral Daily  . pantoprazole (PROTONIX) IV  40 mg Intravenous Q12H  . potassium chloride  10 mEq Oral Daily  . senna  1 tablet Oral BID  . sodium chloride  3 mL Intravenous Q12H  . tamsulosin  0.4 mg Oral Daily  . vitamin B-12  1,000 mcg Oral Daily   Continuous Infusions: . sodium chloride 50 mL/hr at 09/01/13 1810     Tiye Huwe, DO  [facility] Pager [phone]  If 7PM-7AM, please contact night-coverage [email or web address] Password [email or web address] 09/02/2013, 2:56 PM   LOS: 1 day

## 2013-09-03 ENCOUNTER — Inpatient Hospital Stay (HOSPITAL_COMMUNITY): Payer: Medicare Other

## 2013-09-03 ENCOUNTER — Encounter (HOSPITAL_COMMUNITY): Payer: Self-pay

## 2013-09-03 DIAGNOSIS — I059 Rheumatic mitral valve disease, unspecified: Secondary | ICD-10-CM

## 2013-09-03 LAB — CBC
HCT: 32.7 % — ABNORMAL LOW (ref 39.0–52.0)
HEMOGLOBIN: 11.3 g/dL — AB (ref 13.0–17.0)
MCH: 28.9 pg (ref 26.0–34.0)
MCHC: 34.6 g/dL (ref 30.0–36.0)
MCV: 83.6 fL (ref 78.0–100.0)
Platelets: 144 10*3/uL — ABNORMAL LOW (ref 150–400)
RBC: 3.91 MIL/uL — AB (ref 4.22–5.81)
RDW: 12.5 % (ref 11.5–15.5)
WBC: 7 10*3/uL (ref 4.0–10.5)

## 2013-09-03 LAB — HEPARIN LEVEL (UNFRACTIONATED): Heparin Unfractionated: <0.1 IU/mL — ABNORMAL LOW (ref 0.30–0.70)

## 2013-09-03 LAB — COMPREHENSIVE METABOLIC PANEL
ALT: 76 U/L — ABNORMAL HIGH (ref 0–53)
AST: 89 U/L — ABNORMAL HIGH (ref 0–37)
Albumin: 2.9 g/dL — ABNORMAL LOW (ref 3.5–5.2)
Alkaline Phosphatase: 112 U/L (ref 39–117)
BUN: 12 mg/dL (ref 6–23)
CALCIUM: 9.4 mg/dL (ref 8.4–10.5)
CO2: 24 meq/L (ref 19–32)
Chloride: 96 mEq/L (ref 96–112)
Creatinine, Ser: 0.81 mg/dL (ref 0.50–1.35)
GFR, EST NON AFRICAN AMERICAN: 84 mL/min — AB (ref >90)
GLUCOSE: 103 mg/dL — AB (ref 70–99)
Potassium: 4.4 mEq/L (ref 3.7–5.3)
Sodium: 132 mEq/L — ABNORMAL LOW (ref 137–147)
Total Bilirubin: 0.7 mg/dL (ref 0.3–1.2)
Total Protein: 6 g/dL (ref 6.0–8.3)

## 2013-09-03 LAB — PREALBUMIN: PREALBUMIN: 9.5 mg/dL — AB (ref 17.0–34.0)

## 2013-09-03 LAB — PROTIME-INR
INR: 1.96 — AB (ref 0.00–1.49)
PROTHROMBIN TIME: 21.7 s — AB (ref 11.6–15.2)

## 2013-09-03 LAB — GLUCOSE, CAPILLARY
GLUCOSE-CAPILLARY: 110 mg/dL — AB (ref 70–99)
Glucose-Capillary: 134 mg/dL — ABNORMAL HIGH (ref 70–99)

## 2013-09-03 MED ORDER — HEPARIN (PORCINE) IN NACL 100-0.45 UNIT/ML-% IJ SOLN
1400.0000 [IU]/h | INTRAMUSCULAR | Status: DC
  Administered 2013-09-04: 07:00:00 1400 [IU]/h via INTRAVENOUS
  Filled 2013-09-03 (×2): qty 250

## 2013-09-03 MED ORDER — MORPHINE SULFATE 4 MG/ML IJ SOLN
INTRAMUSCULAR | Status: AC
  Administered 2013-09-03: 4 mg via INTRAVENOUS
  Filled 2013-09-03: qty 1

## 2013-09-03 MED ORDER — TECHNETIUM TC 99M MEBROFENIN IV KIT
5.1000 | PACK | Freq: Once | INTRAVENOUS | Status: AC | PRN
  Administered 2013-09-03: 10:00:00 5.1 via INTRAVENOUS

## 2013-09-03 MED ORDER — CARVEDILOL 25 MG PO TABS
25.0000 mg | ORAL_TABLET | Freq: Two times a day (BID) | ORAL | Status: DC
  Administered 2013-09-03 – 2013-09-10 (×14): 25 mg via ORAL
  Filled 2013-09-03 (×19): qty 1

## 2013-09-03 MED ORDER — MORPHINE SULFATE 4 MG/ML IJ SOLN
4.0000 mg | Freq: Once | INTRAMUSCULAR | Status: AC
  Administered 2013-09-03: 4 mg via INTRAVENOUS

## 2013-09-03 MED ORDER — HEPARIN (PORCINE) IN NACL 100-0.45 UNIT/ML-% IJ SOLN
1200.0000 [IU]/h | INTRAMUSCULAR | Status: DC
Start: 2013-09-03 — End: 2013-09-03
  Administered 2013-09-03: 1200 [IU]/h via INTRAVENOUS
  Filled 2013-09-03 (×2): qty 250

## 2013-09-03 NOTE — Consult Note (Signed)
Seen and agree  

## 2013-09-03 NOTE — Progress Notes (Signed)
PHARMACIST - PHYSICIAN COMMUNICATION CONCERNING:  Heparin  Please see pharmacist note earlier today for full details.  In brief, 76 yoM with mechanical AVR on chronic coumadin now with possible cholecystitis.  Coumadin placed on hold and IV heparin bridge started today for possible surgery.    1st HL = <0.10, subtherapeutic (goal 0.3-0.7).   CBC this AM, plts and hgb slightly low No bleeding or infusion issues per RN  RECOMMENDATION: Increase to heparin infusion rate 1400 units/hr = 14 ml/hr.   Recheck in 8 hours  Ralene Bathe, PharmD, BCPS 09/03/2013, 8:09 PM  Pager: [phone]

## 2013-09-03 NOTE — Progress Notes (Signed)
Patient ID: [name], male   DOB: May 03, 1934, 80 y.o.   MRN: [id number]   SUBJECTIVE: No chest pain.  Abdominal pain improved.  Stronger, able to walk with PT.  Awaiting HIDA scan.   . aspirin EC  81 mg Oral Daily  . atorvastatin  40 mg Oral Daily  . carvedilol  25 mg Oral BID WC  . darifenacin  15 mg Oral Daily  . digoxin  0.125 mg Oral Daily  . docusate sodium  100 mg Oral BID  . multivitamin with minerals  1 tablet Oral Daily  . pantoprazole (PROTONIX) IV  40 mg Intravenous Q12H  . piperacillin-tazobactam (ZOSYN)  IV  3.375 g Intravenous Q8H  . potassium chloride  10 mEq Oral Daily  . senna  1 tablet Oral BID  . sodium chloride  3 mL Intravenous Q12H  . tamsulosin  0.4 mg Oral Daily  . vitamin B-12  1,000 mcg Oral Daily     Filed Vitals:   09/02/13 1401 09/02/13 1402 09/02/13 2025 09/03/13 0530  BP: 145/79 122/59 132/69 151/77  Pulse: 83 100 71 86  Temp:   98.2 F (36.8 C) 98 F (36.7 C)  TempSrc:   Oral Oral  Resp:   20 20  Height:      Weight:      SpO2:   98% 98%    Intake/Output Summary (Last 24 hours) at 09/03/13 0801 Last data filed at 09/03/13 0700  Gross per 24 hour  Intake 2209.17 ml  Output    200 ml  Net 2009.17 ml    LABS: Basic Metabolic Panel:  Recent Labs  09/02/13 1523 09/03/13 0511  NA 130* 132*  K 4.4 4.4  CL 94* 96  CO2 26 24  GLUCOSE 118* 103*  BUN 15 12  CREATININE 0.85 0.81  CALCIUM 9.2 9.4   Liver Function Tests:  Recent Labs  09/02/13 1523 09/03/13 0511  AST 93* 89*  ALT 67* 76*  ALKPHOS 102 112  BILITOT 0.8 0.7  PROT 6.1 6.0  ALBUMIN 3.0* 2.9*    Recent Labs  09/01/13 1055  LIPASE 17   CBC:  Recent Labs  09/01/13 1055 09/02/13 0523 09/03/13 0511  WBC 11.7* 9.3 7.0  NEUTROABS 9.2*  --   --   HGB 12.2* 11.2* 11.3*  HCT 33.8* 32.9* 32.7*  MCV 82.2 83.3 83.6  PLT 152 149* 144*   Cardiac Enzymes:  Recent Labs  09/01/13 1440 09/01/13 1805 09/01/13 2027  TROPONINI 0.46* 0.49* 0.44*    BNP: No components found with this basename: POCBNP,  D-Dimer: No results found for this basename: DDIMER,  in the last 72 hours Hemoglobin A1C:  Recent Labs  09/01/13 1805  HGBA1C 5.8*   Fasting Lipid Panel: No results found for this basename: CHOL, HDL, LDLCALC, TRIG, CHOLHDL, LDLDIRECT,  in the last 72 hours Thyroid Function Tests:  Recent Labs  09/01/13 1805  TSH 0.875   Anemia Panel: No results found for this basename: VITAMINB12, FOLATE, FERRITIN, TIBC, IRON, RETICCTPCT,  in the last 72 hours  RADIOLOGY: Dg Chest 2 View  09/01/2013   CLINICAL DATA:  Weakness. Atrial fibrillation. High blood pressure. Renal cell carcinoma. Prior smoker.  EXAM: CHEST  2 VIEW  COMPARISON:  06/16/2012.  FINDINGS: Post median sternotomy with fracture of upper sternal wires. Post CABG. Heart size top-normal. Calcified mildly tortuous aorta.  T12 compression fracture with prior cement augmentation unchanged. No new compression fracture detected.  No infiltrate, congestive heart failure or  pneumothorax.  No plain film evidence of pulmonary malignancy/metastatic disease.  IMPRESSION: Post median sternotomy with fracture of upper sternal wires. Post CABG. Heart size top-normal.  Calcified mildly tortuous aorta.  T12 compression fracture with prior cement augmentation unchanged. No new compression fracture detected.  No infiltrate, congestive heart failure or pneumothorax.  No plain film evidence of pulmonary malignancy/metastatic disease.   Electronically Signed   By: Chauncey Cruel M.D.   On: 09/01/2013 12:22   US Abdomen Complete  09/01/2013   CLINICAL DATA:  Right upper quadrant pain  EXAM: ULTRASOUND ABDOMEN COMPLETE  COMPARISON:  None.  FINDINGS: Gallbladder:  There is mild gallbladder wall thickening to 6 mm. No pericholecystic fluid. There is dependent sludge within the gallbladder. Negative sonographic Murphy's sign.  Common bile duct:  Diameter: Normal diameter 4 mm.  Liver:  No focal lesion  identified. Within normal limits in parenchymal echogenicity.  IVC:  No abnormality visualized.  Pancreas:  Visualized portion unremarkable.  Spleen:  Size and appearance within normal limits.  Right Kidney: 12.4cm in length. No evidence of hydronephrosis or stones. Anechoic 1.5 cm cyst in the upper pole. This is proteinaceous nonenhancing cyst on comparison CT.  Left Kidney:  Length: 12.7. Echogenicity within normal limits. No mass or hydronephrosis visualized.  Abdominal aorta:  No aneurysm visualized.  IMPRESSION:  Gallbladder sludge and gallbladder wall thickening without evidence of acute cholecystitis. The findings could represent chronic cholecystitis.   Electronically Signed   By: Suzy Bouchard M.D.   On: 09/01/2013 12:06   Ct Abdomen Pelvis W Contrast  09/01/2013   CLINICAL DATA:  Mid abdominal discomfort and generalized weakness, history of renal cell malignancy and radiofrequency ablation.  EXAM: CT ABDOMEN AND PELVIS WITH CONTRAST  TECHNIQUE: Multidetector CT imaging of the abdomen and pelvis was performed using the standard protocol following bolus administration of intravenous contrast.  CONTRAST:  138mL OMNIPAQUE IOHEXOL 300 MG/ML  SOLN  COMPARISON:  CT scan of the abdomen dated June 16, 2013.  FINDINGS: The gallbladder is abnormal in appearance. It is mildly distended and exhibits wall thickening and pericholecystic fluid. No calcified or noncalcified stones are demonstrated. The adjacent liver is normal in density and contour. There may be a trace of intrahepatic ductal dilation. The pancreas is somewhat atrophic and exhibits no evidence of inflammatory change or focal mass or ductal dilation. The stomach is moderately distended with food particles. The spleen is not enlarged. There are no adrenal masses  The kidneys exhibit no evidence of obstruction. There are findings consistent with the previous radiofrequency ablation on the right. There is a small hypodense focus medial to the lower  pole of the right kidney which is stable. It is demonstrated on image 52 of series 2. It measures 1.6 by a approximately 1 cm. A 2nd cortically based midpole hypodensity on the right is stable and is most compatible with a cyst. There is a stable 1.5 cm diameter hypodensity in the lateral aspect of the mid to upper pole of the right kidney demonstrated best on image 41 of series 2. In the medial aspect of the mid to upper pole on image 42 of series 2 there is a 1 cm diameter stable appearing hypodensity. On the left there is no evidence of obstruction or parenchymal masses. A subcentimeter hypodensity in the midpole laterally is consistent with a cyst.  On delayed images contrast within the renal collecting systems is within the limits of normal.  The caliber of the abdominal aorta is normal. The  periaortic and pericaval regions also appear normal. The partially contrast filled loops of small and large bowel exhibit no evidence of ileus nor of obstruction. A normal calibered, partially contrast-filled, uninflamed-appearing appendix is demonstrated. Within the pelvis the urinary bladder is mildly distended. The prostate gland is mildly enlarged and produces an impression upon the urinary bladder base. The seminal vesicles and prostate gland appear normal. There is a small fat containing left inguinal hernia. There is a tiny fat containing umbilical hernia.  The right lung base demonstrates very mild subsegmental atelectasis posteriorly. The lumbar vertebral bodies are preserved in height. There is degenerative disc change at multiple lumbar levels. There is partial compression of the body of T12 which appears to have been previously treated with kyphoplasty.  IMPRESSION: 1. The appearance of the gallbladder is abnormal suggesting acalculous cholecystitis. Ultrasound of the gallbladder would be useful. 2. The appearance of both kidneys is stable with no evidence of obstruction. Post ablation changes on the right are  present and stable. There are stable hypodensities remaining in the renal cortex of the right kidney. 3. No acute bowel abnormality is demonstrated. 4. There is mild distention of the urinary bladder which may be related to the mildly enlarged prostate gland. Foley catheterization would be useful.   Electronically Signed   By: David  [place]   On: 09/01/2013 19:08    PHYSICAL EXAM General: NAD Neck: JVP 8 cm, no thyromegaly or thyroid nodule.  Lungs: Clear to auscultation bilaterally with normal respiratory effort. CV: Nondisplaced PMI.  Heart regular S1/S2, mechanical S2, no S3/S4, 2/6 SEM.  1+ ankle edema. Abdomen: Soft, mild RUQ tenderness (improved), no hepatosplenomegaly, no distention.  Neurologic: Alert and oriented x 3.  Psych: Normal affect. Extremities: No clubbing or cyanosis.   TELEMETRY: Reviewed telemetry pt in atrial fibrillation  ASSESSMENT AND PLAN: 80 yo with history of CABG + mechanical AVR in 10/13 and permanent atrial fibrillation on warfarin came to the ER today with diffuse weakness and abdominal pain. As part of his workup, he was found to have a mildly elevated troponin.   CT abdomen showed signs of acalculous cholecystitis. 1. Generalized weakness: No focality. Has been unable to get out of bed since Friday. Influenza negative.  ? Related to gallbladder inflammation/acalculous cholecystitis.  Doing better, walked with PT. 2. Abdominal pain: Centered in RUQ, no peritoneal signs.  Pain improved today.  CT abdomen suggestive of acalculous cholecystitis.  Awaiting HIDA scan today, surgery following.  He is on Zosyn.  3. Atrial fibrillation: Permanent. HR controlled on Coreg.  - BP better, can increase Coreg back to 25 mg bid.  - Hold coumadin for now in case procedure needed, INR < 2 so heparin to be started. Restart coumadin if no procedure needed.  4. CAD: h/o CABG with AVR. No chest pain or ischemic ECG changes. Elevated troponin (mildly) in 0.4 range, steady with no  up-trend. I think that this likely represents demand ischemia likely in setting of inflammatory process (acalculous cholecystitis).  BP has also been low at times.   - heparin gtt now that INR< 2.  - ASA 81, statin, Coreg.  - Awaiting echo.  - Will arrange for [facility] tomorrow to assess for ischemia.  5. HTN: BP better, can increase Coreg to home dose.  6. Mechanical aortic valve: Stable on recent echo. Continue ASA 81, heparin gtt to start while coumadin held.   Loralie Champagne 09/03/2013 8:01 AM

## 2013-09-03 NOTE — Progress Notes (Signed)
Subjective: No complaints. He says he is hungry and wants to eat  Objective: Vital signs in last 24 hours: Temp:  [98 F (36.7 C)-99.3 F (37.4 C)] 98 F (36.7 C) (01/08 0530) Pulse Rate:  [71-100] 86 (01/08 0530) Resp:  [18-20] 20 (01/08 0530) BP: (122-151)/(59-79) 151/77 mmHg (01/08 0530) SpO2:  [97 %-98 %] 98 % (01/08 0530) Last BM Date: 08/30/13  Intake/Output from previous day: 01/07 0701 - 01/08 0700 In: 2209.2 [P.O.:960; I.V.:1149.2; IV Piggyback:100] Out: 200 [Urine:200] Intake/Output this shift:    Resp: clear to auscultation bilaterally Cardio: regular rate and rhythm GI: soft, minimal tenderness RUQ  Lab Results:   Recent Labs  09/02/13 0523 09/03/13 0511  WBC 9.3 7.0  HGB 11.2* 11.3*  HCT 32.9* 32.7*  PLT 149* 144*   BMET  Recent Labs  09/02/13 1523 09/03/13 0511  NA 130* 132*  K 4.4 4.4  CL 94* 96  CO2 26 24  GLUCOSE 118* 103*  BUN 15 12  CREATININE 0.85 0.81  CALCIUM 9.2 9.4   PT/INR  Recent Labs  09/02/13 0523 09/03/13 0511  LABPROT 25.2* 21.7*  INR 2.38* 1.96*   ABG No results found for this basename: PHART, PCO2, PO2, HCO3,  in the last 72 hours  Studies/Results: Nm Hepatobiliary Liver Func  09/03/2013   CLINICAL DATA:  Right upper quadrant pain  EXAM: NUCLEAR MEDICINE HEPATOBILIARY IMAGING  TECHNIQUE: Sequential images of the abdomen were obtained out to 60 minutes following intravenous administration of radiopharmaceutical.  COMPARISON:  None.  RADIOPHARMACEUTICALS:  5.23mCi Tc-27m Choletec  FINDINGS: Following the intravenous administration of the radiopharmaceutical there is uniform tracer uptake with clearance from blood pool. Tracer activity within the common bile duct and small bowel loops noted by 15 min. After 60 min no activity is noted within the gallbladder. The patient was given 4 mg of morphine sulfate, IV and imaging was carried out for an additional 30 min. 15 min after administering morphine radiotracer began  accumulating within the gallbladder.  IMPRESSION: 1. Patent cystic duct without evidence for acute cholecystitis. 2. Delayed gallbladder filling compatible with chronic cholecystitis.   Electronically Signed   By: Kerby Moors M.D.   On: 09/03/2013 13:11   Ct Abdomen Pelvis W Contrast  09/01/2013   CLINICAL DATA:  Mid abdominal discomfort and generalized weakness, history of renal cell malignancy and radiofrequency ablation.  EXAM: CT ABDOMEN AND PELVIS WITH CONTRAST  TECHNIQUE: Multidetector CT imaging of the abdomen and pelvis was performed using the standard protocol following bolus administration of intravenous contrast.  CONTRAST:  143mL OMNIPAQUE IOHEXOL 300 MG/ML  SOLN  COMPARISON:  CT scan of the abdomen dated June 16, 2013.  FINDINGS: The gallbladder is abnormal in appearance. It is mildly distended and exhibits wall thickening and pericholecystic fluid. No calcified or noncalcified stones are demonstrated. The adjacent liver is normal in density and contour. There may be a trace of intrahepatic ductal dilation. The pancreas is somewhat atrophic and exhibits no evidence of inflammatory change or focal mass or ductal dilation. The stomach is moderately distended with food particles. The spleen is not enlarged. There are no adrenal masses  The kidneys exhibit no evidence of obstruction. There are findings consistent with the previous radiofrequency ablation on the right. There is a small hypodense focus medial to the lower pole of the right kidney which is stable. It is demonstrated on image 52 of series 2. It measures 1.6 by a approximately 1 cm. A 2nd cortically based midpole hypodensity on  the right is stable and is most compatible with a cyst. There is a stable 1.5 cm diameter hypodensity in the lateral aspect of the mid to upper pole of the right kidney demonstrated best on image 41 of series 2. In the medial aspect of the mid to upper pole on image 42 of series 2 there is a 1 cm diameter stable  appearing hypodensity. On the left there is no evidence of obstruction or parenchymal masses. A subcentimeter hypodensity in the midpole laterally is consistent with a cyst.  On delayed images contrast within the renal collecting systems is within the limits of normal.  The caliber of the abdominal aorta is normal. The periaortic and pericaval regions also appear normal. The partially contrast filled loops of small and large bowel exhibit no evidence of ileus nor of obstruction. A normal calibered, partially contrast-filled, uninflamed-appearing appendix is demonstrated. Within the pelvis the urinary bladder is mildly distended. The prostate gland is mildly enlarged and produces an impression upon the urinary bladder base. The seminal vesicles and prostate gland appear normal. There is a small fat containing left inguinal hernia. There is a tiny fat containing umbilical hernia.  The right lung base demonstrates very mild subsegmental atelectasis posteriorly. The lumbar vertebral bodies are preserved in height. There is degenerative disc change at multiple lumbar levels. There is partial compression of the body of T12 which appears to have been previously treated with kyphoplasty.  IMPRESSION: 1. The appearance of the gallbladder is abnormal suggesting acalculous cholecystitis. Ultrasound of the gallbladder would be useful. 2. The appearance of both kidneys is stable with no evidence of obstruction. Post ablation changes on the right are present and stable. There are stable hypodensities remaining in the renal cortex of the right kidney. 3. No acute bowel abnormality is demonstrated. 4. There is mild distention of the urinary bladder which may be related to the mildly enlarged prostate gland. Foley catheterization would be useful.   Electronically Signed   By: David  [place]   On: 09/01/2013 19:08    Anti-infectives: Anti-infectives   Start     Dose/Rate Route Frequency Ordered Stop   09/02/13 1800   piperacillin-tazobactam (ZOSYN) IVPB 3.375 g     3.375 g 12.5 mL/hr over 240 Minutes Intravenous Every 8 hours 09/02/13 1719        Assessment/Plan: s/p * No surgery found * Will review HIDA scan with radiology Cardiology planning for stress test on Saturday which will help determine his risk for surgery given his elevated troponin  LOS: 2 days    TOTH III,Gemma Ruan S 09/03/2013

## 2013-09-03 NOTE — Progress Notes (Signed)
PT Cancellation Note  ___Treatment cancelled today due to medical issues with patient which prohibited therapy  ___ Treatment cancelled today due to patient receiving procedure or test   ___ Treatment cancelled today due to patient's refusal to participate   _X_ Treatment cancelled today due to test out of room in [facility]  Rica Koyanagi  PTA [facility]  Acute  Rehab Pager      [phone]

## 2013-09-03 NOTE — Progress Notes (Signed)
ANTICOAGULATION CONSULT NOTE - Initial Consult  Pharmacy Consult for Heparin Indication: St Jude mechanical aortic valve  No Known Allergies  Patient Measurements: Height: 6\' 4"  (193 cm) Weight: 228 lb 6.3 oz (103.6 kg) IBW/kg (Calculated) : 86.8 Heparin Dosing Weight: 103.6 kg  Vital Signs: Temp: 98 F (36.7 C) (01/08 0530) Temp src: Oral (01/08 0530) BP: 151/77 mmHg (01/08 0530) Pulse Rate: 86 (01/08 0530)  Labs:  Recent Labs  09/01/13 1055 09/01/13 1440 09/01/13 1805 09/01/13 2027 09/02/13 0523 09/02/13 1523 09/03/13 0511  HGB 12.2*  --   --   --  11.2*  --  11.3*  HCT 33.8*  --   --   --  32.9*  --  32.7*  PLT 152  --   --   --  149*  --  144*  LABPROT 28.8*  --   --   --  25.2*  --  21.7*  INR 2.83*  --   --   --  2.38*  --  1.96*  CREATININE 0.90  --   --   --  0.90 0.85 0.81  TROPONINI 0.47* 0.46* 0.49* 0.44*  --   --   --     Estimated Creatinine Clearance: 93.8 ml/min (by C-G formula based on Cr of 0.81).   Medical History: Past Medical History  Diagnosis Date  . Hypokalemia   . Hyperlipidemia   . Atrial fibrillation     longterm persistent  . CAD (coronary artery disease)   . HTN (hypertension)   . Shortness of breath     PT STATES RELATED TO HIS AF AND HEART BEING OUT OF RHYTHM  . Back pain, chronic     SINCE BACK INJURY / FRACTURE  . GERD (gastroesophageal reflux disease)   . Arthritis   . Cough 06/16/12    C/O OF COUGH / COLD FOR A COUPLE OF WEEKS  . Cancer     RIGHT RENAL  . Aortic valve replaced   . Stomach ulcer   . Constipation   . BPH (benign prostatic hyperplasia)     grapey    Medications:  Scheduled:  . aspirin EC  81 mg Oral Daily  . atorvastatin  40 mg Oral Daily  . carvedilol  25 mg Oral BID WC  . darifenacin  15 mg Oral Daily  . digoxin  0.125 mg Oral Daily  . docusate sodium  100 mg Oral BID  . multivitamin with minerals  1 tablet Oral Daily  . pantoprazole (PROTONIX) IV  40 mg Intravenous Q12H  .  piperacillin-tazobactam (ZOSYN)  IV  3.375 g Intravenous Q8H  . potassium chloride  10 mEq Oral Daily  . senna  1 tablet Oral BID  . sodium chloride  3 mL Intravenous Q12H  . tamsulosin  0.4 mg Oral Daily  . vitamin B-12  1,000 mcg Oral Daily   Infusions:  . sodium chloride 50 mL/hr at 09/02/13 2300    Assessment: 80 yo male on chronic warfarin for mechanical St. Jude AVR and afib. Warfarin is now on hold for possible surgery for acute cholecystitis, HIDA scan pending. Now that INR is < 2.0, Cardiology has ordered to begin IV heparin per Pharmacy.  Will not give heparin bolus since INR is only slightly subtherapeutic at the moment (1.96)  Heparin dosing weight 103 kg  CBC low but stable, no bleeding reported  SCr wnl, CrCl 93 ml/min  Concurrent aspirin 81mg  daily noted  Goal of Therapy:  Heparin level 0.3-0.7 units/ml  Monitor platelets by anticoagulation protocol: Yes   Plan:   Begin heparin 1200 units/hr (12 units/kg/hr)  Check heparin level in 8hr  Daily heparin level, CBC  F/u plans for resuming warfarin if no procedures needed  Peggyann Juba, PharmD, BCPS Pager: [phone] 09/03/2013,8:32 AM

## 2013-09-03 NOTE — Progress Notes (Signed)
*  PRELIMINARY RESULTS* Echocardiogram 2D Echocardiogram has been performed.  Renee Erb 09/03/2013, 3:10 PM

## 2013-09-03 NOTE — Progress Notes (Signed)
OT Cancellation Note  Patient Details Name: FREDERICO GERLING MRN: [id number] DOB: 1933-12-18   Cancelled Treatment:    Reason Eval/Treat Not Completed: Patient at procedure or test/ unavailable  Ashley, Thereasa Parkin 09/03/2013, 11:21 AM

## 2013-09-03 NOTE — Plan of Care (Signed)
Problem: Phase I Progression Outcomes Goal: Pain controlled with appropriate interventions Outcome: Not Applicable Date Met:  11/94/17 Pt has no complaints of pain

## 2013-09-03 NOTE — Progress Notes (Addendum)
[facility] PROGRESS NOTE  [name] WNU:[id number] DOB: [birth year] DOA: 09/01/2013 PCP: Gennette Pac, MD  Assessment/Plan: Acalculus cholecystitis -AST mildly elevated, but suspect this may be due to NASH.  -Lipase and other liver functions tests normal. Lactic acid neg. UA neg.  -concerned about cholecystitis  -patient also has history of RCC s/p two radiofrequency ablations.  - CT abd/pelvis--gallbladder wall thickening with pericholecystic fluid and trace biliary ductal dilatation  - General surgery consultation given suspicion of chronic cholecystitis on [place].  -HIDA--patent cystic duct, chronic cholecystitis - continue zosyn -diet was advance and pt tolerated today -appreciate surgery input Progressive generalized weakness,  -No focal abnormalities - Multifactorial including deconditioning as well as his infectious process and other electrolyte arrangements  - IVF  - PT/OT evaluation  - TSH--0.875  - Cortisol 14.1  CAD with mild elevation of troponin,  -myoview planned 09/05/13 -s/p CABG/AVR,  -likely demand ischemia  - ASA, statin, BB  -Appreciate Cardiology following  -restart demadex if okay with cardiology Permanent atrial fibrillation  -on A/C and rate controlled on BB and dig  - Heparin gtt with INR < 2 for anticipation of surgery -Echo EF 55-60%, well function AV prosthesis Mechanical AVR  -IV heparin when INR <2 if surgery is anticipated  GERD,  -possible acid-reflux like symptoms  - Start PPI  Leukocytosis  -Due to acute medical condition  -Improved  -Afebrile and hemodynamically stable  Family Communication: wife at beside  Disposition Plan: Home when medically stable   Antibiotics:  Zosyn 09/02/13>>>    Procedures/Studies: Dg Chest 2 View  09/01/2013   CLINICAL DATA:  Weakness. Atrial fibrillation. High blood pressure. Renal cell carcinoma. Prior smoker.  EXAM: CHEST  2 VIEW  COMPARISON:  06/16/2012.  FINDINGS: Post median sternotomy  with fracture of upper sternal wires. Post CABG. Heart size top-normal. Calcified mildly tortuous aorta.  T12 compression fracture with prior cement augmentation unchanged. No new compression fracture detected.  No infiltrate, congestive heart failure or pneumothorax.  No plain film evidence of pulmonary malignancy/metastatic disease.  IMPRESSION: Post median sternotomy with fracture of upper sternal wires. Post CABG. Heart size top-normal.  Calcified mildly tortuous aorta.  T12 compression fracture with prior cement augmentation unchanged. No new compression fracture detected.  No infiltrate, congestive heart failure or pneumothorax.  No plain film evidence of pulmonary malignancy/metastatic disease.   Electronically Signed   By: Chauncey Cruel M.D.   On: 09/01/2013 12:22   Nm Hepatobiliary Liver Func  09/03/2013   CLINICAL DATA:  Right upper quadrant pain  EXAM: NUCLEAR MEDICINE HEPATOBILIARY IMAGING  TECHNIQUE: Sequential images of the abdomen were obtained out to 60 minutes following intravenous administration of radiopharmaceutical.  COMPARISON:  None.  RADIOPHARMACEUTICALS:  5.107mCi Tc-40m Choletec  FINDINGS: Following the intravenous administration of the radiopharmaceutical there is uniform tracer uptake with clearance from blood pool. Tracer activity within the common bile duct and small bowel loops noted by 15 min. After 60 min no activity is noted within the gallbladder. The patient was given 4 mg of morphine sulfate, IV and imaging was carried out for an additional 30 min. 15 min after administering morphine radiotracer began accumulating within the gallbladder.  IMPRESSION: 1. Patent cystic duct without evidence for acute cholecystitis. 2. Delayed gallbladder filling compatible with chronic cholecystitis.   Electronically Signed   By: Kerby Moors M.D.   On: 09/03/2013 13:11   US Abdomen Complete  09/01/2013   CLINICAL DATA:  Right upper quadrant pain  EXAM: ULTRASOUND ABDOMEN COMPLETE  COMPARISON:   None.  FINDINGS: Gallbladder:  There is mild gallbladder wall thickening to 6 mm. No pericholecystic fluid. There is dependent sludge within the gallbladder. Negative sonographic Murphy's sign.  Common bile duct:  Diameter: Normal diameter 4 mm.  Liver:  No focal lesion identified. Within normal limits in parenchymal echogenicity.  IVC:  No abnormality visualized.  Pancreas:  Visualized portion unremarkable.  Spleen:  Size and appearance within normal limits.  Right Kidney: 12.4cm in length. No evidence of hydronephrosis or stones. Anechoic 1.5 cm cyst in the upper pole. This is proteinaceous nonenhancing cyst on comparison CT.  Left Kidney:  Length: 12.7. Echogenicity within normal limits. No mass or hydronephrosis visualized.  Abdominal aorta:  No aneurysm visualized.  IMPRESSION:  Gallbladder sludge and gallbladder wall thickening without evidence of acute cholecystitis. The findings could represent chronic cholecystitis.   Electronically Signed   By: Suzy Bouchard M.D.   On: 09/01/2013 12:06   Ct Abdomen Pelvis W Contrast  09/01/2013   CLINICAL DATA:  Mid abdominal discomfort and generalized weakness, history of renal cell malignancy and radiofrequency ablation.  EXAM: CT ABDOMEN AND PELVIS WITH CONTRAST  TECHNIQUE: Multidetector CT imaging of the abdomen and pelvis was performed using the standard protocol following bolus administration of intravenous contrast.  CONTRAST:  134mL OMNIPAQUE IOHEXOL 300 MG/ML  SOLN  COMPARISON:  CT scan of the abdomen dated June 16, 2013.  FINDINGS: The gallbladder is abnormal in appearance. It is mildly distended and exhibits wall thickening and pericholecystic fluid. No calcified or noncalcified stones are demonstrated. The adjacent liver is normal in density and contour. There may be a trace of intrahepatic ductal dilation. The pancreas is somewhat atrophic and exhibits no evidence of inflammatory change or focal mass or ductal dilation. The stomach is moderately  distended with food particles. The spleen is not enlarged. There are no adrenal masses  The kidneys exhibit no evidence of obstruction. There are findings consistent with the previous radiofrequency ablation on the right. There is a small hypodense focus medial to the lower pole of the right kidney which is stable. It is demonstrated on image 52 of series 2. It measures 1.6 by a approximately 1 cm. A 2nd cortically based midpole hypodensity on the right is stable and is most compatible with a cyst. There is a stable 1.5 cm diameter hypodensity in the lateral aspect of the mid to upper pole of the right kidney demonstrated best on image 41 of series 2. In the medial aspect of the mid to upper pole on image 42 of series 2 there is a 1 cm diameter stable appearing hypodensity. On the left there is no evidence of obstruction or parenchymal masses. A subcentimeter hypodensity in the midpole laterally is consistent with a cyst.  On delayed images contrast within the renal collecting systems is within the limits of normal.  The caliber of the abdominal aorta is normal. The periaortic and pericaval regions also appear normal. The partially contrast filled loops of small and large bowel exhibit no evidence of ileus nor of obstruction. A normal calibered, partially contrast-filled, uninflamed-appearing appendix is demonstrated. Within the pelvis the urinary bladder is mildly distended. The prostate gland is mildly enlarged and produces an impression upon the urinary bladder base. The seminal vesicles and prostate gland appear normal. There is a small fat containing left inguinal hernia. There is a tiny fat containing umbilical hernia.  The right lung base demonstrates very mild subsegmental atelectasis posteriorly. The lumbar vertebral bodies are preserved  in height. There is degenerative disc change at multiple lumbar levels. There is partial compression of the body of T12 which appears to have been previously treated with  kyphoplasty.  IMPRESSION: 1. The appearance of the gallbladder is abnormal suggesting acalculous cholecystitis. Ultrasound of the gallbladder would be useful. 2. The appearance of both kidneys is stable with no evidence of obstruction. Post ablation changes on the right are present and stable. There are stable hypodensities remaining in the renal cortex of the right kidney. 3. No acute bowel abnormality is demonstrated. 4. There is mild distention of the urinary bladder which may be related to the mildly enlarged prostate gland. Foley catheterization would be useful.   Electronically Signed   By: Suhan Paci  [place]   On: 09/01/2013 19:08         Subjective: Patient tolerated his diet today. He denies any fevers, chills, chest discomfort, shortness of breath, nausea, vomiting, diarrhea, dysuria, hematuria.  Objective: Filed Vitals:   09/02/13 1402 09/02/13 2025 09/03/13 0530 09/03/13 1408  BP: 122/59 132/69 151/77 168/79  Pulse: 100 71 86 83  Temp:  98.2 F (36.8 C) 98 F (36.7 C) 97.3 F (36.3 C)  TempSrc:  Oral Oral Oral  Resp:  20 20 20   Height:      Weight:      SpO2:  98% 98% 98%    Intake/Output Summary (Last 24 hours) at 09/03/13 1922 Last data filed at 09/03/13 1700  Gross per 24 hour  Intake 1134.17 ml  Output    200 ml  Net 934.17 ml   Weight change:  Exam:   General:  Pt is alert, follows commands appropriately, not in acute distress  HEENT: No icterus, No thrush,  [place]/AT  Cardiovascular: RRR, S1/S2, no rubs, no gallops  Respiratory: CTA bilaterally, no wheezing, no crackles, no rhonchi  Abdomen: Soft/+BS, non tender, non distended, no guarding  Extremities: 2+ LE edema, No lymphangitis, No petechiae, No rashes, no synovitis  Data Reviewed: Basic Metabolic Panel:  Recent Labs Lab 09/01/13 1055 09/02/13 0523 09/02/13 1523 09/03/13 0511  NA 131* 132* 130* 132*  K 4.1 4.2 4.4 4.4  CL 92* 96 94* 96  CO2 26 26 26 24   GLUCOSE 142* 117* 118* 103*  BUN 18  18 15 12   CREATININE 0.90 0.90 0.85 0.81  CALCIUM 9.2 9.2 9.2 9.4   Liver Function Tests:  Recent Labs Lab 09/01/13 1055 09/02/13 0523 09/02/13 1523 09/03/13 0511  AST 75* 78* 93* 89*  ALT 36 52 67* 76*  ALKPHOS 90 101 102 112  BILITOT 1.2 0.8 0.8 0.7  PROT 6.3 6.0 6.1 6.0  ALBUMIN 3.2* 2.9* 3.0* 2.9*    Recent Labs Lab 09/01/13 1055  LIPASE 17   No results found for this basename: AMMONIA,  in the last 168 hours CBC:  Recent Labs Lab 09/01/13 1055 09/02/13 0523 09/03/13 0511  WBC 11.7* 9.3 7.0  NEUTROABS 9.2*  --   --   HGB 12.2* 11.2* 11.3*  HCT 33.8* 32.9* 32.7*  MCV 82.2 83.3 83.6  PLT 152 149* 144*   Cardiac Enzymes:  Recent Labs Lab 09/01/13 1055 09/01/13 1440 09/01/13 1805 09/01/13 2027  TROPONINI 0.47* 0.46* 0.49* 0.44*   BNP: No components found with this basename: POCBNP,  CBG:  Recent Labs Lab 09/02/13 2157 09/03/13 0736  GLUCAP 134* 110*    No results found for this or any previous visit (from the past 240 hour(s)).   Scheduled Meds: . aspirin EC  81  mg Oral Daily  . atorvastatin  40 mg Oral Daily  . carvedilol  25 mg Oral BID WC  . darifenacin  15 mg Oral Daily  . digoxin  0.125 mg Oral Daily  . docusate sodium  100 mg Oral BID  . multivitamin with minerals  1 tablet Oral Daily  . pantoprazole (PROTONIX) IV  40 mg Intravenous Q12H  . piperacillin-tazobactam (ZOSYN)  IV  3.375 g Intravenous Q8H  . potassium chloride  10 mEq Oral Daily  . senna  1 tablet Oral BID  . sodium chloride  3 mL Intravenous Q12H  . tamsulosin  0.4 mg Oral Daily  . vitamin B-12  1,000 mcg Oral Daily   Continuous Infusions: . sodium chloride 50 mL/hr at 09/02/13 2300  . heparin 1,200 Units/hr (09/03/13 1500)     Rodric Punch, DO  [facility] Pager [phone]  If 7PM-7AM, please contact night-coverage [email or web address] Password [email or web address] 09/03/2013, 7:22 PM   LOS: 2 days

## 2013-09-04 ENCOUNTER — Other Ambulatory Visit: Payer: Self-pay

## 2013-09-04 DIAGNOSIS — K819 Cholecystitis, unspecified: Secondary | ICD-10-CM

## 2013-09-04 LAB — CBC
HCT: 32.7 % — ABNORMAL LOW (ref 39.0–52.0)
Hemoglobin: 11.6 g/dL — ABNORMAL LOW (ref 13.0–17.0)
MCH: 29.1 pg (ref 26.0–34.0)
MCHC: 35.5 g/dL (ref 30.0–36.0)
MCV: 82.2 fL (ref 78.0–100.0)
Platelets: 182 10*3/uL (ref 150–400)
RBC: 3.98 MIL/uL — AB (ref 4.22–5.81)
RDW: 12.4 % (ref 11.5–15.5)
WBC: 6.9 10*3/uL (ref 4.0–10.5)

## 2013-09-04 LAB — COMPREHENSIVE METABOLIC PANEL
ALBUMIN: 2.9 g/dL — AB (ref 3.5–5.2)
ALT: 77 U/L — ABNORMAL HIGH (ref 0–53)
AST: 62 U/L — ABNORMAL HIGH (ref 0–37)
Alkaline Phosphatase: 114 U/L (ref 39–117)
BUN: 13 mg/dL (ref 6–23)
CHLORIDE: 97 meq/L (ref 96–112)
CO2: 26 mEq/L (ref 19–32)
CREATININE: 0.91 mg/dL (ref 0.50–1.35)
Calcium: 9.5 mg/dL (ref 8.4–10.5)
GFR calc Af Amer: >90 mL/min (ref >90)
GFR calc non Af Amer: 80 mL/min — ABNORMAL LOW (ref >90)
Glucose, Bld: 117 mg/dL — ABNORMAL HIGH (ref 70–99)
Potassium: 4.1 mEq/L (ref 3.7–5.3)
Sodium: 135 mEq/L — ABNORMAL LOW (ref 137–147)
Total Bilirubin: 0.5 mg/dL (ref 0.3–1.2)
Total Protein: 6.1 g/dL (ref 6.0–8.3)

## 2013-09-04 LAB — HEPARIN LEVEL (UNFRACTIONATED)
HEPARIN UNFRACTIONATED: 0.12 [IU]/mL — AB (ref 0.30–0.70)
HEPARIN UNFRACTIONATED: 0.36 [IU]/mL (ref 0.30–0.70)
Heparin Unfractionated: 0.48 IU/mL (ref 0.30–0.70)

## 2013-09-04 LAB — PROTIME-INR
INR: 1.75 — ABNORMAL HIGH (ref 0.00–1.49)
PROTHROMBIN TIME: 19.9 s — AB (ref 11.6–15.2)

## 2013-09-04 LAB — FOLATE RBC: RBC Folate: 1207 ng/mL — ABNORMAL HIGH (ref >280)

## 2013-09-04 LAB — VITAMIN B12: VITAMIN B 12: 740 pg/mL (ref 211–911)

## 2013-09-04 MED ORDER — HEPARIN (PORCINE) IN NACL 100-0.45 UNIT/ML-% IJ SOLN
1900.0000 [IU]/h | INTRAMUSCULAR | Status: DC
  Administered 2013-09-04 – 2013-09-06 (×4): 1900 [IU]/h via INTRAVENOUS
  Filled 2013-09-04 (×9): qty 250

## 2013-09-04 MED ORDER — DIGOXIN 125 MCG PO TABS: 0.1250 mg | ORAL_TABLET | Freq: Every day | ORAL | Status: DC

## 2013-09-04 NOTE — Progress Notes (Signed)
PHARMACIST - PHYSICIAN COMMUNICATION CONCERNING:  Heparin   Please see pharmacist note from 1/8 for full details.  In brief, 73 yoM with mechanical AVR on chronic coumadin now with possible cholecystitis.  Coumadin placed on hold and IV heparin bridge started 1/8 for possible surgery.    Assessment:  1st HL = <0.10, HL this am= 0.12 (low again)  No bleeding or infusion issues per RN  Plan:  Increase heparin drip to 1900 units/hr  Recheck HL in 8 hours  Daily CBC/HL  Dorrene German 09/04/2013, 6:49 AM

## 2013-09-04 NOTE — Progress Notes (Signed)
Patient ID: MARCANTHONY BODIN, male   DOB: 14-Jul-1934, 80 y.o.   MRN: [id number]   SUBJECTIVE: No chest pain.  Abdominal pain improved.  Stronger, able to walk with PT.     [name] aspirin EC  81 mg Oral Daily  . atorvastatin  40 mg Oral Daily  . carvedilol  25 mg Oral BID WC  . darifenacin  15 mg Oral Daily  . digoxin  0.125 mg Oral Daily  . docusate sodium  100 mg Oral BID  . multivitamin with minerals  1 tablet Oral Daily  . pantoprazole (PROTONIX) IV  40 mg Intravenous Q12H  . piperacillin-tazobactam (ZOSYN)  IV  3.375 g Intravenous Q8H  . potassium chloride  10 mEq Oral Daily  . senna  1 tablet Oral BID  . sodium chloride  3 mL Intravenous Q12H  . tamsulosin  0.4 mg Oral Daily  . vitamin B-12  1,000 mcg Oral Daily     Filed Vitals:   09/03/13 0530 09/03/13 1408 09/03/13 2129 09/04/13 0603  BP: 151/77 168/79 136/66 126/50  Pulse: 86 83 90 72  Temp: 98 F (36.7 C) 97.3 F (36.3 C) 98.2 F (36.8 C) 97.3 F (36.3 C)  TempSrc: Oral Oral Oral Oral  Resp: 20 20 20 18   Height:      Weight:      SpO2: 98% 98% 94% 98%    Intake/Output Summary (Last 24 hours) at 09/04/13 0717 Last data filed at 09/04/13 0700  Gross per 24 hour  Intake 2422.47 ml  Output   1700 ml  Net 722.47 ml    LABS: Basic Metabolic Panel:  Recent Labs  09/03/13 0511 09/04/13 0500  NA 132* 135*  K 4.4 4.1  CL 96 97  CO2 24 26  GLUCOSE 103* 117*  BUN 12 13  CREATININE 0.81 0.91  CALCIUM 9.4 9.5   Liver Function Tests:  Recent Labs  09/03/13 0511 09/04/13 0500  AST 89* 62*  ALT 76* 77*  ALKPHOS 112 114  BILITOT 0.7 0.5  PROT 6.0 6.1  ALBUMIN 2.9* 2.9*    Recent Labs  09/01/13 1055  LIPASE 17   CBC:  Recent Labs  09/01/13 1055  09/03/13 0511 09/04/13 0500  WBC 11.7*  < > 7.0 6.9  NEUTROABS 9.2*  --   --   --   HGB 12.2*  < > 11.3* 11.6*  HCT 33.8*  < > 32.7* 32.7*  MCV 82.2  < > 83.6 82.2  PLT 152  < > 144* 182  < > = values in this interval not displayed. Cardiac  Enzymes:  Recent Labs  09/01/13 1440 09/01/13 1805 09/01/13 2027  TROPONINI 0.46* 0.49* 0.44*   BNP: No components found with this basename: POCBNP,  D-Dimer: No results found for this basename: DDIMER,  in the last 72 hours Hemoglobin A1C:  Recent Labs  09/01/13 1805  HGBA1C 5.8*   Fasting Lipid Panel: No results found for this basename: CHOL, HDL, LDLCALC, TRIG, CHOLHDL, LDLDIRECT,  in the last 72 hours Thyroid Function Tests:  Recent Labs  09/01/13 1805  TSH 0.875   Anemia Panel: No results found for this basename: VITAMINB12, FOLATE, FERRITIN, TIBC, IRON, RETICCTPCT,  in the last 72 hours  RADIOLOGY: Dg Chest 2 View  09/01/2013   CLINICAL DATA:  Weakness. Atrial fibrillation. High blood pressure. Renal cell carcinoma. Prior smoker.  EXAM: CHEST  2 VIEW  COMPARISON:  06/16/2012.  FINDINGS: Post median sternotomy with fracture of upper sternal wires.  Post CABG. Heart size top-normal. Calcified mildly tortuous aorta.  T12 compression fracture with prior cement augmentation unchanged. No new compression fracture detected.  No infiltrate, congestive heart failure or pneumothorax.  No plain film evidence of pulmonary malignancy/metastatic disease.  IMPRESSION: Post median sternotomy with fracture of upper sternal wires. Post CABG. Heart size top-normal.  Calcified mildly tortuous aorta.  T12 compression fracture with prior cement augmentation unchanged. No new compression fracture detected.  No infiltrate, congestive heart failure or pneumothorax.  No plain film evidence of pulmonary malignancy/metastatic disease.   Electronically Signed   By: Chauncey Cruel M.D.   On: 09/01/2013 12:22   US Abdomen Complete  09/01/2013   CLINICAL DATA:  Right upper quadrant pain  EXAM: ULTRASOUND ABDOMEN COMPLETE  COMPARISON:  None.  FINDINGS: Gallbladder:  There is mild gallbladder wall thickening to 6 mm. No pericholecystic fluid. There is dependent sludge within the gallbladder. Negative sonographic  Murphy's sign.  Common bile duct:  Diameter: Normal diameter 4 mm.  Liver:  No focal lesion identified. Within normal limits in parenchymal echogenicity.  IVC:  No abnormality visualized.  Pancreas:  Visualized portion unremarkable.  Spleen:  Size and appearance within normal limits.  Right Kidney: 12.4cm in length. No evidence of hydronephrosis or stones. Anechoic 1.5 cm cyst in the upper pole. This is proteinaceous nonenhancing cyst on comparison CT.  Left Kidney:  Length: 12.7. Echogenicity within normal limits. No mass or hydronephrosis visualized.  Abdominal aorta:  No aneurysm visualized.  IMPRESSION:  Gallbladder sludge and gallbladder wall thickening without evidence of acute cholecystitis. The findings could represent chronic cholecystitis.   Electronically Signed   By: Suzy Bouchard M.D.   On: 09/01/2013 12:06   Ct Abdomen Pelvis W Contrast  09/01/2013   CLINICAL DATA:  Mid abdominal discomfort and generalized weakness, history of renal cell malignancy and radiofrequency ablation.  EXAM: CT ABDOMEN AND PELVIS WITH CONTRAST  TECHNIQUE: Multidetector CT imaging of the abdomen and pelvis was performed using the standard protocol following bolus administration of intravenous contrast.  CONTRAST:  133mL OMNIPAQUE IOHEXOL 300 MG/ML  SOLN  COMPARISON:  CT scan of the abdomen dated June 16, 2013.  FINDINGS: The gallbladder is abnormal in appearance. It is mildly distended and exhibits wall thickening and pericholecystic fluid. No calcified or noncalcified stones are demonstrated. The adjacent liver is normal in density and contour. There may be a trace of intrahepatic ductal dilation. The pancreas is somewhat atrophic and exhibits no evidence of inflammatory change or focal mass or ductal dilation. The stomach is moderately distended with food particles. The spleen is not enlarged. There are no adrenal masses  The kidneys exhibit no evidence of obstruction. There are findings consistent with the previous  radiofrequency ablation on the right. There is a small hypodense focus medial to the lower pole of the right kidney which is stable. It is demonstrated on image 52 of series 2. It measures 1.6 by a approximately 1 cm. A 2nd cortically based midpole hypodensity on the right is stable and is most compatible with a cyst. There is a stable 1.5 cm diameter hypodensity in the lateral aspect of the mid to upper pole of the right kidney demonstrated best on image 41 of series 2. In the medial aspect of the mid to upper pole on image 42 of series 2 there is a 1 cm diameter stable appearing hypodensity. On the left there is no evidence of obstruction or parenchymal masses. A subcentimeter hypodensity in the midpole laterally is  consistent with a cyst.  On delayed images contrast within the renal collecting systems is within the limits of normal.  The caliber of the abdominal aorta is normal. The periaortic and pericaval regions also appear normal. The partially contrast filled loops of small and large bowel exhibit no evidence of ileus nor of obstruction. A normal calibered, partially contrast-filled, uninflamed-appearing appendix is demonstrated. Within the pelvis the urinary bladder is mildly distended. The prostate gland is mildly enlarged and produces an impression upon the urinary bladder base. The seminal vesicles and prostate gland appear normal. There is a small fat containing left inguinal hernia. There is a tiny fat containing umbilical hernia.  The right lung base demonstrates very mild subsegmental atelectasis posteriorly. The lumbar vertebral bodies are preserved in height. There is degenerative disc change at multiple lumbar levels. There is partial compression of the body of T12 which appears to have been previously treated with kyphoplasty.  IMPRESSION: 1. The appearance of the gallbladder is abnormal suggesting acalculous cholecystitis. Ultrasound of the gallbladder would be useful. 2. The appearance of both  kidneys is stable with no evidence of obstruction. Post ablation changes on the right are present and stable. There are stable hypodensities remaining in the renal cortex of the right kidney. 3. No acute bowel abnormality is demonstrated. 4. There is mild distention of the urinary bladder which may be related to the mildly enlarged prostate gland. Foley catheterization would be useful.   Electronically Signed   By: David  [place]   On: 09/01/2013 19:08    PHYSICAL EXAM General: NAD Neck: JVP 7 cm, no thyromegaly or thyroid nodule.  Lungs: Clear to auscultation bilaterally with normal respiratory effort. CV: Nondisplaced PMI.  Heart regular S1/S2, mechanical S2, no S3/S4, 2/6 SEM.  Trace ankle edema. Abdomen: Soft, mild RUQ tenderness (improved), no hepatosplenomegaly, no distention.  Neurologic: Alert and oriented x 3.  Psych: Normal affect. Extremities: No clubbing or cyanosis.   TELEMETRY: Reviewed telemetry pt in atrial fibrillation  ASSESSMENT AND PLAN: 80 yo with history of CABG + mechanical AVR in 10/13 and permanent atrial fibrillation on warfarin came to the ER today with diffuse weakness and abdominal pain. As part of his workup, he was found to have a mildly elevated troponin.   CT abdomen showed signs of acalculous cholecystitis. 1. Generalized weakness: No focality. Has been unable to get out of bed since Friday. Influenza negative.  ? Related to gallbladder inflammation/acalculous cholecystitis.  Doing better, walked with PT. 2. Abdominal pain: Centered in RUQ, no peritoneal signs.  Pain improved today.  CT abdomen suggestive of acalculous cholecystitis.  HIDA scan yesterday suggestive of chronic cholecystitis.  Surgery following.  He is on Zosyn.  3. Atrial fibrillation: Permanent. HR controlled on Coreg.  - Continue Coreg.  - Hold coumadin for now in case procedure needed.  He is on heparin gtt with INR < 2. Restart coumadin if no procedure needed.  4. CAD: h/o CABG with AVR. No  chest pain or ischemic ECG changes. Elevated troponin (mildly) in 0.4 range, steady with no up-trend. I think that this likely represents demand ischemia likely in setting of inflammatory process (cholecystitis).  BP has also been low at times.   - heparin gtt now that INR< 2.  - ASA 81, statin, Coreg.  - Echo stable with normal EF and mechanical aortic valve.  - Will arrange for [facility] tomorrow to assess for ischemia (has to be 24 hrs after HIDA scan).  5. HTN: BP higher now.  6. Mechanical aortic valve: Stable on echo. Continue ASA 81, heparin gtt while coumadin held.   Loralie Champagne 09/04/2013 7:17 AM

## 2013-09-04 NOTE — Progress Notes (Signed)
[facility] PROGRESS NOTE  [name] [id number] DOB: 03/16/1934 DOA: 09/01/2013 PCP: Gennette Pac, MD  Assessment/Plan: Acalculus cholecystitis  -AST/ALT remain mildly elevated  -Lipase and Lactic acid neg. UA neg.  -patient also has history of RCC s/p two radiofrequency ablations.  - CT abd/pelvis--gallbladder wall thickening with pericholecystic fluid and trace biliary ductal dilatation  - General surgery consultation given suspicion of chronic cholecystitis on [place].  -HIDA--patent cystic duct, chronic cholecystitis  - continue zosyn  -diet was advance and pt tolerated today  -appreciate surgery input  -await stress test for possible surgery Progressive generalized weakness,  -No focal abnormalities  - Multifactorial including deconditioning as well as his infectious process and other electrolyte arrangements  - IVF  - PT-recommended home health PT - TSH--0.875  - Cortisol 14.1  CAD with mild elevation of troponin,  -myoview planned 09/05/13  -s/p CABG/AVR,  -likely demand ischemia  - ASA, statin, BB  -Appreciate Cardiology following  -restart demadex if okay with cardiology  Permanent atrial fibrillation  -on A/C and rate controlled on BB and dig  - Heparin gtt with INR < 2 for anticipation of surgery  -Echo EF 55-60%, well function AV prosthesis  Mechanical AVR  -IV heparin when INR <2 if surgery is anticipated  GERD,  -possible acid-reflux like symptoms  -Continue PPI  Leukocytosis  -Due to acute medical condition  -Improved  -Afebrile and hemodynamically stable  Family Communication: wife and daughter at beside  Disposition Plan: Home when medically stable   Antibiotics:  Zosyn 09/02/13>>>        Procedures/Studies: Dg Chest 2 View  09/01/2013   CLINICAL DATA:  Weakness. Atrial fibrillation. High blood pressure. Renal cell carcinoma. Prior smoker.  EXAM: CHEST  2 VIEW  COMPARISON:  06/16/2012.  FINDINGS: Post median sternotomy with  fracture of upper sternal wires. Post CABG. Heart size top-normal. Calcified mildly tortuous aorta.  T12 compression fracture with prior cement augmentation unchanged. No new compression fracture detected.  No infiltrate, congestive heart failure or pneumothorax.  No plain film evidence of pulmonary malignancy/metastatic disease.  IMPRESSION: Post median sternotomy with fracture of upper sternal wires. Post CABG. Heart size top-normal.  Calcified mildly tortuous aorta.  T12 compression fracture with prior cement augmentation unchanged. No new compression fracture detected.  No infiltrate, congestive heart failure or pneumothorax.  No plain film evidence of pulmonary malignancy/metastatic disease.   Electronically Signed   By: Chauncey Cruel M.D.   On: 09/01/2013 12:22   Nm Hepatobiliary Liver Func  09/03/2013   CLINICAL DATA:  Right upper quadrant pain  EXAM: NUCLEAR MEDICINE HEPATOBILIARY IMAGING  TECHNIQUE: Sequential images of the abdomen were obtained out to 60 minutes following intravenous administration of radiopharmaceutical.  COMPARISON:  None.  RADIOPHARMACEUTICALS:  5.12mCi Tc-76m Choletec  FINDINGS: Following the intravenous administration of the radiopharmaceutical there is uniform tracer uptake with clearance from blood pool. Tracer activity within the common bile duct and small bowel loops noted by 15 min. After 60 min no activity is noted within the gallbladder. The patient was given 4 mg of morphine sulfate, IV and imaging was carried out for an additional 30 min. 15 min after administering morphine radiotracer began accumulating within the gallbladder.  IMPRESSION: 1. Patent cystic duct without evidence for acute cholecystitis. 2. Delayed gallbladder filling compatible with chronic cholecystitis.   Electronically Signed   By: Kerby Moors M.D.   On: 09/03/2013 13:11   US Abdomen Complete  09/01/2013   CLINICAL DATA:  Right upper quadrant  pain  EXAM: ULTRASOUND ABDOMEN COMPLETE  COMPARISON:  None.   FINDINGS: Gallbladder:  There is mild gallbladder wall thickening to 6 mm. No pericholecystic fluid. There is dependent sludge within the gallbladder. Negative sonographic Murphy's sign.  Common bile duct:  Diameter: Normal diameter 4 mm.  Liver:  No focal lesion identified. Within normal limits in parenchymal echogenicity.  IVC:  No abnormality visualized.  Pancreas:  Visualized portion unremarkable.  Spleen:  Size and appearance within normal limits.  Right Kidney: 12.4cm in length. No evidence of hydronephrosis or stones. Anechoic 1.5 cm cyst in the upper pole. This is proteinaceous nonenhancing cyst on comparison CT.  Left Kidney:  Length: 12.7. Echogenicity within normal limits. No mass or hydronephrosis visualized.  Abdominal aorta:  No aneurysm visualized.  IMPRESSION:  Gallbladder sludge and gallbladder wall thickening without evidence of acute cholecystitis. The findings could represent chronic cholecystitis.   Electronically Signed   By: Suzy Bouchard M.D.   On: 09/01/2013 12:06   Ct Abdomen Pelvis W Contrast  09/01/2013   CLINICAL DATA:  Mid abdominal discomfort and generalized weakness, history of renal cell malignancy and radiofrequency ablation.  EXAM: CT ABDOMEN AND PELVIS WITH CONTRAST  TECHNIQUE: Multidetector CT imaging of the abdomen and pelvis was performed using the standard protocol following bolus administration of intravenous contrast.  CONTRAST:  169mL OMNIPAQUE IOHEXOL 300 MG/ML  SOLN  COMPARISON:  CT scan of the abdomen dated June 16, 2013.  FINDINGS: The gallbladder is abnormal in appearance. It is mildly distended and exhibits wall thickening and pericholecystic fluid. No calcified or noncalcified stones are demonstrated. The adjacent liver is normal in density and contour. There may be a trace of intrahepatic ductal dilation. The pancreas is somewhat atrophic and exhibits no evidence of inflammatory change or focal mass or ductal dilation. The stomach is moderately distended  with food particles. The spleen is not enlarged. There are no adrenal masses  The kidneys exhibit no evidence of obstruction. There are findings consistent with the previous radiofrequency ablation on the right. There is a small hypodense focus medial to the lower pole of the right kidney which is stable. It is demonstrated on image 52 of series 2. It measures 1.6 by a approximately 1 cm. A 2nd cortically based midpole hypodensity on the right is stable and is most compatible with a cyst. There is a stable 1.5 cm diameter hypodensity in the lateral aspect of the mid to upper pole of the right kidney demonstrated best on image 41 of series 2. In the medial aspect of the mid to upper pole on image 42 of series 2 there is a 1 cm diameter stable appearing hypodensity. On the left there is no evidence of obstruction or parenchymal masses. A subcentimeter hypodensity in the midpole laterally is consistent with a cyst.  On delayed images contrast within the renal collecting systems is within the limits of normal.  The caliber of the abdominal aorta is normal. The periaortic and pericaval regions also appear normal. The partially contrast filled loops of small and large bowel exhibit no evidence of ileus nor of obstruction. A normal calibered, partially contrast-filled, uninflamed-appearing appendix is demonstrated. Within the pelvis the urinary bladder is mildly distended. The prostate gland is mildly enlarged and produces an impression upon the urinary bladder base. The seminal vesicles and prostate gland appear normal. There is a small fat containing left inguinal hernia. There is a tiny fat containing umbilical hernia.  The right lung base demonstrates very mild subsegmental atelectasis  posteriorly. The lumbar vertebral bodies are preserved in height. There is degenerative disc change at multiple lumbar levels. There is partial compression of the body of T12 which appears to have been previously treated with  kyphoplasty.  IMPRESSION: 1. The appearance of the gallbladder is abnormal suggesting acalculous cholecystitis. Ultrasound of the gallbladder would be useful. 2. The appearance of both kidneys is stable with no evidence of obstruction. Post ablation changes on the right are present and stable. There are stable hypodensities remaining in the renal cortex of the right kidney. 3. No acute bowel abnormality is demonstrated. 4. There is mild distention of the urinary bladder which may be related to the mildly enlarged prostate gland. Foley catheterization would be useful.   Electronically Signed   By: Apostolos Blagg  [place]   On: 09/01/2013 19:08         Subjective: Patient denies fevers, chills, chest discomfort, shortness breath, nausea, vomiting, diarrhea, abdominal pain for dysuria, hematuria. No rashes. No headache.  Objective: Filed Vitals:   09/03/13 1408 09/03/13 2129 09/04/13 0603 09/04/13 1405  BP: 168/79 136/66 126/50 139/79  Pulse: 83 90 72 78  Temp: 97.3 F (36.3 C) 98.2 F (36.8 C) 97.3 F (36.3 C) 98.1 F (36.7 C)  TempSrc: Oral Oral Oral Oral  Resp: 20 20 18 18   Height:      Weight:      SpO2: 98% 94% 98% 99%    Intake/Output Summary (Last 24 hours) at 09/04/13 1819 Last data filed at 09/04/13 1815  Gross per 24 hour  Intake 3218.72 ml  Output   3100 ml  Net 118.72 ml   Weight change:  Exam:   General:  Pt is alert, follows commands appropriately, not in acute distress  HEENT: No icterus, No thrush,  [place]/AT  Cardiovascular: IRRR, S1/S2, no rubs, no gallops  Respiratory: CTA bilaterally, no wheezing, no crackles, no rhonchi  Abdomen: Soft/+BS, non tender, non distended, no guarding  Extremities: 2+ LE edema, No lymphangitis, No petechiae, No rashes, no synovitis  Data Reviewed: Basic Metabolic Panel:  Recent Labs Lab 09/01/13 1055 09/02/13 0523 09/02/13 1523 09/03/13 0511 09/04/13 0500  NA 131* 132* 130* 132* 135*  K 4.1 4.2 4.4 4.4 4.1  CL 92* 96 94* 96  97  CO2 26 26 26 24 26   GLUCOSE 142* 117* 118* 103* 117*  BUN 18 18 15 12 13   CREATININE 0.90 0.90 0.85 0.81 0.91  CALCIUM 9.2 9.2 9.2 9.4 9.5   Liver Function Tests:  Recent Labs Lab 09/01/13 1055 09/02/13 0523 09/02/13 1523 09/03/13 0511 09/04/13 0500  AST 75* 78* 93* 89* 62*  ALT 36 52 67* 76* 77*  ALKPHOS 90 101 102 112 114  BILITOT 1.2 0.8 0.8 0.7 0.5  PROT 6.3 6.0 6.1 6.0 6.1  ALBUMIN 3.2* 2.9* 3.0* 2.9* 2.9*    Recent Labs Lab 09/01/13 1055  LIPASE 17   No results found for this basename: AMMONIA,  in the last 168 hours CBC:  Recent Labs Lab 09/01/13 1055 09/02/13 0523 09/03/13 0511 09/04/13 0500  WBC 11.7* 9.3 7.0 6.9  NEUTROABS 9.2*  --   --   --   HGB 12.2* 11.2* 11.3* 11.6*  HCT 33.8* 32.9* 32.7* 32.7*  MCV 82.2 83.3 83.6 82.2  PLT 152 149* 144* 182   Cardiac Enzymes:  Recent Labs Lab 09/01/13 1055 09/01/13 1440 09/01/13 1805 09/01/13 2027  TROPONINI 0.47* 0.46* 0.49* 0.44*   BNP: No components found with this basename: POCBNP,  CBG:  Recent Labs Lab 09/02/13 2157 09/03/13 0736  GLUCAP 134* 110*    No results found for this or any previous visit (from the past 240 hour(s)).   Scheduled Meds: . aspirin EC  81 mg Oral Daily  . atorvastatin  40 mg Oral Daily  . carvedilol  25 mg Oral BID WC  . darifenacin  15 mg Oral Daily  . digoxin  0.125 mg Oral Daily  . docusate sodium  100 mg Oral BID  . multivitamin with minerals  1 tablet Oral Daily  . pantoprazole (PROTONIX) IV  40 mg Intravenous Q12H  . piperacillin-tazobactam (ZOSYN)  IV  3.375 g Intravenous Q8H  . potassium chloride  10 mEq Oral Daily  . senna  1 tablet Oral BID  . sodium chloride  3 mL Intravenous Q12H  . tamsulosin  0.4 mg Oral Daily  . vitamin B-12  1,000 mcg Oral Daily   Continuous Infusions: . sodium chloride 50 mL/hr at 09/04/13 1737  . heparin 1,900 Units/hr (09/04/13 1737)     Shirah Roseman, DO  [facility] Pager [phone]  If 7PM-7AM, please  contact night-coverage [email or web address] Password [email or web address] 09/04/2013, 6:19 PM   LOS: 3 days

## 2013-09-04 NOTE — Progress Notes (Signed)
PHARMACY BRIEF NOTE - Anticoagulation  Consult for:  IV Heparin Indication:  Atrial fibrillation, mechanical AVR, Coumadin on hold for possible surgery  With the infusion of 1900 units/hr, the Heparin level drawn at  14:47 is reported as 0.48 units/ml. This level is within the therapeutic range, 0.3-0.7 units/ml.  Plan:  Continue the current infusion rate.  Repeat the Heparin level in 6 hours to confirm.  ProsperityPh. 09/04/2013 4:57 PM

## 2013-09-04 NOTE — Progress Notes (Signed)
OT Cancellation Note  Patient Details Name: [name] MRN: [id number] DOB: [birth year]   Cancelled Treatment:    Reason Eval/Treat Not Completed: Other (comment) PA in room with pt when OT initially came by. Will try back later as schedule allows.  Jules Schick [phone] 09/04/2013, 12:48 PM

## 2013-09-04 NOTE — Progress Notes (Signed)
Physical Therapy Treatment Patient Details Name: [name] MRN: [id number] DOB: 1934/02/01 Today's Date: 09/04/2013 Time: 4259-5638 PT Time Calculation (min): 26 min  PT Assessment / Plan / Recommendation  History of Present Illness 80 yo with history of CABG + mechanical AVR in 10/13 and permanent atrial fibrillation on warfarin came to the ER today with diffuse weakness and abdominal pain. Until last Friday, patient was at his baseline. He is not very active but had no exertional chest pain or significant exertional dyspnea. On Friday, he was very weak. He slipped and slid to the ground. He was unable to get up. Family got him in bed. Since that time, he has been profoundly weak with minimal ability to ambulate. He has been in bed. On Friday, wife says that he had visual hallucinations though he is not having these anymore. The weakness is generalized with no focality. He also has had upper abdominal pain fairly constantly for 1-2 weeks. It is relatively mild and is localized to the RUQ. He is tender in the RUQ. The ER MD did an abdominal US and said it looked like "chronic cholecystitis." He has had no chest pain. No fever/chills/nausea/diarrhea/cough/congestion. CXR did not show PNA. UA did not show UTI. He was noted to have a mildly elevated troponin level so cardiology was called. No acute changes on ECG (chronic atrial fibrillation).     PT Comments   Pt feeling better.  Amb in hallway twice a good distance with one sitting rest break.  Pt demon increased balance/stability. Pt plans to D/C to home.    Follow Up Recommendations  Home health PT     Does the patient have the potential to tolerate intense rehabilitation     Barriers to Discharge        Equipment Recommendations  None recommended by PT    Recommendations for Other Services    Frequency Min 3X/week   Progress towards PT Goals Progress towards PT goals: Progressing toward goals  Plan      Precautions / Restrictions  Precautions Precautions: Fall Restrictions Weight Bearing Restrictions: No    Pertinent Vitals/Pain No c/o pain    Mobility  Bed Mobility Overal bed mobility: Modified Independent General bed mobility comments: increased time Transfers Overall transfer level: Needs assistance Equipment used: Rolling walker (2 wheeled) Transfers: Sit to/from [employer] Sit to Stand: Supervision;Min guard Stand pivot transfers: Supervision;Min guard General transfer comment: one VC on safety with turns Ambulation/Gait Ambulation/Gait assistance: Min assist Ambulation Distance (Feet): 500 Feet (250 feet x 2 one sitting rest break) Assistive device: Rolling walker (2 wheeled) Gait Pattern/deviations: Step-through pattern Gait velocity: decreased General Gait Details: noted increase balance and decreased L drift.  Increased activity tolerance. One VC on safety with turns.      PT Goals (current goals can now be found in the care plan section)    Visit Information  Last PT Received On: 09/04/13 Assistance Needed: +1 History of Present Illness: 80 yo with history of CABG + mechanical AVR in 10/13 and permanent atrial fibrillation on warfarin came to the ER today with diffuse weakness and abdominal pain. Until last Friday, patient was at his baseline. He is not very active but had no exertional chest pain or significant exertional dyspnea. On Friday, he was very weak. He slipped and slid to the ground. He was unable to get up. Family got him in bed. Since that time, he has been profoundly weak with minimal ability to ambulate. He has been  in bed. On Friday, wife says that he had visual hallucinations though he is not having these anymore. The weakness is generalized with no focality. He also has had upper abdominal pain fairly constantly for 1-2 weeks. It is relatively mild and is localized to the RUQ. He is tender in the RUQ. The ER MD did an abdominal US and said it looked like "chronic  cholecystitis." He has had no chest pain. No fever/chills/nausea/diarrhea/cough/congestion. CXR did not show PNA. UA did not show UTI. He was noted to have a mildly elevated troponin level so cardiology was called. No acute changes on ECG (chronic atrial fibrillation).      Subjective Data      Cognition       Balance     End of Session PT - End of Session Equipment Utilized During Treatment: Gait belt Activity Tolerance: Patient tolerated treatment well Patient left: in chair;with call bell/phone within reach;with family/visitor present   Rica Koyanagi  PTA [facility]  Acute  Rehab Pager      [phone]

## 2013-09-04 NOTE — Progress Notes (Signed)
Occupational Therapy Treatment Patient Details Name: [name] MRN: [id number] DOB: [birth year] Today's Date: 09/04/2013 Time: 1451-1510 OT Time Calculation (min): 19 min  OT Assessment / Plan / Recommendation  History of present illness 80 yo with history of CABG + mechanical AVR in 10/13 and permanent atrial fibrillation on warfarin came to the ER today with diffuse weakness and abdominal pain. Until last Friday, patient was at his baseline. He is not very active but had no exertional chest pain or significant exertional dyspnea. On Friday, he was very weak. He slipped and slid to the ground. He was unable to get up. Family got him in bed. Since that time, he has been profoundly weak with minimal ability to ambulate. He has been in bed. On Friday, wife says that he had visual hallucinations though he is not having these anymore. The weakness is generalized with no focality. He also has had upper abdominal pain fairly constantly for 1-2 weeks. It is relatively mild and is localized to the RUQ. He is tender in the RUQ. The ER MD did an abdominal US and said it looked like "chronic cholecystitis." He has had no chest pain. No fever/chills/nausea/diarrhea/cough/congestion. CXR did not show PNA. UA did not show UTI. He was noted to have a mildly elevated troponin level so cardiology was called. No acute changes on ECG (chronic atrial fibrillation).     OT comments  Pt continues to need to work on safety with walker for adls  Follow Up Recommendations  Home health OT    Barriers to Discharge       Equipment Recommendations  None recommended by OT    Recommendations for Other Services    Frequency Min 2X/week   Progress towards OT Goals Progress towards OT goals: Progressing toward goals  Plan      Precautions / Restrictions Precautions Precautions: Fall Restrictions Weight Bearing Restrictions: No   Pertinent Vitals/Pain No pain    ADL  Grooming: Min guard;Teeth care Where Assessed  - Grooming: Supported standing Toilet Transfer: Magazine features editor Method: Sit to Loss adjuster, chartered: Bedside commode Transfers/Ambulation Related to ADLs: cues for ambulation to bathroom for safety:  pt tends to "park" walker away from him and lift up to carry in tight spaces.  Cued for safety ADL Comments: wife assisted pt to put underwear on.  Pt wanted to shave with regular razor :  on heparin drip so did not perform    OT Diagnosis:    OT Problem List:   OT Treatment Interventions:     OT Goals(current goals can now be found in the care plan section)    Visit Information  Last OT Received On: 09/04/13 Assistance Needed: +1 Reason Eval/Treat Not Completed: Other (comment) History of Present Illness: 80 yo with history of CABG + mechanical AVR in 10/13 and permanent atrial fibrillation on warfarin came to the ER today with diffuse weakness and abdominal pain. Until last Friday, patient was at his baseline. He is not very active but had no exertional chest pain or significant exertional dyspnea. On Friday, he was very weak. He slipped and slid to the ground. He was unable to get up. Family got him in bed. Since that time, he has been profoundly weak with minimal ability to ambulate. He has been in bed. On Friday, wife says that he had visual hallucinations though he is not having these anymore. The weakness is generalized with no focality. He also has had upper abdominal pain fairly constantly  for 1-2 weeks. It is relatively mild and is localized to the RUQ. He is tender in the RUQ. The ER MD did an abdominal US and said it looked like "chronic cholecystitis." He has had no chest pain. No fever/chills/nausea/diarrhea/cough/congestion. CXR did not show PNA. UA did not show UTI. He was noted to have a mildly elevated troponin level so cardiology was called. No acute changes on ECG (chronic atrial fibrillation).      Subjective Data      Prior Functioning        Cognition  Cognition Arousal/Alertness: Awake/alert Behavior During Therapy: WFL for tasks assessed/performed Memory: Decreased short-term memory    Mobility  Bed Mobility Overal bed mobility: Modified Independent General bed mobility comments: increased time Transfers Overall transfer level: Needs assistance Equipment used: Rolling walker (2 wheeled) Transfers: Sit to/from [employer] Transfers Sit to Stand: Supervision;Min guard Stand pivot transfers: Supervision;Min guard General transfer comment: one VC on safety with turns    Exercises      Balance    End of Session OT - End of Session Activity Tolerance: Patient tolerated treatment well Patient left: in bed;with call bell/phone within reach;with family/visitor present;with bed alarm set  GO     Suzi Hernan 09/04/2013, 3:33 PM Lesle Chris, OTR/L [phone] 09/04/2013

## 2013-09-04 NOTE — Progress Notes (Signed)
Patient's son approached this RN requesting that further plans of treatment not be discussed solely when patient and his wife are in room due to patient's memory impairment and their difficulty comprehending information from physicians, which causes patient and wife to become confused.  Have noted such in patient's chart under 'physician sticky notes' and in progress note.

## 2013-09-04 NOTE — Progress Notes (Signed)
Patient ID: [name], male   DOB: 1934-05-17, 80 y.o.   MRN: [id number]    Subjective: Pt feels ok.  No major abdominal complaints.  He and his wife got quite upset when surgery was mentioned as apparently they have been told he will ABSOLUTELY not get an operation.  Objective: Vital signs in last 24 hours: Temp:  [97.3 F (36.3 C)-98.2 F (36.8 C)] 97.3 F (36.3 C) (01/09 0603) Pulse Rate:  [72-90] 72 (01/09 0603) Resp:  [18-20] 18 (01/09 0603) BP: (126-168)/(50-79) 126/50 mmHg (01/09 0603) SpO2:  [94 %-98 %] 98 % (01/09 0603) Last BM Date: 09/03/13  Intake/Output from previous day: 01/08 0701 - 01/09 0700 In: 2422.5 [P.O.:840; I.V.:1432.5; IV Piggyback:150] Out: 1700 [Urine:1700] Intake/Output this shift: Total I/O In: -  Out: 400 [Urine:400]  PE: Abd: soft, NT, ND, +BS  Lab Results:   Recent Labs  09/03/13 0511 09/04/13 0500  WBC 7.0 6.9  HGB 11.3* 11.6*  HCT 32.7* 32.7*  PLT 144* 182   BMET  Recent Labs  09/03/13 0511 09/04/13 0500  NA 132* 135*  K 4.4 4.1  CL 96 97  CO2 24 26  GLUCOSE 103* 117*  BUN 12 13  CREATININE 0.81 0.91  CALCIUM 9.4 9.5   PT/INR  Recent Labs  09/03/13 0511 09/04/13 0500  LABPROT 21.7* 19.9*  INR 1.96* 1.75*   CMP     Component Value Date/Time   NA 135* 09/04/2013 0500   K 4.1 09/04/2013 0500   CL 97 09/04/2013 0500   CO2 26 09/04/2013 0500   GLUCOSE 117* 09/04/2013 0500   BUN 13 09/04/2013 0500   CREATININE 0.91 09/04/2013 0500   CREATININE 0.84 06/03/2013 0932   CALCIUM 9.5 09/04/2013 0500   PROT 6.1 09/04/2013 0500   ALBUMIN 2.9* 09/04/2013 0500   AST 62* 09/04/2013 0500   ALT 77* 09/04/2013 0500   ALKPHOS 114 09/04/2013 0500   BILITOT 0.5 09/04/2013 0500   GFRNONAA 80* 09/04/2013 0500   GFRAA >90 09/04/2013 0500   Lipase     Component Value Date/Time   LIPASE 17 09/01/2013 1055       Studies/Results: Nm Hepatobiliary Liver Func  09/03/2013   CLINICAL DATA:  Right upper quadrant pain  EXAM: NUCLEAR MEDICINE HEPATOBILIARY  IMAGING  TECHNIQUE: Sequential images of the abdomen were obtained out to 60 minutes following intravenous administration of radiopharmaceutical.  COMPARISON:  None.  RADIOPHARMACEUTICALS:  5.52mCi Tc-34m Choletec  FINDINGS: Following the intravenous administration of the radiopharmaceutical there is uniform tracer uptake with clearance from blood pool. Tracer activity within the common bile duct and small bowel loops noted by 15 min. After 60 min no activity is noted within the gallbladder. The patient was given 4 mg of morphine sulfate, IV and imaging was carried out for an additional 30 min. 15 min after administering morphine radiotracer began accumulating within the gallbladder.  IMPRESSION: 1. Patent cystic duct without evidence for acute cholecystitis. 2. Delayed gallbladder filling compatible with chronic cholecystitis.   Electronically Signed   By: Kerby Moors M.D.   On: 09/03/2013 13:11    Anti-infectives: Anti-infectives   Start     Dose/Rate Route Frequency Ordered Stop   09/02/13 1800  piperacillin-tazobactam (ZOSYN) IVPB 3.375 g     3.375 g 12.5 mL/hr over 240 Minutes Intravenous Every 8 hours 09/02/13 1719         Assessment/Plan  1. Chronic cholecystitis Patient Active Problem List   Diagnosis Date Noted  . Abdominal  pain, other specified site 09/01/2013  . Constipation 09/01/2013  . Hypotension 09/01/2013  . Leukocytosis 09/01/2013  . Normocytic anemia 09/01/2013  . Hyponatremia 09/01/2013  . Bruit 04/21/2013  . Encounter for long-term (current) use of anticoagulants 11/22/2010  . Chronic diastolic heart failure 47/82/9562  . Aortic valve disorder 10/23/2010  . EDEMA 04/26/2010  . DYSPNEA 04/26/2010  . CHEST PAIN 04/26/2010  . HYPERLIPIDEMIA 01/21/2009  . HYPOKALEMIA 01/21/2009  . HYPERTENSION 01/21/2009  . CAD 01/21/2009  . Atrial fibrillation 01/21/2009  . ALTERED MENTAL STATUS 01/21/2009  . AORTIC VALVE REPLACEMENT, HX OF 01/21/2009   Plan: 1. I explained  the patient's HIDA scan to him and his wife.  He does not have acute cholecystitis, but does have chronic cholecystitis.  I discussed the possibility that if he does not have an operation for his gallbladder, whether that be now or in the near future, if clear by cards, that he will likely continue to have abdominal symptoms from his gallbladder.  I discussed that we would wait and see what his stress test showed prior to making any decisions.  Apparently, the patient and wife state they had been told he would definitely not get an operation and were irate at my mention of possibly undergoing an operation.  I then explained that if his heart was ok, they could make a decision on whether to proceed with conservative measures or whether to pursue a cholecystectomy.  At that time, I was told by the wife to go ahead and leave that I was only upsetting the patient even further.   LOS: 3 days    Thomasina Housley E 09/04/2013, 12:15 PM Pager: [phone]

## 2013-09-04 NOTE — Care Management Note (Addendum)
    Page 1 of 2   09/10/2013     2:48:21 PM   CARE MANAGEMENT NOTE 09/10/2013  Patient:  [name], [name]   Account Number:  [id number]  Date Initiated:  09/04/2013  Documentation initiated by:  Dessa Phi  Subjective/Objective Assessment:   80 Y/O M ADMITTED W/ABD PAIN.     Action/Plan:   FROM HOME W/SPOUSE.HAS PCP,PHARMACY.   Anticipated DC Date:  09/10/2013   Anticipated DC Plan:  [place]  CM consult      Choice offered to / List presented to:  C-1 Patient        [place] arranged  [place] RN  HH-3 OT      [place].   Status of service:  Completed, signed off Medicare Important Message given?   (If response is "NO", the following Medicare IM given date fields will be blank) Date Medicare IM given:   Date Additional Medicare IM given:    Discharge Disposition:  [place]  Per UR Regulation:  Reviewed for med. necessity/level of care/duration of stay  If discussed at [place] of Stay Meetings, dates discussed:   09/08/2013  09/10/2013    Comments:  09/10/13 Anjolaoluwa Siguenza RN,BSN NCM [phone] [facility] AWARE OF HHRN-INR CHECKS SEND RESULTS TO COUMADIN CLINIC,HHPT/OT.  IF HHRN NEEDED FOR INR CHECKS CAN ARRANGE.  09/08/13 Bristyl Mclees RN,BSN NCM [phone] POD#1 CATH.NOW FOR LAP CHOLE TODAY.[facility] HHPT ALREADY ORDERED,AWAIT D/C.  09/07/13 Jaspreet Bodner RN,BSN NCM [phone] CARDIO-MYOVIEW-[place]HE[place].L HEART CATH,BYPASS.SX FOLLOWING-?SURGERY.[facility] FOLLOWING FOR HHPT ORDER.  09/04/13 Artis Beggs RN,BSN NCM [phone] PT-HH.[facility] CHOSEN FOR HHPT.TC KRISTEN [facility] REP,AWARE OF REFERRAL, & FOLLOWING FOR HHPT ORDER.AWAIT FINAL HHPT ORDER.GI/SX/CARDIO FOLLOWING.ACALCULUS CHOLECYSTITIS.S/P HIDA SCAN,MYOVIEW IN AM.IV ABX,IV PROTONIX,INR 1.75-IV HEPARIN.

## 2013-09-04 NOTE — Progress Notes (Signed)
PHARMACY BRIEF NOTE - Anticoagulation  Consult for:  IV Heparin Indication:  Atrial fibrillation, mechanical AVR, Coumadin on hold for possible surgery  With the infusion of 1900 units/hr, the Heparin level drawn at  20:44 is reported as 0.36 units/ml.  The level remains within the therapeutic range.  Plan:  Continue the same infusion rate overnight.  Next level with morning labs 1/10.  [place]ylwoodPh. 09/04/2013 10:01 PM

## 2013-09-04 NOTE — Progress Notes (Signed)
Seen and agree  

## 2013-09-05 ENCOUNTER — Inpatient Hospital Stay (HOSPITAL_COMMUNITY)
Admit: 2013-09-05 | Discharge: 2013-09-05 | Disposition: A | Discharge status: Home / Self Care | Payer: Medicare Other | Attending: Cardiology | Admitting: Cardiology

## 2013-09-05 ENCOUNTER — Ambulatory Visit (HOSPITAL_COMMUNITY)
Admit: 2013-09-05 | Discharge: 2013-09-05 | Disposition: A | Discharge status: Home / Self Care | Payer: Medicare Other | Source: Ambulatory Visit | Attending: Cardiology | Admitting: Cardiology

## 2013-09-05 ENCOUNTER — Other Ambulatory Visit: Payer: Self-pay

## 2013-09-05 DIAGNOSIS — R748 Abnormal levels of other serum enzymes: Secondary | ICD-10-CM

## 2013-09-05 DIAGNOSIS — I10 Essential (primary) hypertension: Secondary | ICD-10-CM

## 2013-09-05 DIAGNOSIS — Z951 Presence of aortocoronary bypass graft: Secondary | ICD-10-CM | POA: Insufficient documentation

## 2013-09-05 DIAGNOSIS — I2510 Atherosclerotic heart disease of native coronary artery without angina pectoris: Secondary | ICD-10-CM

## 2013-09-05 LAB — CBC
HCT: 34.2 % — ABNORMAL LOW (ref 39.0–52.0)
Hemoglobin: 11.7 g/dL — ABNORMAL LOW (ref 13.0–17.0)
MCH: 28.6 pg (ref 26.0–34.0)
MCHC: 34.2 g/dL (ref 30.0–36.0)
MCV: 83.6 fL (ref 78.0–100.0)
Platelets: 187 10*3/uL (ref 150–400)
RBC: 4.09 MIL/uL — AB (ref 4.22–5.81)
RDW: 12.5 % (ref 11.5–15.5)
WBC: 6.9 10*3/uL (ref 4.0–10.5)

## 2013-09-05 LAB — COMPREHENSIVE METABOLIC PANEL
ALBUMIN: 2.9 g/dL — AB (ref 3.5–5.2)
ALT: 73 U/L — AB (ref 0–53)
AST: 47 U/L — AB (ref 0–37)
Alkaline Phosphatase: 109 U/L (ref 39–117)
BILIRUBIN TOTAL: 0.4 mg/dL (ref 0.3–1.2)
BUN: 9 mg/dL (ref 6–23)
CHLORIDE: 100 meq/L (ref 96–112)
CO2: 26 mEq/L (ref 19–32)
Calcium: 9.4 mg/dL (ref 8.4–10.5)
Creatinine, Ser: 0.9 mg/dL (ref 0.50–1.35)
GFR calc Af Amer: >90 mL/min (ref >90)
GFR, EST NON AFRICAN AMERICAN: 80 mL/min — AB (ref >90)
Glucose, Bld: 112 mg/dL — ABNORMAL HIGH (ref 70–99)
Potassium: 4.2 mEq/L (ref 3.7–5.3)
SODIUM: 137 meq/L (ref 137–147)
Total Protein: 6 g/dL (ref 6.0–8.3)

## 2013-09-05 LAB — PROTIME-INR
INR: 1.42 (ref 0.00–1.49)
PROTHROMBIN TIME: 17 s — AB (ref 11.6–15.2)

## 2013-09-05 LAB — HEPARIN LEVEL (UNFRACTIONATED): Heparin Unfractionated: 0.53 IU/mL (ref 0.30–0.70)

## 2013-09-05 MED ORDER — TECHNETIUM TC 99M SESTAMIBI GENERIC - CARDIOLITE
10.0000 | Freq: Once | INTRAVENOUS | Status: AC | PRN
  Administered 2013-09-05: 10 via INTRAVENOUS

## 2013-09-05 MED ORDER — TECHNETIUM TC 99M SESTAMIBI GENERIC - CARDIOLITE
30.0000 | Freq: Once | INTRAVENOUS | Status: AC | PRN
  Administered 2013-09-05: 30 via INTRAVENOUS

## 2013-09-05 MED ORDER — REGADENOSON 0.4 MG/5ML IV SOLN
INTRAVENOUS | Status: AC
  Administered 2013-09-05: 0.4 mg
  Filled 2013-09-05: qty 5

## 2013-09-05 MED ORDER — TECHNETIUM TC 99M SESTAMIBI - CARDIOLITE: 30.0000 | Freq: Once | INTRAVENOUS | Status: AC | PRN

## 2013-09-05 NOTE — Progress Notes (Signed)
ANTICOAGULATION CONSULT NOTE - Follow up  Pharmacy Consult for Heparin Indication: St Jude mechanical aortic valve  No Known Allergies  Patient Measurements: Height: 6\' 4"  (193 cm) Weight: 228 lb 6.3 oz (103.6 kg) IBW/kg (Calculated) : 86.8 Heparin Dosing Weight: 103.6 kg  Vital Signs: Temp: 97.9 F (36.6 C) (01/10 0549) Temp src: Oral (01/10 0549) BP: 145/81 mmHg (01/10 0549) Pulse Rate: 64 (01/10 0549)  Labs:  Recent Labs  09/03/13 0511  09/04/13 0500 09/04/13 1447 09/04/13 2044 09/05/13 0530  HGB 11.3*  --  11.6*  --   --  11.7*  HCT 32.7*  --  32.7*  --   --  34.2*  PLT 144*  --  182  --   --  187  LABPROT 21.7*  --  19.9*  --   --  17.0*  INR 1.96*  --  1.75*  --   --  1.42  HEPARINUNFRC  --   < > 0.12* 0.48 0.36 0.53  CREATININE 0.81  --  0.91  --   --  0.90  < > = values in this interval not displayed.  Estimated Creatinine Clearance: 84.4 ml/min (by C-G formula based on Cr of 0.9).   Medical History: Past Medical History  Diagnosis Date  . Hypokalemia   . Hyperlipidemia   . Atrial fibrillation     longterm persistent  . CAD (coronary artery disease)   . HTN (hypertension)   . Shortness of breath     PT STATES RELATED TO HIS AF AND HEART BEING OUT OF RHYTHM  . Back pain, chronic     SINCE BACK INJURY / FRACTURE  . GERD (gastroesophageal reflux disease)   . Arthritis   . Cough 06/16/12    C/O OF COUGH / COLD FOR A COUPLE OF WEEKS  . Cancer     RIGHT RENAL  . Aortic valve replaced   . Stomach ulcer   . Constipation   . BPH (benign prostatic hyperplasia)     grapey    Medications:  Scheduled:  . aspirin EC  81 mg Oral Daily  . atorvastatin  40 mg Oral Daily  . carvedilol  25 mg Oral BID WC  . darifenacin  15 mg Oral Daily  . digoxin  0.125 mg Oral Daily  . docusate sodium  100 mg Oral BID  . multivitamin with minerals  1 tablet Oral Daily  . pantoprazole (PROTONIX) IV  40 mg Intravenous Q12H  . piperacillin-tazobactam (ZOSYN)  IV  3.375  g Intravenous Q8H  . potassium chloride  10 mEq Oral Daily  . senna  1 tablet Oral BID  . sodium chloride  3 mL Intravenous Q12H  . tamsulosin  0.4 mg Oral Daily  . vitamin B-12  1,000 mcg Oral Daily   Infusions:  . sodium chloride 50 mL/hr at 09/04/13 1737  . heparin 1,900 Units/hr (09/05/13 0530)    Assessment: 80 yo male on chronic warfarin for mechanical St. Jude AVR and afib. Warfarin is now on hold for possible surgery for acute cholecystitis, HIDA scan pending. Now that INR is < 2.0, Cardiology has ordered to begin IV heparin per Pharmacy.  Heparin level is within target range on current rate.  CBC stable. No bleeding reported/documented.  Concurrent aspirin 81mg  daily noted.  Goal of Therapy:  Heparin level 0.3-0.7 units/ml Monitor platelets by anticoagulation protocol: Yes   Plan:   Continue heparin at 1900 units/hr (19 units/kg/hr)  Daily heparin level, CBC  F/u plans for  resuming warfarin if no procedures needed  Romeo Rabon, PharmD, pager [phone]. 09/05/2013,7:16 AM.

## 2013-09-05 NOTE — Progress Notes (Signed)
Subjective: No abdominal pain.  Eating a solid diet without difficulty.  Objective: Vital signs in last 24 hours: Temp:  [97.9 F (36.6 C)-98.1 F (36.7 C)] 97.9 F (36.6 C) (01/10 0549) Pulse Rate:  [64-68] 64 (01/10 0549) Resp:  [18] 18 (01/10 1300) BP: (142-192)/(48-81) 157/69 mmHg (01/10 1300) SpO2:  [98 %-99 %] 98 % (01/10 1300) Last BM Date: 09/03/13  Intake/Output from previous day: 01/09 0701 - 01/10 0700 In: 2689 [P.O.:960; I.V.:1579; IV Piggyback:150] Out: 2775 [Urine:2775] Intake/Output this shift:    PE: General- In NAD Abdomen-soft, nontender, gallbladder not palpable  Lab Results:   Recent Labs  09/04/13 0500 09/05/13 0530  WBC 6.9 6.9  HGB 11.6* 11.7*  HCT 32.7* 34.2*  PLT 182 187   BMET  Recent Labs  09/04/13 0500 09/05/13 0530  NA 135* 137  K 4.1 4.2  CL 97 100  CO2 26 26  GLUCOSE 117* 112*  BUN 13 9  CREATININE 0.91 0.90  CALCIUM 9.5 9.4   PT/INR  Recent Labs  09/04/13 0500 09/05/13 0530  LABPROT 19.9* 17.0*  INR 1.75* 1.42   Comprehensive Metabolic Panel:    Component Value Date/Time   NA 137 09/05/2013 0530   K 4.2 09/05/2013 0530   CL 100 09/05/2013 0530   CO2 26 09/05/2013 0530   BUN 9 09/05/2013 0530   CREATININE 0.90 09/05/2013 0530   CREATININE 0.84 06/03/2013 0932   GLUCOSE 112* 09/05/2013 0530   CALCIUM 9.4 09/05/2013 0530   AST 47* 09/05/2013 0530   ALT 73* 09/05/2013 0530   ALKPHOS 109 09/05/2013 0530   BILITOT 0.4 09/05/2013 0530   PROT 6.0 09/05/2013 0530   ALBUMIN 2.9* 09/05/2013 0530     Studies/Results: Nm Myocar Multi W/spect W/wall Motion / Ef  09/05/2013   CLINICAL DATA:  Cholecystitis and history of coronary artery disease with prior CABG. Mildly elevated troponin level.  EXAM: MYOCARDIAL IMAGING WITH SPECT (REST AND PHARMACOLOGIC-STRESS)  GATED LEFT VENTRICULAR WALL MOTION STUDY  LEFT VENTRICULAR EJECTION FRACTION  TECHNIQUE: Standard myocardial SPECT imaging was performed after resting intravenous  injection of 10 mCi Tc-60m sestamibi. Subsequently, intravenous infusion of Lexiscan was performed under the supervision of the Cardiology staff. At peak effect of the drug, 30 mCi Tc-56m sestamibi was injected intravenously and standard myocardial SPECT imaging was performed. Quantitative gated imaging was also performed to evaluate left ventricular wall motion, and estimate left ventricular ejection fraction.  COMPARISON:  None.  FINDINGS: Utilizing gated data, the end-diastolic volume is estimated to be 125 mL and the end systolic volume 44 mL. Calculated ejection fraction is 65%.  Gated wall motion analysis demonstrates mild septal hypokinesis which is likely secondary to prior CABG. No other wall motion abnormalities are identified.  SPECT imaging shows reversibility at the level of the mid to basal septum extending into the inferoseptal wall and consistent with inducible ischemia. No other fixed or reversible perfusion defects are identified.  IMPRESSION: 1. Reversibility demonstrated at the level of the mid to basal septum and adjacent inferoseptal wall. Findings are consistent with inducible ischemia. No other perfusion defects are identified. 2. Normal left ventricular function with quantitative ejection fraction calculation of 65%. Mild septal hypokinesis can be attributed to prior CABG.   Electronically Signed   By: Aletta Edouard M.D.   On: 09/05/2013 13:40    Anti-infectives: Anti-infectives   Start     Dose/Rate Route Frequency Ordered Stop   09/02/13 1800  piperacillin-tazobactam (ZOSYN) IVPB 3.375 g  3.375 g 12.5 mL/hr over 240 Minutes Intravenous Every 8 hours 09/02/13 1719        Assessment Acalculous cholecystitis-asx and tolerating diet CAD-Stress test positive.  Cardiac cath planned for next week.    LOS: 4 days   Plan: No urgent surgery planned at this time until cardiac work up and treatment is complete.   [name] 09/05/2013

## 2013-09-05 NOTE — Progress Notes (Signed)
[facility] PROGRESS NOTE  [name] ZYS:[id number] DOB: Nov 16, 1933 DOA: 09/01/2013 PCP: Gennette Pac, MD  Assessment/Plan: Acalculus cholecystitis  -AST/ALT remain mildly elevated  -Lipase and Lactic acid neg. UA neg.  -patient also has history of RCC s/p two radiofrequency ablations.  - CT abd/pelvis--gallbladder wall thickening with pericholecystic fluid and trace biliary ductal dilatation  - General surgery consultation given suspicion of chronic cholecystitis  -HIDA--patent cystic duct, chronic cholecystitis  - continue zosyn  -diet was advance and pt tolerated today  -appreciate surgery input  -no surgery in immediate future due to abnormal nuclear stress test  CAD with mild elevation of troponin/abnormal nuclear stress test  -myoview--reveals inducible ischemia, EF 65% -Heart catheterization planned 09/07/2013 -s/p CABG/AVR,  - ASA, statin, BB  -Appreciate Cardiology following  -restart demadex if okay with cardiology  Permanent atrial fibrillation  -on A/C and rate controlled on BB and dig  - Heparin gtt with INR < 2 for anticipation of surgery  -Echo EF 55-60%, well function AV prosthesis  Progressive generalized weakness -overall improving during hospitalization -No focal abnormalities  - Multifactorial including deconditioning as well as his infectious process and other electrolyte arrangements  - IVF  - PT-recommended home health PT  - TSH--0.875  - Cortisol 14.1  Mechanical AVR  -IV heparin when INR <2 if surgery is anticipated  GERD,  -possible acid-reflux like symptoms  -Continue PPI  Leukocytosis  -Due to acute medical condition  -Improved  -Afebrile and hemodynamically stable  Family Communication: wife and daughter at beside  Disposition Plan: Home when medically stable  Antibiotics:  Zosyn 09/02/13>>>         Procedures/Studies: Dg Chest 2 View  09/01/2013   CLINICAL DATA:  Weakness. Atrial fibrillation. High blood pressure.  Renal cell carcinoma. Prior smoker.  EXAM: CHEST  2 VIEW  COMPARISON:  06/16/2012.  FINDINGS: Post median sternotomy with fracture of upper sternal wires. Post CABG. Heart size top-normal. Calcified mildly tortuous aorta.  T12 compression fracture with prior cement augmentation unchanged. No new compression fracture detected.  No infiltrate, congestive heart failure or pneumothorax.  No plain film evidence of pulmonary malignancy/metastatic disease.  IMPRESSION: Post median sternotomy with fracture of upper sternal wires. Post CABG. Heart size top-normal.  Calcified mildly tortuous aorta.  T12 compression fracture with prior cement augmentation unchanged. No new compression fracture detected.  No infiltrate, congestive heart failure or pneumothorax.  No plain film evidence of pulmonary malignancy/metastatic disease.   Electronically Signed   By: Chauncey Cruel M.D.   On: 09/01/2013 12:22   Nm Hepatobiliary Liver Func  09/03/2013   CLINICAL DATA:  Right upper quadrant pain  EXAM: NUCLEAR MEDICINE HEPATOBILIARY IMAGING  TECHNIQUE: Sequential images of the abdomen were obtained out to 60 minutes following intravenous administration of radiopharmaceutical.  COMPARISON:  None.  RADIOPHARMACEUTICALS:  5.20mCi Tc-72m Choletec  FINDINGS: Following the intravenous administration of the radiopharmaceutical there is uniform tracer uptake with clearance from blood pool. Tracer activity within the common bile duct and small bowel loops noted by 15 min. After 60 min no activity is noted within the gallbladder. The patient was given 4 mg of morphine sulfate, IV and imaging was carried out for an additional 30 min. 15 min after administering morphine radiotracer began accumulating within the gallbladder.  IMPRESSION: 1. Patent cystic duct without evidence for acute cholecystitis. 2. Delayed gallbladder filling compatible with chronic cholecystitis.   Electronically Signed   By: Kerby Moors M.D.   On: 09/03/2013 13:11   US  Abdomen Complete  09/01/2013   CLINICAL DATA:  Right upper quadrant pain  EXAM: ULTRASOUND ABDOMEN COMPLETE  COMPARISON:  None.  FINDINGS: Gallbladder:  There is mild gallbladder wall thickening to 6 mm. No pericholecystic fluid. There is dependent sludge within the gallbladder. Negative sonographic Murphy's sign.  Common bile duct:  Diameter: Normal diameter 4 mm.  Liver:  No focal lesion identified. Within normal limits in parenchymal echogenicity.  IVC:  No abnormality visualized.  Pancreas:  Visualized portion unremarkable.  Spleen:  Size and appearance within normal limits.  Right Kidney: 12.4cm in length. No evidence of hydronephrosis or stones. Anechoic 1.5 cm cyst in the upper pole. This is proteinaceous nonenhancing cyst on comparison CT.  Left Kidney:  Length: 12.7. Echogenicity within normal limits. No mass or hydronephrosis visualized.  Abdominal aorta:  No aneurysm visualized.  IMPRESSION:  Gallbladder sludge and gallbladder wall thickening without evidence of acute cholecystitis. The findings could represent chronic cholecystitis.   Electronically Signed   By: Suzy Bouchard M.D.   On: 09/01/2013 12:06   Ct Abdomen Pelvis W Contrast  09/01/2013   CLINICAL DATA:  Mid abdominal discomfort and generalized weakness, history of renal cell malignancy and radiofrequency ablation.  EXAM: CT ABDOMEN AND PELVIS WITH CONTRAST  TECHNIQUE: Multidetector CT imaging of the abdomen and pelvis was performed using the standard protocol following bolus administration of intravenous contrast.  CONTRAST:  157mL OMNIPAQUE IOHEXOL 300 MG/ML  SOLN  COMPARISON:  CT scan of the abdomen dated June 16, 2013.  FINDINGS: The gallbladder is abnormal in appearance. It is mildly distended and exhibits wall thickening and pericholecystic fluid. No calcified or noncalcified stones are demonstrated. The adjacent liver is normal in density and contour. There may be a trace of intrahepatic ductal dilation. The pancreas is somewhat  atrophic and exhibits no evidence of inflammatory change or focal mass or ductal dilation. The stomach is moderately distended with food particles. The spleen is not enlarged. There are no adrenal masses  The kidneys exhibit no evidence of obstruction. There are findings consistent with the previous radiofrequency ablation on the right. There is a small hypodense focus medial to the lower pole of the right kidney which is stable. It is demonstrated on image 52 of series 2. It measures 1.6 by a approximately 1 cm. A 2nd cortically based midpole hypodensity on the right is stable and is most compatible with a cyst. There is a stable 1.5 cm diameter hypodensity in the lateral aspect of the mid to upper pole of the right kidney demonstrated best on image 41 of series 2. In the medial aspect of the mid to upper pole on image 42 of series 2 there is a 1 cm diameter stable appearing hypodensity. On the left there is no evidence of obstruction or parenchymal masses. A subcentimeter hypodensity in the midpole laterally is consistent with a cyst.  On delayed images contrast within the renal collecting systems is within the limits of normal.  The caliber of the abdominal aorta is normal. The periaortic and pericaval regions also appear normal. The partially contrast filled loops of small and large bowel exhibit no evidence of ileus nor of obstruction. A normal calibered, partially contrast-filled, uninflamed-appearing appendix is demonstrated. Within the pelvis the urinary bladder is mildly distended. The prostate gland is mildly enlarged and produces an impression upon the urinary bladder base. The seminal vesicles and prostate gland appear normal. There is a small fat containing left inguinal hernia. There is a tiny fat containing umbilical  hernia.  The right lung base demonstrates very mild subsegmental atelectasis posteriorly. The lumbar vertebral bodies are preserved in height. There is degenerative disc change at  multiple lumbar levels. There is partial compression of the body of T12 which appears to have been previously treated with kyphoplasty.  IMPRESSION: 1. The appearance of the gallbladder is abnormal suggesting acalculous cholecystitis. Ultrasound of the gallbladder would be useful. 2. The appearance of both kidneys is stable with no evidence of obstruction. Post ablation changes on the right are present and stable. There are stable hypodensities remaining in the renal cortex of the right kidney. 3. No acute bowel abnormality is demonstrated. 4. There is mild distention of the urinary bladder which may be related to the mildly enlarged prostate gland. Foley catheterization would be useful.   Electronically Signed   By: Zorawar Strollo  [place]   On: 09/01/2013 19:08   Nm Myocar Multi W/spect W/wall Motion / Ef  09/05/2013   CLINICAL DATA:  Cholecystitis and history of coronary artery disease with prior CABG. Mildly elevated troponin level.  EXAM: MYOCARDIAL IMAGING WITH SPECT (REST AND PHARMACOLOGIC-STRESS)  GATED LEFT VENTRICULAR WALL MOTION STUDY  LEFT VENTRICULAR EJECTION FRACTION  TECHNIQUE: Standard myocardial SPECT imaging was performed after resting intravenous injection of 10 mCi Tc-71m sestamibi. Subsequently, intravenous infusion of Lexiscan was performed under the supervision of the Cardiology staff. At peak effect of the drug, 30 mCi Tc-75m sestamibi was injected intravenously and standard myocardial SPECT imaging was performed. Quantitative gated imaging was also performed to evaluate left ventricular wall motion, and estimate left ventricular ejection fraction.  COMPARISON:  None.  FINDINGS: Utilizing gated data, the end-diastolic volume is estimated to be 125 mL and the end systolic volume 44 mL. Calculated ejection fraction is 65%.  Gated wall motion analysis demonstrates mild septal hypokinesis which is likely secondary to prior CABG. No other wall motion abnormalities are identified.  SPECT imaging shows  reversibility at the level of the mid to basal septum extending into the inferoseptal wall and consistent with inducible ischemia. No other fixed or reversible perfusion defects are identified.  IMPRESSION: 1. Reversibility demonstrated at the level of the mid to basal septum and adjacent inferoseptal wall. Findings are consistent with inducible ischemia. No other perfusion defects are identified. 2. Normal left ventricular function with quantitative ejection fraction calculation of 65%. Mild septal hypokinesis can be attributed to prior CABG.   Electronically Signed   By: Aletta Edouard M.D.   On: 09/05/2013 13:40         Subjective: Patient is feeling good. He denies any fevers, chills, chest discomfort, shortness breath, nausea, vomiting, diarrhea, abdominal pain. He has had bowel movement. Tolerating diet.  Objective: Filed Vitals:   09/04/13 1405 09/04/13 2058 09/05/13 0549 09/05/13 1300  BP: 139/79 142/59 145/81 157/69  Pulse: 78 68 64   Temp: 98.1 F (36.7 C) 98.1 F (36.7 C) 97.9 F (36.6 C)   TempSrc: Oral Oral Oral   Resp: 18 18 18 18   Height:      Weight:      SpO2: 99% 99% 98% 98%    Intake/Output Summary (Last 24 hours) at 09/05/13 1549 Last data filed at 09/05/13 0553  Gross per 24 hour  Intake 1663.85 ml  Output   2375 ml  Net -711.15 ml   Weight change:  Exam:   General:  Pt is alert, follows commands appropriately, not in acute distress  HEENT: No icterus, No thrush,  [place]/AT  Cardiovascular: RRR, S1/S2, no rubs,  no gallops  Respiratory: CTA bilaterally, no wheezing, no crackles, no rhonchi  Abdomen: Soft/+BS, non tender, non distended, no guarding  Extremities: 2+ LE edema, No lymphangitis, No petechiae, No rashes, no synovitis  Data Reviewed: Basic Metabolic Panel:  Recent Labs Lab 09/02/13 0523 09/02/13 1523 09/03/13 0511 09/04/13 0500 09/05/13 0530  NA 132* 130* 132* 135* 137  K 4.2 4.4 4.4 4.1 4.2  CL 96 94* 96 97 100  CO2 26 26 24  26 26   GLUCOSE 117* 118* 103* 117* 112*  BUN 18 15 12 13 9   CREATININE 0.90 0.85 0.81 0.91 0.90  CALCIUM 9.2 9.2 9.4 9.5 9.4   Liver Function Tests:  Recent Labs Lab 09/02/13 0523 09/02/13 1523 09/03/13 0511 09/04/13 0500 09/05/13 0530  AST 78* 93* 89* 62* 47*  ALT 52 67* 76* 77* 73*  ALKPHOS 101 102 112 114 109  BILITOT 0.8 0.8 0.7 0.5 0.4  PROT 6.0 6.1 6.0 6.1 6.0  ALBUMIN 2.9* 3.0* 2.9* 2.9* 2.9*    Recent Labs Lab 09/01/13 1055  LIPASE 17   No results found for this basename: AMMONIA,  in the last 168 hours CBC:  Recent Labs Lab 09/01/13 1055 09/02/13 0523 09/03/13 0511 09/04/13 0500 09/05/13 0530  WBC 11.7* 9.3 7.0 6.9 6.9  NEUTROABS 9.2*  --   --   --   --   HGB 12.2* 11.2* 11.3* 11.6* 11.7*  HCT 33.8* 32.9* 32.7* 32.7* 34.2*  MCV 82.2 83.3 83.6 82.2 83.6  PLT 152 149* 144* 182 187   Cardiac Enzymes:  Recent Labs Lab 09/01/13 1055 09/01/13 1440 09/01/13 1805 09/01/13 2027  TROPONINI 0.47* 0.46* 0.49* 0.44*   BNP: No components found with this basename: POCBNP,  CBG:  Recent Labs Lab 09/02/13 2157 09/03/13 0736  GLUCAP 134* 110*    No results found for this or any previous visit (from the past 240 hour(s)).   Scheduled Meds: . aspirin EC  81 mg Oral Daily  . atorvastatin  40 mg Oral Daily  . carvedilol  25 mg Oral BID WC  . darifenacin  15 mg Oral Daily  . digoxin  0.125 mg Oral Daily  . docusate sodium  100 mg Oral BID  . multivitamin with minerals  1 tablet Oral Daily  . pantoprazole (PROTONIX) IV  40 mg Intravenous Q12H  . piperacillin-tazobactam (ZOSYN)  IV  3.375 g Intravenous Q8H  . potassium chloride  10 mEq Oral Daily  . senna  1 tablet Oral BID  . sodium chloride  3 mL Intravenous Q12H  . tamsulosin  0.4 mg Oral Daily  . vitamin B-12  1,000 mcg Oral Daily   Continuous Infusions: . sodium chloride 50 mL/hr at 09/04/13 1737  . heparin 1,900 Units/hr (09/05/13 0530)     Ilithyia Titzer, DO  [facility] Pager  [phone]  If 7PM-7AM, please contact night-coverage [email or web address] Password [email or web address] 09/05/2013, 3:49 PM   LOS: 4 days

## 2013-09-05 NOTE — Progress Notes (Addendum)
Patient Name: [name] Date of Encounter: 09/05/2013     Active Problems:   HYPERTENSION   CAD   Atrial fibrillation   AORTIC VALVE REPLACEMENT, HX OF   Chronic diastolic heart failure   Abdominal  pain, other specified site   Constipation   Hypotension   Leukocytosis   Normocytic anemia   Hyponatremia   Acalculous cholecystitis    SUBJECTIVE  The patient was seen [facility] in [facility].  He is awaiting his Myoview stress test.  He states that he feels well now.  He denies any chest pain or shortness of breath.  CURRENT MEDS . aspirin EC  81 mg Oral Daily  . atorvastatin  40 mg Oral Daily  . carvedilol  25 mg Oral BID WC  . darifenacin  15 mg Oral Daily  . digoxin  0.125 mg Oral Daily  . docusate sodium  100 mg Oral BID  . multivitamin with minerals  1 tablet Oral Daily  . pantoprazole (PROTONIX) IV  40 mg Intravenous Q12H  . piperacillin-tazobactam (ZOSYN)  IV  3.375 g Intravenous Q8H  . potassium chloride  10 mEq Oral Daily  . senna  1 tablet Oral BID  . sodium chloride  3 mL Intravenous Q12H  . tamsulosin  0.4 mg Oral Daily  . vitamin B-12  1,000 mcg Oral Daily    OBJECTIVE  Filed Vitals:   09/04/13 0603 09/04/13 1405 09/04/13 2058 09/05/13 0549  BP: 126/50 139/79 142/59 145/81  Pulse: 72 78 68 64  Temp: 97.3 F (36.3 C) 98.1 F (36.7 C) 98.1 F (36.7 C) 97.9 F (36.6 C)  TempSrc: Oral Oral Oral Oral  Resp: 18 18 18 18   Height:      Weight:      SpO2: 98% 99% 99% 98%    Intake/Output Summary (Last 24 hours) at 09/05/13 1017 Last data filed at 09/05/13 0553  Gross per 24 hour  Intake 2448.95 ml  Output   2375 ml  Net  73.95 ml   Filed Weights   09/01/13 1700  Weight: 228 lb 6.3 oz (103.6 kg)    PHYSICAL EXAM  General: Pleasant, NAD. Neuro: Alert and oriented X 3. Moves all extremities spontaneously. Psych: Normal affect. HEENT:  Normal  Neck: Supple without bruits or JVD. Lungs:  Resp regular and unlabored,  CTA. Heart: Irregularly irregular rhythm.  Mechanical second heart sound.  A 2/6 systolic ejection murmur. Abdomen: Soft, non-tender, non-distended, BS + x 4.  Extremities: No clubbing, cyanosis or edema. DP/PT/Radials 2+ and equal bilaterally.  Accessory Clinical Findings  CBC  Recent Labs  09/04/13 0500 09/05/13 0530  WBC 6.9 6.9  HGB 11.6* 11.7*  HCT 32.7* 34.2*  MCV 82.2 83.6  PLT 182 [id number]   Basic Metabolic Panel  Recent Labs  09/04/13 0500 09/05/13 0530  NA 135* 137  K 4.1 4.2  CL 97 100  CO2 26 26  GLUCOSE 117* 112*  BUN 13 9  CREATININE 0.91 0.90  CALCIUM 9.5 9.4   Liver Function Tests  Recent Labs  09/04/13 0500 09/05/13 0530  AST 62* 47*  ALT 77* 73*  ALKPHOS 114 109  BILITOT 0.5 0.4  PROT 6.1 6.0  ALBUMIN 2.9* 2.9*   No results found for this basename: LIPASE, AMYLASE,  in the last 72 hours Cardiac Enzymes No results found for this basename: CKTOTAL, CKMB, CKMBINDEX, TROPONINI,  in the last 72 hours BNP No components found with this basename: POCBNP,  D-Dimer No results found for this basename: DDIMER,  in the last 72 hours Hemoglobin A1C No results found for this basename: HGBA1C,  in the last 72 hours Fasting Lipid Panel No results found for this basename: CHOL, HDL, LDLCALC, TRIG, CHOLHDL, LDLDIRECT,  in the last 72 hours Thyroid Function Tests No results found for this basename: TSH, T4TOTAL, FREET3, T3FREE, THYROIDAB,  in the last 72 hours  TELE  Atrial fibrillation  ECG  2-D echo: Study Conclusions  - Left ventricle: The cavity size was normal. Wall thickness was increased in a pattern of mild LVH. Systolic function was normal. The estimated ejection fraction was in the range of 55% to 60%. - Aortic valve: AV prosthesis opens well. Peak and mean gradients through the valve are 29 and 15 mm Hg respectively. Trivial regurgitation. - Mitral valve: Mild regurgitation. - Pulmonary arteries: PA peak pressure: 54mm Hg  (S).  Radiology/Studies  Dg Chest 2 View  09/01/2013   CLINICAL DATA:  Weakness. Atrial fibrillation. High blood pressure. Renal cell carcinoma. Prior smoker.  EXAM: CHEST  2 VIEW  COMPARISON:  06/16/2012.  FINDINGS: Post median sternotomy with fracture of upper sternal wires. Post CABG. Heart size top-normal. Calcified mildly tortuous aorta.  T12 compression fracture with prior cement augmentation unchanged. No new compression fracture detected.  No infiltrate, congestive heart failure or pneumothorax.  No plain film evidence of pulmonary malignancy/metastatic disease.  IMPRESSION: Post median sternotomy with fracture of upper sternal wires. Post CABG. Heart size top-normal.  Calcified mildly tortuous aorta.  T12 compression fracture with prior cement augmentation unchanged. No new compression fracture detected.  No infiltrate, congestive heart failure or pneumothorax.  No plain film evidence of pulmonary malignancy/metastatic disease.   Electronically Signed   By: Chauncey Cruel M.D.   On: 09/01/2013 12:22   Nm Hepatobiliary Liver Func  09/03/2013   CLINICAL DATA:  Right upper quadrant pain  EXAM: NUCLEAR MEDICINE HEPATOBILIARY IMAGING  TECHNIQUE: Sequential images of the abdomen were obtained out to 60 minutes following intravenous administration of radiopharmaceutical.  COMPARISON:  None.  RADIOPHARMACEUTICALS:  5.76mCi Tc-64m Choletec  FINDINGS: Following the intravenous administration of the radiopharmaceutical there is uniform tracer uptake with clearance from blood pool. Tracer activity within the common bile duct and small bowel loops noted by 15 min. After 60 min no activity is noted within the gallbladder. The patient was given 4 mg of morphine sulfate, IV and imaging was carried out for an additional 30 min. 15 min after administering morphine radiotracer began accumulating within the gallbladder.  IMPRESSION: 1. Patent cystic duct without evidence for acute cholecystitis. 2. Delayed gallbladder  filling compatible with chronic cholecystitis.   Electronically Signed   By: Kerby Moors M.D.   On: 09/03/2013 13:11   US Abdomen Complete  09/01/2013   CLINICAL DATA:  Right upper quadrant pain  EXAM: ULTRASOUND ABDOMEN COMPLETE  COMPARISON:  None.  FINDINGS: Gallbladder:  There is mild gallbladder wall thickening to 6 mm. No pericholecystic fluid. There is dependent sludge within the gallbladder. Negative sonographic Murphy's sign.  Common bile duct:  Diameter: Normal diameter 4 mm.  Liver:  No focal lesion identified. Within normal limits in parenchymal echogenicity.  IVC:  No abnormality visualized.  Pancreas:  Visualized portion unremarkable.  Spleen:  Size and appearance within normal limits.  Right Kidney: 12.4cm in length. No evidence of hydronephrosis or stones. Anechoic 1.5 cm cyst in the upper pole. This is proteinaceous nonenhancing cyst on comparison CT.  Left Kidney:  Length:  12.7. Echogenicity within normal limits. No mass or hydronephrosis visualized.  Abdominal aorta:  No aneurysm visualized.  IMPRESSION:  Gallbladder sludge and gallbladder wall thickening without evidence of acute cholecystitis. The findings could represent chronic cholecystitis.   Electronically Signed   By: Suzy Bouchard M.D.   On: 09/01/2013 12:06   Ct Abdomen Pelvis W Contrast  09/01/2013   CLINICAL DATA:  Mid abdominal discomfort and generalized weakness, history of renal cell malignancy and radiofrequency ablation.  EXAM: CT ABDOMEN AND PELVIS WITH CONTRAST  TECHNIQUE: Multidetector CT imaging of the abdomen and pelvis was performed using the standard protocol following bolus administration of intravenous contrast.  CONTRAST:  161mL OMNIPAQUE IOHEXOL 300 MG/ML  SOLN  COMPARISON:  CT scan of the abdomen dated June 16, 2013.  FINDINGS: The gallbladder is abnormal in appearance. It is mildly distended and exhibits wall thickening and pericholecystic fluid. No calcified or noncalcified stones are demonstrated. The  adjacent liver is normal in density and contour. There may be a trace of intrahepatic ductal dilation. The pancreas is somewhat atrophic and exhibits no evidence of inflammatory change or focal mass or ductal dilation. The stomach is moderately distended with food particles. The spleen is not enlarged. There are no adrenal masses  The kidneys exhibit no evidence of obstruction. There are findings consistent with the previous radiofrequency ablation on the right. There is a small hypodense focus medial to the lower pole of the right kidney which is stable. It is demonstrated on image 52 of series 2. It measures 1.6 by a approximately 1 cm. A 2nd cortically based midpole hypodensity on the right is stable and is most compatible with a cyst. There is a stable 1.5 cm diameter hypodensity in the lateral aspect of the mid to upper pole of the right kidney demonstrated best on image 41 of series 2. In the medial aspect of the mid to upper pole on image 42 of series 2 there is a 1 cm diameter stable appearing hypodensity. On the left there is no evidence of obstruction or parenchymal masses. A subcentimeter hypodensity in the midpole laterally is consistent with a cyst.  On delayed images contrast within the renal collecting systems is within the limits of normal.  The caliber of the abdominal aorta is normal. The periaortic and pericaval regions also appear normal. The partially contrast filled loops of small and large bowel exhibit no evidence of ileus nor of obstruction. A normal calibered, partially contrast-filled, uninflamed-appearing appendix is demonstrated. Within the pelvis the urinary bladder is mildly distended. The prostate gland is mildly enlarged and produces an impression upon the urinary bladder base. The seminal vesicles and prostate gland appear normal. There is a small fat containing left inguinal hernia. There is a tiny fat containing umbilical hernia.  The right lung base demonstrates very mild  subsegmental atelectasis posteriorly. The lumbar vertebral bodies are preserved in height. There is degenerative disc change at multiple lumbar levels. There is partial compression of the body of T12 which appears to have been previously treated with kyphoplasty.  IMPRESSION: 1. The appearance of the gallbladder is abnormal suggesting acalculous cholecystitis. Ultrasound of the gallbladder would be useful. 2. The appearance of both kidneys is stable with no evidence of obstruction. Post ablation changes on the right are present and stable. There are stable hypodensities remaining in the renal cortex of the right kidney. 3. No acute bowel abnormality is demonstrated. 4. There is mild distention of the urinary bladder which may be related to the  mildly enlarged prostate gland. Foley catheterization would be useful.   Electronically Signed   By: David  Martinique   On: 09/01/2013 19:08    ASSESSMENT AND PLAN 1.  Acalculous cholecystitis 2. mechanical aortic valve replacement functioning normally 3. permanent atrial fibrillation controlled on carvedilol.  Coumadin on hold pending possible surgery for gallbladder 4. coronary artery disease with history of coronary artery bypass graft surgery in October 2013 at the time of his aortic valve replacement.  Mildly elevated troponin she may represent demand ischemia.  Lexus scan Myoview results pending.  On aspirin, beta blocker, and statin.  Continue current medication.  Await results of Myoview stress test.  Signed, Darlin Coco MD  Addendum: Myoview stress test shows reversible inferoseptal ischemia:  IMPRESSION:  1. Reversibility demonstrated at the level of the mid to basal  septum and adjacent inferoseptal wall. Findings are consistent with  inducible ischemia. No other perfusion defects are identified.  2. Normal left ventricular function with quantitative ejection  fraction calculation of 65%. Mild septal hypokinesis can be  attributed to prior  CABG.  We will need to hold off on surgery and schedule left heart cardiac catheterization for Monday.  Continue IV heparin over weekend.

## 2013-09-06 DIAGNOSIS — I214 Non-ST elevation (NSTEMI) myocardial infarction: Secondary | ICD-10-CM

## 2013-09-06 LAB — COMPREHENSIVE METABOLIC PANEL
ALT: 64 U/L — ABNORMAL HIGH (ref 0–53)
AST: 33 U/L (ref 0–37)
Albumin: 3 g/dL — ABNORMAL LOW (ref 3.5–5.2)
Alkaline Phosphatase: 102 U/L (ref 39–117)
BILIRUBIN TOTAL: 0.4 mg/dL (ref 0.3–1.2)
BUN: 9 mg/dL (ref 6–23)
CALCIUM: 9.3 mg/dL (ref 8.4–10.5)
CHLORIDE: 99 meq/L (ref 96–112)
CO2: 25 meq/L (ref 19–32)
Creatinine, Ser: 1.03 mg/dL (ref 0.50–1.35)
GFR, EST AFRICAN AMERICAN: 79 mL/min — AB (ref >90)
GFR, EST NON AFRICAN AMERICAN: 68 mL/min — AB (ref >90)
GLUCOSE: 122 mg/dL — AB (ref 70–99)
Potassium: 4.1 mEq/L (ref 3.7–5.3)
Sodium: 135 mEq/L — ABNORMAL LOW (ref 137–147)
Total Protein: 6.1 g/dL (ref 6.0–8.3)

## 2013-09-06 LAB — CBC
HCT: 32.7 % — ABNORMAL LOW (ref 39.0–52.0)
Hemoglobin: 11.3 g/dL — ABNORMAL LOW (ref 13.0–17.0)
MCH: 28.5 pg (ref 26.0–34.0)
MCHC: 34.6 g/dL (ref 30.0–36.0)
MCV: 82.6 fL (ref 78.0–100.0)
PLATELETS: 241 10*3/uL (ref 150–400)
RBC: 3.96 MIL/uL — ABNORMAL LOW (ref 4.22–5.81)
RDW: 12.5 % (ref 11.5–15.5)
WBC: 6.8 10*3/uL (ref 4.0–10.5)

## 2013-09-06 LAB — PROTIME-INR
INR: 1.31 (ref 0.00–1.49)
PROTHROMBIN TIME: 16 s — AB (ref 11.6–15.2)

## 2013-09-06 LAB — HEPARIN LEVEL (UNFRACTIONATED): Heparin Unfractionated: 0.57 IU/mL (ref 0.30–0.70)

## 2013-09-06 MED ORDER — SODIUM CHLORIDE 0.9 % IJ SOLN: 3.0000 mL | Freq: Two times a day (BID) | INTRAMUSCULAR | Status: DC

## 2013-09-06 MED ORDER — SODIUM CHLORIDE 0.9 % IV SOLN: 250.0000 mL | INTRAVENOUS | Status: DC | PRN

## 2013-09-06 MED ORDER — DIAZEPAM 5 MG PO TABS: 5.0000 mg | ORAL_TABLET | ORAL | Status: DC

## 2013-09-06 MED ORDER — SODIUM CHLORIDE 0.9 % IJ SOLN: 3.0000 mL | INTRAMUSCULAR | Status: DC | PRN

## 2013-09-06 MED ORDER — ASPIRIN 81 MG PO CHEW
81.0000 mg | CHEWABLE_TABLET | ORAL | Status: DC
  Filled 2013-09-06: qty 1

## 2013-09-06 NOTE — Progress Notes (Signed)
[facility] PROGRESS NOTE  LEVORN ELSMORE [id number] DOB: [birth year] DOA: 09/01/2013 PCP: Gennette Pac, MD  Assessment/Plan: Acalculus cholecystitis  -AST/ALT remain mildly elevated  -Lipase and Lactic acid neg. UA neg.  -patient also has history of RCC s/p two radiofrequency ablations.  - CT abd/pelvis--gallbladder wall thickening with pericholecystic fluid and trace biliary ductal dilatation  - General surgery consultation given suspicion of chronic cholecystitis  -HIDA--patent cystic duct, chronic cholecystitis  - continue zosyn-->clinically improved with abx  -diet was advance and pt tolerated today  -appreciate surgery input  -no surgery until cardiac status is clarified/resolved CAD with mild elevation of troponin/abnormal nuclear stress test  -Heart catheterization 09/07/2013 -myoview--reveals inducible ischemia, EF 65%  -Heart catheterization planned 09/07/2013  -s/p CABG/AVR,  - ASA, statin, BB  -Appreciate Cardiology following  -restart demadex if okay with cardiology  Permanent atrial fibrillation  -on A/C and rate controlled on BB and dig  - Heparin gtt with INR < 2 for anticipation of surgery  -Echo EF 55-60%, well function AV prosthesis  Progressive generalized weakness  -overall improving during hospitalization  -No focal abnormalities  - Multifactorial including deconditioning as well as his infectious process and other electrolyte arrangements  - IVF  - PT-recommended home health PT  - TSH--0.875  - Cortisol 14.1  Mechanical AVR  -IV heparin when INR <2 if surgery is anticipated  GERD,  -possible acid-reflux like symptoms  -Continue PPI  Leukocytosis  -Due to acute medical condition  -Improved  -Afebrile and hemodynamically stable  Family Communication: wife and daughter at beside  Disposition Plan: Home when medically stable  Antibiotics:  Zosyn 09/02/13>>>       Procedures/Studies: Dg Chest 2 View  09/01/2013   CLINICAL DATA:   Weakness. Atrial fibrillation. High blood pressure. Renal cell carcinoma. Prior smoker.  EXAM: CHEST  2 VIEW  COMPARISON:  06/16/2012.  FINDINGS: Post median sternotomy with fracture of upper sternal wires. Post CABG. Heart size top-normal. Calcified mildly tortuous aorta.  T12 compression fracture with prior cement augmentation unchanged. No new compression fracture detected.  No infiltrate, congestive heart failure or pneumothorax.  No plain film evidence of pulmonary malignancy/metastatic disease.  IMPRESSION: Post median sternotomy with fracture of upper sternal wires. Post CABG. Heart size top-normal.  Calcified mildly tortuous aorta.  T12 compression fracture with prior cement augmentation unchanged. No new compression fracture detected.  No infiltrate, congestive heart failure or pneumothorax.  No plain film evidence of pulmonary malignancy/metastatic disease.   Electronically Signed   By: Chauncey Cruel M.D.   On: 09/01/2013 12:22   Nm Hepatobiliary Liver Func  09/03/2013   CLINICAL DATA:  Right upper quadrant pain  EXAM: NUCLEAR MEDICINE HEPATOBILIARY IMAGING  TECHNIQUE: Sequential images of the abdomen were obtained out to 60 minutes following intravenous administration of radiopharmaceutical.  COMPARISON:  None.  RADIOPHARMACEUTICALS:  5.43mCi Tc-35m Choletec  FINDINGS: Following the intravenous administration of the radiopharmaceutical there is uniform tracer uptake with clearance from blood pool. Tracer activity within the common bile duct and small bowel loops noted by 15 min. After 60 min no activity is noted within the gallbladder. The patient was given 4 mg of morphine sulfate, IV and imaging was carried out for an additional 30 min. 15 min after administering morphine radiotracer began accumulating within the gallbladder.  IMPRESSION: 1. Patent cystic duct without evidence for acute cholecystitis. 2. Delayed gallbladder filling compatible with chronic cholecystitis.   Electronically Signed   By:  Kerby Moors M.D.   On: 09/03/2013  13:11   US Abdomen Complete  09/01/2013   CLINICAL DATA:  Right upper quadrant pain  EXAM: ULTRASOUND ABDOMEN COMPLETE  COMPARISON:  None.  FINDINGS: Gallbladder:  There is mild gallbladder wall thickening to 6 mm. No pericholecystic fluid. There is dependent sludge within the gallbladder. Negative sonographic Murphy's sign.  Common bile duct:  Diameter: Normal diameter 4 mm.  Liver:  No focal lesion identified. Within normal limits in parenchymal echogenicity.  IVC:  No abnormality visualized.  Pancreas:  Visualized portion unremarkable.  Spleen:  Size and appearance within normal limits.  Right Kidney: 12.4cm in length. No evidence of hydronephrosis or stones. Anechoic 1.5 cm cyst in the upper pole. This is proteinaceous nonenhancing cyst on comparison CT.  Left Kidney:  Length: 12.7. Echogenicity within normal limits. No mass or hydronephrosis visualized.  Abdominal aorta:  No aneurysm visualized.  IMPRESSION:  Gallbladder sludge and gallbladder wall thickening without evidence of acute cholecystitis. The findings could represent chronic cholecystitis.   Electronically Signed   By: Suzy Bouchard M.D.   On: 09/01/2013 12:06   Ct Abdomen Pelvis W Contrast  09/01/2013   CLINICAL DATA:  Mid abdominal discomfort and generalized weakness, history of renal cell malignancy and radiofrequency ablation.  EXAM: CT ABDOMEN AND PELVIS WITH CONTRAST  TECHNIQUE: Multidetector CT imaging of the abdomen and pelvis was performed using the standard protocol following bolus administration of intravenous contrast.  CONTRAST:  124mL OMNIPAQUE IOHEXOL 300 MG/ML  SOLN  COMPARISON:  CT scan of the abdomen dated June 16, 2013.  FINDINGS: The gallbladder is abnormal in appearance. It is mildly distended and exhibits wall thickening and pericholecystic fluid. No calcified or noncalcified stones are demonstrated. The adjacent liver is normal in density and contour. There may be a trace of  intrahepatic ductal dilation. The pancreas is somewhat atrophic and exhibits no evidence of inflammatory change or focal mass or ductal dilation. The stomach is moderately distended with food particles. The spleen is not enlarged. There are no adrenal masses  The kidneys exhibit no evidence of obstruction. There are findings consistent with the previous radiofrequency ablation on the right. There is a small hypodense focus medial to the lower pole of the right kidney which is stable. It is demonstrated on image 52 of series 2. It measures 1.6 by a approximately 1 cm. A 2nd cortically based midpole hypodensity on the right is stable and is most compatible with a cyst. There is a stable 1.5 cm diameter hypodensity in the lateral aspect of the mid to upper pole of the right kidney demonstrated best on image 41 of series 2. In the medial aspect of the mid to upper pole on image 42 of series 2 there is a 1 cm diameter stable appearing hypodensity. On the left there is no evidence of obstruction or parenchymal masses. A subcentimeter hypodensity in the midpole laterally is consistent with a cyst.  On delayed images contrast within the renal collecting systems is within the limits of normal.  The caliber of the abdominal aorta is normal. The periaortic and pericaval regions also appear normal. The partially contrast filled loops of small and large bowel exhibit no evidence of ileus nor of obstruction. A normal calibered, partially contrast-filled, uninflamed-appearing appendix is demonstrated. Within the pelvis the urinary bladder is mildly distended. The prostate gland is mildly enlarged and produces an impression upon the urinary bladder base. The seminal vesicles and prostate gland appear normal. There is a small fat containing left inguinal hernia. There is a  tiny fat containing umbilical hernia.  The right lung base demonstrates very mild subsegmental atelectasis posteriorly. The lumbar vertebral bodies are preserved  in height. There is degenerative disc change at multiple lumbar levels. There is partial compression of the body of T12 which appears to have been previously treated with kyphoplasty.  IMPRESSION: 1. The appearance of the gallbladder is abnormal suggesting acalculous cholecystitis. Ultrasound of the gallbladder would be useful. 2. The appearance of both kidneys is stable with no evidence of obstruction. Post ablation changes on the right are present and stable. There are stable hypodensities remaining in the renal cortex of the right kidney. 3. No acute bowel abnormality is demonstrated. 4. There is mild distention of the urinary bladder which may be related to the mildly enlarged prostate gland. Foley catheterization would be useful.   Electronically Signed   By: Stanley Lyness  [place]   On: 09/01/2013 19:08   Nm Myocar Multi W/spect W/wall Motion / Ef  09/05/2013   CLINICAL DATA:  Cholecystitis and history of coronary artery disease with prior CABG. Mildly elevated troponin level.  EXAM: MYOCARDIAL IMAGING WITH SPECT (REST AND PHARMACOLOGIC-STRESS)  GATED LEFT VENTRICULAR WALL MOTION STUDY  LEFT VENTRICULAR EJECTION FRACTION  TECHNIQUE: Standard myocardial SPECT imaging was performed after resting intravenous injection of 10 mCi Tc-19m sestamibi. Subsequently, intravenous infusion of Lexiscan was performed under the supervision of the Cardiology staff. At peak effect of the drug, 30 mCi Tc-74m sestamibi was injected intravenously and standard myocardial SPECT imaging was performed. Quantitative gated imaging was also performed to evaluate left ventricular wall motion, and estimate left ventricular ejection fraction.  COMPARISON:  None.  FINDINGS: Utilizing gated data, the end-diastolic volume is estimated to be 125 mL and the end systolic volume 44 mL. Calculated ejection fraction is 65%.  Gated wall motion analysis demonstrates mild septal hypokinesis which is likely secondary to prior CABG. No other wall motion  abnormalities are identified.  SPECT imaging shows reversibility at the level of the mid to basal septum extending into the inferoseptal wall and consistent with inducible ischemia. No other fixed or reversible perfusion defects are identified.  IMPRESSION: 1. Reversibility demonstrated at the level of the mid to basal septum and adjacent inferoseptal wall. Findings are consistent with inducible ischemia. No other perfusion defects are identified. 2. Normal left ventricular function with quantitative ejection fraction calculation of 65%. Mild septal hypokinesis can be attributed to prior CABG.   Electronically Signed   By: Aletta Edouard M.D.   On: 09/05/2013 13:40         Subjective: Patient feels great. He is eating well. No abdominal pain. Denies any fevers, chills, chest pain, shortness of breath, nausea, vomiting, diarrhea, dysuria.  Objective: Filed Vitals:   09/05/13 1300 09/05/13 2230 09/06/13 0604 09/06/13 1429  BP: 157/69 138/74 153/74 156/62  Pulse:  59 106 78  Temp:  97.8 F (36.6 C) 97.5 F (36.4 C) 97.9 F (36.6 C)  TempSrc:  Oral Oral Oral  Resp: 18 18 18 16   Height:      Weight:      SpO2: 98% 100% 97% 97%    Intake/Output Summary (Last 24 hours) at 09/06/13 1453 Last data filed at 09/05/13 1852  Gross per 24 hour  Intake 1235.85 ml  Output      0 ml  Net 1235.85 ml   Weight change:  Exam:   General:  Pt is alert, follows commands appropriately, not in acute distress  HEENT: No icterus, No thrush, [place]/AT  Cardiovascular:  RRR, S1/S2, no rubs, no gallops  Respiratory: CTA bilaterally, no wheezing, no crackles, no rhonchi  Abdomen: Soft/+BS, non tender, non distended, no guarding  Extremities: 1+LE edema, No lymphangitis, No petechiae, No rashes, no synovitis  Data Reviewed: Basic Metabolic Panel:  Recent Labs Lab 09/02/13 1523 09/03/13 0511 09/04/13 0500 09/05/13 0530 09/06/13 0522  NA 130* 132* 135* 137 135*  K 4.4 4.4 4.1 4.2 4.1  CL 94*  96 97 100 99  CO2 26 24 26 26 25   GLUCOSE 118* 103* 117* 112* 122*  BUN 15 12 13 9 9   CREATININE 0.85 0.81 0.91 0.90 1.03  CALCIUM 9.2 9.4 9.5 9.4 9.3   Liver Function Tests:  Recent Labs Lab 09/02/13 1523 09/03/13 0511 09/04/13 0500 09/05/13 0530 09/06/13 0522  AST 93* 89* 62* 47* 33  ALT 67* 76* 77* 73* 64*  ALKPHOS 102 112 114 109 102  BILITOT 0.8 0.7 0.5 0.4 0.4  PROT 6.1 6.0 6.1 6.0 6.1  ALBUMIN 3.0* 2.9* 2.9* 2.9* 3.0*    Recent Labs Lab 09/01/13 1055  LIPASE 17   No results found for this basename: AMMONIA,  in the last 168 hours CBC:  Recent Labs Lab 09/01/13 1055 09/02/13 0523 09/03/13 0511 09/04/13 0500 09/05/13 0530 09/06/13 0522  WBC 11.7* 9.3 7.0 6.9 6.9 6.8  NEUTROABS 9.2*  --   --   --   --   --   HGB 12.2* 11.2* 11.3* 11.6* 11.7* 11.3*  HCT 33.8* 32.9* 32.7* 32.7* 34.2* 32.7*  MCV 82.2 83.3 83.6 82.2 83.6 82.6  PLT 152 149* 144* 182 187 241   Cardiac Enzymes:  Recent Labs Lab 09/01/13 1055 09/01/13 1440 09/01/13 1805 09/01/13 2027  TROPONINI 0.47* 0.46* 0.49* 0.44*   BNP: No components found with this basename: POCBNP,  CBG:  Recent Labs Lab 09/02/13 2157 09/03/13 0736  GLUCAP 134* 110*    No results found for this or any previous visit (from the past 240 hour(s)).   Scheduled Meds: . aspirin EC  81 mg Oral Daily  . atorvastatin  40 mg Oral Daily  . carvedilol  25 mg Oral BID WC  . darifenacin  15 mg Oral Daily  . digoxin  0.125 mg Oral Daily  . docusate sodium  100 mg Oral BID  . multivitamin with minerals  1 tablet Oral Daily  . pantoprazole (PROTONIX) IV  40 mg Intravenous Q12H  . piperacillin-tazobactam (ZOSYN)  IV  3.375 g Intravenous Q8H  . potassium chloride  10 mEq Oral Daily  . senna  1 tablet Oral BID  . sodium chloride  3 mL Intravenous Q12H  . tamsulosin  0.4 mg Oral Daily  . vitamin B-12  1,000 mcg Oral Daily   Continuous Infusions: . sodium chloride 50 mL/hr at 09/04/13 1737  . heparin 1,900 Units/hr  (09/05/13 1840)     Tally Mckinnon, DO  [facility] Pager [phone]  If 7PM-7AM, please contact night-coverage [email or web address] Password [email or web address] 09/06/2013, 2:53 PM   LOS: 5 days

## 2013-09-06 NOTE — Progress Notes (Signed)
Called by RN to notify me that patient's HR in low 50s most of afternoon with occasional dips into 40s.  Patient is asymptomatic and sitting in bed watching television.  BP stable.  RN tried to call cardiology without response.  Review of pt's vitals shows pt has occasional HR upper 50s and low 60s during hospitalization.  Pt remains in Afib with slow ventricular response.  I instructed RN to hold evening dose of carvedilol.  I contacted Dr. Wynonia Lawman to update him and he agreed.  Check digoxin level in am with BMP.  DTat

## 2013-09-06 NOTE — Progress Notes (Signed)
Patient Name: [name] Date of Encounter: 09/06/2013     Active Problems:   HYPERTENSION   CAD   Atrial fibrillation   AORTIC VALVE REPLACEMENT, HX OF   Chronic diastolic heart failure   Abdominal  pain, other specified site   Constipation   Hypotension   Leukocytosis   Normocytic anemia   Hyponatremia   Acalculous cholecystitis    SUBJECTIVE Lexa scan Myoview yesterday showed reversible inferoseptal ischemia.  The patient is scheduled for left heart cardiac [facility] tomorrow [facility]. The patient is not having any chest pain or shortness of breath.  He remains on IV heparin for his mechanical aortic valve and for his chronic atrial fibrillation.  Fortunately he is not having any active abdominal pain from his cholecystitis at this point.  CURRENT MEDS . aspirin EC  81 mg Oral Daily  . atorvastatin  40 mg Oral Daily  . carvedilol  25 mg Oral BID WC  . darifenacin  15 mg Oral Daily  . digoxin  0.125 mg Oral Daily  . docusate sodium  100 mg Oral BID  . multivitamin with minerals  1 tablet Oral Daily  . pantoprazole (PROTONIX) IV  40 mg Intravenous Q12H  . piperacillin-tazobactam (ZOSYN)  IV  3.375 g Intravenous Q8H  . potassium chloride  10 mEq Oral Daily  . senna  1 tablet Oral BID  . sodium chloride  3 mL Intravenous Q12H  . tamsulosin  0.4 mg Oral Daily  . vitamin B-12  1,000 mcg Oral Daily    OBJECTIVE  Filed Vitals:   09/05/13 0549 09/05/13 1300 09/05/13 2230 09/06/13 0604  BP: 145/81 157/69 138/74 153/74  Pulse: 64  59 106  Temp: 97.9 F (36.6 C)  97.8 F (36.6 C) 97.5 F (36.4 C)  TempSrc: Oral  Oral Oral  Resp: 18 18 18 18   Height:      Weight:      SpO2: 98% 98% 100% 97%    Intake/Output Summary (Last 24 hours) at 09/06/13 0836 Last data filed at 09/05/13 1852  Gross per 24 hour  Intake 1555.85 ml  Output      0 ml  Net 1555.85 ml   Filed Weights   09/01/13 1700  Weight: 228 lb 6.3 oz (103.6 kg)    PHYSICAL EXAM  General:  Pleasant, NAD. Neuro: Alert and oriented X 3. Moves all extremities spontaneously. Psych: Normal affect. HEENT:  Normal  Neck: Supple without bruits or JVD. Lungs:  Resp regular and unlabored, CTA. Heart: Irregular pulse.  Good aortic valve mechanical closure click. Abdomen: Soft, non-tender, non-distended, BS + x 4.  Extremities: No clubbing, cyanosis or edema. DP/PT/Radials 2+ and equal bilaterally.  Accessory Clinical Findings  CBC  Recent Labs  09/05/13 0530 09/06/13 0522  WBC 6.9 6.8  HGB 11.7* 11.3*  HCT 34.2* 32.7*  MCV 83.6 82.6  PLT 187 324   Basic Metabolic Panel  Recent Labs  09/05/13 0530 09/06/13 0522  NA 137 135*  K 4.2 4.1  CL 100 99  CO2 26 25  GLUCOSE 112* 122*  BUN 9 9  CREATININE 0.90 1.03  CALCIUM 9.4 9.3   Liver Function Tests  Recent Labs  09/05/13 0530 09/06/13 0522  AST 47* 33  ALT 73* 64*  ALKPHOS 109 102  BILITOT 0.4 0.4  PROT 6.0 6.1  ALBUMIN 2.9* 3.0*   No results found for this basename: LIPASE, AMYLASE,  in the last 72 hours Cardiac Enzymes No results found  for this basename: CKTOTAL, CKMB, CKMBINDEX, TROPONINI,  in the last 72 hours BNP No components found with this basename: POCBNP,  D-Dimer No results found for this basename: DDIMER,  in the last 72 hours Hemoglobin A1C No results found for this basename: HGBA1C,  in the last 72 hours Fasting Lipid Panel No results found for this basename: CHOL, HDL, LDLCALC, TRIG, CHOLHDL, LDLDIRECT,  in the last 72 hours Thyroid Function Tests No results found for this basename: TSH, T4TOTAL, FREET3, T3FREE, THYROIDAB,  in the last 72 hours  TELE  Atrial fibrillation with controlled ventricular response  ECG 09/02/13  Atrial fibrillation RSR' or QR pattern in V1 suggests right ventricular conduction delay U waves present Abnormal ECG   Radiology/Studies  Dg Chest 2 View  09/01/2013   CLINICAL DATA:  Weakness. Atrial fibrillation. High blood pressure. Renal cell  carcinoma. Prior smoker.  EXAM: CHEST  2 VIEW  COMPARISON:  06/16/2012.  FINDINGS: Post median sternotomy with fracture of upper sternal wires. Post CABG. Heart size top-normal. Calcified mildly tortuous aorta.  T12 compression fracture with prior cement augmentation unchanged. No new compression fracture detected.  No infiltrate, congestive heart failure or pneumothorax.  No plain film evidence of pulmonary malignancy/metastatic disease.  IMPRESSION: Post median sternotomy with fracture of upper sternal wires. Post CABG. Heart size top-normal.  Calcified mildly tortuous aorta.  T12 compression fracture with prior cement augmentation unchanged. No new compression fracture detected.  No infiltrate, congestive heart failure or pneumothorax.  No plain film evidence of pulmonary malignancy/metastatic disease.   Electronically Signed   By: Chauncey Cruel M.D.   On: 09/01/2013 12:22   Nm Hepatobiliary Liver Func  09/03/2013   CLINICAL DATA:  Right upper quadrant pain  EXAM: NUCLEAR MEDICINE HEPATOBILIARY IMAGING  TECHNIQUE: Sequential images of the abdomen were obtained out to 60 minutes following intravenous administration of radiopharmaceutical.  COMPARISON:  None.  RADIOPHARMACEUTICALS:  5.11mCi Tc-33m Choletec  FINDINGS: Following the intravenous administration of the radiopharmaceutical there is uniform tracer uptake with clearance from blood pool. Tracer activity within the common bile duct and small bowel loops noted by 15 min. After 60 min no activity is noted within the gallbladder. The patient was given 4 mg of morphine sulfate, IV and imaging was carried out for an additional 30 min. 15 min after administering morphine radiotracer began accumulating within the gallbladder.  IMPRESSION: 1. Patent cystic duct without evidence for acute cholecystitis. 2. Delayed gallbladder filling compatible with chronic cholecystitis.   Electronically Signed   By: Kerby Moors M.D.   On: 09/03/2013 13:11   US Abdomen  Complete  09/01/2013   CLINICAL DATA:  Right upper quadrant pain  EXAM: ULTRASOUND ABDOMEN COMPLETE  COMPARISON:  None.  FINDINGS: Gallbladder:  There is mild gallbladder wall thickening to 6 mm. No pericholecystic fluid. There is dependent sludge within the gallbladder. Negative sonographic Murphy's sign.  Common bile duct:  Diameter: Normal diameter 4 mm.  Liver:  No focal lesion identified. Within normal limits in parenchymal echogenicity.  IVC:  No abnormality visualized.  Pancreas:  Visualized portion unremarkable.  Spleen:  Size and appearance within normal limits.  Right Kidney: 12.4cm in length. No evidence of hydronephrosis or stones. Anechoic 1.5 cm cyst in the upper pole. This is proteinaceous nonenhancing cyst on comparison CT.  Left Kidney:  Length: 12.7. Echogenicity within normal limits. No mass or hydronephrosis visualized.  Abdominal aorta:  No aneurysm visualized.  IMPRESSION:  Gallbladder sludge and gallbladder wall thickening without evidence of acute  cholecystitis. The findings could represent chronic cholecystitis.   Electronically Signed   By: Suzy Bouchard M.D.   On: 09/01/2013 12:06   Ct Abdomen Pelvis W Contrast  09/01/2013   CLINICAL DATA:  Mid abdominal discomfort and generalized weakness, history of renal cell malignancy and radiofrequency ablation.  EXAM: CT ABDOMEN AND PELVIS WITH CONTRAST  TECHNIQUE: Multidetector CT imaging of the abdomen and pelvis was performed using the standard protocol following bolus administration of intravenous contrast.  CONTRAST:  122mL OMNIPAQUE IOHEXOL 300 MG/ML  SOLN  COMPARISON:  CT scan of the abdomen dated June 16, 2013.  FINDINGS: The gallbladder is abnormal in appearance. It is mildly distended and exhibits wall thickening and pericholecystic fluid. No calcified or noncalcified stones are demonstrated. The adjacent liver is normal in density and contour. There may be a trace of intrahepatic ductal dilation. The pancreas is somewhat atrophic  and exhibits no evidence of inflammatory change or focal mass or ductal dilation. The stomach is moderately distended with food particles. The spleen is not enlarged. There are no adrenal masses  The kidneys exhibit no evidence of obstruction. There are findings consistent with the previous radiofrequency ablation on the right. There is a small hypodense focus medial to the lower pole of the right kidney which is stable. It is demonstrated on image 52 of series 2. It measures 1.6 by a approximately 1 cm. A 2nd cortically based midpole hypodensity on the right is stable and is most compatible with a cyst. There is a stable 1.5 cm diameter hypodensity in the lateral aspect of the mid to upper pole of the right kidney demonstrated best on image 41 of series 2. In the medial aspect of the mid to upper pole on image 42 of series 2 there is a 1 cm diameter stable appearing hypodensity. On the left there is no evidence of obstruction or parenchymal masses. A subcentimeter hypodensity in the midpole laterally is consistent with a cyst.  On delayed images contrast within the renal collecting systems is within the limits of normal.  The caliber of the abdominal aorta is normal. The periaortic and pericaval regions also appear normal. The partially contrast filled loops of small and large bowel exhibit no evidence of ileus nor of obstruction. A normal calibered, partially contrast-filled, uninflamed-appearing appendix is demonstrated. Within the pelvis the urinary bladder is mildly distended. The prostate gland is mildly enlarged and produces an impression upon the urinary bladder base. The seminal vesicles and prostate gland appear normal. There is a small fat containing left inguinal hernia. There is a tiny fat containing umbilical hernia.  The right lung base demonstrates very mild subsegmental atelectasis posteriorly. The lumbar vertebral bodies are preserved in height. There is degenerative disc change at multiple lumbar  levels. There is partial compression of the body of T12 which appears to have been previously treated with kyphoplasty.  IMPRESSION: 1. The appearance of the gallbladder is abnormal suggesting acalculous cholecystitis. Ultrasound of the gallbladder would be useful. 2. The appearance of both kidneys is stable with no evidence of obstruction. Post ablation changes on the right are present and stable. There are stable hypodensities remaining in the renal cortex of the right kidney. 3. No acute bowel abnormality is demonstrated. 4. There is mild distention of the urinary bladder which may be related to the mildly enlarged prostate gland. Foley catheterization would be useful.   Electronically Signed   By: David  Martinique   On: 09/01/2013 19:08   Nm Myocar Multi W/spect  W/wall Motion / Ef  09/05/2013   CLINICAL DATA:  Cholecystitis and history of coronary artery disease with prior CABG. Mildly elevated troponin level.  EXAM: MYOCARDIAL IMAGING WITH SPECT (REST AND PHARMACOLOGIC-STRESS)  GATED LEFT VENTRICULAR WALL MOTION STUDY  LEFT VENTRICULAR EJECTION FRACTION  TECHNIQUE: Standard myocardial SPECT imaging was performed after resting intravenous injection of 10 mCi Tc-68m sestamibi. Subsequently, intravenous infusion of Lexiscan was performed under the supervision of the Cardiology staff. At peak effect of the drug, 30 mCi Tc-6m sestamibi was injected intravenously and standard myocardial SPECT imaging was performed. Quantitative gated imaging was also performed to evaluate left ventricular wall motion, and estimate left ventricular ejection fraction.  COMPARISON:  None.  FINDINGS: Utilizing gated data, the end-diastolic volume is estimated to be 125 mL and the end systolic volume 44 mL. Calculated ejection fraction is 65%.  Gated wall motion analysis demonstrates mild septal hypokinesis which is likely secondary to prior CABG. No other wall motion abnormalities are identified.  SPECT imaging shows reversibility at  the level of the mid to basal septum extending into the inferoseptal wall and consistent with inducible ischemia. No other fixed or reversible perfusion defects are identified.  IMPRESSION: 1. Reversibility demonstrated at the level of the mid to basal septum and adjacent inferoseptal wall. Findings are consistent with inducible ischemia. No other perfusion defects are identified. 2. Normal left ventricular function with quantitative ejection fraction calculation of 65%. Mild septal hypokinesis can be attributed to prior CABG.   Electronically Signed   By: Aletta Edouard M.D.   On: 09/05/2013 13:40    ASSESSMENT AND PLAN 1. Acalculous cholecystitis  2. mechanical aortic valve replacement functioning normally  3. permanent atrial fibrillation controlled on carvedilol. Coumadin on hold pending possible surgery for gallbladder and pending on cardiac catheterization. 4. coronary artery disease with history of coronary artery bypass graft surgery in October 2013 at the time of his aortic valve replacement. Mildly elevated troponin levels.  Lexus scan Myoview results show reversible inferoseptal ischemia . On aspirin, beta blocker, and statin.  For cardiac catheterization Monday at [facility].   Signed, Darlin Coco MD

## 2013-09-06 NOTE — Progress Notes (Signed)
Went to administer patient's 1800 dose of carvedilol.  Patient HR brady.  Upon review, patient's HR is dipping down into 40s, and sustaining at 50s/60s.  Cardiology paged with no response.  Hospitalist paged, carvedilol held per MD order.  Patient asymptomatic, awake, comfortable with family at bedside.  Will continue to monitor.

## 2013-09-06 NOTE — Progress Notes (Signed)
Patient ID: [name], male   DOB: 11-08-1933, 80 y.o.   MRN: [id number] The patient is for cardiac catherization tomorrow.  Dr. Zella Richer will follow-up as to surgical disposition and further recommendations.    Matt B. Hassell Done, MD, [facility] [phone] beeper [phone]  09/06/2013 10:56 AM

## 2013-09-06 NOTE — Progress Notes (Signed)
RN notified the PCP on call r/t HR decreasing steadily. Heart Rate in controlled A.fib has been 40-60 since beginning of shift change @ 1900. Awaiting any new orders. Will continue to monitor the patient.

## 2013-09-06 NOTE — Progress Notes (Signed)
ANTICOAGULATION CONSULT NOTE - Follow up  Pharmacy Consult for Heparin Indication: St Jude mechanical aortic valve  No Known Allergies  Patient Measurements: Height: 6\' 4"  (193 cm) Weight: 228 lb 6.3 oz (103.6 kg) IBW/kg (Calculated) : 86.8 Heparin Dosing Weight: 103.6 kg  Vital Signs: Temp: 97.5 F (36.4 C) (01/11 0604) Temp src: Oral (01/11 0604) BP: 153/74 mmHg (01/11 0604) Pulse Rate: 106 (01/11 0604)  Labs:  Recent Labs  09/04/13 0500  09/04/13 2044 09/05/13 0530 09/06/13 0522  HGB 11.6*  --   --  11.7* 11.3*  HCT 32.7*  --   --  34.2* 32.7*  PLT 182  --   --  187 241  LABPROT 19.9*  --   --  17.0* 16.0*  INR 1.75*  --   --  1.42 1.31  HEPARINUNFRC 0.12*  < > 0.36 0.53 0.57  CREATININE 0.91  --   --  0.90 1.03  < > = values in this interval not displayed.  Estimated Creatinine Clearance: 73.7 ml/min (by C-G formula based on Cr of 1.03).  Medications:  Scheduled:  . aspirin EC  81 mg Oral Daily  . atorvastatin  40 mg Oral Daily  . carvedilol  25 mg Oral BID WC  . darifenacin  15 mg Oral Daily  . digoxin  0.125 mg Oral Daily  . docusate sodium  100 mg Oral BID  . multivitamin with minerals  1 tablet Oral Daily  . pantoprazole (PROTONIX) IV  40 mg Intravenous Q12H  . piperacillin-tazobactam (ZOSYN)  IV  3.375 g Intravenous Q8H  . potassium chloride  10 mEq Oral Daily  . senna  1 tablet Oral BID  . sodium chloride  3 mL Intravenous Q12H  . tamsulosin  0.4 mg Oral Daily  . vitamin B-12  1,000 mcg Oral Daily   Infusions:  . sodium chloride 50 mL/hr at 09/04/13 1737  . heparin 1,900 Units/hr (09/05/13 1840)    Assessment: 80 yo male on chronic warfarin for mechanical St. Jude AVR and afib. Warfarin is now on hold for possible surgery for acute cholecystitis, HIDA scan pending. Now that INR is < 2.0, Cardiology has ordered to begin IV heparin per Pharmacy.  Heparin level remains within target range on current rate.  CBC stable. No bleeding  reported/documented.  Concurrent aspirin 81mg  daily noted.  Goal of Therapy:  Heparin level 0.3-0.7 units/ml Monitor platelets by anticoagulation protocol: Yes   Plan:   Continue heparin at 1900 units/hr (19 units/kg/hr).  Daily heparin level, CBC.  F/u plans for resuming warfarin if no surgery.  Romeo Rabon, PharmD, pager [phone]. 09/06/2013,7:16 AM.

## 2013-09-07 ENCOUNTER — Encounter (HOSPITAL_COMMUNITY)
Admission: EM | Disposition: A | Discharge status: Home Health | Payer: Self-pay | Source: Home / Self Care | Attending: Internal Medicine

## 2013-09-07 DIAGNOSIS — I2510 Atherosclerotic heart disease of native coronary artery without angina pectoris: Secondary | ICD-10-CM

## 2013-09-07 HISTORY — PX: LEFT HEART CATHETERIZATION WITH CORONARY/GRAFT ANGIOGRAM: SHX5450

## 2013-09-07 LAB — CBC
HEMATOCRIT: 35 % — AB (ref 39.0–52.0)
Hemoglobin: 11.9 g/dL — ABNORMAL LOW (ref 13.0–17.0)
MCH: 28.3 pg (ref 26.0–34.0)
MCHC: 34 g/dL (ref 30.0–36.0)
MCV: 83.1 fL (ref 78.0–100.0)
Platelets: 257 10*3/uL (ref 150–400)
RBC: 4.21 MIL/uL — ABNORMAL LOW (ref 4.22–5.81)
RDW: 12.7 % (ref 11.5–15.5)
WBC: 6.4 10*3/uL (ref 4.0–10.5)

## 2013-09-07 LAB — COMPREHENSIVE METABOLIC PANEL
ALBUMIN: 3.2 g/dL — AB (ref 3.5–5.2)
ALT: 63 U/L — ABNORMAL HIGH (ref 0–53)
AST: 33 U/L (ref 0–37)
Alkaline Phosphatase: 104 U/L (ref 39–117)
BUN: 7 mg/dL (ref 6–23)
CALCIUM: 9.5 mg/dL (ref 8.4–10.5)
CO2: 22 mEq/L (ref 19–32)
Chloride: 98 mEq/L (ref 96–112)
Creatinine, Ser: 0.86 mg/dL (ref 0.50–1.35)
GFR calc non Af Amer: 82 mL/min — ABNORMAL LOW (ref >90)
Glucose, Bld: 116 mg/dL — ABNORMAL HIGH (ref 70–99)
Potassium: 3.9 mEq/L (ref 3.7–5.3)
SODIUM: 134 meq/L — AB (ref 137–147)
TOTAL PROTEIN: 6.5 g/dL (ref 6.0–8.3)
Total Bilirubin: 0.5 mg/dL (ref 0.3–1.2)

## 2013-09-07 LAB — DIGOXIN LEVEL: Digoxin Level: 0.6 ng/mL — ABNORMAL LOW (ref 0.8–2.0)

## 2013-09-07 LAB — MAGNESIUM: Magnesium: 1.9 mg/dL (ref 1.5–2.5)

## 2013-09-07 LAB — PROTIME-INR
INR: 1.23 (ref 0.00–1.49)
Prothrombin Time: 15.2 seconds (ref 11.6–15.2)

## 2013-09-07 LAB — HEPARIN LEVEL (UNFRACTIONATED): Heparin Unfractionated: 0.57 IU/mL (ref 0.30–0.70)

## 2013-09-07 SURGERY — LEFT HEART CATHETERIZATION WITH CORONARY/GRAFT ANGIOGRAM
Anesthesia: LOCAL

## 2013-09-07 MED ORDER — HYDRALAZINE HCL 20 MG/ML IJ SOLN
10.0000 mg | Freq: Once | INTRAMUSCULAR | Status: AC
  Administered 2013-09-07: 10 mg via INTRAVENOUS

## 2013-09-07 MED ORDER — MIDAZOLAM HCL 2 MG/2ML IJ SOLN
INTRAMUSCULAR | Status: AC
  Filled 2013-09-07: qty 2

## 2013-09-07 MED ORDER — LIDOCAINE HCL (PF) 1 % IJ SOLN
INTRAMUSCULAR | Status: AC
  Filled 2013-09-07: qty 30

## 2013-09-07 MED ORDER — SODIUM CHLORIDE 0.9 % IV SOLN
INTRAVENOUS | Status: DC
  Administered 2013-09-08 – 2013-09-10 (×3): via INTRAVENOUS

## 2013-09-07 MED ORDER — HYDRALAZINE HCL 20 MG/ML IJ SOLN
INTRAMUSCULAR | Status: AC
  Filled 2013-09-07: qty 1

## 2013-09-07 MED ORDER — FENTANYL CITRATE 0.05 MG/ML IJ SOLN
INTRAMUSCULAR | Status: AC
  Filled 2013-09-07: qty 2

## 2013-09-07 MED ORDER — ONDANSETRON HCL 4 MG/2ML IJ SOLN: 4.0000 mg | Freq: Four times a day (QID) | INTRAMUSCULAR | Status: DC | PRN

## 2013-09-07 MED ORDER — HEPARIN (PORCINE) IN NACL 100-0.45 UNIT/ML-% IJ SOLN
1900.0000 [IU]/h | INTRAMUSCULAR | Status: DC
  Administered 2013-09-07: 1900 [IU]/h via INTRAVENOUS
  Filled 2013-09-07 (×2): qty 250

## 2013-09-07 MED ORDER — ACETAMINOPHEN 325 MG PO TABS: 650.0000 mg | ORAL_TABLET | ORAL | Status: DC | PRN

## 2013-09-07 MED ORDER — HEPARIN (PORCINE) IN NACL 2-0.9 UNIT/ML-% IJ SOLN
INTRAMUSCULAR | Status: AC
  Filled 2013-09-07: qty 1500

## 2013-09-07 NOTE — Progress Notes (Signed)
ANTICOAGULATION CONSULT NOTE - Follow Up Consult  Pharmacy Consult for IV heparin Indication: Mechanical AVR (St.Jude), atrial fibrillation  No Known Allergies  Patient Measurements: Height: 6\' 4"  (193 cm) Weight: 225 lb 1.6 oz (102.105 kg) IBW/kg (Calculated) : 86.8 Heparin Dosing Weight: 103.6 kg   Labs:  Recent Labs  09/05/13 0530 09/06/13 0522 09/07/13 0527  HGB 11.7* 11.3* 11.9*  HCT 34.2* 32.7* 35.0*  PLT 187 241 257  LABPROT 17.0* 16.0* 15.2  INR 1.42 1.31 1.23  HEPARINUNFRC 0.53 0.57 0.57  CREATININE 0.90 1.03 0.86    Estimated Creatinine Clearance: 88.3 ml/min (by C-G formula based on Cr of 0.86).   Assessment: 89 yoM on Coumadin PTA for h/o aortic valve replacement and atrial fibrillation (Goal INR 2-3 per [facility]). Patient was admitted with cholecystitis and given elevated troponin - Coumadin stopped, IV heparin started. CCS noted so surgery planned until cardiac work up and treatment is completed. Patient went for cardiac catherization this AM.  Post-cath, MD ordered to resume IV heparin 8hr post sheath removal. Sheath removal time was ~1045 this AM.   Heparin levels were therapeutic on IV heparin 1900 units/hr the past few days and this AM.  Hgb stable, plts wnl.   Goal of Therapy:  Heparin level 0.3-0.7 units/ml Monitor platelets by anticoagulation protocol: Yes   Plan:   At [place], restart IV heparin at previously therapeutic dose of 1900 units/hr. NO BOLUS  Will push heparin level to tomorrow morning at 0500  Daily heparin level and CBC  Vanessa Beckemeyer, PharmD, BCPS Pager: [phone] 2:47 PM Pharmacy #: [phone]

## 2013-09-07 NOTE — Progress Notes (Signed)
PT Cancellation Note  Patient Details Name: RONZELL LABAN MRN: [id number] DOB: Jan 26, 1934   Cancelled Treatment:    Reason Eval/Treat Not Completed: Patient at procedure or test/unavailable Pt at [facility] Cath Lab for procedure.   Tamaya Pun,KATHrine E 09/07/2013, 9:40 AM Carmelia Bake, PT, DPT 09/07/2013 Pager: [phone]

## 2013-09-07 NOTE — CV Procedure (Signed)
     Left Heart Catheterization with Coronary and Bypass Angiography  Report  SAVIOR HIMEBAUGH  80 y.o.  male 20-Aug-1934  Procedure Date: 09/07/2013  Referring Physician: Mare Ferrari, M.D./Peter Claiborne Rigg, MD Primary Cardiologist:: Moses Manners, MD  INDICATIONS: Elevated troponin levels and mildly abnormal nuclear perfusion study. This study is being done to evaluate graft patency prior to noncardiac surgery  PROCEDURE: 1. Left heart catheterization; 2. Selective coronary angiography; 3. Bypass graft angiography; 4. Left internal mammary artery angiography  CONSENT:  The risks, benefits, and details of the procedure were explained in detail to the patient. Risks including death, stroke, heart attack, kidney injury, allergy, limb ischemia, bleeding and radiation injury were discussed.  The patient verbalized understanding and wanted to proceed.  Informed written consent was obtained.  PROCEDURE TECHNIQUE:  After Xylocaine anesthesia a 5 French sheath was placed in the right femoral artery with a single anterior needle wall stick.  Coronary angiography was done using a 5 [place] A2 MP, 5 [place] JL4, and IM  catheters.  Left ventriculography was not done.  MEDICATIONS: Fentanyl 50 mcg IV and Versed 1 mg IV    CONTRAST:  Total of 100 cc.  COMPLICATIONS:  None   HEMODYNAMICS:  Aortic pressure 179/78 mmHg; LV pressure not performed due to mechanical aortic valve preventing LV entry  ANGIOGRAPHIC DATA:   The left main coronary artery is widely patent.  The left anterior descending artery is totally occluded proximally.  The left circumflex artery patent but contains an eccentric high-grade mid stenosis. There is competition with distal obtuse marginal branches from the saphenous vein graft which attaches to the second obtuse marginal.  The right coronary artery is totally occluded in the mid vessel.  BYPASS GRAFT ANGIOGRAPHY:   SVG to RCA: Segmental 50% proximal narrowing. No flow  obstructing lesion is felt to be present  SVG to obtuse marginal: Widely patent with no significant obstruction in the vein graft. There is high-grade obstruction in the obtuse marginal segment proximal to the graft insertion site. This is not approachable by PCI to the vein graft. There is retrograde filling of the third obtuse marginal through this region of obstruction. The third obtuse marginal also receives antegrade flow via the native circumflex.  Saphenous vein graft to the diagonal: This is a large patulous graft that is widely patent.  Left internal mammary graft to LAD: Widely patent   LEFT VENTRICULOGRAM:  Left ventricular angiogram is not performed.   IMPRESSIONS:  1. Widely patent saphenous vein grafts as noted above. Graft to the LAD via the internal mammary is also widely patent. 2. 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 3. Potential ischemia in the third obtuse marginal territory as flow is compromised by obstruction in the second obtuse marginal proximal to the vein graft and the 80% stenosis in the mid circumflex. This territory is relatively small and should be treatable with medical therapy. If refractory angina, stenting of the mid circumflex would resolve this problem. This does not seem prudent in this situation given the need for general surgery and the absence of symptoms.   RECOMMENDATION:  Proceed with surgery on the patient's gallbladder. The only territory that is potentially ischemic is in the third obtuse marginal which has 2 sources of flow as noted above. Continue low-dose beta blocker therapy. Surgical risks should be low to moderate.

## 2013-09-07 NOTE — Interval H&P Note (Signed)
History and Physical Interval Note:  09/07/2013 9:29 AM  [name]  has presented today for surgery, with the diagnosis of cp  The various methods of treatment have been discussed with the patient and family. After consideration of risks, benefits and other options for treatment, the patient has consented to  Procedure(s): LEFT HEART CATHETERIZATION WITH CORONARY/GRAFT ANGIOGRAM (N/A) as a surgical intervention .  The patient's history has been reviewed, patient examined, no change in status, stable for surgery.  I have reviewed the patient's chart and labs.  Questions were answered to the patient's satisfaction.    I have reviewed the patient's records, spoken to the patient and family, and will proceed with coronary angiography to define graft patency. In this setting, the patient has persistently elevated troponin levels and a mildly abnormal myocardial perfusion study with basal septal ischemia both inferiorly and anteriorly. This likely represents septal perforator obstruction related to residual disease that was not graftable. The study is being done to document graft patency.  The procedure and risks were discussed with the patient and family in detail including the possibility of stroke, death, myocardial infarction, or other problems appear [place] W

## 2013-09-07 NOTE — H&P (View-Only) (Signed)
Patient Name: [name] Date of Encounter: 09/06/2013     Active Problems:   HYPERTENSION   CAD   Atrial fibrillation   AORTIC VALVE REPLACEMENT, HX OF   Chronic diastolic heart failure   Abdominal  pain, other specified site   Constipation   Hypotension   Leukocytosis   Normocytic anemia   Hyponatremia   Acalculous cholecystitis    SUBJECTIVE Lexa scan Myoview yesterday showed reversible inferoseptal ischemia.  The patient is scheduled for left heart cardiac catheterization tomorrow at [facility]. The patient is not having any chest pain or shortness of breath.  He remains on IV heparin for his mechanical aortic valve and for his chronic atrial fibrillation.  Fortunately he is not having any active abdominal pain from his cholecystitis at this point.  CURRENT MEDS . aspirin EC  81 mg Oral Daily  . atorvastatin  40 mg Oral Daily  . carvedilol  25 mg Oral BID WC  . darifenacin  15 mg Oral Daily  . digoxin  0.125 mg Oral Daily  . docusate sodium  100 mg Oral BID  . multivitamin with minerals  1 tablet Oral Daily  . pantoprazole (PROTONIX) IV  40 mg Intravenous Q12H  . piperacillin-tazobactam (ZOSYN)  IV  3.375 g Intravenous Q8H  . potassium chloride  10 mEq Oral Daily  . senna  1 tablet Oral BID  . sodium chloride  3 mL Intravenous Q12H  . tamsulosin  0.4 mg Oral Daily  . vitamin B-12  1,000 mcg Oral Daily    OBJECTIVE  Filed Vitals:   09/05/13 0549 09/05/13 1300 09/05/13 2230 09/06/13 0604  BP: 145/81 157/69 138/74 153/74  Pulse: 64  59 106  Temp: 97.9 F (36.6 C)  97.8 F (36.6 C) 97.5 F (36.4 C)  TempSrc: Oral  Oral Oral  Resp: 18 18 18 18   Height:      Weight:      SpO2: 98% 98% 100% 97%    Intake/Output Summary (Last 24 hours) at 09/06/13 0836 Last data filed at 09/05/13 1852  Gross per 24 hour  Intake 1555.85 ml  Output      0 ml  Net 1555.85 ml   Filed Weights   09/01/13 1700  Weight: 228 lb 6.3 oz (103.6 kg)    PHYSICAL EXAM  General:  Pleasant, NAD. Neuro: Alert and oriented X 3. Moves all extremities spontaneously. Psych: Normal affect. HEENT:  Normal  Neck: Supple without bruits or JVD. Lungs:  Resp regular and unlabored, CTA. Heart: Irregular pulse.  Good aortic valve mechanical closure click. Abdomen: Soft, non-tender, non-distended, BS + x 4.  Extremities: No clubbing, cyanosis or edema. DP/PT/Radials 2+ and equal bilaterally.  Accessory Clinical Findings  CBC  Recent Labs  09/05/13 0530 09/06/13 0522  WBC 6.9 6.8  HGB 11.7* 11.3*  HCT 34.2* 32.7*  MCV 83.6 82.6  PLT 187 324   Basic Metabolic Panel  Recent Labs  09/05/13 0530 09/06/13 0522  NA 137 135*  K 4.2 4.1  CL 100 99  CO2 26 25  GLUCOSE 112* 122*  BUN 9 9  CREATININE 0.90 1.03  CALCIUM 9.4 9.3   Liver Function Tests  Recent Labs  09/05/13 0530 09/06/13 0522  AST 47* 33  ALT 73* 64*  ALKPHOS 109 102  BILITOT 0.4 0.4  PROT 6.0 6.1  ALBUMIN 2.9* 3.0*   No results found for this basename: LIPASE, AMYLASE,  in the last 72 hours Cardiac Enzymes No results found  for this basename: CKTOTAL, CKMB, CKMBINDEX, TROPONINI,  in the last 72 hours BNP No components found with this basename: POCBNP,  D-Dimer No results found for this basename: DDIMER,  in the last 72 hours Hemoglobin A1C No results found for this basename: HGBA1C,  in the last 72 hours Fasting Lipid Panel No results found for this basename: CHOL, HDL, LDLCALC, TRIG, CHOLHDL, LDLDIRECT,  in the last 72 hours Thyroid Function Tests No results found for this basename: TSH, T4TOTAL, FREET3, T3FREE, THYROIDAB,  in the last 72 hours  TELE  Atrial fibrillation with controlled ventricular response  ECG 09/02/13  Atrial fibrillation RSR' or QR pattern in V1 suggests right ventricular conduction delay U waves present Abnormal ECG   Radiology/Studies  Dg Chest 2 View  09/01/2013   CLINICAL DATA:  Weakness. Atrial fibrillation. High blood pressure. Renal cell  carcinoma. Prior smoker.  EXAM: CHEST  2 VIEW  COMPARISON:  06/16/2012.  FINDINGS: Post median sternotomy with fracture of upper sternal wires. Post CABG. Heart size top-normal. Calcified mildly tortuous aorta.  T12 compression fracture with prior cement augmentation unchanged. No new compression fracture detected.  No infiltrate, congestive heart failure or pneumothorax.  No plain film evidence of pulmonary malignancy/metastatic disease.  IMPRESSION: Post median sternotomy with fracture of upper sternal wires. Post CABG. Heart size top-normal.  Calcified mildly tortuous aorta.  T12 compression fracture with prior cement augmentation unchanged. No new compression fracture detected.  No infiltrate, congestive heart failure or pneumothorax.  No plain film evidence of pulmonary malignancy/metastatic disease.   Electronically Signed   By: Chauncey Cruel M.D.   On: 09/01/2013 12:22   Nm Hepatobiliary Liver Func  09/03/2013   CLINICAL DATA:  Right upper quadrant pain  EXAM: NUCLEAR MEDICINE HEPATOBILIARY IMAGING  TECHNIQUE: Sequential images of the abdomen were obtained out to 60 minutes following intravenous administration of radiopharmaceutical.  COMPARISON:  None.  RADIOPHARMACEUTICALS:  5.65mCi Tc-88m Choletec  FINDINGS: Following the intravenous administration of the radiopharmaceutical there is uniform tracer uptake with clearance from blood pool. Tracer activity within the common bile duct and small bowel loops noted by 15 min. After 60 min no activity is noted within the gallbladder. The patient was given 4 mg of morphine sulfate, IV and imaging was carried out for an additional 30 min. 15 min after administering morphine radiotracer began accumulating within the gallbladder.  IMPRESSION: 1. Patent cystic duct without evidence for acute cholecystitis. 2. Delayed gallbladder filling compatible with chronic cholecystitis.   Electronically Signed   By: Kerby Moors M.D.   On: 09/03/2013 13:11   US Abdomen  Complete  09/01/2013   CLINICAL DATA:  Right upper quadrant pain  EXAM: ULTRASOUND ABDOMEN COMPLETE  COMPARISON:  None.  FINDINGS: Gallbladder:  There is mild gallbladder wall thickening to 6 mm. No pericholecystic fluid. There is dependent sludge within the gallbladder. Negative sonographic Murphy's sign.  Common bile duct:  Diameter: Normal diameter 4 mm.  Liver:  No focal lesion identified. Within normal limits in parenchymal echogenicity.  IVC:  No abnormality visualized.  Pancreas:  Visualized portion unremarkable.  Spleen:  Size and appearance within normal limits.  Right Kidney: 12.4cm in length. No evidence of hydronephrosis or stones. Anechoic 1.5 cm cyst in the upper pole. This is proteinaceous nonenhancing cyst on comparison CT.  Left Kidney:  Length: 12.7. Echogenicity within normal limits. No mass or hydronephrosis visualized.  Abdominal aorta:  No aneurysm visualized.  IMPRESSION:  Gallbladder sludge and gallbladder wall thickening without evidence of acute  cholecystitis. The findings could represent chronic cholecystitis.   Electronically Signed   By: Suzy Bouchard M.D.   On: 09/01/2013 12:06   Ct Abdomen Pelvis W Contrast  09/01/2013   CLINICAL DATA:  Mid abdominal discomfort and generalized weakness, history of renal cell malignancy and radiofrequency ablation.  EXAM: CT ABDOMEN AND PELVIS WITH CONTRAST  TECHNIQUE: Multidetector CT imaging of the abdomen and pelvis was performed using the standard protocol following bolus administration of intravenous contrast.  CONTRAST:  122mL OMNIPAQUE IOHEXOL 300 MG/ML  SOLN  COMPARISON:  CT scan of the abdomen dated June 16, 2013.  FINDINGS: The gallbladder is abnormal in appearance. It is mildly distended and exhibits wall thickening and pericholecystic fluid. No calcified or noncalcified stones are demonstrated. The adjacent liver is normal in density and contour. There may be a trace of intrahepatic ductal dilation. The pancreas is somewhat atrophic  and exhibits no evidence of inflammatory change or focal mass or ductal dilation. The stomach is moderately distended with food particles. The spleen is not enlarged. There are no adrenal masses  The kidneys exhibit no evidence of obstruction. There are findings consistent with the previous radiofrequency ablation on the right. There is a small hypodense focus medial to the lower pole of the right kidney which is stable. It is demonstrated on image 52 of series 2. It measures 1.6 by a approximately 1 cm. A 2nd cortically based midpole hypodensity on the right is stable and is most compatible with a cyst. There is a stable 1.5 cm diameter hypodensity in the lateral aspect of the mid to upper pole of the right kidney demonstrated best on image 41 of series 2. In the medial aspect of the mid to upper pole on image 42 of series 2 there is a 1 cm diameter stable appearing hypodensity. On the left there is no evidence of obstruction or parenchymal masses. A subcentimeter hypodensity in the midpole laterally is consistent with a cyst.  On delayed images contrast within the renal collecting systems is within the limits of normal.  The caliber of the abdominal aorta is normal. The periaortic and pericaval regions also appear normal. The partially contrast filled loops of small and large bowel exhibit no evidence of ileus nor of obstruction. A normal calibered, partially contrast-filled, uninflamed-appearing appendix is demonstrated. Within the pelvis the urinary bladder is mildly distended. The prostate gland is mildly enlarged and produces an impression upon the urinary bladder base. The seminal vesicles and prostate gland appear normal. There is a small fat containing left inguinal hernia. There is a tiny fat containing umbilical hernia.  The right lung base demonstrates very mild subsegmental atelectasis posteriorly. The lumbar vertebral bodies are preserved in height. There is degenerative disc change at multiple lumbar  levels. There is partial compression of the body of T12 which appears to have been previously treated with kyphoplasty.  IMPRESSION: 1. The appearance of the gallbladder is abnormal suggesting acalculous cholecystitis. Ultrasound of the gallbladder would be useful. 2. The appearance of both kidneys is stable with no evidence of obstruction. Post ablation changes on the right are present and stable. There are stable hypodensities remaining in the renal cortex of the right kidney. 3. No acute bowel abnormality is demonstrated. 4. There is mild distention of the urinary bladder which may be related to the mildly enlarged prostate gland. Foley catheterization would be useful.   Electronically Signed   By: David  Martinique   On: 09/01/2013 19:08   Nm Myocar Multi W/spect  W/wall Motion / Ef  09/05/2013   CLINICAL DATA:  Cholecystitis and history of coronary artery disease with prior CABG. Mildly elevated troponin level.  EXAM: MYOCARDIAL IMAGING WITH SPECT (REST AND PHARMACOLOGIC-STRESS)  GATED LEFT VENTRICULAR WALL MOTION STUDY  LEFT VENTRICULAR EJECTION FRACTION  TECHNIQUE: Standard myocardial SPECT imaging was performed after resting intravenous injection of 10 mCi Tc-58m sestamibi. Subsequently, intravenous infusion of Lexiscan was performed under the supervision of the Cardiology staff. At peak effect of the drug, 30 mCi Tc-46m sestamibi was injected intravenously and standard myocardial SPECT imaging was performed. Quantitative gated imaging was also performed to evaluate left ventricular wall motion, and estimate left ventricular ejection fraction.  COMPARISON:  None.  FINDINGS: Utilizing gated data, the end-diastolic volume is estimated to be 125 mL and the end systolic volume 44 mL. Calculated ejection fraction is 65%.  Gated wall motion analysis demonstrates mild septal hypokinesis which is likely secondary to prior CABG. No other wall motion abnormalities are identified.  SPECT imaging shows reversibility at  the level of the mid to basal septum extending into the inferoseptal wall and consistent with inducible ischemia. No other fixed or reversible perfusion defects are identified.  IMPRESSION: 1. Reversibility demonstrated at the level of the mid to basal septum and adjacent inferoseptal wall. Findings are consistent with inducible ischemia. No other perfusion defects are identified. 2. Normal left ventricular function with quantitative ejection fraction calculation of 65%. Mild septal hypokinesis can be attributed to prior CABG.   Electronically Signed   By: Aletta Edouard M.D.   On: 09/05/2013 13:40    ASSESSMENT AND PLAN 1. Acalculous cholecystitis  2. mechanical aortic valve replacement functioning normally  3. permanent atrial fibrillation controlled on carvedilol. Coumadin on hold pending possible surgery for gallbladder and pending on cardiac catheterization. 4. coronary artery disease with history of coronary artery bypass graft surgery in October 2013 at the time of his aortic valve replacement. Mildly elevated troponin levels.  Lexus scan Myoview results show reversible inferoseptal ischemia . On aspirin, beta blocker, and statin.  For cardiac catheterization Monday at [facility].   Signed, Darlin Coco MD

## 2013-09-07 NOTE — Progress Notes (Signed)
[facility] PROGRESS NOTE  [name] ZOX:[id number] DOB: 16-Oct-1933 DOA: 09/01/2013 PCP: Gennette Pac, MD  Assessment/Plan: Acalculus cholecystitis  -OKAY to proceed with surgery per Cardiology -AST/ALT remain mildly elevated  -Lipase and Lactic acid neg. UA neg.  -patient also has history of RCC s/p two radiofrequency ablations.  - CT abd/pelvis--gallbladder wall thickening with pericholecystic fluid and trace biliary ductal dilatation  - General surgery consultation given suspicion of chronic cholecystitis  -HIDA--patent cystic duct, chronic cholecystitis  - continue zosyn-->clinically improved with abx  -diet was advance and pt tolerated today  -appreciate surgery input  -no surgery until cardiac status is clarified/resolved  CAD with mild elevation of troponin/abnormal nuclear stress test  -Heart catheterization 09/07/2013--widely patent grafts, potential ischemia to the third obtuse marginal territory  -myoview--reveals inducible ischemia, EF 65%   -s/p CABG/AVR,  - ASA, statin, BB  -Appreciate Cardiology following  -restart demadex if okay with cardiology  Permanent atrial fibrillation  -on A/C and rate controlled on BB and dig  - Heparin gtt with INR < 2 for anticipation of surgery  -Echo EF 55-60%, well function AV prosthesis  Progressive generalized weakness  -overall improving during hospitalization  -No focal abnormalities  - Multifactorial including deconditioning as well as his infectious process and other electrolyte arrangements  - IVF  - PT-recommended home health PT  - TSH--0.875  - Cortisol 14.1  Mechanical AVR  -IV heparin when INR <2 if surgery is anticipated  GERD,  -possible acid-reflux like symptoms  -Continue PPI  Leukocytosis  -Due to acute medical condition  -Improved  -Afebrile and hemodynamically stable  Family Communication: wife and daughter at beside  Disposition Plan: Home when medically stable  Antibiotics:  Zosyn  09/02/13>>>          Procedures/Studies: Dg Chest 2 View  09/01/2013   CLINICAL DATA:  Weakness. Atrial fibrillation. High blood pressure. Renal cell carcinoma. Prior smoker.  EXAM: CHEST  2 VIEW  COMPARISON:  06/16/2012.  FINDINGS: Post median sternotomy with fracture of upper sternal wires. Post CABG. Heart size top-normal. Calcified mildly tortuous aorta.  T12 compression fracture with prior cement augmentation unchanged. No new compression fracture detected.  No infiltrate, congestive heart failure or pneumothorax.  No plain film evidence of pulmonary malignancy/metastatic disease.  IMPRESSION: Post median sternotomy with fracture of upper sternal wires. Post CABG. Heart size top-normal.  Calcified mildly tortuous aorta.  T12 compression fracture with prior cement augmentation unchanged. No new compression fracture detected.  No infiltrate, congestive heart failure or pneumothorax.  No plain film evidence of pulmonary malignancy/metastatic disease.   Electronically Signed   By: Chauncey Cruel M.D.   On: 09/01/2013 12:22   Nm Hepatobiliary Liver Func  09/03/2013   CLINICAL DATA:  Right upper quadrant pain  EXAM: NUCLEAR MEDICINE HEPATOBILIARY IMAGING  TECHNIQUE: Sequential images of the abdomen were obtained out to 60 minutes following intravenous administration of radiopharmaceutical.  COMPARISON:  None.  RADIOPHARMACEUTICALS:  5.32mCi Tc-9m Choletec  FINDINGS: Following the intravenous administration of the radiopharmaceutical there is uniform tracer uptake with clearance from blood pool. Tracer activity within the common bile duct and small bowel loops noted by 15 min. After 60 min no activity is noted within the gallbladder. The patient was given 4 mg of morphine sulfate, IV and imaging was carried out for an additional 30 min. 15 min after administering morphine radiotracer began accumulating within the gallbladder.  IMPRESSION: 1. Patent cystic duct without evidence for acute cholecystitis. 2.  Delayed gallbladder filling compatible  with chronic cholecystitis.   Electronically Signed   By: Kerby Moors M.D.   On: 09/03/2013 13:11   US Abdomen Complete  09/01/2013   CLINICAL DATA:  Right upper quadrant pain  EXAM: ULTRASOUND ABDOMEN COMPLETE  COMPARISON:  None.  FINDINGS: Gallbladder:  There is mild gallbladder wall thickening to 6 mm. No pericholecystic fluid. There is dependent sludge within the gallbladder. Negative sonographic Murphy's sign.  Common bile duct:  Diameter: Normal diameter 4 mm.  Liver:  No focal lesion identified. Within normal limits in parenchymal echogenicity.  IVC:  No abnormality visualized.  Pancreas:  Visualized portion unremarkable.  Spleen:  Size and appearance within normal limits.  Right Kidney: 12.4cm in length. No evidence of hydronephrosis or stones. Anechoic 1.5 cm cyst in the upper pole. This is proteinaceous nonenhancing cyst on comparison CT.  Left Kidney:  Length: 12.7. Echogenicity within normal limits. No mass or hydronephrosis visualized.  Abdominal aorta:  No aneurysm visualized.  IMPRESSION:  Gallbladder sludge and gallbladder wall thickening without evidence of acute cholecystitis. The findings could represent chronic cholecystitis.   Electronically Signed   By: Suzy Bouchard M.D.   On: 09/01/2013 12:06   Ct Abdomen Pelvis W Contrast  09/01/2013   CLINICAL DATA:  Mid abdominal discomfort and generalized weakness, history of renal cell malignancy and radiofrequency ablation.  EXAM: CT ABDOMEN AND PELVIS WITH CONTRAST  TECHNIQUE: Multidetector CT imaging of the abdomen and pelvis was performed using the standard protocol following bolus administration of intravenous contrast.  CONTRAST:  120mL OMNIPAQUE IOHEXOL 300 MG/ML  SOLN  COMPARISON:  CT scan of the abdomen dated June 16, 2013.  FINDINGS: The gallbladder is abnormal in appearance. It is mildly distended and exhibits wall thickening and pericholecystic fluid. No calcified or noncalcified stones are  demonstrated. The adjacent liver is normal in density and contour. There may be a trace of intrahepatic ductal dilation. The pancreas is somewhat atrophic and exhibits no evidence of inflammatory change or focal mass or ductal dilation. The stomach is moderately distended with food particles. The spleen is not enlarged. There are no adrenal masses  The kidneys exhibit no evidence of obstruction. There are findings consistent with the previous radiofrequency ablation on the right. There is a small hypodense focus medial to the lower pole of the right kidney which is stable. It is demonstrated on image 52 of series 2. It measures 1.6 by a approximately 1 cm. A 2nd cortically based midpole hypodensity on the right is stable and is most compatible with a cyst. There is a stable 1.5 cm diameter hypodensity in the lateral aspect of the mid to upper pole of the right kidney demonstrated best on image 41 of series 2. In the medial aspect of the mid to upper pole on image 42 of series 2 there is a 1 cm diameter stable appearing hypodensity. On the left there is no evidence of obstruction or parenchymal masses. A subcentimeter hypodensity in the midpole laterally is consistent with a cyst.  On delayed images contrast within the renal collecting systems is within the limits of normal.  The caliber of the abdominal aorta is normal. The periaortic and pericaval regions also appear normal. The partially contrast filled loops of small and large bowel exhibit no evidence of ileus nor of obstruction. A normal calibered, partially contrast-filled, uninflamed-appearing appendix is demonstrated. Within the pelvis the urinary bladder is mildly distended. The prostate gland is mildly enlarged and produces an impression upon the urinary bladder base. The seminal  vesicles and prostate gland appear normal. There is a small fat containing left inguinal hernia. There is a tiny fat containing umbilical hernia.  The right lung base demonstrates  very mild subsegmental atelectasis posteriorly. The lumbar vertebral bodies are preserved in height. There is degenerative disc change at multiple lumbar levels. There is partial compression of the body of T12 which appears to have been previously treated with kyphoplasty.  IMPRESSION: 1. The appearance of the gallbladder is abnormal suggesting acalculous cholecystitis. Ultrasound of the gallbladder would be useful. 2. The appearance of both kidneys is stable with no evidence of obstruction. Post ablation changes on the right are present and stable. There are stable hypodensities remaining in the renal cortex of the right kidney. 3. No acute bowel abnormality is demonstrated. 4. There is mild distention of the urinary bladder which may be related to the mildly enlarged prostate gland. Foley catheterization would be useful.   Electronically Signed   By: Mohmmad Saleeby  [place]   On: 09/01/2013 19:08   Nm Myocar Multi W/spect W/wall Motion / Ef  09/05/2013   CLINICAL DATA:  Cholecystitis and history of coronary artery disease with prior CABG. Mildly elevated troponin level.  EXAM: MYOCARDIAL IMAGING WITH SPECT (REST AND PHARMACOLOGIC-STRESS)  GATED LEFT VENTRICULAR WALL MOTION STUDY  LEFT VENTRICULAR EJECTION FRACTION  TECHNIQUE: Standard myocardial SPECT imaging was performed after resting intravenous injection of 10 mCi Tc-31m sestamibi. Subsequently, intravenous infusion of Lexiscan was performed under the supervision of the Cardiology staff. At peak effect of the drug, 30 mCi Tc-47m sestamibi was injected intravenously and standard myocardial SPECT imaging was performed. Quantitative gated imaging was also performed to evaluate left ventricular wall motion, and estimate left ventricular ejection fraction.  COMPARISON:  None.  FINDINGS: Utilizing gated data, the end-diastolic volume is estimated to be 125 mL and the end systolic volume 44 mL. Calculated ejection fraction is 65%.  Gated wall motion analysis demonstrates  mild septal hypokinesis which is likely secondary to prior CABG. No other wall motion abnormalities are identified.  SPECT imaging shows reversibility at the level of the mid to basal septum extending into the inferoseptal wall and consistent with inducible ischemia. No other fixed or reversible perfusion defects are identified.  IMPRESSION: 1. Reversibility demonstrated at the level of the mid to basal septum and adjacent inferoseptal wall. Findings are consistent with inducible ischemia. No other perfusion defects are identified. 2. Normal left ventricular function with quantitative ejection fraction calculation of 65%. Mild septal hypokinesis can be attributed to prior CABG.   Electronically Signed   By: Aletta Edouard M.D.   On: 09/05/2013 13:40         Subjective: Patient is feeling well. He denies fevers, chills, chest discomfort, shortness breath, nausea, vomiting, diarrhea, abdominal pain, dysuria, hematuria.  Objective: Filed Vitals:   09/06/13 1429 09/06/13 2100 09/07/13 0434 09/07/13 0915  BP: 156/62 139/70 161/67   Pulse: 78 85 67 69  Temp: 97.9 F (36.6 C) 98 F (36.7 C) 97.5 F (36.4 C)   TempSrc: Oral Oral Oral   Resp: 16 18 18    Height:      Weight:   102.105 kg (225 lb 1.6 oz)   SpO2: 97% 99% 99%     Intake/Output Summary (Last 24 hours) at 09/07/13 1427 Last data filed at 09/07/13 0659  Gross per 24 hour  Intake 2481.85 ml  Output   2700 ml  Net -218.15 ml   Weight change:  Exam:   General:  Pt is alert,  follows commands appropriately, not in acute distress  HEENT: No icterus, No thrush,[place]/AT  Cardiovascular: IRRR, S1/S2, no rubs, no gallops  Respiratory: CTA bilaterally, no wheezing, no crackles, no rhonchi  Abdomen: Soft/+BS, non tender, non distended, no guarding  Extremities: 1+LE edema, No lymphangitis, No petechiae, No rashes, no synovitis  Data Reviewed: Basic Metabolic Panel:  Recent Labs Lab 09/03/13 0511 09/04/13 0500 09/05/13 0530  09/06/13 0522 09/07/13 0527  NA 132* 135* 137 135* 134*  K 4.4 4.1 4.2 4.1 3.9  CL 96 97 100 99 98  CO2 24 26 26 25 22   GLUCOSE 103* 117* 112* 122* 116*  BUN 12 13 9 9 7   CREATININE 0.81 0.91 0.90 1.03 0.86  CALCIUM 9.4 9.5 9.4 9.3 9.5  MG  --   --   --   --  1.9   Liver Function Tests:  Recent Labs Lab 09/03/13 0511 09/04/13 0500 09/05/13 0530 09/06/13 0522 09/07/13 0527  AST 89* 62* 47* 33 33  ALT 76* 77* 73* 64* 63*  ALKPHOS 112 114 109 102 104  BILITOT 0.7 0.5 0.4 0.4 0.5  PROT 6.0 6.1 6.0 6.1 6.5  ALBUMIN 2.9* 2.9* 2.9* 3.0* 3.2*    Recent Labs Lab 09/01/13 1055  LIPASE 17   No results found for this basename: AMMONIA,  in the last 168 hours CBC:  Recent Labs Lab 09/01/13 1055  09/03/13 0511 09/04/13 0500 09/05/13 0530 09/06/13 0522 09/07/13 0527  WBC 11.7*  < > 7.0 6.9 6.9 6.8 6.4  NEUTROABS 9.2*  --   --   --   --   --   --   HGB 12.2*  < > 11.3* 11.6* 11.7* 11.3* 11.9*  HCT 33.8*  < > 32.7* 32.7* 34.2* 32.7* 35.0*  MCV 82.2  < > 83.6 82.2 83.6 82.6 83.1  PLT 152  < > 144* 182 187 241 257  < > = values in this interval not displayed. Cardiac Enzymes:  Recent Labs Lab 09/01/13 1055 09/01/13 1440 09/01/13 1805 09/01/13 2027  TROPONINI 0.47* 0.46* 0.49* 0.44*   BNP: No components found with this basename: POCBNP,  CBG:  Recent Labs Lab 09/02/13 2157 09/03/13 0736  GLUCAP 134* 110*    No results found for this or any previous visit (from the past 240 hour(s)).   Scheduled Meds: . aspirin EC  81 mg Oral Daily  . atorvastatin  40 mg Oral Daily  . carvedilol  25 mg Oral BID WC  . darifenacin  15 mg Oral Daily  . digoxin  0.125 mg Oral Daily  . docusate sodium  100 mg Oral BID  . multivitamin with minerals  1 tablet Oral Daily  . pantoprazole (PROTONIX) IV  40 mg Intravenous Q12H  . piperacillin-tazobactam (ZOSYN)  IV  3.375 g Intravenous Q8H  . potassium chloride  10 mEq Oral Daily  . senna  1 tablet Oral BID  . tamsulosin  0.4  mg Oral Daily  . vitamin B-12  1,000 mcg Oral Daily   Continuous Infusions: . sodium chloride 50 mL/hr at 09/07/13 1106     Erdine Hulen, DO  [facility] Pager [phone]  If 7PM-7AM, please contact night-coverage [email or web address] Password [email or web address] 09/07/2013, 2:27 PM   LOS: 6 days

## 2013-09-07 NOTE — Progress Notes (Signed)
Day of Surgery  Subjective: Feels "great."  Eating dinner.  No abdominal pain.  Objective: Vital signs in last 24 hours: Temp:  [97.5 F (36.4 C)-98.4 F (36.9 C)] 98.4 F (36.9 C) (01/12 1428) Pulse Rate:  [67-85] 70 (01/12 1428) Resp:  [18] 18 (01/12 1428) BP: (139-167)/(67-87) 167/87 mmHg (01/12 1428) SpO2:  [99 %] 99 % (01/12 1428) Weight:  [225 lb 1.6 oz (102.105 kg)] 225 lb 1.6 oz (102.105 kg) (01/12 0434) Last BM Date: 09/05/13  Intake/Output from previous day: 01/11 0701 - 01/12 0700 In: 3011.9 [P.O.:1210; I.V.:1651.9; IV Piggyback:150] Out: 1937 [Urine:3950] Intake/Output this shift:    PE: General- In NAD Abdomen-soft, nontender   Lab Results:   Recent Labs  09/06/13 0522 09/07/13 0527  WBC 6.8 6.4  HGB 11.3* 11.9*  HCT 32.7* 35.0*  PLT 241 257   BMET  Recent Labs  09/06/13 0522 09/07/13 0527  NA 135* 134*  K 4.1 3.9  CL 99 98  CO2 25 22  GLUCOSE 122* 116*  BUN 9 7  CREATININE 1.03 0.86  CALCIUM 9.3 9.5   PT/INR  Recent Labs  09/06/13 0522 09/07/13 0527  LABPROT 16.0* 15.2  INR 1.31 1.23   Comprehensive Metabolic Panel:    Component Value Date/Time   NA 134* 09/07/2013 0527   K 3.9 09/07/2013 0527   CL 98 09/07/2013 0527   CO2 22 09/07/2013 0527   BUN 7 09/07/2013 0527   CREATININE 0.86 09/07/2013 0527   CREATININE 0.84 06/03/2013 0932   GLUCOSE 116* 09/07/2013 0527   CALCIUM 9.5 09/07/2013 0527   AST 33 09/07/2013 0527   ALT 63* 09/07/2013 0527   ALKPHOS 104 09/07/2013 0527   BILITOT 0.5 09/07/2013 0527   PROT 6.5 09/07/2013 0527   ALBUMIN 3.2* 09/07/2013 0527     Studies/Results: No results found.  Anti-infectives: Anti-infectives   Start     Dose/Rate Route Frequency Ordered Stop   09/02/13 1800  piperacillin-tazobactam (ZOSYN) IVPB 3.375 g     3.375 g 12.5 mL/hr over 240 Minutes Intravenous Every 8 hours 09/02/13 1719        Assessment Active Problems: Acalculous cholecystitis-currently asymptomatic.    HYPERTENSION  CAD-cardiac cath done today and he is okay to proceed with the surgery.   Atrial fibrillation   AORTIC VALVE REPLACEMENT, HX OF   Chronic diastolic heart failure   Abdominal  pain, other specified site   Constipation   Hypotension   Leukocytosis   Normocytic anemia   Hyponatremia       LOS: 6 days   Plan:   We discussed laparoscopic possible open cholecystectomy and he would like to have it done this admission.   I have explained the procedure, risks, and aftercare of cholecystectomy.  Risks include but are not limited to bleeding, infection, wound problems, anesthesia, diarrhea, bile leak, injury to common bile duct/liver/intestine.  He seems to understand and agrees to proceed.  Will need to stop his Heparin before the surgery.    Jeena Arnett J 09/07/2013

## 2013-09-07 NOTE — Progress Notes (Signed)
Heparin infusion stopped per order, [facility] has arrived to transport patient.

## 2013-09-08 ENCOUNTER — Encounter (HOSPITAL_COMMUNITY): Payer: Self-pay | Admitting: Anesthesiology

## 2013-09-08 ENCOUNTER — Inpatient Hospital Stay (HOSPITAL_COMMUNITY): Payer: Medicare Other

## 2013-09-08 ENCOUNTER — Inpatient Hospital Stay (HOSPITAL_COMMUNITY): Payer: Medicare Other | Admitting: Anesthesiology

## 2013-09-08 ENCOUNTER — Encounter (HOSPITAL_COMMUNITY): Payer: Medicare Other | Admitting: Anesthesiology

## 2013-09-08 ENCOUNTER — Encounter (HOSPITAL_COMMUNITY)
Admission: EM | Disposition: A | Discharge status: Home Health | Payer: Self-pay | Source: Home / Self Care | Attending: Internal Medicine

## 2013-09-08 DIAGNOSIS — R799 Abnormal finding of blood chemistry, unspecified: Secondary | ICD-10-CM

## 2013-09-08 DIAGNOSIS — K812 Acute cholecystitis with chronic cholecystitis: Secondary | ICD-10-CM

## 2013-09-08 DIAGNOSIS — K8010 Calculus of gallbladder with chronic cholecystitis without obstruction: Principal | ICD-10-CM

## 2013-09-08 DIAGNOSIS — R4182 Altered mental status, unspecified: Secondary | ICD-10-CM

## 2013-09-08 HISTORY — PX: CHOLECYSTECTOMY: SHX55

## 2013-09-08 LAB — COMPREHENSIVE METABOLIC PANEL
ALK PHOS: 103 U/L (ref 39–117)
ALT: 63 U/L — ABNORMAL HIGH (ref 0–53)
AST: 49 U/L — ABNORMAL HIGH (ref 0–37)
Albumin: 3.3 g/dL — ABNORMAL LOW (ref 3.5–5.2)
BILIRUBIN TOTAL: 0.7 mg/dL (ref 0.3–1.2)
BUN: 9 mg/dL (ref 6–23)
CO2: 21 mEq/L (ref 19–32)
CREATININE: 0.88 mg/dL (ref 0.50–1.35)
Calcium: 9.5 mg/dL (ref 8.4–10.5)
Chloride: 98 mEq/L (ref 96–112)
GFR calc non Af Amer: 81 mL/min — ABNORMAL LOW (ref >90)
GLUCOSE: 104 mg/dL — AB (ref 70–99)
POTASSIUM: 4.2 meq/L (ref 3.7–5.3)
Sodium: 133 mEq/L — ABNORMAL LOW (ref 137–147)
Total Protein: 6.5 g/dL (ref 6.0–8.3)

## 2013-09-08 LAB — CBC
HEMATOCRIT: 34.9 % — AB (ref 39.0–52.0)
Hemoglobin: 12 g/dL — ABNORMAL LOW (ref 13.0–17.0)
MCH: 28.5 pg (ref 26.0–34.0)
MCHC: 34.4 g/dL (ref 30.0–36.0)
MCV: 82.9 fL (ref 78.0–100.0)
PLATELETS: 270 10*3/uL (ref 150–400)
RBC: 4.21 MIL/uL — ABNORMAL LOW (ref 4.22–5.81)
RDW: 12.6 % (ref 11.5–15.5)
WBC: 7.2 10*3/uL (ref 4.0–10.5)

## 2013-09-08 LAB — SURGICAL PCR SCREEN
MRSA, PCR: NEGATIVE
Staphylococcus aureus: NEGATIVE

## 2013-09-08 LAB — PROTIME-INR
INR: 1.2 (ref 0.00–1.49)
Prothrombin Time: 14.9 seconds (ref 11.6–15.2)

## 2013-09-08 SURGERY — LAPAROSCOPIC CHOLECYSTECTOMY WITH INTRAOPERATIVE CHOLANGIOGRAM
Anesthesia: General | Site: Abdomen

## 2013-09-08 MED ORDER — PROPOFOL 10 MG/ML IV BOLUS
INTRAVENOUS | Status: AC
  Filled 2013-09-08: qty 20

## 2013-09-08 MED ORDER — FENTANYL CITRATE 0.05 MG/ML IJ SOLN
INTRAMUSCULAR | Status: DC | PRN
Start: 2013-09-08 — End: 2013-09-08
  Administered 2013-09-08: 50 ug via INTRAVENOUS
  Administered 2013-09-08: 100 ug via INTRAVENOUS
  Administered 2013-09-08 (×2): 50 ug via INTRAVENOUS

## 2013-09-08 MED ORDER — HYDROMORPHONE HCL PF 1 MG/ML IJ SOLN
INTRAMUSCULAR | Status: AC
Start: 2013-09-08 — End: 2013-09-09
  Filled 2013-09-08: qty 1

## 2013-09-08 MED ORDER — GLYCOPYRROLATE 0.2 MG/ML IJ SOLN
INTRAMUSCULAR | Status: DC | PRN
  Administered 2013-09-08: 0.6 mg via INTRAVENOUS

## 2013-09-08 MED ORDER — PROMETHAZINE HCL 25 MG/ML IJ SOLN: 6.2500 mg | INTRAMUSCULAR | Status: DC | PRN

## 2013-09-08 MED ORDER — PROPOFOL 10 MG/ML IV BOLUS
INTRAVENOUS | Status: DC | PRN
  Administered 2013-09-08: 120 mg via INTRAVENOUS

## 2013-09-08 MED ORDER — NEOSTIGMINE METHYLSULFATE 1 MG/ML IJ SOLN
INTRAMUSCULAR | Status: DC | PRN
  Administered 2013-09-08: 4 mg via INTRAVENOUS

## 2013-09-08 MED ORDER — ONDANSETRON HCL 4 MG/2ML IJ SOLN
INTRAMUSCULAR | Status: DC | PRN
  Administered 2013-09-08: 4 mg via INTRAVENOUS

## 2013-09-08 MED ORDER — ROCURONIUM BROMIDE 100 MG/10ML IV SOLN
INTRAVENOUS | Status: DC | PRN
  Administered 2013-09-08: 30 mg via INTRAVENOUS
  Administered 2013-09-08: 10 mg via INTRAVENOUS

## 2013-09-08 MED ORDER — HEMOSTATIC AGENTS (NO CHARGE) OPTIME
TOPICAL | Status: DC | PRN
  Administered 2013-09-08: 1 via TOPICAL

## 2013-09-08 MED ORDER — EPHEDRINE SULFATE 50 MG/ML IJ SOLN
INTRAMUSCULAR | Status: AC
  Filled 2013-09-08: qty 1

## 2013-09-08 MED ORDER — HEPARIN (PORCINE) IN NACL 100-0.45 UNIT/ML-% IJ SOLN
1900.0000 [IU]/h | INTRAMUSCULAR | Status: DC
  Administered 2013-09-09 – 2013-09-10 (×3): 1900 [IU]/h via INTRAVENOUS
  Filled 2013-09-08 (×5): qty 250

## 2013-09-08 MED ORDER — MORPHINE SULFATE 2 MG/ML IJ SOLN
2.0000 mg | INTRAMUSCULAR | Status: DC | PRN
  Administered 2013-09-08: 4 mg via INTRAVENOUS
  Administered 2013-09-08: 2 mg via INTRAVENOUS
  Administered 2013-09-09: 4 mg via INTRAVENOUS
  Filled 2013-09-08: qty 1
  Filled 2013-09-08 (×2): qty 2

## 2013-09-08 MED ORDER — PHENYLEPHRINE HCL 10 MG/ML IJ SOLN
INTRAMUSCULAR | Status: AC
  Filled 2013-09-08: qty 1

## 2013-09-08 MED ORDER — ROCURONIUM BROMIDE 100 MG/10ML IV SOLN
INTRAVENOUS | Status: AC
  Filled 2013-09-08: qty 1

## 2013-09-08 MED ORDER — LACTATED RINGERS IV SOLN: INTRAVENOUS | Status: DC

## 2013-09-08 MED ORDER — LIDOCAINE HCL (CARDIAC) 20 MG/ML IV SOLN
INTRAVENOUS | Status: AC
  Filled 2013-09-08: qty 5

## 2013-09-08 MED ORDER — BUPIVACAINE HCL (PF) 0.5 % IJ SOLN
INTRAMUSCULAR | Status: AC
  Filled 2013-09-08: qty 30

## 2013-09-08 MED ORDER — PIPERACILLIN-TAZOBACTAM 3.375 G IVPB
3.3750 g | Freq: Three times a day (TID) | INTRAVENOUS | Status: AC
  Administered 2013-09-08 – 2013-09-09 (×2): 3.375 g via INTRAVENOUS
  Filled 2013-09-08 (×2): qty 50

## 2013-09-08 MED ORDER — ASPIRIN EC 81 MG PO TBEC: 81.0000 mg | DELAYED_RELEASE_TABLET | Freq: Every day | ORAL | Status: DC

## 2013-09-08 MED ORDER — HYDROMORPHONE HCL PF 1 MG/ML IJ SOLN
INTRAMUSCULAR | Status: AC
  Filled 2013-09-08: qty 1

## 2013-09-08 MED ORDER — SODIUM CHLORIDE 0.9 % IJ SOLN
INTRAMUSCULAR | Status: AC
  Filled 2013-09-08: qty 10

## 2013-09-08 MED ORDER — LACTATED RINGERS IV SOLN
INTRAVENOUS | Status: DC | PRN
  Administered 2013-09-08 (×2): via INTRAVENOUS

## 2013-09-08 MED ORDER — LACTATED RINGERS IV SOLN
INTRAVENOUS | Status: DC
  Administered 2013-09-08: 1000 mL via INTRAVENOUS

## 2013-09-08 MED ORDER — PHENYLEPHRINE HCL 10 MG/ML IJ SOLN
INTRAMUSCULAR | Status: DC | PRN
  Administered 2013-09-08: 20 ug via INTRAVENOUS

## 2013-09-08 MED ORDER — BUPIVACAINE HCL 0.5 % IJ SOLN
INTRAMUSCULAR | Status: DC | PRN
  Administered 2013-09-08: 12 mL
  Administered 2013-09-08: 5 mL

## 2013-09-08 MED ORDER — ONDANSETRON HCL 4 MG/2ML IJ SOLN
INTRAMUSCULAR | Status: AC
  Filled 2013-09-08: qty 2

## 2013-09-08 MED ORDER — IOHEXOL 300 MG/ML  SOLN
INTRAMUSCULAR | Status: DC | PRN
  Administered 2013-09-08: 5 mL via INTRAVENOUS

## 2013-09-08 MED ORDER — HYDROMORPHONE HCL PF 1 MG/ML IJ SOLN
0.2500 mg | INTRAMUSCULAR | Status: DC | PRN
  Administered 2013-09-08 (×3): 0.5 mg via INTRAVENOUS

## 2013-09-08 MED ORDER — LIDOCAINE HCL (CARDIAC) 20 MG/ML IV SOLN
INTRAVENOUS | Status: DC | PRN
  Administered 2013-09-08: 50 mg via INTRAVENOUS

## 2013-09-08 MED ORDER — GLYCOPYRROLATE 0.2 MG/ML IJ SOLN
INTRAMUSCULAR | Status: AC
  Filled 2013-09-08: qty 3

## 2013-09-08 MED ORDER — FENTANYL CITRATE 0.05 MG/ML IJ SOLN
INTRAMUSCULAR | Status: AC
  Filled 2013-09-08: qty 5

## 2013-09-08 MED ORDER — SUCCINYLCHOLINE CHLORIDE 20 MG/ML IJ SOLN
INTRAMUSCULAR | Status: DC | PRN
  Administered 2013-09-08: 140 mg via INTRAVENOUS

## 2013-09-08 SURGICAL SUPPLY — 37 items
APPLIER CLIP 5 13 M/L LIGAMAX5 (MISCELLANEOUS) ×3
BENZOIN TINCTURE PRP APPL 2/3 (GAUZE/BANDAGES/DRESSINGS) ×3 IMPLANT
CHLORAPREP W/TINT 26ML (MISCELLANEOUS) ×3 IMPLANT
CLIP APPLIE 5 13 M/L LIGAMAX5 (MISCELLANEOUS) ×1 IMPLANT
CLOSURE WOUND 1/2 X4 (GAUZE/BANDAGES/DRESSINGS) ×1
COVER MAYO STAND STRL (DRAPES) ×3 IMPLANT
DECANTER SPIKE VIAL GLASS SM (MISCELLANEOUS) ×3 IMPLANT
DRAPE C-ARM 42X120 X-RAY (DRAPES) ×3 IMPLANT
DRAPE LAPAROSCOPIC ABDOMINAL (DRAPES) ×3 IMPLANT
DRAPE UTILITY XL STRL (DRAPES) ×3 IMPLANT
DRSG TEGADERM 2-3/8X2-3/4 SM (GAUZE/BANDAGES/DRESSINGS) ×9 IMPLANT
ELECT REM PT RETURN 9FT ADLT (ELECTROSURGICAL) ×3
ELECTRODE REM PT RTRN 9FT ADLT (ELECTROSURGICAL) ×1 IMPLANT
ENDOLOOP SUT PDS II  0 18 (SUTURE)
ENDOLOOP SUT PDS II 0 18 (SUTURE) IMPLANT
GAUZE SPONGE 2X2 8PLY STRL LF (GAUZE/BANDAGES/DRESSINGS) ×1 IMPLANT
GLOVE ECLIPSE 8.0 STRL XLNG CF (GLOVE) ×3 IMPLANT
GLOVE INDICATOR 8.0 STRL GRN (GLOVE) ×3 IMPLANT
GOWN STRL REUS W/TWL XL LVL3 (GOWN DISPOSABLE) ×9 IMPLANT
HEMOSTAT SNOW SURGICEL 2X4 (HEMOSTASIS) ×3 IMPLANT
KIT BASIN OR (CUSTOM PROCEDURE TRAY) ×3 IMPLANT
POUCH SPECIMEN RETRIEVAL 10MM (ENDOMECHANICALS) ×3 IMPLANT
SCISSORS LAP 5X35 DISP (ENDOMECHANICALS) ×3 IMPLANT
SET CHOLANGIOGRAPH MIX (MISCELLANEOUS) ×3 IMPLANT
SET IRRIG TUBING LAPAROSCOPIC (IRRIGATION / IRRIGATOR) ×3 IMPLANT
SLEEVE XCEL OPT CAN 5 100 (ENDOMECHANICALS) ×6 IMPLANT
SOLUTION ANTI FOG 6CC (MISCELLANEOUS) ×3 IMPLANT
SPONGE GAUZE 2X2 STER 10/PKG (GAUZE/BANDAGES/DRESSINGS) ×2
STRIP CLOSURE SKIN 1/2X4 (GAUZE/BANDAGES/DRESSINGS) ×2 IMPLANT
SUT MNCRL AB 4-0 PS2 18 (SUTURE) ×3 IMPLANT
TOWEL OR 17X26 10 PK STRL BLUE (TOWEL DISPOSABLE) ×3 IMPLANT
TOWEL OR NON WOVEN STRL DISP B (DISPOSABLE) ×3 IMPLANT
TRAY LAP CHOLE (CUSTOM PROCEDURE TRAY) ×3 IMPLANT
TROCAR BLADELESS OPT 5 100 (ENDOMECHANICALS) ×9 IMPLANT
TROCAR XCEL BLUNT TIP 100MML (ENDOMECHANICALS) ×3 IMPLANT
TROCAR XCEL NON-BLD 11X100MML (ENDOMECHANICALS) IMPLANT
TUBING INSUFFLATION 10FT LAP (TUBING) ×3 IMPLANT

## 2013-09-08 NOTE — Op Note (Signed)
Operative Note  [name] male 80 y.o. 09/08/2013  PREOPERATIVE DX:  Chronic acalculous cholecystitis  POSTOPERATIVE DX:  Chronic calculus cholecystitis  PROCEDURE:  Laparoscopic cholecystectomy with cholangiogram         Surgeon: Odis Hollingshead   Assistants: None  Anesthesia: General endotracheal anesthesia  Indications: This is a 80 year old male admitted a week ago with bilateral quadrant pain of increasing frequency. This was associated with nausea.  He was found to have x-ray findings consistent with a acalculous cholecystitis of a chronic nature. His pain improved in the hospital. He underwent a nuclear medicine stress cardiac test that was positive. Subsequently he underwent cardiac catheterization yesterday which didn't show any disease significant enough to warrant intervention. Thus he was felt to be a mild to moderate risk for surgery. He presents for cholecystectomy.    Procedure Detail: He was brought to the operating room placed supine on the operating table and a general anesthetic was given. The hair in the abdominal wall was clipped.  The abdominal wall was sterilely prepped and draped.  Marcaine was infiltrated in the subumbilical region. A subumbilical incision was made through skin and subcutaneous tissue. Midline fascia was identified. An incision was made in the midline fascia. The peritoneal cavity was entered under direct vision. A pursestring suture of 0 Vicryl was placed on the edges of the fascia. A Hassan trocar is introduced to the peritoneal cavity and a pneumoperitoneum was created by insufflation of carbon dioxide gas. The laparoscope was introduced into the trocar and there is no underlying bleeding or organ injury.  A 5 mm trocar was placed in the epigastrium through her previous small scar. A 5 mm trocar is placed laterally the right upper quadrant. A 5 mm trocar was placed centrally in the right upper quadrant. The gallbladder was visualized and  dense chronic inflammatory changes were noted. There were dense adhesions between the omentum and the gallbladder which were separated using electrocautery and sharply. The fundus of the gallbladder was grasped and retracted up toward the right shoulder. The infundibulum was grasped and was mobilized. There is some thick chronic inflammatory tissue around the gallbladder and was dissected free from the gallbladder.  The cystic duct was identified and a window was created around it. A clip was placed at the cystic duct gallbladder junction. A small incision was made in the cystic duct. A cholangiocatheter was inserted into the cystic duct and Drains performed.  Under real-time fluoroscopy dilute contrast was injected into the cystic duct. The common hepatic, right and left hepatic, and common bile ducts all filled well. Contrast drained into the to the duodenum without obvious evidence of obstruction. Final report is pending the radiologist interpretation.  The cholangiocatheter was removed, the cystic duct was clipped 3 times and the biliary side and divided. Using blunt dissection close to the gallbladder the cystic artery was identified and a window created around it. It was clipped and divided. Using electrocautery began dissecting the gallbladder free from the liver. The majority of the gallbladder was intrahepatic. The gallbladder wall was very thick. At one point in time the there was a hole made in the gallbladder and a small stones spilled out. This was removed. I continued to dissect the posterior wall the gallbladder free from the liver at times taking pieces of liver with it in order to ensure that the entire wall of the gallbladder was removed. Subsequently the gallbladder was dissected free from the liver and placed in an Endopouch bag. The  bag was then removed through the [facility] trocar incision. Copious irrigation of the gallbladder fossa was performed and bleeding was controlled with  electrocautery. A piece of Surgicel was placed in the gallbladder fossa. Following this a 78 Blake drain was then placed into the abdominal cavity and positioned in the gallbladder fossa. It was brought out the lateral 5 mm trocar in the right upper quadrant and secured to the skin with nylon suture. The subumbilical trocar was removed. The fascial defect in this incision was closed under laparoscopic vision by tying down the purse string suture. The remaining trocars were removed and the pneumoperitoneum was released.  He tolerated the procedure well without any apparent complications and was taken to the recovery room in satisfactory condition. Because of the extensive liver dissection in order to get the gallbladder out I left a drain in. Also, will need to delay the start of his heparin until tomorrow morning because of increased risk of bleeding.  Estimated Blood Loss:  200 mL         Drains: #19 round Blake drain  Blood Given: none          Specimens: Gallbladder        Complications:  * No complications entered in OR log *         Disposition: PACU - hemodynamically stable.         Condition: stable

## 2013-09-08 NOTE — Progress Notes (Signed)
[facility] PROGRESS NOTE  [name] YWV:[id number] DOB: [birth year] DOA: 09/01/2013 PCP: Gennette Pac, MD  Interim history 80 y.o. 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 It has worsened 3 days prior to admission. Workup revealed a calculus cholecystitis. The patient had abnormal troponins at the time of admission. Cardiology was consulted. Myoview stress test was abnormal with inducible ischemia. Heart catheterization was performed on 09/07/2013, and it revealed only a very small area of potential ischemia. The patient was cleared by cardiology for cholecystectomy which was performed on 09/08/2013. The patient has been on intravenous heparin while he has been off of Coumadin for his mechanical aortic valve and permanent atrial fibrillation. Because of risk of bleeding from extensive dissection from the cholecystectomy, restart heparin was recommended to be delayed until 09/09/2013. The patient was treated with intravenous Zosyn throughout the hospitalization with clinical improvement.   Assessment/Plan: Chronic calculus cholecystitis  -OKAY to proceed with surgery per Cardiology--noted cath results -09/08/13--Lap chole by Dr. Zella Richer  -patient also has history of RCC s/p two radiofrequency ablations.  - 09/01/13 CT abd/pelvis--gallbladder wall thickening with pericholecystic fluid and trace biliary ductal dilatation  - General surgery consultation given suspicion of chronic cholecystitis  -HIDA--patent cystic duct, chronic cholecystitis  - continued zosyn-->clinically improved with abx  -appreciate surgery  -plan to d/c zosyn 09/09/13 CAD with mild elevation of troponin/abnormal nuclear stress test  -Heart catheterization  09/07/2013--widely patent grafts, potential ischemia to the third obtuse marginal territory  -myoview--reveals inducible ischemia, EF 65%  -s/p CABG/AVR,  - ASA, statin, BB  -Appreciate Cardiology following  -restart demadex if okay with cardiology  Permanent atrial fibrillation/Mechanical AVR  -on A/C and rate controlled on BB and dig  - Restart heparin drip 09/09/13 and coumadin 09/09/13 -will need LMWH bridge if goes home due to high risk of AVR thrombosis -Echo EF 55-60%, well function AV prosthesis  Progressive generalized weakness  -overall improving during hospitalization  -No focal abnormalities  - Multifactorial including deconditioning as well as his infectious process and other electrolyte arrangements  - IVF  - PT-recommended home health PT  - TSH--0.875  - Cortisol 14.1  Mechanical AVR  -IV heparin restart 09/09/13 GERD,  -possible acid-reflux like symptoms  -Continue PPI  Leukocytosis  -Due to acute medical condition  -Improved  -Afebrile and hemodynamically stable  Family Communication: wife and daughter at beside  Disposition Plan: Home when cleared by surgery Antibiotics:  Zosyn 09/02/13>>>          Procedures/Studies: Dg Chest 2 View  09/01/2013   CLINICAL DATA:  Weakness. Atrial fibrillation. High blood pressure. Renal cell carcinoma. Prior smoker.  EXAM: CHEST  2 VIEW  COMPARISON:  06/16/2012.  FINDINGS: Post median sternotomy with fracture of upper sternal wires. Post CABG. Heart size top-normal. Calcified mildly tortuous aorta.  T12 compression fracture with prior cement augmentation unchanged. No new compression fracture detected.  No infiltrate, congestive heart failure or pneumothorax.  No plain film evidence of pulmonary malignancy/metastatic disease.  IMPRESSION: Post median sternotomy with fracture of upper sternal wires. Post CABG. Heart size top-normal.  Calcified mildly tortuous aorta.  T12 compression fracture with prior cement augmentation  unchanged. No new compression fracture detected.  No infiltrate, congestive heart failure or pneumothorax.  No plain film evidence of  pulmonary malignancy/metastatic disease.   Electronically Signed   By: Chauncey Cruel M.D.   On: 09/01/2013 12:22   Dg Cholangiogram Operative  09/08/2013   CLINICAL DATA:  Cholecystitis  EXAM: INTRAOPERATIVE CHOLANGIOGRAM  TECHNIQUE: Cholangiographic images from the C-arm fluoroscopic device were submitted for interpretation post-operatively. Please see the procedural report for the amount of contrast and the fluoroscopy time utilized.  COMPARISON:  None.  FINDINGS: No persistent filling defects in the common duct. Intrahepatic ducts are incompletely visualized, appearing decompressed centrally. Contrast passes into the duodenum.  : Negative for retained common duct stone.   Electronically Signed   By: Arne Cleveland M.D.   On: 09/08/2013 14:34   Nm Hepatobiliary Liver Func  09/03/2013   CLINICAL DATA:  Right upper quadrant pain  EXAM: NUCLEAR MEDICINE HEPATOBILIARY IMAGING  TECHNIQUE: Sequential images of the abdomen were obtained out to 60 minutes following intravenous administration of radiopharmaceutical.  COMPARISON:  None.  RADIOPHARMACEUTICALS:  5.62mCi Tc-15m Choletec  FINDINGS: Following the intravenous administration of the radiopharmaceutical there is uniform tracer uptake with clearance from blood pool. Tracer activity within the common bile duct and small bowel loops noted by 15 min. After 60 min no activity is noted within the gallbladder. The patient was given 4 mg of morphine sulfate, IV and imaging was carried out for an additional 30 min. 15 min after administering morphine radiotracer began accumulating within the gallbladder.  IMPRESSION: 1. Patent cystic duct without evidence for acute cholecystitis. 2. Delayed gallbladder filling compatible with chronic cholecystitis.   Electronically Signed   By: Kerby Moors M.D.   On: 09/03/2013 13:11   US Abdomen  Complete  09/01/2013   CLINICAL DATA:  Right upper quadrant pain  EXAM: ULTRASOUND ABDOMEN COMPLETE  COMPARISON:  None.  FINDINGS: Gallbladder:  There is mild gallbladder wall thickening to 6 mm. No pericholecystic fluid. There is dependent sludge within the gallbladder. Negative sonographic Murphy's sign.  Common bile duct:  Diameter: Normal diameter 4 mm.  Liver:  No focal lesion identified. Within normal limits in parenchymal echogenicity.  IVC:  No abnormality visualized.  Pancreas:  Visualized portion unremarkable.  Spleen:  Size and appearance within normal limits.  Right Kidney: 12.4cm in length. No evidence of hydronephrosis or stones. Anechoic 1.5 cm cyst in the upper pole. This is proteinaceous nonenhancing cyst on comparison CT.  Left Kidney:  Length: 12.7. Echogenicity within normal limits. No mass or hydronephrosis visualized.  Abdominal aorta:  No aneurysm visualized.  IMPRESSION:  Gallbladder sludge and gallbladder wall thickening without evidence of acute cholecystitis. The findings could represent chronic cholecystitis.   Electronically Signed   By: Suzy Bouchard M.D.   On: 09/01/2013 12:06   Ct Abdomen Pelvis W Contrast  09/01/2013   CLINICAL DATA:  Mid abdominal discomfort and generalized weakness, history of renal cell malignancy and radiofrequency ablation.  EXAM: CT ABDOMEN AND PELVIS WITH CONTRAST  TECHNIQUE: Multidetector CT imaging of the abdomen and pelvis was performed using the standard protocol following bolus administration of intravenous contrast.  CONTRAST:  131mL OMNIPAQUE IOHEXOL 300 MG/ML  SOLN  COMPARISON:  CT scan of the abdomen dated June 16, 2013.  FINDINGS: The gallbladder is abnormal in appearance. It is mildly distended and exhibits wall thickening and pericholecystic fluid. No calcified or noncalcified stones are demonstrated. The adjacent liver is normal in density and contour. There may be a trace of intrahepatic ductal dilation. The pancreas is somewhat atrophic  and exhibits no evidence of inflammatory change or focal mass  or ductal dilation. The stomach is moderately distended with food particles. The spleen is not enlarged. There are no adrenal masses  The kidneys exhibit no evidence of obstruction. There are findings consistent with the previous radiofrequency ablation on the right. There is a small hypodense focus medial to the lower pole of the right kidney which is stable. It is demonstrated on image 52 of series 2. It measures 1.6 by a approximately 1 cm. A 2nd cortically based midpole hypodensity on the right is stable and is most compatible with a cyst. There is a stable 1.5 cm diameter hypodensity in the lateral aspect of the mid to upper pole of the right kidney demonstrated best on image 41 of series 2. In the medial aspect of the mid to upper pole on image 42 of series 2 there is a 1 cm diameter stable appearing hypodensity. On the left there is no evidence of obstruction or parenchymal masses. A subcentimeter hypodensity in the midpole laterally is consistent with a cyst.  On delayed images contrast within the renal collecting systems is within the limits of normal.  The caliber of the abdominal aorta is normal. The periaortic and pericaval regions also appear normal. The partially contrast filled loops of small and large bowel exhibit no evidence of ileus nor of obstruction. A normal calibered, partially contrast-filled, uninflamed-appearing appendix is demonstrated. Within the pelvis the urinary bladder is mildly distended. The prostate gland is mildly enlarged and produces an impression upon the urinary bladder base. The seminal vesicles and prostate gland appear normal. There is a small fat containing left inguinal hernia. There is a tiny fat containing umbilical hernia.  The right lung base demonstrates very mild subsegmental atelectasis posteriorly. The lumbar vertebral bodies are preserved in height. There is degenerative disc change at multiple lumbar  levels. There is partial compression of the body of T12 which appears to have been previously treated with kyphoplasty.  IMPRESSION: 1. The appearance of the gallbladder is abnormal suggesting acalculous cholecystitis. Ultrasound of the gallbladder would be useful. 2. The appearance of both kidneys is stable with no evidence of obstruction. Post ablation changes on the right are present and stable. There are stable hypodensities remaining in the renal cortex of the right kidney. 3. No acute bowel abnormality is demonstrated. 4. There is mild distention of the urinary bladder which may be related to the mildly enlarged prostate gland. Foley catheterization would be useful.   Electronically Signed   By: Aaima Gaddie  [place]   On: 09/01/2013 19:08   Nm Myocar Multi W/spect W/wall Motion / Ef  09/05/2013   CLINICAL DATA:  Cholecystitis and history of coronary artery disease with prior CABG. Mildly elevated troponin level.  EXAM: MYOCARDIAL IMAGING WITH SPECT (REST AND PHARMACOLOGIC-STRESS)  GATED LEFT VENTRICULAR WALL MOTION STUDY  LEFT VENTRICULAR EJECTION FRACTION  TECHNIQUE: Standard myocardial SPECT imaging was performed after resting intravenous injection of 10 mCi Tc-28m sestamibi. Subsequently, intravenous infusion of Lexiscan was performed under the supervision of the Cardiology staff. At peak effect of the drug, 30 mCi Tc-57m sestamibi was injected intravenously and standard myocardial SPECT imaging was performed. Quantitative gated imaging was also performed to evaluate left ventricular wall motion, and estimate left ventricular ejection fraction.  COMPARISON:  None.  FINDINGS: Utilizing gated data, the end-diastolic volume is estimated to be 125 mL and the end systolic volume 44 mL. Calculated ejection fraction is 65%.  Gated wall motion analysis demonstrates mild septal hypokinesis which is likely secondary to prior CABG. No other  wall motion abnormalities are identified.  SPECT imaging shows reversibility at  the level of the mid to basal septum extending into the inferoseptal wall and consistent with inducible ischemia. No other fixed or reversible perfusion defects are identified.  IMPRESSION: 1. Reversibility demonstrated at the level of the mid to basal septum and adjacent inferoseptal wall. Findings are consistent with inducible ischemia. No other perfusion defects are identified. 2. Normal left ventricular function with quantitative ejection fraction calculation of 65%. Mild septal hypokinesis can be attributed to prior CABG.   Electronically Signed   By: Aletta Edouard M.D.   On: 09/05/2013 13:40         Subjective: Patient complains of pain at surgical site. Denies any fevers, chills, chest pain, shortness breath, nausea, vomiting, diarrhea. No headaches or dizziness.  Objective: Filed Vitals:   09/08/13 1500 09/08/13 1515 09/08/13 1530 09/08/13 1604  BP: 169/69 178/61 178/74 179/67  Pulse: 56 58 63 62  Temp:   98.1 F (36.7 C) 97.9 F (36.6 C)  TempSrc:      Resp: 18 18 18 14   Height:      Weight:      SpO2: 100% 100% 100% 98%    Intake/Output Summary (Last 24 hours) at 09/08/13 1743 Last data filed at 09/08/13 1530  Gross per 24 hour  Intake 2594.32 ml  Output   1055 ml  Net 1539.32 ml   Weight change:  Exam:   General:  Pt is alert, follows commands appropriately, not in acute distress  HEENT: No icterus, No thrush,  [place]/AT  Cardiovascular: RRR, S1/S2, no rubs, no gallops  Respiratory: CTA bilaterally, no wheezing, no crackles, no rhonchi  Abdomen: Soft/+BS, generalized abdominal pain without peritoneal signs, non distended, no guarding  Extremities: 1+ LE edema, No lymphangitis, No petechiae, No rashes, no synovitis  Data Reviewed: Basic Metabolic Panel:  Recent Labs Lab 09/04/13 0500 09/05/13 0530 09/06/13 0522 09/07/13 0527 09/08/13 0524  NA 135* 137 135* 134* 133*  K 4.1 4.2 4.1 3.9 4.2  CL 97 100 99 98 98  CO2 26 26 25 22 21   GLUCOSE 117* 112*  122* 116* 104*  BUN 13 9 9 7 9   CREATININE 0.91 0.90 1.03 0.86 0.88  CALCIUM 9.5 9.4 9.3 9.5 9.5  MG  --   --   --  1.9  --    Liver Function Tests:  Recent Labs Lab 09/04/13 0500 09/05/13 0530 09/06/13 0522 09/07/13 0527 09/08/13 0524  AST 62* 47* 33 33 49*  ALT 77* 73* 64* 63* 63*  ALKPHOS 114 109 102 104 103  BILITOT 0.5 0.4 0.4 0.5 0.7  PROT 6.1 6.0 6.1 6.5 6.5  ALBUMIN 2.9* 2.9* 3.0* 3.2* 3.3*   No results found for this basename: LIPASE, AMYLASE,  in the last 168 hours No results found for this basename: AMMONIA,  in the last 168 hours CBC:  Recent Labs Lab 09/04/13 0500 09/05/13 0530 09/06/13 0522 09/07/13 0527 09/08/13 0524  WBC 6.9 6.9 6.8 6.4 7.2  HGB 11.6* 11.7* 11.3* 11.9* 12.0*  HCT 32.7* 34.2* 32.7* 35.0* 34.9*  MCV 82.2 83.6 82.6 83.1 82.9  PLT 182 187 241 257 270   Cardiac Enzymes:  Recent Labs Lab 09/01/13 1805 09/01/13 2027  TROPONINI 0.49* 0.44*   BNP: No components found with this basename: POCBNP,  CBG:  Recent Labs Lab 09/02/13 2157 09/03/13 0736  GLUCAP 134* 110*    Recent Results (from the past 240 hour(s))  SURGICAL PCR SCREEN  Status: None   Collection Time    09/08/13  6:10 AM      Result Value Range Status   MRSA, PCR NEGATIVE  NEGATIVE Final   Staphylococcus aureus NEGATIVE  NEGATIVE Final   Comment:            The Xpert SA Assay (FDA     approved for NASAL specimens     in patients over 78 years of age),     is one component of     a comprehensive surveillance     program.  Test performance has     been validated by [employer] for patients greater     than or equal to 22 year old.     It is not intended     to diagnose infection nor to     guide or monitor treatment.     Scheduled Meds: . [START ON 09/11/2013] aspirin EC  81 mg Oral Daily  . atorvastatin  40 mg Oral Daily  . carvedilol  25 mg Oral BID WC  . darifenacin  15 mg Oral Daily  . digoxin  0.125 mg Oral Daily  . docusate sodium  100 mg  Oral BID  . HYDROmorphone      . HYDROmorphone      . multivitamin with minerals  1 tablet Oral Daily  . pantoprazole (PROTONIX) IV  40 mg Intravenous Q12H  . piperacillin-tazobactam (ZOSYN)  IV  3.375 g Intravenous Q8H  . potassium chloride  10 mEq Oral Daily  . senna  1 tablet Oral BID  . tamsulosin  0.4 mg Oral Daily  . vitamin B-12  1,000 mcg Oral Daily   Continuous Infusions: . sodium chloride 50 mL/hr at 09/08/13 1621  . [START ON 09/09/2013] heparin       Sharia Averitt, DO  [facility] Pager [phone]  If 7PM-7AM, please contact night-coverage [email or web address] Password [email or web address] 09/08/2013, 5:43 PM   LOS: 7 days

## 2013-09-08 NOTE — Transfer of Care (Signed)
Immediate Anesthesia Transfer of Care Note  Patient: [name]  Procedure(s) Performed: Procedure(s): LAPAROSCOPIC CHOLECYSTECTOMY WITH INTRAOPERATIVE CHOLANGIOGRAM (N/A)  Patient Location: PACU  Anesthesia Type:General  Level of Consciousness: awake, alert  and oriented  Airway & Oxygen Therapy: Patient Spontanous Breathing and Patient connected to face mask oxygen  Post-op Assessment: Report given to PACU RN, Post -op Vital signs reviewed and stable and Patient moving all extremities X 4  Post vital signs: Reviewed and stable  Complications: No apparent anesthesia complications

## 2013-09-08 NOTE — Progress Notes (Addendum)
SUBJECTIVE:  No chest discomfort. No bleeding post catheterization.  OBJECTIVE:   Vitals:   Filed Vitals:   09/08/13 1454 09/08/13 1500 09/08/13 1515 09/08/13 1530  BP:  169/69 178/61 178/74  Pulse: 53 56 58 63  Temp:    98.1 F (36.7 C)  TempSrc:      Resp: 19 18 18 18   Height:      Weight:      SpO2: 100% 100% 100% 100%   I&O's:   Intake/Output Summary (Last 24 hours) at 09/08/13 1559 Last data filed at 09/08/13 1530  Gross per 24 hour  Intake 2834.32 ml  Output   1055 ml  Net 1779.32 ml   TELEMETRY: Reviewed telemetry pt in atrial fibrillation:     PHYSICAL EXAM General: Well developed, well nourished, in no acute distress Head:   Normal cephalic and atramatic  Lungs: Clear bilaterally to auscultation and percussion. Heart:   HRRR S1 S2Abdomen: Bowel sounds are positive, abdomen soft and non-tender without masses or                  Hernia's noted.  Extremities:  No hematoma. No edema.  2+ right PT pulse Neuro: Alert and oriented X 3. Psych:  Normal affect, responds appropriately   LABS: Basic Metabolic Panel:  Recent Labs  09/07/13 0527 09/08/13 0524  NA 134* 133*  K 3.9 4.2  CL 98 98  CO2 22 21  GLUCOSE 116* 104*  BUN 7 9  CREATININE 0.86 0.88  CALCIUM 9.5 9.5  MG 1.9  --    Liver Function Tests:  Recent Labs  09/07/13 0527 09/08/13 0524  AST 33 49*  ALT 63* 63*  ALKPHOS 104 103  BILITOT 0.5 0.7  PROT 6.5 6.5  ALBUMIN 3.2* 3.3*   No results found for this basename: LIPASE, AMYLASE,  in the last 72 hours CBC:  Recent Labs  09/07/13 0527 09/08/13 0524  WBC 6.4 7.2  HGB 11.9* 12.0*  HCT 35.0* 34.9*  MCV 83.1 82.9  PLT 257 270   Cardiac Enzymes: No results found for this basename: CKTOTAL, CKMB, CKMBINDEX, TROPONINI,  in the last 72 hours BNP: No components found with this basename: POCBNP,  D-Dimer: No results found for this basename: DDIMER,  in the last 72 hours Hemoglobin A1C: No results found for this basename: HGBA1C,   in the last 72 hours Fasting Lipid Panel: No results found for this basename: CHOL, HDL, LDLCALC, TRIG, CHOLHDL, LDLDIRECT,  in the last 72 hours Thyroid Function Tests: No results found for this basename: TSH, T4TOTAL, FREET3, T3FREE, THYROIDAB,  in the last 72 hours Anemia Panel: No results found for this basename: VITAMINB12, FOLATE, FERRITIN, TIBC, IRON, RETICCTPCT,  in the last 72 hours Coag Panel:   Lab Results  Component Value Date   INR 1.20 09/08/2013   INR 1.23 09/07/2013   INR 1.31 09/06/2013   PROTIME 21.1 01/27/2009    RADIOLOGY: Dg Chest 2 View  09/01/2013   CLINICAL DATA:  Weakness. Atrial fibrillation. High blood pressure. Renal cell carcinoma. Prior smoker.  EXAM: CHEST  2 VIEW  COMPARISON:  06/16/2012.  FINDINGS: Post median sternotomy with fracture of upper sternal wires. Post CABG. Heart size top-normal. Calcified mildly tortuous aorta.  T12 compression fracture with prior cement augmentation unchanged. No new compression fracture detected.  No infiltrate, congestive heart failure or pneumothorax.  No plain film evidence of pulmonary malignancy/metastatic disease.  IMPRESSION: Post median sternotomy with fracture of upper sternal wires. Post CABG.  Heart size top-normal.  Calcified mildly tortuous aorta.  T12 compression fracture with prior cement augmentation unchanged. No new compression fracture detected.  No infiltrate, congestive heart failure or pneumothorax.  No plain film evidence of pulmonary malignancy/metastatic disease.   Electronically Signed   By: Chauncey Cruel M.D.   On: 09/01/2013 12:22   Dg Cholangiogram Operative  09/08/2013   CLINICAL DATA:  Cholecystitis  EXAM: INTRAOPERATIVE CHOLANGIOGRAM  TECHNIQUE: Cholangiographic images from the C-arm fluoroscopic device were submitted for interpretation post-operatively. Please see the procedural report for the amount of contrast and the fluoroscopy time utilized.  COMPARISON:  None.  FINDINGS: No persistent filling defects  in the common duct. Intrahepatic ducts are incompletely visualized, appearing decompressed centrally. Contrast passes into the duodenum.  : Negative for retained common duct stone.   Electronically Signed   By: Arne Cleveland M.D.   On: 09/08/2013 14:34   Nm Hepatobiliary Liver Func  09/03/2013   CLINICAL DATA:  Right upper quadrant pain  EXAM: NUCLEAR MEDICINE HEPATOBILIARY IMAGING  TECHNIQUE: Sequential images of the abdomen were obtained out to 60 minutes following intravenous administration of radiopharmaceutical.  COMPARISON:  None.  RADIOPHARMACEUTICALS:  5.13mCi Tc-73m Choletec  FINDINGS: Following the intravenous administration of the radiopharmaceutical there is uniform tracer uptake with clearance from blood pool. Tracer activity within the common bile duct and small bowel loops noted by 15 min. After 60 min no activity is noted within the gallbladder. The patient was given 4 mg of morphine sulfate, IV and imaging was carried out for an additional 30 min. 15 min after administering morphine radiotracer began accumulating within the gallbladder.  IMPRESSION: 1. Patent cystic duct without evidence for acute cholecystitis. 2. Delayed gallbladder filling compatible with chronic cholecystitis.   Electronically Signed   By: Kerby Moors M.D.   On: 09/03/2013 13:11   US Abdomen Complete  09/01/2013   CLINICAL DATA:  Right upper quadrant pain  EXAM: ULTRASOUND ABDOMEN COMPLETE  COMPARISON:  None.  FINDINGS: Gallbladder:  There is mild gallbladder wall thickening to 6 mm. No pericholecystic fluid. There is dependent sludge within the gallbladder. Negative sonographic Murphy's sign.  Common bile duct:  Diameter: Normal diameter 4 mm.  Liver:  No focal lesion identified. Within normal limits in parenchymal echogenicity.  IVC:  No abnormality visualized.  Pancreas:  Visualized portion unremarkable.  Spleen:  Size and appearance within normal limits.  Right Kidney: 12.4cm in length. No evidence of hydronephrosis  or stones. Anechoic 1.5 cm cyst in the upper pole. This is proteinaceous nonenhancing cyst on comparison CT.  Left Kidney:  Length: 12.7. Echogenicity within normal limits. No mass or hydronephrosis visualized.  Abdominal aorta:  No aneurysm visualized.  IMPRESSION:  Gallbladder sludge and gallbladder wall thickening without evidence of acute cholecystitis. The findings could represent chronic cholecystitis.   Electronically Signed   By: Suzy Bouchard M.D.   On: 09/01/2013 12:06   Ct Abdomen Pelvis W Contrast  09/01/2013   CLINICAL DATA:  Mid abdominal discomfort and generalized weakness, history of renal cell malignancy and radiofrequency ablation.  EXAM: CT ABDOMEN AND PELVIS WITH CONTRAST  TECHNIQUE: Multidetector CT imaging of the abdomen and pelvis was performed using the standard protocol following bolus administration of intravenous contrast.  CONTRAST:  153mL OMNIPAQUE IOHEXOL 300 MG/ML  SOLN  COMPARISON:  CT scan of the abdomen dated June 16, 2013.  FINDINGS: The gallbladder is abnormal in appearance. It is mildly distended and exhibits wall thickening and pericholecystic fluid. No calcified or noncalcified  stones are demonstrated. The adjacent liver is normal in density and contour. There may be a trace of intrahepatic ductal dilation. The pancreas is somewhat atrophic and exhibits no evidence of inflammatory change or focal mass or ductal dilation. The stomach is moderately distended with food particles. The spleen is not enlarged. There are no adrenal masses  The kidneys exhibit no evidence of obstruction. There are findings consistent with the previous radiofrequency ablation on the right. There is a small hypodense focus medial to the lower pole of the right kidney which is stable. It is demonstrated on image 52 of series 2. It measures 1.6 by a approximately 1 cm. A 2nd cortically based midpole hypodensity on the right is stable and is most compatible with a cyst. There is a stable 1.5 cm  diameter hypodensity in the lateral aspect of the mid to upper pole of the right kidney demonstrated best on image 41 of series 2. In the medial aspect of the mid to upper pole on image 42 of series 2 there is a 1 cm diameter stable appearing hypodensity. On the left there is no evidence of obstruction or parenchymal masses. A subcentimeter hypodensity in the midpole laterally is consistent with a cyst.  On delayed images contrast within the renal collecting systems is within the limits of normal.  The caliber of the abdominal aorta is normal. The periaortic and pericaval regions also appear normal. The partially contrast filled loops of small and large bowel exhibit no evidence of ileus nor of obstruction. A normal calibered, partially contrast-filled, uninflamed-appearing appendix is demonstrated. Within the pelvis the urinary bladder is mildly distended. The prostate gland is mildly enlarged and produces an impression upon the urinary bladder base. The seminal vesicles and prostate gland appear normal. There is a small fat containing left inguinal hernia. There is a tiny fat containing umbilical hernia.  The right lung base demonstrates very mild subsegmental atelectasis posteriorly. The lumbar vertebral bodies are preserved in height. There is degenerative disc change at multiple lumbar levels. There is partial compression of the body of T12 which appears to have been previously treated with kyphoplasty.  IMPRESSION: 1. The appearance of the gallbladder is abnormal suggesting acalculous cholecystitis. Ultrasound of the gallbladder would be useful. 2. The appearance of both kidneys is stable with no evidence of obstruction. Post ablation changes on the right are present and stable. There are stable hypodensities remaining in the renal cortex of the right kidney. 3. No acute bowel abnormality is demonstrated. 4. There is mild distention of the urinary bladder which may be related to the mildly enlarged prostate  gland. Foley catheterization would be useful.   Electronically Signed   By: David  Martinique   On: 09/01/2013 19:08   Nm Myocar Multi W/spect W/wall Motion / Ef  09/05/2013   CLINICAL DATA:  Cholecystitis and history of coronary artery disease with prior CABG. Mildly elevated troponin level.  EXAM: MYOCARDIAL IMAGING WITH SPECT (REST AND PHARMACOLOGIC-STRESS)  GATED LEFT VENTRICULAR WALL MOTION STUDY  LEFT VENTRICULAR EJECTION FRACTION  TECHNIQUE: Standard myocardial SPECT imaging was performed after resting intravenous injection of 10 mCi Tc-82m sestamibi. Subsequently, intravenous infusion of Lexiscan was performed under the supervision of the Cardiology staff. At peak effect of the drug, 30 mCi Tc-54m sestamibi was injected intravenously and standard myocardial SPECT imaging was performed. Quantitative gated imaging was also performed to evaluate left ventricular wall motion, and estimate left ventricular ejection fraction.  COMPARISON:  None.  FINDINGS: Utilizing gated data, the end-diastolic  volume is estimated to be 125 mL and the end systolic volume 44 mL. Calculated ejection fraction is 65%.  Gated wall motion analysis demonstrates mild septal hypokinesis which is likely secondary to prior CABG. No other wall motion abnormalities are identified.  SPECT imaging shows reversibility at the level of the mid to basal septum extending into the inferoseptal wall and consistent with inducible ischemia. No other fixed or reversible perfusion defects are identified.  IMPRESSION: 1. Reversibility demonstrated at the level of the mid to basal septum and adjacent inferoseptal wall. Findings are consistent with inducible ischemia. No other perfusion defects are identified. 2. Normal left ventricular function with quantitative ejection fraction calculation of 65%. Mild septal hypokinesis can be attributed to prior CABG.   Electronically Signed   By: Aletta Edouard M.D.   On: 09/05/2013 13:40      ASSESSMENT/PLAN:     CAD: Small area of potential ischemia based on cath yesterday. No further cardiac testing needed before surgery. No apparent complications from cardiac cath done yesterday. We'll sign off at this point.  Please call if there are any further questions.  Jettie Booze., MD  09/08/2013  3:59 PM

## 2013-09-08 NOTE — Progress Notes (Signed)
OT Cancellation Note  Patient Details Name: NAKAI POLLIO MRN: [id number] DOB: 01/29/1934   Cancelled Treatment:    Reason Eval/Treat Not Completed: Other (comment)  Noted cholecystectomy planned today.  Will check back.   Khiem Gargis 09/08/2013, 10:40 AM Lesle Chris, OTR/L [phone] 09/08/2013

## 2013-09-08 NOTE — Progress Notes (Signed)
1 Day Post-Op  Subjective: He is OK after heart cath, no pain, has not walked since cath.  Cath site dressing in place, but looks fine.  He is agreeable to surgery later today.  Objective: Vital signs in last 24 hours: Temp:  [97.4 F (36.3 C)-98.4 F (36.9 C)] 97.5 F (36.4 C) (01/13 0540) Pulse Rate:  [60-74] 60 (01/13 0540) Resp:  [18-20] 18 (01/13 0540) BP: (133-167)/(62-87) 134/79 mmHg (01/13 0540) SpO2:  [98 %-99 %] 99 % (01/13 0540) Last BM Date: 09/05/13 480 PO yesterday, afebrile, VSS Labs OK INR 1.20 Intake/Output from previous day: 01/12 0701 - 01/13 0700 In: 1429.3 [P.O.:480; I.V.:899.3; IV Piggyback:50] Out: 1025 [Urine:1025] Intake/Output this shift:    General appearance: alert, cooperative and no distress GI: soft, non-tender; bowel sounds normal; no masses,  no organomegaly cath site OK  Lab Results:   Recent Labs  09/07/13 0527 09/08/13 0524  WBC 6.4 7.2  HGB 11.9* 12.0*  HCT 35.0* 34.9*  PLT 257 270    BMET  Recent Labs  09/07/13 0527 09/08/13 0524  NA 134* 133*  K 3.9 4.2  CL 98 98  CO2 22 21  GLUCOSE 116* 104*  BUN 7 9  CREATININE 0.86 0.88  CALCIUM 9.5 9.5   PT/INR  Recent Labs  09/07/13 0527 09/08/13 0524  LABPROT 15.2 14.9  INR 1.23 1.20     Recent Labs Lab 09/04/13 0500 09/05/13 0530 09/06/13 0522 09/07/13 0527 09/08/13 0524  AST 62* 47* 33 33 49*  ALT 77* 73* 64* 63* 63*  ALKPHOS 114 109 102 104 103  BILITOT 0.5 0.4 0.4 0.5 0.7  PROT 6.1 6.0 6.1 6.5 6.5  ALBUMIN 2.9* 2.9* 3.0* 3.2* 3.3*     Lipase     Component Value Date/Time   LIPASE 17 09/01/2013 1055     Studies/Results: No results found.  Medications: . aspirin EC  81 mg Oral Daily  . atorvastatin  40 mg Oral Daily  . carvedilol  25 mg Oral BID WC  . darifenacin  15 mg Oral Daily  . digoxin  0.125 mg Oral Daily  . docusate sodium  100 mg Oral BID  . multivitamin with minerals  1 tablet Oral Daily  . pantoprazole (PROTONIX) IV  40 mg  Intravenous Q12H  . piperacillin-tazobactam (ZOSYN)  IV  3.375 g Intravenous Q8H  . potassium chloride  10 mEq Oral Daily  . senna  1 tablet Oral BID  . tamsulosin  0.4 mg Oral Daily  . vitamin B-12  1,000 mcg Oral Daily   . sodium chloride 50 mL/hr at 09/07/13 1106    Assessment/Plan 1. Abdominal pain and weakness/mild LFT elevation, rule out cholecystitis. HIDA consistant with chronic cholecystitis Acalculous cholecystitis currently asymptomatic. 2. S/p CABG and AVR with [facility] valve 10/212/03 (4 positive troponin's on admit) positive Stress test,09/03/13.  Cardiac cath report pending cleared for Surgery by Dr. Linard Millers, 09/07/13. 3. Chronic Atrial fibrillation on chronic coumadin, currently on Heparin 4. COPD/hx of tobacco use  5. Right renal cell cancer with ablation 2010, and 2013.  6. Weight loss  7. Hyponatremia  8. Hypertension  9. Hx of ulcers/GERD  10. Hyperlipidemia  11. BPH  12.Chronic constipation  13. Arthritis/T12 compression fracture.   PLan:  We plan to do cholecystectomy later this AM.  Heparin is off and he is still on Zosyn, pain free and able to eat currently without difficulty, NPO for surgery later today.  LOS: 7 days  [name] 09/08/2013

## 2013-09-08 NOTE — Progress Notes (Signed)
Dr. Winfred Leeds  Aware of patient's blood pressures.

## 2013-09-08 NOTE — Preoperative (Signed)
Beta Blockers   Reason not to administer Beta Blockers:Not Applicable 

## 2013-09-08 NOTE — Anesthesia Postprocedure Evaluation (Signed)
Anesthesia Post Note  Patient: [name]  Procedure(s) Performed: Procedure(s) (LRB): LAPAROSCOPIC CHOLECYSTECTOMY WITH INTRAOPERATIVE CHOLANGIOGRAM (N/A)  Anesthesia type: General  Patient location: PACU  Post pain: Pain level controlled  Post assessment: Post-op Vital signs reviewed  Last Vitals:  Filed Vitals:   09/08/13 1530  BP: 178/74  Pulse: 63  Temp: 36.7 C  Resp: 18    Post vital signs: Reviewed  Level of consciousness: sedated  Complications: No apparent anesthesia complications

## 2013-09-08 NOTE — Progress Notes (Signed)
Patient back from surgery, arousable and follows command, C/O abd pain around the surgical site, pain medication given prior to transfer from PACU. Incision site dressing clean/dry and intact. Will continue to assess patient. Family at bedside with patient.

## 2013-09-08 NOTE — Progress Notes (Signed)
Patient seen and examined.  Agree with PA's note.  

## 2013-09-08 NOTE — Anesthesia Preprocedure Evaluation (Addendum)
Anesthesia Evaluation  Patient identified by MRN, date of birth, ID band Patient awake    Reviewed: Allergy & Precautions, H&P , NPO status , Patient's Chart, lab work & pertinent test results  Airway Mallampati: II TM Distance: >3 FB Neck ROM: Full    Dental no notable dental hx. (+) Teeth Intact, Dental Advisory Given and Caps   Pulmonary shortness of breath, former smoker,  breath sounds clear to auscultation  Pulmonary exam normal       Cardiovascular hypertension, Pt. on medications and Pt. on home beta blockers + CAD and + CABG + dysrhythmias Atrial Fibrillation + Valvular Problems/Murmurs AS Rhythm:Regular Rate:Normal  S/P aortic valve replacement.   Neuro/Psych negative neurological ROS  negative psych ROS   GI/Hepatic Neg liver ROS, GERD-  Medicated,  Endo/Other  negative endocrine ROS  Renal/GU negative Renal ROS  negative genitourinary   Musculoskeletal negative musculoskeletal ROS (+)   Abdominal   Peds  Hematology  (+) anemia ,   Anesthesia Other Findings   Reproductive/Obstetrics negative OB ROS                          Anesthesia Physical Anesthesia Plan  ASA: III  Anesthesia Plan: General   Post-op Pain Management:    Induction: Intravenous  Airway Management Planned: Oral ETT  Additional Equipment:   Intra-op Plan:   Post-operative Plan: Extubation in OR  Informed Consent: I have reviewed the patients History and Physical, chart, labs and discussed the procedure including the risks, benefits and alternatives for the proposed anesthesia with the patient or authorized representative who has indicated his/her understanding and acceptance.   Dental advisory given  Plan Discussed with: CRNA  Anesthesia Plan Comments:         Anesthesia Quick Evaluation

## 2013-09-09 ENCOUNTER — Encounter (HOSPITAL_COMMUNITY): Payer: Self-pay | Admitting: General Surgery

## 2013-09-09 LAB — BASIC METABOLIC PANEL
BUN: 10 mg/dL (ref 6–23)
CO2: 22 mEq/L (ref 19–32)
Calcium: 9 mg/dL (ref 8.4–10.5)
Chloride: 98 mEq/L (ref 96–112)
Creatinine, Ser: 0.85 mg/dL (ref 0.50–1.35)
GFR, EST NON AFRICAN AMERICAN: 82 mL/min — AB (ref >90)
GLUCOSE: 108 mg/dL — AB (ref 70–99)
POTASSIUM: 4.3 meq/L (ref 3.7–5.3)
Sodium: 133 mEq/L — ABNORMAL LOW (ref 137–147)

## 2013-09-09 LAB — CBC
HCT: 34 % — ABNORMAL LOW (ref 39.0–52.0)
HEMOGLOBIN: 11.6 g/dL — AB (ref 13.0–17.0)
MCH: 28.4 pg (ref 26.0–34.0)
MCHC: 34.1 g/dL (ref 30.0–36.0)
MCV: 83.1 fL (ref 78.0–100.0)
PLATELETS: 247 10*3/uL (ref 150–400)
RBC: 4.09 MIL/uL — AB (ref 4.22–5.81)
RDW: 12.6 % (ref 11.5–15.5)
WBC: 9.3 10*3/uL (ref 4.0–10.5)

## 2013-09-09 LAB — GLUCOSE, CAPILLARY: GLUCOSE-CAPILLARY: 103 mg/dL — AB (ref 70–99)

## 2013-09-09 LAB — PROTIME-INR
INR: 1.17 (ref 0.00–1.49)
Prothrombin Time: 14.7 seconds (ref 11.6–15.2)

## 2013-09-09 LAB — HEPARIN LEVEL (UNFRACTIONATED): Heparin Unfractionated: 0.53 IU/mL (ref 0.30–0.70)

## 2013-09-09 MED ORDER — METOPROLOL TARTRATE 1 MG/ML IV SOLN: 5.0000 mg | Freq: Four times a day (QID) | INTRAVENOUS | Status: DC | PRN

## 2013-09-09 NOTE — Progress Notes (Addendum)
[facility] PROGRESS NOTE  [name] VXB:[id number] DOB: Nov 04, 1933 DOA: 09/01/2013 PCP: Gennette Pac, MD  Assessment/Plan  POD 1 s/p Chronic calculus cholecystitis s/p laparoscopy cholecystectomy on 1/13 by Dr. Zella Richer.  Extensive dissection around the liver during procedure and at risk of post-operative bleeding.   -  Heparin gtt for now -  Appreciate surgery assistance -  Diet per surgery -  Not passing gas and no BMs yet -  D/c zosyn  CAD s/p CABG s/p NSTEMI, possible type two, however, heart catheterization 09/07/2013 demonstrated widely patent grafts but an area of potential ischemia to the third obtuse marginal territory  - ASA, statin, BB  -Appreciate Cardiology following  -restart torsemide once tolerating diet  Permanent atrial fibrillation/Mechanical AVR  - on A/C and rate controlled on BB and dig  - Restart heparin drip 09/09/13 and coumadin 09/09/13  - will need LMWH bridge if goes home due to high risk of AVR thrombosis  -Echo EF 55-60%, well function AV prosthesis   HTN, elevated BPs may be related to post-operative pain -  Continue carvediilol -  Control pain -  Add prn metoprolol IV  Progressive generalized weakness  - overall improving during hospitalization  - Multifactorial including deconditioning as well as his infectious process and other electrolyte arrangements  - continue gentle IVF  - PT-recommended home health PT  - TSH--0.875  - Cortisol 14.1   Mechanical AVR  -IV heparin restart 09/09/13   GERD,  -possible acid-reflux like symptoms  -Continue PPI   Leukocytosis resolved with abx and cholecystectomy  Acute blood loss anemia and anemia of chronic disease, hgb stable  Diet:  Healthy heart Access:  PIV IVF:  yes Proph:  Heparin gtt  Code Status: full Family Communication: patient and his wife Disposition Plan:  Once surgery deems him stable for discharge    Consultants:  General surgery, Dr.  Zella Richer  Cardiology, Drs. Daneen Schick, [place]    Procedures:  CXR  Lap chole with intraoperative cholangiogram 09/08/2013  HIDA 09/03/2013  Abd [place] 09/01/2013  Ct abd/pelvis 1/6/015  Cardiac catheterization 09/07/2013  NM stress 09/05/2013   Antibiotics: Zosyn 09/02/13>>> 09/09/2013    HPI/Subjective:  States he feels well today.  Drinking some.  Abdomen is sore.  Denies flatus, BMs.      Objective: Filed Vitals:   09/09/13 0209 09/09/13 0643 09/09/13 1115 09/09/13 1403  BP: 151/73 146/76  143/64  Pulse: 77 68  83  Temp: 97.8 F (36.6 C) 98.3 F (36.8 C)  98.5 F (36.9 C)  TempSrc: Oral Oral  Oral  Resp: 16 20  20   Height:      Weight:      SpO2: 99% 99% 99% 95%    Intake/Output Summary (Last 24 hours) at 09/09/13 1631 Last data filed at 09/09/13 1620  Gross per 24 hour  Intake 2295.83 ml  Output   3495 ml  Net -1199.17 ml   Filed Weights   09/01/13 1700 09/07/13 0434  Weight: 103.6 kg (228 lb 6.3 oz) 102.105 kg (225 lb 1.6 oz)    Exam:   General:  CM, No acute distress  HEENT:  NCAT, MMM  Cardiovascular:  RRR, nl S1, mechanical S2, 1/6 systolic murmur LSB, no rubs or gallops, 2+ pulses, warm extremities  Respiratory:  CTAB, no increased WOB  Abdomen:   NABS, soft, TTP diffusely without rebound or guarding.  Port dressings c/d/i.  Drain with serous material  MSK:   Normal tone and bulk, no  LEE  Neuro:  Grossly intact  Data Reviewed: Basic Metabolic Panel:  Recent Labs Lab 09/05/13 0530 09/06/13 0522 09/07/13 0527 09/08/13 0524 09/09/13 0608  NA 137 135* 134* 133* 133*  K 4.2 4.1 3.9 4.2 4.3  CL 100 99 98 98 98  CO2 26 25 22 21 22   GLUCOSE 112* 122* 116* 104* 108*  BUN 9 9 7 9 10   CREATININE 0.90 1.03 0.86 0.88 0.85  CALCIUM 9.4 9.3 9.5 9.5 9.0  MG  --   --  1.9  --   --    Liver Function Tests:  Recent Labs Lab 09/04/13 0500 09/05/13 0530 09/06/13 0522 09/07/13 0527 09/08/13 0524  AST 62* 47* 33 33 49*  ALT  77* 73* 64* 63* 63*  ALKPHOS 114 109 102 104 103  BILITOT 0.5 0.4 0.4 0.5 0.7  PROT 6.1 6.0 6.1 6.5 6.5  ALBUMIN 2.9* 2.9* 3.0* 3.2* 3.3*   No results found for this basename: LIPASE, AMYLASE,  in the last 168 hours No results found for this basename: AMMONIA,  in the last 168 hours CBC:  Recent Labs Lab 09/05/13 0530 09/06/13 0522 09/07/13 0527 09/08/13 0524 09/09/13 0608  WBC 6.9 6.8 6.4 7.2 9.3  HGB 11.7* 11.3* 11.9* 12.0* 11.6*  HCT 34.2* 32.7* 35.0* 34.9* 34.0*  MCV 83.6 82.6 83.1 82.9 83.1  PLT 187 241 257 270 247   Cardiac Enzymes: No results found for this basename: CKTOTAL, CKMB, CKMBINDEX, TROPONINI,  in the last 168 hours BNP (last 3 results) No results found for this basename: PROBNP,  in the last 8760 hours CBG:  Recent Labs Lab 09/02/13 2157 09/03/13 0736 09/09/13 0732  GLUCAP 134* 110* 103*    Recent Results (from the past 240 hour(s))  SURGICAL PCR SCREEN     Status: None   Collection Time    09/08/13  6:10 AM      Result Value Range Status   MRSA, PCR NEGATIVE  NEGATIVE Final   Staphylococcus aureus NEGATIVE  NEGATIVE Final   Comment:            The Xpert SA Assay (FDA     approved for NASAL specimens     in patients over 71 years of age),     is one component of     a comprehensive surveillance     program.  Test performance has     been validated by [employer] for patients greater     than or equal to 76 year old.     It is not intended     to diagnose infection nor to     guide or monitor treatment.     Studies: Dg Cholangiogram Operative  09/08/2013   CLINICAL DATA:  Cholecystitis  EXAM: INTRAOPERATIVE CHOLANGIOGRAM  TECHNIQUE: Cholangiographic images from the C-arm fluoroscopic device were submitted for interpretation post-operatively. Please see the procedural report for the amount of contrast and the fluoroscopy time utilized.  COMPARISON:  None.  FINDINGS: No persistent filling defects in the common duct. Intrahepatic ducts  are incompletely visualized, appearing decompressed centrally. Contrast passes into the duodenum.  : Negative for retained common duct stone.   Electronically Signed   By: Arne Cleveland M.D.   On: 09/08/2013 14:34    Scheduled Meds: . [START ON 09/11/2013] aspirin EC  81 mg Oral Daily  . atorvastatin  40 mg Oral Daily  . carvedilol  25 mg Oral BID WC  . darifenacin  15  mg Oral Daily  . digoxin  0.125 mg Oral Daily  . docusate sodium  100 mg Oral BID  . multivitamin with minerals  1 tablet Oral Daily  . pantoprazole (PROTONIX) IV  40 mg Intravenous Q12H  . potassium chloride  10 mEq Oral Daily  . senna  1 tablet Oral BID  . tamsulosin  0.4 mg Oral Daily  . vitamin B-12  1,000 mcg Oral Daily   Continuous Infusions: . sodium chloride 50 mL/hr at 09/09/13 1135  . heparin 1,900 Units/hr (09/09/13 0605)    Active Problems:   HYPERTENSION   CAD   Atrial fibrillation   AORTIC VALVE REPLACEMENT, HX OF   Chronic diastolic heart failure   Abdominal pain, other specified site   Constipation   Hypotension   Leukocytosis   Normocytic anemia   Hyponatremia   Acalculous cholecystitis   Chronic calculous cholecystitis    Time spent: 30 min    Tamim Skog, [facility] Pager [phone]. If 7PM-7AM, please contact night-coverage at [email or web address], password [facility] 09/09/2013, 4:31 PM  LOS: 8 days

## 2013-09-09 NOTE — Progress Notes (Signed)
1 Day Post-Op  Subjective: Sore.  Tolerating clear liquids.  Objective: Vital signs in last 24 hours: Temp:  [97.7 F (36.5 C)-98.3 F (36.8 C)] 98.3 F (36.8 C) (01/14 0643) Pulse Rate:  [53-78] 68 (01/14 0643) Resp:  [13-20] 20 (01/14 0643) BP: (142-184)/(61-80) 146/76 mmHg (01/14 0643) SpO2:  [98 %-100 %] 99 % (01/14 0643) Last BM Date: 09/07/13  Intake/Output from previous day: 01/13 0701 - 01/14 0700 In: 3655.8 [P.O.:600; I.V.:3005.8; IV Piggyback:50] Out: 7902 [IOXBD:5329; Drains:80; Blood:20] Intake/Output this shift: Total I/O In: -  Out: 175 [Urine:175]  PE: General- In NAD Abdomen-soft, dressings dry, thin bloody drain output  Lab Results:   Recent Labs  09/08/13 0524 09/09/13 0608  WBC 7.2 9.3  HGB 12.0* 11.6*  HCT 34.9* 34.0*  PLT 270 247   BMET  Recent Labs  09/08/13 0524 09/09/13 0608  NA 133* 133*  K 4.2 4.3  CL 98 98  CO2 21 22  GLUCOSE 104* 108*  BUN 9 10  CREATININE 0.88 0.85  CALCIUM 9.5 9.0   PT/INR  Recent Labs  09/08/13 0524 09/09/13 0608  LABPROT 14.9 14.7  INR 1.20 1.17   Comprehensive Metabolic Panel:    Component Value Date/Time   NA 133* 09/09/2013 0608   NA 133* 09/08/2013 0524   K 4.3 09/09/2013 0608   K 4.2 09/08/2013 0524   CL 98 09/09/2013 0608   CL 98 09/08/2013 0524   CO2 22 09/09/2013 0608   CO2 21 09/08/2013 0524   BUN 10 09/09/2013 0608   BUN 9 09/08/2013 0524   CREATININE 0.85 09/09/2013 0608   CREATININE 0.88 09/08/2013 0524   CREATININE 0.84 06/03/2013 0932   CREATININE 1.07 01/07/2013 0919   GLUCOSE 108* 09/09/2013 0608   GLUCOSE 104* 09/08/2013 0524   CALCIUM 9.0 09/09/2013 0608   CALCIUM 9.5 09/08/2013 0524   AST 49* 09/08/2013 0524   AST 33 09/07/2013 0527   ALT 63* 09/08/2013 0524   ALT 63* 09/07/2013 0527   ALKPHOS 103 09/08/2013 0524   ALKPHOS 104 09/07/2013 0527   BILITOT 0.7 09/08/2013 0524   BILITOT 0.5 09/07/2013 0527   PROT 6.5 09/08/2013 0524   PROT 6.5 09/07/2013 0527   ALBUMIN 3.3* 09/08/2013 0524    ALBUMIN 3.2* 09/07/2013 0527     Studies/Results: Dg Cholangiogram Operative  09/08/2013   CLINICAL DATA:  Cholecystitis  EXAM: INTRAOPERATIVE CHOLANGIOGRAM  TECHNIQUE: Cholangiographic images from the C-arm fluoroscopic device were submitted for interpretation post-operatively. Please see the procedural report for the amount of contrast and the fluoroscopy time utilized.  COMPARISON:  None.  FINDINGS: No persistent filling defects in the common duct. Intrahepatic ducts are incompletely visualized, appearing decompressed centrally. Contrast passes into the duodenum.  : Negative for retained common duct stone.   Electronically Signed   By: Arne Cleveland M.D.   On: 09/08/2013 14:34    Anti-infectives: Anti-infectives   Start     Dose/Rate Route Frequency Ordered Stop   09/08/13 1800  piperacillin-tazobactam (ZOSYN) IVPB 3.375 g     3.375 g 12.5 mL/hr over 240 Minutes Intravenous Every 8 hours 09/08/13 1614 09/09/13 0624   09/02/13 1800  piperacillin-tazobactam (ZOSYN) IVPB 3.375 g  Status:  Discontinued     3.375 g 12.5 mL/hr over 240 Minutes Intravenous Every 8 hours 09/02/13 1719 09/08/13 1614      Assessment Active Problems:   Chronic calculous cholecystitis post lap chole with IOC 09/08/13-stable overnight    LOS: 8 days   Plan: Advance  diet.  Will need to observe carefully for postop bleeding given the extent of the liver dissection to remove the intrahepatic gallbladder and his starting the anticoagulation.   Zhanae Proffit J 09/09/2013

## 2013-09-09 NOTE — Progress Notes (Signed)
ANTICOAGULATION CONSULT NOTE - Follow Up Consult  Pharmacy Consult for IV heparin Indication: Mechanical AVR (St.Jude), atrial fibrillation  No Known Allergies  Patient Measurements: Height: 6\' 4"  (193 cm) Weight: 225 lb 1.6 oz (102.105 kg) IBW/kg (Calculated) : 86.8 Heparin Dosing Weight: 103.6 kg   Labs:  Recent Labs  09/07/13 0527 09/08/13 0524 09/09/13 0608 09/09/13 1411  HGB 11.9* 12.0* 11.6*  --   HCT 35.0* 34.9* 34.0*  --   PLT 257 270 247  --   LABPROT 15.2 14.9 14.7  --   INR 1.23 1.20 1.17  --   HEPARINUNFRC 0.57  --   --  0.53  CREATININE 0.86 0.88 0.85  --     Estimated Creatinine Clearance: 89.4 ml/min (by C-G formula based on Cr of 0.85).   Assessment: 78 yoM on Coumadin PTA for h/o aortic valve replacement and atrial fibrillation (Goal INR 2-3 per [facility]). Patient was admitted with cholecystitis and given elevated troponin - Coumadin stopped, IV heparin started. Patient went for cardiac catherization 1/12. IV heparin resumed post-cath and stopped 1/13 AM for cholecystectomy. Post-op, MD ordered to resume IV heparin 1/14 @ 0600.  IV heparin currently running at 1900 units/hr and heparin level returned therapeutic this PM at 0.53. CBC stable, no bleeding. MD noted to observe carefully for post-op bleeding given the extend of the liver dissection to remove the intrahepatic gallbladder.    Goal of Therapy:  Heparin level 0.3-0.7 units/ml Monitor platelets by anticoagulation protocol: Yes   Plan:   Continue IV heparin at 1900 units/hr   No confirmatory level needed as patient has been therapeutic on this dose recently.   Daily heparin level and CBC   F/u when safe to resume Coumadin   Vanessa Woodford, PharmD, BCPS Pager: [phone] 2:38 PM Pharmacy #: [id number]

## 2013-09-09 NOTE — Progress Notes (Signed)
Physical Therapy Treatment Patient Details Name: [name] MRN: [id number] DOB: [birth year] Today's Date: 09/09/2013 Time: 2992-4268 PT Time Calculation (min): 15 min  PT Assessment / Plan / Recommendation  History of Present Illness 80 yo with history of CABG + mechanical AVR in 10/13 and permanent atrial fibrillation on warfarin came to the ER today with diffuse weakness and abdominal pain. Until last Friday, patient was at his baseline. He is not very active but had no exertional chest pain or significant exertional dyspnea. On Friday, he was very weak. He slipped and slid to the ground. He was unable to get up. Family got him in bed. Since that time, he has been profoundly weak with minimal ability to ambulate. He has been in bed. On Friday, wife says that he had visual hallucinations though he is not having these anymore. The weakness is generalized with no focality. He also has had upper abdominal pain fairly constantly for 1-2 weeks. It is relatively mild and is localized to the RUQ. He is tender in the RUQ. The ER MD did an abdominal US and said it looked like "chronic cholecystitis." He has had no chest pain. No fever/chills/nausea/diarrhea/cough/congestion. CXR did not show PNA. UA did not show UTI. He was noted to have a mildly elevated troponin level so cardiology was called. No acute changes on ECG (chronic atrial fibrillation).     PT Comments   Pt tolerated well and mentioned he had walked with nursing this morning as well. He just gets fatigued in his legs.   Follow Up Recommendations  Home health PT     Does the patient have the potential to tolerate intense rehabilitation     Barriers to Discharge        Equipment Recommendations  None recommended by PT    Recommendations for Other Services    Frequency Min 3X/week   Progress towards PT Goals Progress towards PT goals: Progressing toward goals  Plan Current plan remains appropriate    Precautions / Restrictions      Pertinent Vitals/Pain Pain in R side with bed mobility    Mobility  Bed Mobility Overal bed mobility: Needs Assistance Bed Mobility: Supine to Sit;Sit to Supine Supine to sit: Min assist Sit to supine: Min assist General bed mobility comments: due to pain at drain and surgical site at right abdomen Transfers Overall transfer level: Needs assistance Equipment used: Rolling walker (2 wheeled) Sit to Stand: Supervision;Min guard Stand pivot transfers: Supervision;Min guard General transfer comment: one VC on safety with turns to stay close to RW Ambulation/Gait Ambulation/Gait assistance: Min guard Ambulation Distance (Feet): 90 Feet Assistive device: Rolling walker (2 wheeled) General Gait Details: just little tired due to recent surgery 2 days ago, that is why distance and assist level is different    Exercises     PT Diagnosis:    PT Problem List:   PT Treatment Interventions:     PT Goals (current goals can now be found in the care plan section) Acute Rehab PT Goals Patient Stated Goal: hopefully I will go home tomorrow  Visit Information  Last PT Received On: 09/09/13 Assistance Needed: +1 History of Present Illness: 80 yo with history of CABG + mechanical AVR in 10/13 and permanent atrial fibrillation on warfarin came to the ER today with diffuse weakness and abdominal pain. Until last Friday, patient was at his baseline. He is not very active but had no exertional chest pain or significant exertional dyspnea. On Friday, he was very  weak. He slipped and slid to the ground. He was unable to get up. Family got him in bed. Since that time, he has been profoundly weak with minimal ability to ambulate. He has been in bed. On Friday, wife says that he had visual hallucinations though he is not having these anymore. The weakness is generalized with no focality. He also has had upper abdominal pain fairly constantly for 1-2 weeks. It is relatively mild and is localized to the RUQ. He  is tender in the RUQ. The ER MD did an abdominal US and said it looked like "chronic cholecystitis." He has had no chest pain. No fever/chills/nausea/diarrhea/cough/congestion. CXR did not show PNA. UA did not show UTI. He was noted to have a mildly elevated troponin level so cardiology was called. No acute changes on ECG (chronic atrial fibrillation).      Subjective Data  Patient Stated Goal: hopefully I will go home tomorrow   Cognition       Balance     End of Session PT - End of Session Equipment Utilized During Treatment: Gait belt Activity Tolerance: Patient tolerated treatment well Patient left: in bed;with call bell/phone within reach;with family/visitor present Nurse Communication: Mobility status   GP     Clide Dales 09/09/2013, 4:19 PM Clide Dales, PT Pager: [phone] 09/09/2013

## 2013-09-10 DIAGNOSIS — K5900 Constipation, unspecified: Secondary | ICD-10-CM

## 2013-09-10 LAB — CBC
HEMATOCRIT: 32.2 % — AB (ref 39.0–52.0)
Hemoglobin: 10.9 g/dL — ABNORMAL LOW (ref 13.0–17.0)
MCH: 28.5 pg (ref 26.0–34.0)
MCHC: 33.9 g/dL (ref 30.0–36.0)
MCV: 84.3 fL (ref 78.0–100.0)
PLATELETS: 245 10*3/uL (ref 150–400)
RBC: 3.82 MIL/uL — ABNORMAL LOW (ref 4.22–5.81)
RDW: 13 % (ref 11.5–15.5)
WBC: 8.5 10*3/uL (ref 4.0–10.5)

## 2013-09-10 LAB — PROTIME-INR
INR: 1.23 (ref 0.00–1.49)
Prothrombin Time: 15.2 seconds (ref 11.6–15.2)

## 2013-09-10 LAB — COMPREHENSIVE METABOLIC PANEL
ALT: 62 U/L — AB (ref 0–53)
AST: 40 U/L — AB (ref 0–37)
Albumin: 2.8 g/dL — ABNORMAL LOW (ref 3.5–5.2)
Alkaline Phosphatase: 87 U/L (ref 39–117)
BUN: 8 mg/dL (ref 6–23)
CALCIUM: 9 mg/dL (ref 8.4–10.5)
CO2: 25 mEq/L (ref 19–32)
CREATININE: 0.92 mg/dL (ref 0.50–1.35)
Chloride: 98 mEq/L (ref 96–112)
GFR calc Af Amer: >90 mL/min (ref >90)
GFR calc non Af Amer: 79 mL/min — ABNORMAL LOW (ref >90)
Glucose, Bld: 115 mg/dL — ABNORMAL HIGH (ref 70–99)
Potassium: 4 mEq/L (ref 3.7–5.3)
SODIUM: 134 meq/L — AB (ref 137–147)
Total Bilirubin: 0.6 mg/dL (ref 0.3–1.2)
Total Protein: 5.9 g/dL — ABNORMAL LOW (ref 6.0–8.3)

## 2013-09-10 LAB — HEPARIN LEVEL (UNFRACTIONATED): Heparin Unfractionated: 0.49 IU/mL (ref 0.30–0.70)

## 2013-09-10 MED ORDER — ASPIRIN 81 MG PO TBEC: 81.0000 mg | DELAYED_RELEASE_TABLET | Freq: Every day | ORAL | Status: AC

## 2013-09-10 MED ORDER — POLYETHYLENE GLYCOL 3350 17 G PO PACK
17.0000 g | PACK | Freq: Every day | ORAL | Status: DC | PRN
Start: 2013-09-10 — End: 2014-04-05

## 2013-09-10 MED ORDER — DSS 100 MG PO CAPS: 100.0000 mg | ORAL_CAPSULE | Freq: Two times a day (BID) | ORAL | Status: DC

## 2013-09-10 MED ORDER — SENNA 8.6 MG PO TABS: 1.0000 | ORAL_TABLET | Freq: Two times a day (BID) | ORAL | Status: DC

## 2013-09-10 MED ORDER — HYDROCODONE-ACETAMINOPHEN 5-325 MG PO TABS: 1.0000 | ORAL_TABLET | ORAL | Status: DC | PRN

## 2013-09-10 MED ORDER — BISACODYL 10 MG RE SUPP: 10.0000 mg | Freq: Every day | RECTAL | Status: DC | PRN

## 2013-09-10 MED ORDER — WARFARIN - PHARMACIST DOSING INPATIENT: Freq: Every day | Status: DC

## 2013-09-10 MED ORDER — WARFARIN SODIUM 10 MG PO TABS
10.0000 mg | ORAL_TABLET | Freq: Once | ORAL | Status: AC
  Administered 2013-09-10: 14:00:00 10 mg via ORAL
  Filled 2013-09-10: qty 1

## 2013-09-10 MED ORDER — ENOXAPARIN SODIUM 150 MG/ML ~~LOC~~ SOLN: 150.0000 mg | SUBCUTANEOUS | Status: DC

## 2013-09-10 MED ORDER — ENOXAPARIN SODIUM 150 MG/ML ~~LOC~~ SOLN
150.0000 mg | SUBCUTANEOUS | Status: DC
  Administered 2013-09-10: 150 mg via SUBCUTANEOUS
  Filled 2013-09-10: qty 1

## 2013-09-10 NOTE — Progress Notes (Addendum)
ANTICOAGULATION CONSULT NOTE - Follow Up Consult  Pharmacy Consult for IV heparin Indication: Mechanical AVR (St.Jude), atrial fibrillation  No Known Allergies  Patient Measurements: Height: 6\' 4"  (193 cm) Weight: 225 lb 1.6 oz (102.105 kg) IBW/kg (Calculated) : 86.8 Heparin Dosing Weight: 103.6 kg   Labs:  Recent Labs  09/08/13 0524 09/09/13 0608 09/09/13 1411 09/10/13 0532  HGB 12.0* 11.6*  --  10.9*  HCT 34.9* 34.0*  --  32.2*  PLT 270 247  --  245  LABPROT 14.9 14.7  --  15.2  INR 1.20 1.17  --  1.23  HEPARINUNFRC  --   --  0.53 0.49  CREATININE 0.88 0.85  --  0.92    Estimated Creatinine Clearance: 82.6 ml/min (by C-G formula based on Cr of 0.92).   Assessment: 73 yoM on Coumadin PTA for h/o aortic valve replacement and atrial fibrillation (Goal INR 2-3 per [facility]). Patient was admitted with cholecystitis and given elevated troponin - Coumadin stopped, IV heparin started. Patient went for cardiac catherization 1/12. IV heparin resumed post-cath and stopped 1/13 AM for cholecystectomy. Post-op, MD ordered to resume IV heparin 1/14 @ 0600. Received verbal order from Dr. Carles Collet to resume Coumadin 1/15. Pt reportedly was receiving Coumadin 7.5mg  daily except 10mg  on Sun/Tue/Thur PTA.   IV heparin currently running at 1900 units/hr and heparin level returned therapeutic at 0.49. MD noted to observe carefully for post-op bleeding given the extend of the liver dissection to remove the intrahepatic gallbladder. CBC stable, no bleeding per communication with patient.   INR 1.23  Possible discharge today - awaiting MD decision  Goal of Therapy:  Heparin level 0.3-0.7 units/ml Monitor platelets by anticoagulation protocol: Yes   Plan:   Continue IV heparin at 1900 units/hr   Coumadin 10mg  po x 1 tonight  Daily heparin level and CBC   If plan discharge today, please consider Lovenox 150 mg sq daily bridge with Coumadin. Would give Coumadin 10mg  tonight and  tomorrow night then on Saturday - resume home regimen of 7.5mg  daily EXCEPT 10mg  on Sun/Tue/Thur. Would check INR in ~3 days and stop Lovenox when INR therapeutic (goal 2-3)  Pharmacy will f/u  Vanessa Kenmar, PharmD, BCPS Pager: [phone] 10:00 AM Pharmacy #: [id number]     Addendum:   Plan discharge today, MD would like to start Lovenox.   Plan: Lovenox 150 mg sq daily to start 1 hour after IV heparin drip is stopped. Coumadin recommendation for discharge as above. RN aware of plans.   Vanessa Lovell, PharmD, BCPS Pager: [phone] 1:30 PM Pharmacy #: [id number]

## 2013-09-10 NOTE — Progress Notes (Signed)
2 Days Post-Op  Subjective: Feels good.  Passing gas.  No BM yet   Objective: Vital signs in last 24 hours: Temp:  [98.4 F (36.9 C)-98.5 F (36.9 C)] 98.5 F (36.9 C) (01/15 0500) Pulse Rate:  [77-83] 77 (01/15 0500) Resp:  [18-20] 18 (01/15 0500) BP: (121-143)/(55-64) 121/60 mmHg (01/15 0500) SpO2:  [95 %-99 %] 99 % (01/15 0500) Last BM Date: 09/07/13  Intake/Output from previous day: 01/14 0701 - 01/15 0700 In: 1768 [P.O.:440; I.V.:1328] Out: 3210 [Urine:3075; Drains:135] Intake/Output this shift: Total I/O In: 240 [P.O.:240] Out: -   PE: General- In NAD Abdomen-soft, incisions clean and intact, more serous-type drain output this AM  Lab Results:   Recent Labs  09/09/13 0608 09/10/13 0532  WBC 9.3 8.5  HGB 11.6* 10.9*  HCT 34.0* 32.2*  PLT 247 245   BMET  Recent Labs  09/09/13 0608 09/10/13 0532  NA 133* 134*  K 4.3 4.0  CL 98 98  CO2 22 25  GLUCOSE 108* 115*  BUN 10 8  CREATININE 0.85 0.92  CALCIUM 9.0 9.0   PT/INR  Recent Labs  09/09/13 0608 09/10/13 0532  LABPROT 14.7 15.2  INR 1.17 1.23   Comprehensive Metabolic Panel:    Component Value Date/Time   NA 134* 09/10/2013 0532   NA 133* 09/09/2013 0608   K 4.0 09/10/2013 0532   K 4.3 09/09/2013 0608   CL 98 09/10/2013 0532   CL 98 09/09/2013 0608   CO2 25 09/10/2013 0532   CO2 22 09/09/2013 0608   BUN 8 09/10/2013 0532   BUN 10 09/09/2013 0608   CREATININE 0.92 09/10/2013 0532   CREATININE 0.85 09/09/2013 0608   CREATININE 0.84 06/03/2013 0932   CREATININE 1.07 01/07/2013 0919   GLUCOSE 115* 09/10/2013 0532   GLUCOSE 108* 09/09/2013 0608   CALCIUM 9.0 09/10/2013 0532   CALCIUM 9.0 09/09/2013 0608   AST 40* 09/10/2013 0532   AST 49* 09/08/2013 0524   ALT 62* 09/10/2013 0532   ALT 63* 09/08/2013 0524   ALKPHOS 87 09/10/2013 0532   ALKPHOS 103 09/08/2013 0524   BILITOT 0.6 09/10/2013 0532   BILITOT 0.7 09/08/2013 0524   PROT 5.9* 09/10/2013 0532   PROT 6.5 09/08/2013 0524   ALBUMIN 2.8* 09/10/2013  0532   ALBUMIN 3.3* 09/08/2013 0524     Studies/Results: Dg Cholangiogram Operative  09/08/2013   CLINICAL DATA:  Cholecystitis  EXAM: INTRAOPERATIVE CHOLANGIOGRAM  TECHNIQUE: Cholangiographic images from the C-arm fluoroscopic device were submitted for interpretation post-operatively. Please see the procedural report for the amount of contrast and the fluoroscopy time utilized.  COMPARISON:  None.  FINDINGS: No persistent filling defects in the common duct. Intrahepatic ducts are incompletely visualized, appearing decompressed centrally. Contrast passes into the duodenum.  : Negative for retained common duct stone.   Electronically Signed   By: Arne Cleveland M.D.   On: 09/08/2013 14:34    Anti-infectives: Anti-infectives   Start     Dose/Rate Route Frequency Ordered Stop   09/08/13 1800  piperacillin-tazobactam (ZOSYN) IVPB 3.375 g     3.375 g 12.5 mL/hr over 240 Minutes Intravenous Every 8 hours 09/08/13 1614 09/09/13 0624   09/02/13 1800  piperacillin-tazobactam (ZOSYN) IVPB 3.375 g  Status:  Discontinued     3.375 g 12.5 mL/hr over 240 Minutes Intravenous Every 8 hours 09/02/13 1719 09/08/13 1614      Assessment Active Problems:   Chronic calculous cholecystitis post lap chole with IOC 09/08/13-hgb down some today; wounds healing  well.    LOS: 9 days   Plan:   Milk of Magnesia.  Discharge per Medicine.  Follow up in our office next week for drain removal.  [name] J 09/10/2013

## 2013-09-10 NOTE — Discharge Instructions (Addendum)
No lifting over 10 pounds for 2 weeks.  Strict lowfat diet.  Call Dr. Bertrum Sol office for appointment [phone]) to be seen next week and have drain removed. Record drainage daily amount and color, call if there is a change in drainage, pain, or fever. [facility] ______[facility] LAPAROSCOPIC SURGERY: POST OP INSTRUCTIONS Always review your discharge instruction sheet given to you by the facility where your surgery was performed. IF YOU HAVE DISABILITY OR FAMILY LEAVE FORMS, YOU MUST BRING THEM TO THE OFFICE FOR PROCESSING.   DO NOT GIVE THEM TO YOUR DOCTOR.  1. A prescription for pain medication may be given to you upon discharge.  Take your pain medication as prescribed, if needed.  If narcotic pain medicine is not needed, then you may take acetaminophen (Tylenol) or ibuprofen (Advil) as needed. 2. Take your usually prescribed medications unless otherwise directed. 3. If you need a refill on your pain medication, please contact your pharmacy.  They will contact our office to request authorization. Prescriptions will not be filled after 5pm or on week-ends. 4. You should follow a light diet the first few days after arrival home, such as soup and crackers, etc.  Be sure to include lots of fluids daily. 5. Most patients will experience some swelling and bruising in the area of the incisions.  Ice packs will help.  Swelling and bruising can take several days to resolve.  6. It is common to experience some constipation if taking pain medication after surgery.  Increasing fluid intake and taking a stool softener (such as Colace) will usually help or prevent this problem from occurring.  A mild laxative (Milk of Magnesia or Miralax) should be taken according to package instructions if there are no bowel movements after 48 hours. 7. Unless discharge instructions indicate otherwise, you may remove your bandages 24-48 hours after surgery, and you may shower at that time.  You may have  steri-strips (small skin tapes) in place directly over the incision.  These strips should be left on the skin for 7-10 days.  If your surgeon used skin glue on the incision, you may shower in 24 hours.  The glue will flake off over the next 2-3 weeks.  Any sutures or staples will be removed at the office during your follow-up visit. 8. ACTIVITIES:  You may resume regular (light) daily activities beginning the next day--such as daily self-care, walking, climbing stairs--gradually increasing activities as tolerated.  You may have sexual intercourse when it is comfortable.  Refrain from any heavy lifting or straining until approved by your doctor. a. You may drive when you are no longer taking prescription pain medication, you can comfortably wear a seatbelt, and you can safely maneuver your car and apply brakes. b. RETURN TO WORK:  __________________________________________________________ 9. You should see your doctor in the office for a follow-up appointment approximately 2-3 weeks after your surgery.  Make sure that you call for this appointment within a day or two after you arrive home to insure a convenient appointment time. 10. OTHER INSTRUCTIONS: __________________________________________________________________________________________________________________________ __________________________________________________________________________________________________________________________ WHEN TO CALL YOUR DOCTOR: 1. Fever over 101.0 2. Inability to urinate 3. Continued bleeding from incision. 4. Increased pain, redness, or drainage from the incision. 5. Increasing abdominal pain  The clinic staff is available to answer your questions during regular business hours.  Please dont hesitate to call and ask to speak to one of the nurses for clinical concerns.  If you have a medical emergency, go to the nearest emergency room  or call 911.  A surgeon from [facility] is always on call at the  hospital. [address] [place], [place], [place]  [address] ? [address], [place], [place]   [address] [phone] ? [phone] ? FAX [phone] Web site: [email or web address] Laparoscopic Cholecystectomy Laparoscopic cholecystectomy is surgery to remove the gallbladder. The gallbladder is located in the upper right part of the abdomen, behind the liver. It is a storage sac for bile produced in the liver. Bile aids in the digestion and absorption of fats. Cholecystectomy is often done for inflammation of the gallbladder (cholecystitis). This condition is usually caused by a buildup of gallstones (cholelithiasis) in your gallbladder. Gallstones can block the flow of bile, resulting in inflammation and pain. In severe cases, emergency surgery may be required. When emergency surgery is not required, you will have time to prepare for the procedure. Laparoscopic surgery is an alternative to open surgery. Laparoscopic surgery has a shorter recovery time. Your common bile duct may also need to be examined during the procedure. If stones are found in the common bile duct, they may be removed. LET [facility] CARE PROVIDER KNOW ABOUT:  Any allergies you have.  All medicines you are taking, including vitamins, herbs, eye drops, creams, and over-the-counter medicines.  Previous problems you or members of your family have had with the use of anesthetics.  Any blood disorders you have.  Previous surgeries you have had.  Medical conditions you have. RISKS AND COMPLICATIONS Generally, this is a safe procedure. However, as with any procedure, complications can occur. Possible complications include:  Infection.  Damage to the common bile duct, nerves, arteries, veins, or other internal organs such as the stomach, liver, or intestines.  Bleeding.  A stone may remain in the common bile duct.  A bile leak from the cyst duct that is clipped when your gallbladder is  removed.  The need to convert to open surgery, which requires a larger incision in the abdomen. This may be necessary if your surgeon thinks it is not safe to continue with a laparoscopic procedure. BEFORE THE PROCEDURE  Ask your health care provider about changing or stopping any regular medicines. You will need to stop taking aspirin or blood thinners at least 5 days prior to surgery.  Do not eat or drink anything after midnight the night before surgery.  Let your health care provider know if you develop a cold or other infectious problem before surgery. PROCEDURE   You will be given medicine to make you sleep through the procedure (general anesthetic). A breathing tube will be placed in your mouth.  When you are asleep, your surgeon will make several small cuts (incisions) in your abdomen.  A thin, lighted tube with a tiny camera on the end (laparoscope) is inserted through one of the small incisions. The camera on the laparoscope sends a picture to a TV screen in the operating room. This gives the surgeon a good view inside your abdomen.  A gas will be pumped into your abdomen. This expands your abdomen so that the surgeon has more room to perform the surgery.  Other tools needed for the procedure are inserted through the other incisions. The gallbladder is removed through one of the incisions.  After the removal of your gallbladder, the incisions will be closed with stitches, staples, or skin glue. AFTER THE PROCEDURE  You will be taken to a recovery area where your progress will be checked often.  You may be allowed  to go home the same day if your pain is controlled and you can tolerate liquids. Document Released: 08/13/2005 Document Revised: 06/03/2013 Document Reviewed: 03/25/2013 [facility] Patient Information 2014 [place].

## 2013-09-10 NOTE — Progress Notes (Signed)
OT Cancellation Note  Patient Details Name: [name] MRN: [id number] DOB: May 19, 1934   Cancelled Treatment:    Reason Eval/Treat Not Completed: Other (comment)  Pt just back to bed and didn't feel like getting up again.  Will check another time.    Vennela Jutte 09/10/2013, 12:42 PM Lesle Chris, OTR/L [phone] 09/10/2013

## 2013-09-10 NOTE — Discharge Summary (Addendum)
Physician Discharge Summary  [name] CBJ:[id number] DOB: [birth year] DOA: 09/01/2013  PCP: Gennette Pac, MD  Admit date: 09/01/2013 Discharge date: 09/26/2013  Recommendations for Outpatient Follow-up:  1. Home health PT/OT 2. General surgery in 1 week to reevaluate right upper quadrant drain 3. PCP in 1 week to check blood pressure and weight and advise patient about possibly restarting his ACEI, norvasc, and diuretic.   4. INR on Monday  Discharge Diagnoses:  Principal Problem:   Acute NSTEMI (non-ST elevated myocardial infarction) Active Problems:   HYPERTENSION   CAD   Atrial fibrillation   AORTIC VALVE REPLACEMENT, HX OF   Chronic diastolic heart failure   Abdominal pain, other specified site   Constipation   Hypotension   Leukocytosis   Normocytic anemia   Hyponatremia   Acalculous cholecystitis   Chronic calculous cholecystitis   Discharge Condition: stable, improved  Diet recommendation: healthy heart  Wt Readings from Last 3 Encounters:  09/16/13 101.606 kg (224 lb)  09/07/13 102.105 kg (225 lb 1.6 oz)  09/07/13 102.105 kg (225 lb 1.6 oz)    History of present illness:  The patient is a 80 y.o. 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 Pt is a vague historian  The patient was last at their baseline health 2 weeks ago. He is normal ambulatory without assist device and lives at home with wife. He started having episodes of 4/10 difficult to characterize pain of bilateral upper quadrants without radiation to the back that would last a few seconds and then resolve. This was not associated with any shortness of breath, diaphoresis. The episodes have not become more severe, however they have become more frequent. They are brought on by movement and by people pressing  on his belly. He is also had some nausea which has gotten progressively worse bed mostly responded to antacids. He had very poor appetite for the last 3 days. He denies fevers and chills. He is normally very regular with his bowel movements, but he has not had a bowel movement in 4 days. 4 days ago, he woke up to week to get out of bed. He has been assisted somewhat by his wife to transfer to chair and bathroom today, however, he was unable to even get out of bed. He slid to the floor it required assistance of other family members to get him back to the chair and into the bed. He denies chest pain, orthopnea, PND, increased lower terminates swelling. He denies skin infection, dysuria, cough, wheezing.   In the ER, VS were initially stable 109/52, T 99.27F, P 86, 95% on RA. WBC 11.7, hgb 12.2, plt 152, Na 131, chloride 92, INR 2.83. Troponin 0.47, ECG a-fib, no acute ischemic changes. Pt seen by cardiology who believes this is related to demand ischemia. CXR without acute infiltrate. UA neg. [place] abd demonstrated thickened gallbladder wall with sludge but no evidence of acute cholecystitis. Report suggests findings may be related to chronic cholecystitis.    Hospital Course:   Acute NSTEMI in patient with known CAD.  [name] has mildly elevated troponins and was seen by cardiology when they remained persistently mildly elevated.  These were initially attributed to demand ischemia (type 2 NSTEMI) due to his underlying gallbladder disease, however, his stress test was abnormal, demonstrating reversible ischemia of the mid to  basal septum and adjacent inferoseptal wall. His ejection fraction was preserved at 65%.  He underwent subsequent cardiac catheterization which demonstrated widely patent grafts but an area of potential ischemia of the third obtuse marginal territory. He was started on aspirin, statin, and beta blocker. He he will need to followup with cardiology routinely as an outpatient. He was cleared  for surgery on January 13.  Chronic acalculous cholecystitis:  Abdominal ultrasound at the time of admission demonstrated some gallbladder wall thickening and evidence of possible chronic cholecystitis, however the patient did not have consistent or severe right upper quadrant pain. He was seen by general surgery and eventually underwent laparoscopic cholecystectomy on January 13 by Doctor Rosenbower.  He tolerated his surgery well. Perioperatively he was restarted on a heparin infusion secondary to his history of atrial fibrillation and also mechanical aortic valve. He will need to be bridged to therapeutic Coumadin with Lovenox.  He has a drain in place in the RUQ which will remain in place until he follows up with general surgery next week.  He has a mild residual transaminitis which can be followed by his PCP in 1 month with repeat CMP.    Permanent atrial fibrillation with mechanical aortic valve. Will bridge to therapeutic Coumadin with low molecular weight heparin.  Echocardiogram demonstrated ejection fraction of 55-60% and a well-functioning AV prosthesis.  Telemetry demonstrated rate-controlled atrial fibrillation.  He continued beta blocker and digoxin.    Hypertension, elevated blood pressures postoperatively were likely secondary to pain. He continued carvedilol and his blood pressures gradually trended down.  His ACEI and norvasc have been held and should be restarted in 1-2 weeks if clinically indicated.  His torsemide has also been held, however, he is advised to weight himself daily and if he gains more than 3-lbs in 1 day or 5-lbs in 1 week, or if he notices lower extremity swelling, he should resume this medication.  Progressive generalized weakness, likely secondary to his recent heart attack and also to his chronic cholecystitis. He was evaluated by physical therapy who recommended home health physical therapy. His cortisol level was 14 and his TSH was 0.875.  GERD, stable. Continued  PPI.  Cytosis, a likely secondary to cholecystitis and STEMI and resolved with antibiotics.  Acute blood loss anemia and anemia of chronic disease, hemoglobin stable. Defer further evaluation to his primary care doctor.  Consultants:  General surgery, Dr. Zella Richer  Cardiology, Drs. Daneen Schick, Allen  Procedures:  CXR  Lap chole with intraoperative cholangiogram 09/08/2013  HIDA 09/03/2013  Abd [place] 09/01/2013  Ct abd/pelvis 1/6/015  Cardiac catheterization 09/07/2013  NM stress 09/05/2013 Antibiotics:  Zosyn 09/02/13>>> 09/09/2013  Discharge Exam: Filed Vitals:   09/10/13 1327  BP: 133/68  Pulse: 78  Temp: 98.5 F (36.9 C)  Resp: 16   Filed Vitals:   09/09/13 1403 09/09/13 2122 09/10/13 0500 09/10/13 1327  BP: 143/64 123/55 121/60 133/68  Pulse: 83 79 77 78  Temp: 98.5 F (36.9 C) 98.4 F (36.9 C) 98.5 F (36.9 C) 98.5 F (36.9 C)  TempSrc: Oral Oral Oral Oral  Resp: 20 20 18 16   Height:      Weight:      SpO2: 95% 98% 99% 97%    General: CM, No acute distress  HEENT: NCAT, MMM  Cardiovascular: RRR, nl S1, mechanical S2, 1/6 systolic murmur LSB, no rubs or gallops, 2+ pulses, warm extremities  Respiratory: CTAB, no increased WOB  Abdomen: NABS, soft, TTP diffusely without rebound or guarding.  Port dressings c/d/i. Drain with serous material  MSK: Normal tone and bulk, no LEE  Neuro: Grossly intact   Discharge Instructions      Discharge Orders   Future Appointments Provider Department Dept Phone   10/19/2013 8:00 AM Josue Hector, MD [facility] Office [phone]   Future Orders Complete By Expires   Call MD for:  difficulty breathing, headache or visual disturbances  As directed    Call MD for:  extreme fatigue  As directed    Call MD for:  hives  As directed    Call MD for:  persistant dizziness or light-headedness  As directed    Call MD for:  persistant nausea and vomiting  As directed    Call MD for:  redness, tenderness, or  signs of infection (pain, swelling, redness, odor or green/yellow discharge around incision site)  As directed    Call MD for:  severe uncontrolled pain  As directed    Call MD for:  temperature >100.4  As directed    Diet general  As directed    Discharge instructions  As directed    Comments:     You were hospitalized with heart attack and with gallbladder infection.  You underwent cardiac catheterization which showed that one small blood vessel may be blocked, however, the main blood vessels were open.  You should continue taking aspirin, carvedilol, and cholesterol medications to help reduce your risk of heart attacks in the future.  Your gallbladder has been removed and you will have a drain left in place until you follow up with general surgery next week.  Your coumadin was held and you were started on heparin while in the hospital to thin your blood.  You will need to restart lovenox until your INR has been 2-3 for at least 24 hours.  Please resume your coumadin as before and have your INR repeated on Monday.  Lovenox injections should be taken once daily around the same time each day.  Your blood pressures have been a little low and you have not been eating and drinking normally, so please stop your ramipril, amlodipine, and torsemide for now.  Please ask your primary care doctor to reevaluate these medications at your follow up appointment.  They may need to be restarted. If you gain more than 3-lbs in one day or 5-lbs in one week or if you notice ankle swelling more than usual, you may restart your torsemide before your doctor appointment.   Discharge wound care:  As directed    Comments:     Daily and as needed dry drain dressing to JP drain site.  Other dressings may be removed in 24-48 hours.  Please do not soak in bathtub or submerge incision sites unless okayed by your doctor.  Okay to shower.   Increase activity slowly  As directed        Medication List    STOP taking these  medications       amLODipine 5 MG tablet  Commonly known as:  NORVASC     ramipril 5 MG tablet  Commonly known as:  ALTACE     torsemide 10 MG tablet  Commonly known as:  DEMADEX      TAKE these medications       acetaminophen 325 MG tablet  Commonly known as:  TYLENOL  Take 650 mg by mouth every 6 (six) hours as needed for mild pain.     aspirin 81 MG EC tablet  Take 1 tablet (81 mg total) by mouth daily.     atorvastatin 40 MG tablet  Commonly known as:  LIPITOR  Take 40 mg by mouth daily.     bisacodyl 10 MG suppository  Commonly known as:  DULCOLAX  Place 1 suppository (10 mg total) rectally daily as needed for moderate constipation.     carvedilol 25 MG tablet  Commonly known as:  COREG  Take 25 mg by mouth 2 (two) times daily with a meal.     digoxin 0.125 MG tablet  Commonly known as:  LANOXIN  Take 1 tablet (0.125 mg total) by mouth daily.     enoxaparin 150 MG/ML injection  Commonly known as:  LOVENOX  Inject 1 mL (150 mg total) into the skin daily.     FLOMAX 0.4 MG Caps capsule  Generic drug:  tamsulosin  Take 0.4 mg by mouth daily.     HYDROcodone-acetaminophen 5-325 MG per tablet  Commonly known as:  NORCO/VICODIN  Take 1-2 tablets by mouth every 4 (four) hours as needed for moderate pain.     multivitamin with minerals Tabs tablet  Take 1 tablet by mouth daily.     nitroGLYCERIN 0.4 MG SL tablet  Commonly known as:  NITROSTAT  Place 1 tablet (0.4 mg total) under the tongue every 5 (five) minutes as needed. For chest pain     polyethylene glycol packet  Commonly known as:  MIRALAX / GLYCOLAX  Take 17 g by mouth daily as needed for mild constipation.     potassium chloride 8 MEQ CR tablet  Commonly known as:  KLOR-CON  Take 8 mEq by mouth daily.     ranitidine 150 MG tablet  Commonly known as:  ZANTAC  Take 150 mg by mouth 2 (two) times daily as needed for heartburn.     senna 8.6 MG Tabs tablet  Commonly known as:  SENOKOT  Take 1  tablet (8.6 mg total) by mouth 2 (two) times daily.     solifenacin 10 MG tablet  Commonly known as:  VESICARE  Take 10 mg by mouth daily.     vitamin B-12 1000 MCG tablet  Commonly known as:  CYANOCOBALAMIN  Take 1,000 mcg by mouth daily.     warfarin 5 MG tablet  Commonly known as:  COUMADIN  Take 7.5-10 mg by mouth daily. He takes one and a half tablets on Monday, Wednesday, Friday and two tablets the rest of the week.       Follow-up Information   Follow up with Odis Hollingshead, MD On 09/16/2013. (Your appointment is at 10:20 AM, be at the office 30 minutes early for check in.)    Specialty:  General Surgery   Contact information:   [address] [place] [address] [phone]       Follow up with Gennette Pac, MD. Schedule an appointment as soon as possible for a visit in 1 week.   Specialty:  Family Medicine   Contact information:   [place] [address] [phone]       Follow up with Sinclair Grooms, MD. Schedule an appointment as soon as possible for a visit in 1 month.   Specialty:  Cardiology   Contact information:   [id number] [place] [address] [phone]        The results of significant diagnostics from this hospitalization (including imaging, microbiology, ancillary and laboratory) are listed below for reference.  Significant Diagnostic Studies: Dg Chest 2 View  09/01/2013   CLINICAL DATA:  Weakness. Atrial fibrillation. High blood pressure. Renal cell carcinoma. Prior smoker.  EXAM: CHEST  2 VIEW  COMPARISON:  06/16/2012.  FINDINGS: Post median sternotomy with fracture of upper sternal wires. Post CABG. Heart size top-normal. Calcified mildly tortuous aorta.  T12 compression fracture with prior cement augmentation unchanged. No new compression fracture detected.  No infiltrate, congestive heart failure or pneumothorax.  No plain film evidence of pulmonary malignancy/metastatic  disease.  IMPRESSION: Post median sternotomy with fracture of upper sternal wires. Post CABG. Heart size top-normal.  Calcified mildly tortuous aorta.  T12 compression fracture with prior cement augmentation unchanged. No new compression fracture detected.  No infiltrate, congestive heart failure or pneumothorax.  No plain film evidence of pulmonary malignancy/metastatic disease.   Electronically Signed   By: Bridgett Larsson M.D.   On: 09/01/2013 12:22   Dg Cholangiogram Operative  09/08/2013   CLINICAL DATA:  Cholecystitis  EXAM: INTRAOPERATIVE CHOLANGIOGRAM  TECHNIQUE: Cholangiographic images from the C-arm fluoroscopic device were submitted for interpretation post-operatively. Please see the procedural report for the amount of contrast and the fluoroscopy time utilized.  COMPARISON:  None.  FINDINGS: No persistent filling defects in the common duct. Intrahepatic ducts are incompletely visualized, appearing decompressed centrally. Contrast passes into the duodenum.  : Negative for retained common duct stone.   Electronically Signed   By: Oley Balm M.D.   On: 09/08/2013 14:34   Nm Hepatobiliary Liver Func  09/03/2013   CLINICAL DATA:  Right upper quadrant pain  EXAM: NUCLEAR MEDICINE HEPATOBILIARY IMAGING  TECHNIQUE: Sequential images of the abdomen were obtained out to 60 minutes following intravenous administration of radiopharmaceutical.  COMPARISON:  None.  RADIOPHARMACEUTICALS:  5.41mCi Tc-68m Choletec  FINDINGS: Following the intravenous administration of the radiopharmaceutical there is uniform tracer uptake with clearance from blood pool. Tracer activity within the common bile duct and small bowel loops noted by 15 min. After 60 min no activity is noted within the gallbladder. The patient was given 4 mg of morphine sulfate, IV and imaging was carried out for an additional 30 min. 15 min after administering morphine radiotracer began accumulating within the gallbladder.  IMPRESSION: 1. Patent cystic  duct without evidence for acute cholecystitis. 2. Delayed gallbladder filling compatible with chronic cholecystitis.   Electronically Signed   By: Signa Kell M.D.   On: 09/03/2013 13:11   US Abdomen Complete  09/01/2013   CLINICAL DATA:  Right upper quadrant pain  EXAM: ULTRASOUND ABDOMEN COMPLETE  COMPARISON:  None.  FINDINGS: Gallbladder:  There is mild gallbladder wall thickening to 6 mm. No pericholecystic fluid. There is dependent sludge within the gallbladder. Negative sonographic Murphy's sign.  Common bile duct:  Diameter: Normal diameter 4 mm.  Liver:  No focal lesion identified. Within normal limits in parenchymal echogenicity.  IVC:  No abnormality visualized.  Pancreas:  Visualized portion unremarkable.  Spleen:  Size and appearance within normal limits.  Right Kidney: 12.4cm in length. No evidence of hydronephrosis or stones. Anechoic 1.5 cm cyst in the upper pole. This is proteinaceous nonenhancing cyst on comparison CT.  Left Kidney:  Length: 12.7. Echogenicity within normal limits. No mass or hydronephrosis visualized.  Abdominal aorta:  No aneurysm visualized.  IMPRESSION:  Gallbladder sludge and gallbladder wall thickening without evidence of acute cholecystitis. The findings could represent chronic cholecystitis.   Electronically Signed   By: Genevive Bi M.D.   On: 09/01/2013 12:06   Ct Abdomen  Pelvis W Contrast  09/01/2013   CLINICAL DATA:  Mid abdominal discomfort and generalized weakness, history of renal cell malignancy and radiofrequency ablation.  EXAM: CT ABDOMEN AND PELVIS WITH CONTRAST  TECHNIQUE: Multidetector CT imaging of the abdomen and pelvis was performed using the standard protocol following bolus administration of intravenous contrast.  CONTRAST:  142mL OMNIPAQUE IOHEXOL 300 MG/ML  SOLN  COMPARISON:  CT scan of the abdomen dated June 16, 2013.  FINDINGS: The gallbladder is abnormal in appearance. It is mildly distended and exhibits wall thickening and  pericholecystic fluid. No calcified or noncalcified stones are demonstrated. The adjacent liver is normal in density and contour. There may be a trace of intrahepatic ductal dilation. The pancreas is somewhat atrophic and exhibits no evidence of inflammatory change or focal mass or ductal dilation. The stomach is moderately distended with food particles. The spleen is not enlarged. There are no adrenal masses  The kidneys exhibit no evidence of obstruction. There are findings consistent with the previous radiofrequency ablation on the right. There is a small hypodense focus medial to the lower pole of the right kidney which is stable. It is demonstrated on image 52 of series 2. It measures 1.6 by a approximately 1 cm. A 2nd cortically based midpole hypodensity on the right is stable and is most compatible with a cyst. There is a stable 1.5 cm diameter hypodensity in the lateral aspect of the mid to upper pole of the right kidney demonstrated best on image 41 of series 2. In the medial aspect of the mid to upper pole on image 42 of series 2 there is a 1 cm diameter stable appearing hypodensity. On the left there is no evidence of obstruction or parenchymal masses. A subcentimeter hypodensity in the midpole laterally is consistent with a cyst.  On delayed images contrast within the renal collecting systems is within the limits of normal.  The caliber of the abdominal aorta is normal. The periaortic and pericaval regions also appear normal. The partially contrast filled loops of small and large bowel exhibit no evidence of ileus nor of obstruction. A normal calibered, partially contrast-filled, uninflamed-appearing appendix is demonstrated. Within the pelvis the urinary bladder is mildly distended. The prostate gland is mildly enlarged and produces an impression upon the urinary bladder base. The seminal vesicles and prostate gland appear normal. There is a small fat containing left inguinal hernia. There is a tiny fat  containing umbilical hernia.  The right lung base demonstrates very mild subsegmental atelectasis posteriorly. The lumbar vertebral bodies are preserved in height. There is degenerative disc change at multiple lumbar levels. There is partial compression of the body of T12 which appears to have been previously treated with kyphoplasty.  IMPRESSION: 1. The appearance of the gallbladder is abnormal suggesting acalculous cholecystitis. Ultrasound of the gallbladder would be useful. 2. The appearance of both kidneys is stable with no evidence of obstruction. Post ablation changes on the right are present and stable. There are stable hypodensities remaining in the renal cortex of the right kidney. 3. No acute bowel abnormality is demonstrated. 4. There is mild distention of the urinary bladder which may be related to the mildly enlarged prostate gland. Foley catheterization would be useful.   Electronically Signed   By: David  Martinique   On: 09/01/2013 19:08   Nm Myocar Multi W/spect W/wall Motion / Ef  09/05/2013   CLINICAL DATA:  Cholecystitis and history of coronary artery disease with prior CABG. Mildly elevated troponin level.  EXAM:  MYOCARDIAL IMAGING WITH SPECT (REST AND PHARMACOLOGIC-STRESS)  GATED LEFT VENTRICULAR WALL MOTION STUDY  LEFT VENTRICULAR EJECTION FRACTION  TECHNIQUE: Standard myocardial SPECT imaging was performed after resting intravenous injection of 10 mCi Tc-84m sestamibi. Subsequently, intravenous infusion of Lexiscan was performed under the supervision of the Cardiology staff. At peak effect of the drug, 30 mCi Tc-66m sestamibi was injected intravenously and standard myocardial SPECT imaging was performed. Quantitative gated imaging was also performed to evaluate left ventricular wall motion, and estimate left ventricular ejection fraction.  COMPARISON:  None.  FINDINGS: Utilizing gated data, the end-diastolic volume is estimated to be 125 mL and the end systolic volume 44 mL. Calculated  ejection fraction is 65%.  Gated wall motion analysis demonstrates mild septal hypokinesis which is likely secondary to prior CABG. No other wall motion abnormalities are identified.  SPECT imaging shows reversibility at the level of the mid to basal septum extending into the inferoseptal wall and consistent with inducible ischemia. No other fixed or reversible perfusion defects are identified.  IMPRESSION: 1. Reversibility demonstrated at the level of the mid to basal septum and adjacent inferoseptal wall. Findings are consistent with inducible ischemia. No other perfusion defects are identified. 2. Normal left ventricular function with quantitative ejection fraction calculation of 65%. Mild septal hypokinesis can be attributed to prior CABG.   Electronically Signed   By: Aletta Edouard M.D.   On: 09/05/2013 13:40    Microbiology: No results found for this or any previous visit (from the past 240 hour(s)).   Labs: Basic Metabolic Panel: No results found for this basename: NA, K, CL, CO2, GLUCOSE, BUN, CREATININE, CALCIUM, MG, PHOS,  in the last 168 hours Liver Function Tests: No results found for this basename: AST, ALT, ALKPHOS, BILITOT, PROT, ALBUMIN,  in the last 168 hours No results found for this basename: LIPASE, AMYLASE,  in the last 168 hours No results found for this basename: AMMONIA,  in the last 168 hours CBC: No results found for this basename: WBC, NEUTROABS, HGB, HCT, MCV, PLT,  in the last 168 hours Cardiac Enzymes: No results found for this basename: CKTOTAL, CKMB, CKMBINDEX, TROPONINI,  in the last 168 hours BNP: BNP (last 3 results) No results found for this basename: PROBNP,  in the last 8760 hours CBG: No results found for this basename: GLUCAP,  in the last 168 hours  Time coordinating discharge: 45 minutes  Signed:  Shashank Kwasnik  [facility] 09/26/2013, 5:25 PM

## 2013-09-14 ENCOUNTER — Ambulatory Visit (INDEPENDENT_AMBULATORY_CARE_PROVIDER_SITE_OTHER): Payer: Medicare Other | Admitting: Pharmacist

## 2013-09-14 DIAGNOSIS — Z7901 Long term (current) use of anticoagulants: Secondary | ICD-10-CM

## 2013-09-14 DIAGNOSIS — Z954 Presence of other heart-valve replacement: Secondary | ICD-10-CM

## 2013-09-14 DIAGNOSIS — I359 Nonrheumatic aortic valve disorder, unspecified: Secondary | ICD-10-CM

## 2013-09-14 DIAGNOSIS — I4891 Unspecified atrial fibrillation: Secondary | ICD-10-CM

## 2013-09-14 LAB — POCT INR: INR: 1.3

## 2013-09-16 ENCOUNTER — Encounter (INDEPENDENT_AMBULATORY_CARE_PROVIDER_SITE_OTHER): Payer: Self-pay | Admitting: General Surgery

## 2013-09-16 ENCOUNTER — Ambulatory Visit (INDEPENDENT_AMBULATORY_CARE_PROVIDER_SITE_OTHER): Payer: Medicare Other | Admitting: General Surgery

## 2013-09-16 VITALS — BP 156/88 | HR 72 | Temp 97.6°F | Resp 14 | Ht 75.0 in | Wt 224.0 lb

## 2013-09-16 DIAGNOSIS — Z4889 Encounter for other specified surgical aftercare: Secondary | ICD-10-CM

## 2013-09-16 NOTE — Progress Notes (Signed)
Procedure:  Laparoscopic cholecystectomy with cholangiogram  Date:  09/08/2013  Pathology:  Acute and chronic acalculous cholecystitis  History:  He is here for his first postoperative visit. His son and wife are with him. He is slowly improving. The drain output is mostly serous. He is tolerating his diet. Bowels are moving. No fever or chills. Still a little bit weak. Physical therapy is coming to the house and helping him.  Exam: General- Is in NAD. Abdomen-soft, incision is clean and intact, drain output is serous. Drain was removed and a dressing applied.  Assessment:  Making slow steady progress post cholecystectomy. Drain has been removed.  Plan:  Continue physical therapy. May drive when his strength has returned. Wound care instructions were given to them. Return as needed.

## 2013-09-16 NOTE — Patient Instructions (Signed)
May shower. Apply a dry dressing over the drain site daily until it is healed. Call if you have any problems with the incisions.

## 2013-09-18 ENCOUNTER — Ambulatory Visit (INDEPENDENT_AMBULATORY_CARE_PROVIDER_SITE_OTHER): Payer: Medicare Other | Admitting: Pharmacist

## 2013-09-18 DIAGNOSIS — Z7901 Long term (current) use of anticoagulants: Secondary | ICD-10-CM

## 2013-09-18 DIAGNOSIS — Z954 Presence of other heart-valve replacement: Secondary | ICD-10-CM

## 2013-09-18 DIAGNOSIS — I4891 Unspecified atrial fibrillation: Secondary | ICD-10-CM

## 2013-09-18 DIAGNOSIS — I359 Nonrheumatic aortic valve disorder, unspecified: Secondary | ICD-10-CM

## 2013-09-18 LAB — POCT INR: INR: 1.9

## 2013-09-25 ENCOUNTER — Ambulatory Visit (INDEPENDENT_AMBULATORY_CARE_PROVIDER_SITE_OTHER): Payer: Medicare Other | Admitting: Pharmacist

## 2013-09-25 DIAGNOSIS — Z7901 Long term (current) use of anticoagulants: Secondary | ICD-10-CM

## 2013-09-25 DIAGNOSIS — Z954 Presence of other heart-valve replacement: Secondary | ICD-10-CM

## 2013-09-25 DIAGNOSIS — I359 Nonrheumatic aortic valve disorder, unspecified: Secondary | ICD-10-CM

## 2013-09-25 DIAGNOSIS — I4891 Unspecified atrial fibrillation: Secondary | ICD-10-CM

## 2013-09-25 LAB — POCT INR: INR: 1.4

## 2013-09-26 DIAGNOSIS — I214 Non-ST elevation (NSTEMI) myocardial infarction: Secondary | ICD-10-CM

## 2013-10-02 ENCOUNTER — Ambulatory Visit: Payer: Self-pay | Admitting: Pharmacist

## 2013-10-02 DIAGNOSIS — I4891 Unspecified atrial fibrillation: Secondary | ICD-10-CM

## 2013-10-02 DIAGNOSIS — I359 Nonrheumatic aortic valve disorder, unspecified: Secondary | ICD-10-CM

## 2013-10-02 DIAGNOSIS — Z7901 Long term (current) use of anticoagulants: Secondary | ICD-10-CM

## 2013-10-02 DIAGNOSIS — Z954 Presence of other heart-valve replacement: Secondary | ICD-10-CM

## 2013-10-02 LAB — POCT INR: INR: 2.1

## 2013-10-02 MED ORDER — WARFARIN SODIUM 5 MG PO TABS
ORAL_TABLET | ORAL | Status: DC
Start: 2013-10-02 — End: 2014-01-09

## 2013-10-08 ENCOUNTER — Other Ambulatory Visit: Payer: Self-pay

## 2013-10-08 MED ORDER — TORSEMIDE 10 MG PO TABS: 10.0000 mg | ORAL_TABLET | Freq: Two times a day (BID) | ORAL | Status: DC

## 2013-10-10 ENCOUNTER — Telehealth: Payer: Self-pay | Admitting: Cardiology

## 2013-10-10 NOTE — Telephone Encounter (Signed)
Home health nurse called stating that the patient's INR today was 4.8. Goal is 2-3. I instructed nurse to have patient hold Coumadin for today and tomorrow and to recheck Monday morning and send result to our [facility] office. RN instructed to notify patient to report any falls or abnormal bleeding. She voiced understanding.   Lyda Jester, PA-C

## 2013-10-12 ENCOUNTER — Telehealth: Payer: Self-pay | Admitting: Cardiovascular Disease

## 2013-10-12 LAB — POCT INR: INR: 2.2

## 2013-10-12 NOTE — Telephone Encounter (Signed)
Called spoke with pt advised [facility] RN is scheduled to recheck INR today and we will dose Coumadin based on that result.  Await call back with dosage instructions once result received.

## 2013-10-12 NOTE — Telephone Encounter (Signed)
New message     Pt has been off coumadin for 2 days because [facility] said his pt/inr was too high.  They do not know when the nurse is coming back.  How long can he be off coumadin?

## 2013-10-14 ENCOUNTER — Ambulatory Visit (INDEPENDENT_AMBULATORY_CARE_PROVIDER_SITE_OTHER): Payer: Medicare Other | Admitting: Pharmacist

## 2013-10-14 DIAGNOSIS — I4891 Unspecified atrial fibrillation: Secondary | ICD-10-CM

## 2013-10-14 DIAGNOSIS — I359 Nonrheumatic aortic valve disorder, unspecified: Secondary | ICD-10-CM

## 2013-10-14 DIAGNOSIS — Z954 Presence of other heart-valve replacement: Secondary | ICD-10-CM

## 2013-10-14 DIAGNOSIS — Z7901 Long term (current) use of anticoagulants: Secondary | ICD-10-CM

## 2013-10-19 ENCOUNTER — Ambulatory Visit (INDEPENDENT_AMBULATORY_CARE_PROVIDER_SITE_OTHER): Payer: Medicare Other | Admitting: Cardiovascular Disease

## 2013-10-19 ENCOUNTER — Encounter: Payer: Self-pay | Admitting: Cardiovascular Disease

## 2013-10-19 VITALS — BP 113/65 | HR 80 | Ht 76.0 in | Wt 228.0 lb

## 2013-10-19 DIAGNOSIS — I2510 Atherosclerotic heart disease of native coronary artery without angina pectoris: Secondary | ICD-10-CM

## 2013-10-19 MED ORDER — CARVEDILOL 25 MG PO TABS: 25.0000 mg | ORAL_TABLET | Freq: Two times a day (BID) | ORAL | Status: DC

## 2013-10-19 MED ORDER — POTASSIUM CHLORIDE ER 8 MEQ PO TBCR: 8.0000 meq | EXTENDED_RELEASE_TABLET | Freq: Every day | ORAL | Status: DC

## 2013-10-19 MED ORDER — TORSEMIDE 10 MG PO TABS: 10.0000 mg | ORAL_TABLET | Freq: Two times a day (BID) | ORAL | Status: DC

## 2013-10-19 NOTE — Assessment & Plan Note (Signed)
Stable with no angina and good activity level.  Continue medical Rx Cath 1/15 patent grafts  On ASA 81 mg due to coumadin

## 2013-10-19 NOTE — Assessment & Plan Note (Signed)
Well controlled.  Continue current medications and low sodium Dash type diet.   Continue to hold norvasc

## 2013-10-19 NOTE — Assessment & Plan Note (Signed)
Good rate control and anticoagulation F/U INR in clinic 3 weeks appt made

## 2013-10-19 NOTE — Assessment & Plan Note (Signed)
S/P cholecystectomy  F/u Dr Zella Richer Wounds healed  Eating well

## 2013-10-19 NOTE — Patient Instructions (Signed)
Your physician wants you to follow-up in:  [place] will receive a reminder letter in the mail two months in advance. If you don't receive a letter, please call our office to schedule the follow-up appointment. Your physician recommends that you continue on your current medications as directed. Please refer to the Current Medication list given to you today. COUMADIN  CLINIC  IN  3 WEEKS

## 2013-10-19 NOTE — Progress Notes (Signed)
Patient ID: [name], male   DOB: 09/26/1933, 80 y.o.   MRN: [id number] He has a history of CAD, Aortic stenosis, status post CABG and St. Jude aortic valve replacement 05/2002, permanent atrial fibrillation, hypertension, hyperlipidemia. Last nuclear study 04/2010: Low-risk study with mild fixed basal to mid inferior perfusion defect defect representing diaphragmatic attenuation versus prior infarct, no ischemia. Last echo 04/2010: Mild LVH, EF 51%, diastolic dysfunction indeterminate, mechanical aortic valve prosthesis, no significant AI, mean gradient 18, borderline dilated aortic root, moderate LAE, mild RAE, PASP 30.  Saw PA in June and amlodipine added for elevated BP. Improved TSH and BMET ordered and normal Memory has gotten quite poor  Recently hospitalized for abdominal pain  Troponins elevated no acute ECG changes EF 65% by echo  Cath done prior to surgery  OK and medical Rx recommended   IMPRESSIONS: 1. Widely patent saphenous vein grafts as noted above. Graft to the LAD via the internal mammary is also widely patent.  2. 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  3. Potential ischemia in the third obtuse marginal territory as flow is compromised by obstruction in the second obtuse marginal proximal to the vein graft and the 80% stenosis in the mid circumflex. This territory is relatively small and should be treatable with medical therapy. If refractory angina, stenting of the mid circumflex would resolve this problem. This does not seem prudent in this situation given the need for general surgery and the absence of symptoms.  RECOMMENDATION: Proceed with surgery on the patient's gallbladder. The only territory that is potentially ischemic is in the third obtuse marginal which has 2 sources of flow as noted above. Continue low-dose beta blocker therapy. Surgical risks should be low to  moderate.  Subsequently had cholecystectomy by Dr Zella Richer no issues     ROS: Denies fever, malais, weight loss, blurry vision, decreased visual acuity, cough, sputum, SOB, hemoptysis, pleuritic pain, palpitaitons, heartburn, abdominal pain, melena, lower extremity edema, claudication, or rash.  All other systems reviewed and negative  General: Affect appropriate Healthy:  appears stated age 45: normal Neck supple with no adenopathy JVP normal no bruits no thyromegaly Lungs clear with no wheezing and good diaphragmatic motion Heart:  O8/C1 click no AR  murmur, no rub, gallop or click PMI normal Abdomen: benighn, BS positve, no tenderness, no AAA no bruit.  No HSM or HJR Distal pulses intact with no bruits No edema Neuro non-focal Skin warm and dry No muscular weakness   Current Outpatient Prescriptions  Medication Sig Dispense Refill  . acetaminophen (TYLENOL) 325 MG tablet Take 650 mg by mouth every 6 (six) hours as needed for mild pain.       [name] amoxicillin (AMOXIL) 500 MG capsule Take 500 mg by mouth as needed.      [name] aspirin EC 81 MG EC tablet Take 1 tablet (81 mg total) by mouth daily.  30 tablet  0  . bisacodyl (DULCOLAX) 10 MG suppository Place 1 suppository (10 mg total) rectally daily as needed for moderate constipation.  12 suppository  0  . carvedilol (COREG) 25 MG tablet Take 25 mg by mouth 2 (two) times daily with a meal.      . cholecalciferol (VITAMIN D) 1000 UNITS tablet Take 1,000 Units by mouth daily.      . digoxin (LANOXIN) 0.125 MG tablet Take 1 tablet (0.125 mg total) by mouth daily.  90 tablet  2  . famotidine (PEPCID) 20 MG tablet Take 20 mg by mouth daily.      . Multiple Vitamin (MULTIVITAMIN WITH MINERALS) TABS tablet Take 1 tablet by mouth daily.      . nitroGLYCERIN (NITROSTAT) 0.4 MG SL tablet Place 1 tablet (0.4 mg total) under the tongue every 5 (five) minutes as needed. For chest pain  25 tablet  4  . polyethylene glycol (MIRALAX / GLYCOLAX)  packet Take 17 g by mouth daily as needed for mild constipation.  14 each  0  . potassium chloride (KLOR-CON) 8 MEQ CR tablet Take 8 mEq by mouth daily.       . ramipril (ALTACE) 5 MG capsule Take 5 mg by mouth daily.      . ranitidine (ZANTAC) 150 MG tablet Take 150 mg by mouth 2 (two) times daily as needed for heartburn.       . solifenacin (VESICARE) 10 MG tablet Take 10 mg by mouth daily.       . Tamsulosin HCl (FLOMAX) 0.4 MG CAPS Take 0.4 mg by mouth daily.       [name] torsemide (DEMADEX) 10 MG tablet Take 10 mg by mouth 2 (two) times daily.      . vitamin B-12 (CYANOCOBALAMIN) 1000 MCG tablet Take 1,000 mcg by mouth daily.        [name] warfarin (COUMADIN) 5 MG tablet Take as directed by Anticoagulation clinic  180 tablet  0   No current facility-administered medications for this visit.    Allergies  Review of patient's allergies indicates no known allergies.  Electrocardiogram:  afib rate 74 otherwise normal   Assessment and Plan

## 2013-10-21 ENCOUNTER — Ambulatory Visit (INDEPENDENT_AMBULATORY_CARE_PROVIDER_SITE_OTHER): Payer: Medicare Other | Admitting: Pharmacist

## 2013-10-21 DIAGNOSIS — Z954 Presence of other heart-valve replacement: Secondary | ICD-10-CM

## 2013-10-21 DIAGNOSIS — I4891 Unspecified atrial fibrillation: Secondary | ICD-10-CM

## 2013-10-21 DIAGNOSIS — I359 Nonrheumatic aortic valve disorder, unspecified: Secondary | ICD-10-CM

## 2013-10-21 DIAGNOSIS — Z7901 Long term (current) use of anticoagulants: Secondary | ICD-10-CM

## 2013-10-21 LAB — POCT INR: INR: 1.9

## 2013-11-06 ENCOUNTER — Ambulatory Visit (INDEPENDENT_AMBULATORY_CARE_PROVIDER_SITE_OTHER): Payer: Medicare Other | Admitting: *Deleted

## 2013-11-06 DIAGNOSIS — Z954 Presence of other heart-valve replacement: Secondary | ICD-10-CM

## 2013-11-06 DIAGNOSIS — Z5181 Encounter for therapeutic drug level monitoring: Secondary | ICD-10-CM

## 2013-11-06 DIAGNOSIS — I359 Nonrheumatic aortic valve disorder, unspecified: Secondary | ICD-10-CM

## 2013-11-06 DIAGNOSIS — I4891 Unspecified atrial fibrillation: Secondary | ICD-10-CM

## 2013-11-06 DIAGNOSIS — Z7901 Long term (current) use of anticoagulants: Secondary | ICD-10-CM

## 2013-11-06 LAB — POCT INR: INR: 3.5

## 2013-11-20 ENCOUNTER — Ambulatory Visit (INDEPENDENT_AMBULATORY_CARE_PROVIDER_SITE_OTHER): Payer: Medicare Other | Admitting: Pharmacist

## 2013-11-20 DIAGNOSIS — Z7901 Long term (current) use of anticoagulants: Secondary | ICD-10-CM

## 2013-11-20 DIAGNOSIS — I4891 Unspecified atrial fibrillation: Secondary | ICD-10-CM

## 2013-11-20 DIAGNOSIS — I359 Nonrheumatic aortic valve disorder, unspecified: Secondary | ICD-10-CM

## 2013-11-20 DIAGNOSIS — Z954 Presence of other heart-valve replacement: Secondary | ICD-10-CM

## 2013-11-20 DIAGNOSIS — Z5181 Encounter for therapeutic drug level monitoring: Secondary | ICD-10-CM

## 2013-11-20 LAB — POCT INR: INR: 3.1

## 2013-12-09 ENCOUNTER — Ambulatory Visit (INDEPENDENT_AMBULATORY_CARE_PROVIDER_SITE_OTHER): Payer: Medicare Other | Admitting: *Deleted

## 2013-12-09 DIAGNOSIS — Z5181 Encounter for therapeutic drug level monitoring: Secondary | ICD-10-CM

## 2013-12-09 DIAGNOSIS — I359 Nonrheumatic aortic valve disorder, unspecified: Secondary | ICD-10-CM

## 2013-12-09 DIAGNOSIS — Z7901 Long term (current) use of anticoagulants: Secondary | ICD-10-CM

## 2013-12-09 DIAGNOSIS — Z954 Presence of other heart-valve replacement: Secondary | ICD-10-CM

## 2013-12-09 DIAGNOSIS — I4891 Unspecified atrial fibrillation: Secondary | ICD-10-CM

## 2013-12-09 LAB — POCT INR: INR: 2.3

## 2013-12-31 ENCOUNTER — Ambulatory Visit (INDEPENDENT_AMBULATORY_CARE_PROVIDER_SITE_OTHER): Payer: Medicare Other | Admitting: *Deleted

## 2013-12-31 DIAGNOSIS — Z7901 Long term (current) use of anticoagulants: Secondary | ICD-10-CM

## 2013-12-31 DIAGNOSIS — I4891 Unspecified atrial fibrillation: Secondary | ICD-10-CM

## 2013-12-31 DIAGNOSIS — Z954 Presence of other heart-valve replacement: Secondary | ICD-10-CM

## 2013-12-31 DIAGNOSIS — I359 Nonrheumatic aortic valve disorder, unspecified: Secondary | ICD-10-CM

## 2013-12-31 DIAGNOSIS — Z5181 Encounter for therapeutic drug level monitoring: Secondary | ICD-10-CM

## 2013-12-31 LAB — POCT INR: INR: 2.9

## 2014-01-09 ENCOUNTER — Other Ambulatory Visit: Payer: Self-pay | Admitting: Cardiovascular Disease

## 2014-01-11 ENCOUNTER — Other Ambulatory Visit: Payer: Self-pay | Admitting: *Deleted

## 2014-01-11 MED ORDER — WARFARIN SODIUM 5 MG PO TABS: ORAL_TABLET | ORAL | Status: DC

## 2014-01-28 ENCOUNTER — Ambulatory Visit (INDEPENDENT_AMBULATORY_CARE_PROVIDER_SITE_OTHER): Payer: Medicare Other

## 2014-01-28 DIAGNOSIS — Z954 Presence of other heart-valve replacement: Secondary | ICD-10-CM

## 2014-01-28 DIAGNOSIS — Z5181 Encounter for therapeutic drug level monitoring: Secondary | ICD-10-CM

## 2014-01-28 DIAGNOSIS — I359 Nonrheumatic aortic valve disorder, unspecified: Secondary | ICD-10-CM

## 2014-01-28 DIAGNOSIS — Z7901 Long term (current) use of anticoagulants: Secondary | ICD-10-CM

## 2014-01-28 DIAGNOSIS — I4891 Unspecified atrial fibrillation: Secondary | ICD-10-CM

## 2014-01-28 LAB — POCT INR: INR: 4.3

## 2014-02-11 ENCOUNTER — Ambulatory Visit (INDEPENDENT_AMBULATORY_CARE_PROVIDER_SITE_OTHER): Payer: Medicare Other

## 2014-02-11 DIAGNOSIS — I4891 Unspecified atrial fibrillation: Secondary | ICD-10-CM

## 2014-02-11 DIAGNOSIS — I359 Nonrheumatic aortic valve disorder, unspecified: Secondary | ICD-10-CM

## 2014-02-11 DIAGNOSIS — Z954 Presence of other heart-valve replacement: Secondary | ICD-10-CM

## 2014-02-11 DIAGNOSIS — Z7901 Long term (current) use of anticoagulants: Secondary | ICD-10-CM

## 2014-02-11 DIAGNOSIS — Z5181 Encounter for therapeutic drug level monitoring: Secondary | ICD-10-CM

## 2014-02-11 LAB — POCT INR: INR: 2.4

## 2014-03-04 ENCOUNTER — Ambulatory Visit (INDEPENDENT_AMBULATORY_CARE_PROVIDER_SITE_OTHER): Payer: Medicare Other

## 2014-03-04 DIAGNOSIS — I359 Nonrheumatic aortic valve disorder, unspecified: Secondary | ICD-10-CM

## 2014-03-04 DIAGNOSIS — Z954 Presence of other heart-valve replacement: Secondary | ICD-10-CM

## 2014-03-04 DIAGNOSIS — I4891 Unspecified atrial fibrillation: Secondary | ICD-10-CM

## 2014-03-04 DIAGNOSIS — Z7901 Long term (current) use of anticoagulants: Secondary | ICD-10-CM

## 2014-03-04 DIAGNOSIS — Z5181 Encounter for therapeutic drug level monitoring: Secondary | ICD-10-CM

## 2014-03-04 LAB — POCT INR: INR: 1.8

## 2014-03-20 ENCOUNTER — Other Ambulatory Visit: Payer: Self-pay | Admitting: Cardiovascular Disease

## 2014-03-25 ENCOUNTER — Ambulatory Visit (INDEPENDENT_AMBULATORY_CARE_PROVIDER_SITE_OTHER): Payer: Medicare Other | Admitting: Pharmacist

## 2014-03-25 DIAGNOSIS — I4891 Unspecified atrial fibrillation: Secondary | ICD-10-CM

## 2014-03-25 DIAGNOSIS — I359 Nonrheumatic aortic valve disorder, unspecified: Secondary | ICD-10-CM

## 2014-03-25 DIAGNOSIS — Z5181 Encounter for therapeutic drug level monitoring: Secondary | ICD-10-CM

## 2014-03-25 DIAGNOSIS — Z954 Presence of other heart-valve replacement: Secondary | ICD-10-CM

## 2014-03-25 DIAGNOSIS — Z7901 Long term (current) use of anticoagulants: Secondary | ICD-10-CM

## 2014-03-25 LAB — POCT INR: INR: 2.8

## 2014-04-01 ENCOUNTER — Other Ambulatory Visit: Payer: Self-pay | Admitting: Family Medicine

## 2014-04-02 ENCOUNTER — Other Ambulatory Visit: Payer: Self-pay | Admitting: Family Medicine

## 2014-04-02 DIAGNOSIS — R222 Localized swelling, mass and lump, trunk: Secondary | ICD-10-CM

## 2014-04-05 ENCOUNTER — Encounter: Payer: Self-pay | Admitting: Cardiovascular Disease

## 2014-04-05 ENCOUNTER — Ambulatory Visit (INDEPENDENT_AMBULATORY_CARE_PROVIDER_SITE_OTHER): Payer: Medicare Other | Admitting: Cardiovascular Disease

## 2014-04-05 VITALS — BP 140/75 | HR 80 | Ht 76.0 in | Wt 233.0 lb

## 2014-04-05 DIAGNOSIS — I10 Essential (primary) hypertension: Secondary | ICD-10-CM

## 2014-04-05 DIAGNOSIS — I359 Nonrheumatic aortic valve disorder, unspecified: Secondary | ICD-10-CM

## 2014-04-05 DIAGNOSIS — I2510 Atherosclerotic heart disease of native coronary artery without angina pectoris: Secondary | ICD-10-CM

## 2014-04-05 NOTE — Assessment & Plan Note (Signed)
Well controlled.  Continue current medications and low sodium Dash type diet.    

## 2014-04-05 NOTE — Assessment & Plan Note (Signed)
Good rate control and anticoagulation  

## 2014-04-05 NOTE — Progress Notes (Signed)
Patient ID: [name], male   DOB: [birth year], 80 y.o.   MRN: [id number] He has a history of CAD, Aortic stenosis, status post CABG and St. Jude aortic valve replacement 05/2002, permanent atrial fibrillation, hypertension, hyperlipidemia. Last nuclear study 04/2010: Low-risk study with mild fixed basal to mid inferior perfusion defect defect representing diaphragmatic attenuation versus prior infarct, no ischemia.  Saw PA in June and amlodipine added for elevated BP. Improved TSH and BMET ordered and normal Memory has gotten quite poor   Recently hospitalized for abdominal pain Troponins elevated no acute ECG changes EF 65% by echo Cath done prior to surgery 09/07/13  OK and medical Rx recommended   Echo 09/03/13 Study Conclusions  - Left ventricle: The cavity size was normal. Wall thickness was increased in a pattern of mild LVH. Systolic function was normal. The estimated ejection fraction was in the range of 55% to 60%. - Aortic valve: AV prosthesis opens well. Peak and mean gradients through the valve are 29 and 15 mm Hg respectively. Trivial regurgitation. - Mitral valve: Mild regurgitation. - Pulmonary arteries: PA peak pressure: 43mm Hg (S).  Some right knee pain and sub xiphoid prominence   ROS: Denies fever, malais, weight loss, blurry vision, decreased visual acuity, cough, sputum, SOB, hemoptysis, pleuritic pain, palpitaitons, heartburn, abdominal pain, melena, lower extremity edema, claudication, or rash.  All other systems reviewed and negative  General: Affect appropriate Healthy:  appears stated age 15: normal Neck supple with no adenopathy JVP normal no bruits no thyromegaly Lungs clear with no wheezing and good diaphragmatic motion Heart:  H6/W7 click SEM no AR murmur, no rub, gallop or click PMI normal Abdomen: benighn, BS positve, no tenderness, no AAA no bruit.  No HSM or HJR Distal pulses intact with no bruits No edema Neuro non-focal Skin warm and  dry No muscular weakness   Current Outpatient Prescriptions  Medication Sig Dispense Refill  . acetaminophen (TYLENOL) 325 MG tablet Take 650 mg by mouth every 6 (six) hours as needed for mild pain.       [name] amoxicillin (AMOXIL) 500 MG capsule Take 500 mg by mouth as needed.      [name] aspirin EC 81 MG EC tablet Take 1 tablet (81 mg total) by mouth daily.  30 tablet  0  . bisacodyl (DULCOLAX) 10 MG suppository Place 1 suppository (10 mg total) rectally daily as needed for moderate constipation.  12 suppository  0  . carvedilol (COREG) 25 MG tablet Take 1 tablet (25 mg total) by mouth 2 (two) times daily with a meal.  60 tablet  11  . cholecalciferol (VITAMIN D) 1000 UNITS tablet Take 1,000 Units by mouth daily.      . digoxin (LANOXIN) 0.125 MG tablet Take 1 tablet (0.125 mg total) by mouth daily.  90 tablet  2  . famotidine (PEPCID) 20 MG tablet Take 20 mg by mouth daily.      . Multiple Vitamin (MULTIVITAMIN WITH MINERALS) TABS tablet Take 1 tablet by mouth daily.      . nitroGLYCERIN (NITROSTAT) 0.4 MG SL tablet Place 1 tablet (0.4 mg total) under the tongue every 5 (five) minutes as needed. For chest pain  25 tablet  4  . polyethylene glycol (MIRALAX / GLYCOLAX) packet Take 17 g by mouth daily as needed for mild constipation.  14 each  0  . potassium chloride (KLOR-CON) 8 MEQ tablet Take 1 tablet (8 mEq total) by mouth daily.  30 tablet  11  .  ramipril (ALTACE) 5 MG capsule Take 5 mg by mouth daily.      . ranitidine (ZANTAC) 150 MG tablet Take 150 mg by mouth 2 (two) times daily as needed for heartburn.       . solifenacin (VESICARE) 10 MG tablet Take 10 mg by mouth daily.       . Tamsulosin HCl (FLOMAX) 0.4 MG CAPS Take 0.4 mg by mouth daily.       [name] torsemide (DEMADEX) 10 MG tablet Take 1 tablet (10 mg total) by mouth 2 (two) times daily.  60 tablet  11  . vitamin B-12 (CYANOCOBALAMIN) 1000 MCG tablet Take 1,000 mcg by mouth daily.        [name] warfarin (COUMADIN) 5 MG tablet 2 tablets on all  days except 1.5 tablets on  Monday, Wednesday, and Friday or as directed by coumadin clinic  180 tablet  1   No current facility-administered medications for this visit.    Allergies  Review of patient's allergies indicates no known allergies.  Electrocardiogram:  afib rate 74 otherwise normal   Assessment and Plan

## 2014-04-05 NOTE — Assessment & Plan Note (Signed)
Normal functioning AVR  SBE prophylaxis no perivalvular regurgitation by echo 1/15

## 2014-04-05 NOTE — Assessment & Plan Note (Signed)
Stable with no angina and good activity level.  Continue medical Rx  

## 2014-04-05 NOTE — Patient Instructions (Signed)
Your physician wants you to follow-up in: 6 MONTHS WITH DR. NISHAN You will receive a reminder letter in the mail two months in advance. If you don't receive a letter, please call our office to schedule the follow-up appointment.  

## 2014-04-14 ENCOUNTER — Ambulatory Visit
Admission: RE | Admit: 2014-04-14 | Discharge: 2014-04-14 | Disposition: A | Discharge status: Home / Self Care | Payer: Medicare Other | Source: Ambulatory Visit | Attending: Family Medicine | Admitting: Family Medicine

## 2014-04-14 DIAGNOSIS — R222 Localized swelling, mass and lump, trunk: Secondary | ICD-10-CM

## 2014-04-14 MED ORDER — IOHEXOL 300 MG/ML  SOLN
75.0000 mL | Freq: Once | INTRAMUSCULAR | Status: AC | PRN
  Administered 2014-04-14: 75 mL via INTRAVENOUS

## 2014-04-22 ENCOUNTER — Ambulatory Visit (INDEPENDENT_AMBULATORY_CARE_PROVIDER_SITE_OTHER): Payer: Medicare Other

## 2014-04-22 DIAGNOSIS — Z954 Presence of other heart-valve replacement: Secondary | ICD-10-CM

## 2014-04-22 DIAGNOSIS — Z7901 Long term (current) use of anticoagulants: Secondary | ICD-10-CM

## 2014-04-22 DIAGNOSIS — Z5181 Encounter for therapeutic drug level monitoring: Secondary | ICD-10-CM

## 2014-04-22 DIAGNOSIS — I359 Nonrheumatic aortic valve disorder, unspecified: Secondary | ICD-10-CM

## 2014-04-22 DIAGNOSIS — I4891 Unspecified atrial fibrillation: Secondary | ICD-10-CM

## 2014-04-22 LAB — POCT INR: INR: 2.6

## 2014-05-20 ENCOUNTER — Ambulatory Visit (INDEPENDENT_AMBULATORY_CARE_PROVIDER_SITE_OTHER): Payer: Medicare Other | Admitting: *Deleted

## 2014-05-20 DIAGNOSIS — Z954 Presence of other heart-valve replacement: Secondary | ICD-10-CM

## 2014-05-20 DIAGNOSIS — Z5181 Encounter for therapeutic drug level monitoring: Secondary | ICD-10-CM

## 2014-05-20 DIAGNOSIS — I359 Nonrheumatic aortic valve disorder, unspecified: Secondary | ICD-10-CM

## 2014-05-20 DIAGNOSIS — Z7901 Long term (current) use of anticoagulants: Secondary | ICD-10-CM

## 2014-05-20 DIAGNOSIS — I4891 Unspecified atrial fibrillation: Secondary | ICD-10-CM

## 2014-05-20 LAB — POCT INR: INR: 3.7

## 2014-06-01 ENCOUNTER — Other Ambulatory Visit (HOSPITAL_COMMUNITY): Payer: Self-pay | Admitting: Interventional Radiology

## 2014-06-01 DIAGNOSIS — N2889 Other specified disorders of kidney and ureter: Secondary | ICD-10-CM

## 2014-06-04 ENCOUNTER — Ambulatory Visit (INDEPENDENT_AMBULATORY_CARE_PROVIDER_SITE_OTHER): Payer: Medicare Other | Admitting: Pharmacist

## 2014-06-04 DIAGNOSIS — I359 Nonrheumatic aortic valve disorder, unspecified: Secondary | ICD-10-CM

## 2014-06-04 DIAGNOSIS — I4891 Unspecified atrial fibrillation: Secondary | ICD-10-CM

## 2014-06-04 DIAGNOSIS — Z952 Presence of prosthetic heart valve: Secondary | ICD-10-CM

## 2014-06-04 DIAGNOSIS — Z5181 Encounter for therapeutic drug level monitoring: Secondary | ICD-10-CM

## 2014-06-04 DIAGNOSIS — Z954 Presence of other heart-valve replacement: Secondary | ICD-10-CM

## 2014-06-04 DIAGNOSIS — Z7901 Long term (current) use of anticoagulants: Secondary | ICD-10-CM

## 2014-06-04 LAB — POCT INR: INR: 2.2

## 2014-06-15 ENCOUNTER — Encounter: Payer: Self-pay | Admitting: Radiology

## 2014-06-20 ENCOUNTER — Other Ambulatory Visit: Payer: Self-pay | Admitting: Cardiovascular Disease

## 2014-06-22 ENCOUNTER — Other Ambulatory Visit: Payer: Self-pay | Admitting: Radiology

## 2014-06-22 DIAGNOSIS — C641 Malignant neoplasm of right kidney, except renal pelvis: Secondary | ICD-10-CM

## 2014-06-23 LAB — BUN: BUN: 13 mg/dL (ref 6–23)

## 2014-06-23 LAB — CREATININE WITH EST GFR
Creat: 0.95 mg/dL (ref 0.50–1.35)
GFR, EST NON AFRICAN AMERICAN: 76 mL/min
GFR, Est African American: 88 mL/min

## 2014-06-29 ENCOUNTER — Ambulatory Visit
Admission: RE | Admit: 2014-06-29 | Discharge: 2014-06-29 | Disposition: A | Discharge status: Home / Self Care | Payer: Medicare Other | Source: Ambulatory Visit | Attending: Interventional Radiology | Admitting: Interventional Radiology

## 2014-06-29 ENCOUNTER — Ambulatory Visit (HOSPITAL_COMMUNITY)
Admission: RE | Admit: 2014-06-29 | Discharge: 2014-06-29 | Disposition: A | Discharge status: Home / Self Care | Payer: Medicare Other | Source: Ambulatory Visit | Attending: Diagnostic Radiology | Admitting: Diagnostic Radiology

## 2014-06-29 ENCOUNTER — Encounter (HOSPITAL_COMMUNITY): Payer: Self-pay

## 2014-06-29 DIAGNOSIS — N2889 Other specified disorders of kidney and ureter: Secondary | ICD-10-CM

## 2014-06-29 DIAGNOSIS — N281 Cyst of kidney, acquired: Secondary | ICD-10-CM | POA: Diagnosis not present

## 2014-06-29 DIAGNOSIS — Z8553 Personal history of malignant neoplasm of renal pelvis: Secondary | ICD-10-CM | POA: Diagnosis not present

## 2014-06-29 MED ORDER — IOHEXOL 300 MG/ML  SOLN
100.0000 mL | Freq: Once | INTRAMUSCULAR | Status: AC | PRN
  Administered 2014-06-29: 100 mL via INTRAVENOUS

## 2014-06-29 NOTE — Progress Notes (Signed)
Chief Complaint: Status post percutaneous ablation of right renal carcinomas.  Referring Physician(s): Antrone Walla T  History of Present Illness: [name] is a 80 y.o. male status post percutaneous radiofrequency ablation of right lower pole renal carcinoma on 04/04/2009 and percutaneous cryoablation of interpolar right clear cell carcinoma on 06/20/2012.  [name] reports some recent episodes of lightheadedness and syncope. This has not been worked up.  He is planning on making appointment to see Dr. Addison Lank.  The patient is status post interval cholecystectomy on 09/08/2013 by Dr. Zella Richer for cholecystitis.  He has recently lost weight after modifying his diet.  Past Medical History  Diagnosis Date  . Hypokalemia   . Hyperlipidemia   . Atrial fibrillation     longterm persistent  . CAD (coronary artery disease)   . HTN (hypertension)   . Shortness of breath     PT STATES RELATED TO HIS AF AND HEART BEING OUT OF RHYTHM  . Back pain, chronic     SINCE BACK INJURY / FRACTURE  . GERD (gastroesophageal reflux disease)   . Arthritis   . Cough 06/16/12    C/O OF COUGH / COLD FOR A COUPLE OF WEEKS  . Cancer     RIGHT RENAL  . Aortic valve replaced   . Stomach ulcer   . Constipation   . BPH (benign prostatic hyperplasia)     grapey    Past Surgical History  Procedure Laterality Date  . Aortic valve replacement    . Coronary artery bypass graft    . Thoracic t12 compression fracture    . Radiofrequency ablation kidney      RIGHT (two surgeries)  . Cholecystectomy N/A 09/08/2013    Procedure: LAPAROSCOPIC CHOLECYSTECTOMY WITH INTRAOPERATIVE CHOLANGIOGRAM;  Surgeon: Odis Hollingshead, MD;  Location: [facility];  Service: General;  Laterality: N/A;    Allergies: Review of patient's allergies indicates no known allergies.  Medications: Prior to Admission medications   Medication Sig Start Date End Date Taking? Authorizing Provider  acetaminophen (TYLENOL) 325 MG  tablet Take 650 mg by mouth every 6 (six) hours as needed for mild pain.     Historical Provider, MD  amoxicillin (AMOXIL) 500 MG capsule Take 500 mg by mouth as needed.    Historical Provider, MD  aspirin EC 81 MG EC tablet Take 1 tablet (81 mg total) by mouth daily. 09/11/13   Janece Canterbury, MD  carvedilol (COREG) 25 MG tablet Take 1 tablet (25 mg total) by mouth 2 (two) times daily with a meal. 10/19/13   Josue Hector, MD  cholecalciferol (VITAMIN D) 1000 UNITS tablet Take 1,000 Units by mouth daily.    Historical Provider, MD  digoxin (LANOXIN) 0.125 MG tablet TAKE ONE TABLET BY MOUTH ONCE DAILY 06/23/14   Josue Hector, MD  docusate sodium (COLACE) 50 MG capsule Take 50 mg by mouth 2 (two) times daily.    Historical Provider, MD  Multiple Vitamin (MULTIVITAMIN WITH MINERALS) TABS tablet Take 1 tablet by mouth daily.    Historical Provider, MD  nitroGLYCERIN (NITROSTAT) 0.4 MG SL tablet Place 1 tablet (0.4 mg total) under the tongue every 5 (five) minutes as needed. For chest pain 04/21/13   Josue Hector, MD  potassium chloride (KLOR-CON) 8 MEQ tablet Take 1 tablet (8 mEq total) by mouth daily. 10/19/13   Josue Hector, MD  ramipril (ALTACE) 5 MG capsule Take 5 mg by mouth daily.    Historical Provider, MD  ranitidine (ZANTAC)  150 MG tablet Take 150 mg by mouth 2 (two) times daily as needed for heartburn.     Historical Provider, MD  solifenacin (VESICARE) 10 MG tablet Take 10 mg by mouth daily.     Historical Provider, MD  Tamsulosin HCl (FLOMAX) 0.4 MG CAPS Take 0.4 mg by mouth daily.     Historical Provider, MD  torsemide (DEMADEX) 10 MG tablet Take 1 tablet (10 mg total) by mouth 2 (two) times daily. 10/19/13   Josue Hector, MD  vitamin B-12 (CYANOCOBALAMIN) 1000 MCG tablet Take 1,000 mcg by mouth daily.      Historical Provider, MD  warfarin (COUMADIN) 5 MG tablet 2 tablets on all days except 1.5 tablets on  Monday, Wednesday, and Friday or as directed by coumadin clinic 01/11/14   Josue Hector, MD    Family History  Problem Relation Age of Onset  . Hypertension Sister   . Diabetes Sister   . Hypertension Mother   . Diabetes Mother   . Hypertension Brother   . Diabetes Brother     History   Social History  . Marital Status: Married    Spouse Name: N/A    Number of Children: N/A  . Years of Education: N/A   Social History Main Topics  . Smoking status: Former Smoker -- 2.00 packs/day for 20 years    Types: Cigarettes    Quit date: 08/27/1978  . Smokeless tobacco: None  . Alcohol Use: No  . Drug Use: No  . Sexual Activity: None   Other Topics Concern  . None   Social History Narrative   Lives with his wife, good family support, ambulates without assist device     Review of Systems: A 12 point ROS discussed and pertinent positives are indicated in the HPI above.  All other systems are negative.  Review of Systems  Constitutional: Negative.  Negative for fever and chills.  Respiratory: Negative.   Cardiovascular: Negative.   Gastrointestinal: Negative.   Genitourinary: Positive for frequency. Negative for dysuria, hematuria and flank pain.    Vital Signs: BP 198/106 mmHg  Pulse 80  Temp(Src) 97.8 F (36.6 C)  Resp 20  Ht 6\' 4"  (1.93 m)  Wt 220 lb (99.791 kg)  BMI 26.79 kg/m2  SpO2 97%  Physical Exam  Constitutional: He appears well-developed and well-nourished. No distress.  Abdominal: Soft. He exhibits no distension and no mass. There is no tenderness. There is no rebound and no guarding.  Skin: Skin is warm and dry. He is not diaphoretic.    Imaging: Ct Abd Wo & W Cm  06/29/2014   CLINICAL DATA:  12 month follow-up right renal ablation, history of right renal cell cancer  EXAM: CT ABDOMEN WITHOUT AND WITH CONTRAST  TECHNIQUE: Multidetector CT imaging of the abdomen was performed following the standard protocol before and following the bolus administration of intravenous contrast.  CONTRAST:  167mL OMNIPAQUE IOHEXOL 300 MG/ML  SOLN   COMPARISON:  CT abdomen pelvis dated 09/01/2013  FINDINGS: Lower chest:  Lung bases are clear.  Hepatobiliary: Calcified granuloma in the anterior segment right hepatic lobe (series 6/ image 46). Liver is otherwise within normal limits. No suspicious/enhancing hepatic lesions.  Status post cholecystectomy. No intrahepatic or extrahepatic ductal dilatation.  Pancreas: Within normal limits.  Spleen: Within normal limits.  Adrenals/Urinary Tract: Mild thickening of the left adrenal gland without discrete nodule/mass. Right adrenal gland is within normal limits.  Left kidney is within normal limits.  Medial right  lower pole ablation zone measures 2.6 x 3.6 cm (series 2/ image 67), without enhancement to suggest viable tumor. An additional 1.6 x 1.8 cm hemorrhagic/proteinaceous cyst is present along the lateral right upper kidney (series 6/image 50), without enhancement (measuring 36, 46, 43, and 41 HUs on all four phases). Additional 10 mm cyst in the anterior right lower pole.  No hydronephrosis.  Stomach/Bowel: Stomach is unremarkable.  Visualized bowel notable for colonic diverticulosis, without inflammatory changes.  Vascular/Lymphatic: Vascular calcifications.  No abdominal lymphadenopathy.  Other: No abdominal ascites.  Musculoskeletal: Degenerative changes of the thoracolumbar spine.  Mild compression deformity with prior vertebral augmentation at T12.  IMPRESSION: Stable medial right lower pole ablation, without viable tumor.  Stable 1.8 cm hemorrhagic/proteinaceous cyst along the lateral right upper kidney, without enhancement, benign (Bosniak II).   Electronically Signed   By: Julian Hy M.D.   On: 06/29/2014 08:49    Labs:  CBC:  Recent Labs  09/07/13 0527 09/08/13 0524 09/09/13 0608 09/10/13 0532  WBC 6.4 7.2 9.3 8.5  HGB 11.9* 12.0* 11.6* 10.9*  HCT 35.0* 34.9* 34.0* 32.2*  PLT 257 270 247 245    COAGS:  Recent Labs  03/25/14 0939 04/22/14 0854 05/20/14 0853 06/04/14 0851    INR 2.8 2.6 3.7 2.2    BMP:  Recent Labs  09/07/13 0527 09/08/13 0524 09/09/13 0608 09/10/13 0532 06/23/14 0914  NA 134* 133* 133* 134*  --   K 3.9 4.2 4.3 4.0  --   CL 98 98 98 98  --   CO2 22 21 22 25   --   GLUCOSE 116* 104* 108* 115*  --   BUN 7 9 10 8 13   CALCIUM 9.5 9.5 9.0 9.0  --   CREATININE 0.86 0.88 0.85 0.92 0.95  GFRNONAA 82* 81* 82* 79* 76  GFRAA >90 >90 >90 >90 88    LIVER FUNCTION TESTS:  Recent Labs  09/06/13 0522 09/07/13 0527 09/08/13 0524 09/10/13 0532  BILITOT 0.4 0.5 0.7 0.6  AST 33 33 49* 40*  ALT 64* 63* 63* 62*  ALKPHOS 102 104 103 87  PROT 6.1 6.5 6.5 5.9*  ALBUMIN 3.0* 3.2* 3.3* 2.8*    TUMOR MARKERS: No results for input(s): AFPTM, CEA, CA199, CHROMGRNA in the last 8760 hours.  Assessment and Plan:  No evidence of recurrent carcinoma in the right kidney status post prior ablation of 2 separate carcinomas. The most recent procedure was performed 2 years ago in the interpolar region. Initial ablation of a lower pole carcinoma was performed 5 years ago. There is no evidence by imaging of recurrent carcinoma in either location.  Renal function is stable and normal. No new renal lesions are identified. A hyperdense/hemorrhagic cyst show stable appearance in the upper pole the right kidney.  I recommended follow up CT in 1 year.  [name] is scheduled to follow up with Dr. Risa Grill for increased urinary frequency. He is on Vesicare. He will also contact Dr. Addison Lank for follow up regarding recent syncopal episodes.  I spent a total of 20 minutes face to face in clinical consultation, greater than 50% of which was counseling/coordinating care for follow up status post right renal ablation.   Venetia Night. Kathlene Cote, M.D. Pager:  [phone]      Signed: Azzie Roup 06/29/2014, 2:14 PM

## 2014-07-02 ENCOUNTER — Ambulatory Visit (INDEPENDENT_AMBULATORY_CARE_PROVIDER_SITE_OTHER): Payer: Medicare Other | Admitting: *Deleted

## 2014-07-02 DIAGNOSIS — Z5181 Encounter for therapeutic drug level monitoring: Secondary | ICD-10-CM

## 2014-07-02 DIAGNOSIS — Z952 Presence of prosthetic heart valve: Secondary | ICD-10-CM

## 2014-07-02 DIAGNOSIS — Z7901 Long term (current) use of anticoagulants: Secondary | ICD-10-CM

## 2014-07-02 DIAGNOSIS — I359 Nonrheumatic aortic valve disorder, unspecified: Secondary | ICD-10-CM

## 2014-07-02 DIAGNOSIS — I4891 Unspecified atrial fibrillation: Secondary | ICD-10-CM

## 2014-07-02 DIAGNOSIS — Z954 Presence of other heart-valve replacement: Secondary | ICD-10-CM

## 2014-07-02 LAB — POCT INR: INR: 2

## 2014-07-26 ENCOUNTER — Ambulatory Visit: Payer: Medicare Other | Admitting: Physical Therapy

## 2014-07-26 ENCOUNTER — Encounter: Payer: Self-pay | Admitting: Physical Therapy

## 2014-07-26 ENCOUNTER — Ambulatory Visit: Payer: Medicare Other | Attending: Family Medicine | Admitting: Physical Therapy

## 2014-07-26 VITALS — BP 151/102 | HR 59

## 2014-07-26 DIAGNOSIS — R296 Repeated falls: Secondary | ICD-10-CM

## 2014-07-26 DIAGNOSIS — R269 Unspecified abnormalities of gait and mobility: Secondary | ICD-10-CM | POA: Insufficient documentation

## 2014-07-26 DIAGNOSIS — R6889 Other general symptoms and signs: Secondary | ICD-10-CM | POA: Diagnosis not present

## 2014-07-26 DIAGNOSIS — Z5189 Encounter for other specified aftercare: Secondary | ICD-10-CM | POA: Insufficient documentation

## 2014-07-26 NOTE — Therapy (Signed)
Physical Therapy Evaluation  Patient Details  Name: [name] MRN: [id number] Date of Birth: 16-Sep-1933  Encounter Date: 07/26/2014      PT End of Session - 07/26/14 1558    Visit Number 1   Number of Visits 17   Date for PT Re-Evaluation 09/24/14   PT Start Time 0848   PT Stop Time 0931   PT Time Calculation (min) 43 min   Equipment Utilized During Treatment Gait belt   Activity Tolerance Patient tolerated treatment well   Behavior During Therapy [facility] for tasks assessed/performed      Past Medical History  Diagnosis Date  . Hypokalemia   . Hyperlipidemia   . Atrial fibrillation     longterm persistent  . CAD (coronary artery disease)   . HTN (hypertension)   . Shortness of breath     PT STATES RELATED TO HIS AF AND HEART BEING OUT OF RHYTHM  . Back pain, chronic     SINCE BACK INJURY / FRACTURE  . GERD (gastroesophageal reflux disease)   . Arthritis   . Cough 06/16/12    C/O OF COUGH / COLD FOR A COUPLE OF WEEKS  . Cancer     RIGHT RENAL  . Aortic valve replaced   . Stomach ulcer   . Constipation   . BPH (benign prostatic hyperplasia)     grapey    Past Surgical History  Procedure Laterality Date  . Aortic valve replacement    . Coronary artery bypass graft    . Thoracic t12 compression fracture    . Radiofrequency ablation kidney      RIGHT (two surgeries)  . Cholecystectomy N/A 09/08/2013    Procedure: LAPAROSCOPIC CHOLECYSTECTOMY WITH INTRAOPERATIVE CHOLANGIOGRAM;  Surgeon: Odis Hollingshead, MD;  Location: [facility];  Service: General;  Laterality: N/A;    BP 151/102 mmHg  Pulse 59  Visit Diagnosis:  Abnormality of gait  Falls frequently  Activity intolerance      Subjective Assessment - 07/26/14 0856    Symptoms Heads feels a little swimmy. Feels slightly dizzy while at rest, not with position change.    Patient Stated Goals Would like to improve ability to stand, would like to improve walking distance and safety.    Currently in Pain?  No/denies          [facility] PT Assessment - 07/26/14 0845    Balance Screen   Has the patient fallen in the past 6 months Yes   How many times? 2-3 times a week   Has the patient had a decrease in activity level because of a fear of falling?  Yes   Is the patient reluctant to leave their home because of a fear of falling?  Yes   [place] Private residence   Living Arrangements Spouse/significant other  unable to assist getting off floor   Available Help at Discharge Family   Type of [place] entrance;Stairs to enter  rail on the R side (back door)   Entrance Stairs-Number of Steps 2  secondary entrance   Entrance Stairs-Rails Right;Left  can not reach both at the same time   [place] - 2 wheels;Cane - single point   Prior Function   Level of Independence Independent with basic ADLs;Independent with gait;Independent with transfers   Observation/Other Assessments   Focus on Therapeutic Outcomes (FOTO)  FS Primary Measure (40)   Posture/Postural Control  Posture/Postural Control Postural limitations   Postural Limitations Rounded Shoulders;Forward head   Strength   Overall Strength Within functional limits for tasks performed   Overall Strength Comments Tested in sitting   Ambulation/Gait   Ambulation/Gait Yes   Ambulation/Gait Assistance 4: Min guard   Ambulation Distance (Feet) 100 Feet   Assistive device Straight cane  cane in RUE   Gait Pattern Step-through pattern;Decreased stride length;Wide base of support  increased toe out BLE, increased pronation on R foot   Gait velocity 2.1 ft/sec   Balance   Balance Assessed Yes   Standardized Balance Assessment   Standardized Balance Assessment Timed Up and Go Test   Berg Balance Test   Sit to Stand Able to stand using hands after several tries   Standing Unsupported Able to stand 2 minutes with supervision   Sitting with Back  Unsupported but Feet Supported on Floor or Stool Able to sit safely and securely 2 minutes   Stand to Sit Sits safely with minimal use of hands   Transfers Able to transfer safely, definite need of hands   Standing Unsupported with Eyes Closed Able to stand 10 seconds with supervision   Standing Ubsupported with Feet Together Able to place feet together independently but unable to hold for 30 seconds   From Standing, Reach Forward with Outstretched Arm Can reach confidently >25 cm (10")   From Standing Position, Pick up Object from Floor Able to pick up shoe, needs supervision   From Standing Position, Turn to Look Behind Over each Shoulder Needs supervision when turning   Turn 360 Degrees Needs close supervision or verbal cueing   Standing Unsupported, Alternately Place Feet on Step/Stool Able to complete >2 steps/needs minimal assist   Standing Unsupported, One Foot in [place] to take small step independently and hold 30 seconds   Standing on One Leg Tries to lift leg/unable to hold 3 seconds but remains standing independently   Total Score 34   Timed Up and Go Test   Normal TUG (seconds) 15.37  w/ cane; 16.75 w/ out cane;    Cognitive TUG (seconds) 18.63            PT Education - 07/26/14 1557    Education provided Yes   Education Details Direction of physical therapy and targeted areas for improvement   Person(s) Educated Patient;Spouse   Methods Explanation   Comprehension Verbalized understanding          PT Short Term Goals - 07/26/14 1603    PT SHORT TERM GOAL #1   Title Demonstrate HEP with minimal cues from PT (08/23/14)   Time 4   Period Weeks   Status New   PT SHORT TERM GOAL #2   Title Improve TUG score while using cane and under supervision of PT to </= 14.0 seconds (08/23/14)   Time 4   Period Weeks   Status New   PT SHORT TERM GOAL #3   Title Improve gait speed to >/= 2.54ft/sec with supervision from PT (08/23/14)   Time 4   Period Weeks   Status  New   PT SHORT TERM GOAL #4   Title Perform floor to chair transfer with supervision and no cueing from PT (08/23/14)   Time 4   Period Weeks   Status New   PT SHORT TERM GOAL #5   Title Patient family member will verbalize and demonstrate proper guarding techniques when patient wears gait belt (08/23/14)   Time 4   Period  Weeks   Status New          PT Long Term Goals - 07/26/14 1607    PT LONG TERM GOAL #1   Title Demonstrate HEP independently with no cueing from PT for accuracy (09/21/14)   Time 8   Period Weeks   Status New   PT LONG TERM GOAL #2   Title Verbalize understanding of fall prevention strategies (09/21/14)   Time 8   Period Weeks   Status New   PT LONG TERM GOAL #3   Title Improve TUG score while using cane </= 13.5 seconds (09/21/14)   Time 8   Period Weeks   Status New   PT LONG TERM GOAL #4   Title Improve gait speed to >/= 2.62 ft/sec (09/21/14)   Time 8   Period Weeks   Status New   PT LONG TERM GOAL #5   Title Improve BERG balance score to >/= 45/56 (09/21/14)   Time 8   Period Weeks   Status New   Additional Long Term Goals   Additional Long Term Goals Yes   PT LONG TERM GOAL #6   Title Improve FOTO score by >10 points   Time 8   Period Weeks   Status New          Plan - 07/26/14 1559    Clinical Impression Statement Patient demonstrated increased fall risk with balance and ambulation with single point cane. Patient demonstrated decreased gait speed and decreased stability during TUG w/ no assistive device and cognitive TUG. Pateint will benefit from skilled therapy geared towards fall risk reduction education, balance training and gait training to increase independence with mobility while decreasing future fall risk.   Pt will benefit from skilled therapeutic intervention in order to improve on the following deficits Abnormal gait;Difficulty walking;Decreased endurance;Decreased safety awareness;Decreased activity tolerance;Decreased  balance;Decreased cognition;Decreased mobility   Rehab Potential Good   PT Frequency 2x / week   PT Duration 8 weeks   PT Treatment/Interventions ADLs/Self Care Home Management;Therapeutic activities;Patient/family education;Therapeutic exercise;Gait training;Balance training;Manual techniques;Neuromuscular re-education;Energy conservation;Stair training   PT Next Visit Plan Assess mobility static standing balance and ambulation with head turns to determine if head feeling "swimmy" is vestibular in origin.    Consulted and Agree with Plan of Care Patient;Family member/caregiver   Family Member Consulted Wife and daughter        Problem List Patient Active Problem List   Diagnosis Date Noted  . Encounter for therapeutic drug monitoring 11/06/2013  . Acute NSTEMI (non-ST elevated myocardial infarction) 09/26/2013  . Chronic calculous cholecystitis 09/08/2013  . Acalculous cholecystitis 09/04/2013  . Abdominal pain, other specified site 09/01/2013  . Constipation 09/01/2013  . Hypotension 09/01/2013  . Leukocytosis 09/01/2013  . Normocytic anemia 09/01/2013  . Hyponatremia 09/01/2013  . Bruit 04/21/2013  . Encounter for long-term (current) use of anticoagulants 11/22/2010  . Chronic diastolic heart failure 02/63/7858  . Aortic valve disorder 10/23/2010  . EDEMA 04/26/2010  . DYSPNEA 04/26/2010  . CHEST PAIN 04/26/2010  . HYPERLIPIDEMIA 01/21/2009  . HYPOKALEMIA 01/21/2009  . HYPERTENSION 01/21/2009  . CAD 01/21/2009  . Atrial fibrillation 01/21/2009  . ALTERED MENTAL STATUS 01/21/2009  . AORTIC VALVE REPLACEMENT, HX OF 01/21/2009      Jamey Reas, PT updated evaluation notes. See note on 07/26/14 for details.   All PT delivered under supervision of Licensed Physical Therapist.     Blima Rich, PT Student 07/26/2014, 4:13 PM   Jamey Reas, PT, DPT PT Specializing  in [place] 07/27/2014 8:36 AM Phone:  [phone]  Fax:  [phone] [place] [address] [place], [place] [address]

## 2014-07-27 ENCOUNTER — Ambulatory Visit: Payer: Medicare Other | Admitting: Physical Therapy

## 2014-07-27 ENCOUNTER — Encounter: Payer: Self-pay | Admitting: Physical Therapy

## 2014-07-27 NOTE — Therapy (Signed)
Physical Therapy Evaluation  Patient Details  Name: [name] MRN: [id number] Date of Birth: 03-14-1934  Encounter Date: 07/26/2014      PT End of Session - 07/26/14 1558    Visit Number 1   Number of Visits 18   Date for PT Re-Evaluation 09/24/14   PT Start Time 0848   PT Stop Time 0931   PT Time Calculation (min) 43 min   Equipment Utilized During Treatment Gait belt   Activity Tolerance Patient tolerated treatment well   Behavior During Therapy [facility] for tasks assessed/performed      Past Medical History  Diagnosis Date  . Hypokalemia   . Hyperlipidemia   . Atrial fibrillation     longterm persistent  . CAD (coronary artery disease)   . HTN (hypertension)   . Shortness of breath     PT STATES RELATED TO HIS AF AND HEART BEING OUT OF RHYTHM  . Back pain, chronic     SINCE BACK INJURY / FRACTURE  . GERD (gastroesophageal reflux disease)   . Arthritis   . Cough 06/16/12    C/O OF COUGH / COLD FOR A COUPLE OF WEEKS  . Cancer     RIGHT RENAL  . Aortic valve replaced   . Stomach ulcer   . Constipation   . BPH (benign prostatic hyperplasia)     grapey    Past Surgical History  Procedure Laterality Date  . Aortic valve replacement    . Coronary artery bypass graft    . Thoracic t12 compression fracture    . Radiofrequency ablation kidney      RIGHT (two surgeries)  . Cholecystectomy N/A 09/08/2013    Procedure: LAPAROSCOPIC CHOLECYSTECTOMY WITH INTRAOPERATIVE CHOLANGIOGRAM;  Surgeon: Odis Hollingshead, MD;  Location: [facility];  Service: General;  Laterality: N/A;    BP 151/102 mmHg  Pulse 59  Visit Diagnosis:  Abnormality of gait  Falls frequently  Activity intolerance      Subjective Assessment - 07/26/14 0856    Symptoms Heads feels a little swimmy. Feels slightly dizzy while at rest, not with position change.    Patient Stated Goals Would like to improve ability to stand, would like to improve walking distance and safety.    Currently in Pain?  No/denies          [facility] PT Assessment - 07/26/14 0845    Assessment   Medical Diagnosis gait abnormality   Precautions   Precautions Fall   Balance Screen   Has the patient fallen in the past 6 months Yes   How many times? 2-3 times a week   Has the patient had a decrease in activity level because of a fear of falling?  Yes   Is the patient reluctant to leave their home because of a fear of falling?  Yes   [place] Private residence   Living Arrangements Spouse/significant other  unable to assist getting off floor   Available Help at Discharge Family   Type of [place] entrance;Stairs to enter  rail on the R side (back door)   Entrance Stairs-Number of Steps 2  secondary entrance   Entrance Stairs-Rails Right;Left  can not reach both at the same time   [place] - 2 wheels;Cane - single point   Prior Function   Level of Independence Independent with basic ADLs;Independent with gait;Independent with transfers   Cognition  Overall Cognitive Status --  appears to have some cognitive changes but none documented   Observation/Other Assessments   Focus on Therapeutic Outcomes (FOTO)  40  Functional Status   Activities of Balance Confidence Scale (ABC Scale)  16.9%   Posture/Postural Control   Posture/Postural Control Postural limitations   Postural Limitations Rounded Shoulders;Forward head   Strength   Overall Strength Within functional limits for tasks performed   Overall Strength Comments Tested in sitting   Ambulation/Gait   Ambulation/Gait Yes   Ambulation/Gait Assistance 4: Min assist   Ambulation Distance (Feet) 100 Feet   Assistive device Straight cane  cane in RUE   Gait Pattern Step-through pattern;Decreased stride length;Wide base of support  increased toe out BLE, increased pronation on R foot   Gait velocity 2.1 ft/sec   Balance   Balance Assessed Yes   Standardized  Balance Assessment   Standardized Balance Assessment Timed Up and Go Test;Berg Balance Test   Berg Balance Test   Sit to Stand Able to stand using hands after several tries   Standing Unsupported Able to stand 2 minutes with supervision   Sitting with Back Unsupported but Feet Supported on Floor or Stool Able to sit safely and securely 2 minutes   Stand to Sit Controls descent by using hands   Transfers Able to transfer safely, definite need of hands   Standing Unsupported with Eyes Closed Able to stand 10 seconds with supervision   Standing Ubsupported with Feet Together Able to place feet together independently but unable to hold for 30 seconds   From Standing, Reach Forward with Outstretched Arm Reaches forward but needs supervision   From Standing Position, Pick up Object from [place] to pick up shoe, needs supervision   From Standing Position, Turn to Look Behind Over each Shoulder Needs supervision when turning   Turn 360 Degrees Needs close supervision or verbal cueing   Standing Unsupported, Alternately Place Feet on Step/Stool Able to complete >2 steps/needs minimal assist   Standing Unsupported, One Foot in Front Able to take small step independently and hold 30 seconds   Standing on One Leg Tries to lift leg/unable to hold 3 seconds but remains standing independently   Total Score 30   Timed Up and Go Test   Normal TUG (seconds) 15.37  w/ cane; 16.75 w/ out cane;    Cognitive TUG (seconds) 18.63  no cane            PT Education - 07/26/14 1557    Education provided Yes   Education Details Direction of physical therapy and targeted areas for improvement   Person(s) Educated Patient;Spouse   Methods Explanation   Comprehension Verbalized understanding          PT Short Term Goals - 07/26/14 1603    PT SHORT TERM GOAL #1   Title Demonstrate initial HEP with family assistance (Target Date: 08/25/14)   Time 1   Period Months   Status New   PT SHORT TERM GOAL #2    Title Improve TUG score while using cane with supervision to </= 13.5 seconds (Target Date: 08/25/14)   Time 1   Period Months   Status New   PT SHORT TERM GOAL #3   Title Improve gait speed to >/= 2.84ft/sec with supervision (Target Date: 08/25/14)   Time 1   Period Months   Status New   PT SHORT TERM GOAL #4   Title Perform floor to chair transfer with supervision (Target Date:  08/25/14)   Time 1   Period Months   Status New   PT SHORT TERM GOAL #5   Title ambulates 500' with LRAD & negotiates ramp & curb with supervision. (Target Date: 08/25/14)   Time 1   Period Months   Status New          PT Long Term Goals - 08-22-14 1607    PT LONG TERM GOAL #1   Title demonstrates progressive HEP / ongoing fitness plan with family assistance as needed. (Target Date: 09/24/14)   Time 2   Period Months   Status New   PT LONG TERM GOAL #2   Title Verbalize understanding of fall prevention strategies with family assistance if needed (Target Date: 09/24/14)   Time 2   Period Months   Status New   PT LONG TERM GOAL #3   Title Cognitive TUG <14.5 seconds (Target Date: 09/24/14)   Time 2   Period Months   Status New   PT LONG TERM GOAL #4   Title Improve gait speed to >/= 2.62 ft/sec with LRAD (Target Date: 09/24/14)   Time 2   Period Months   Status New   PT LONG TERM GOAL #5   Title Improve BERG balance score to >/= 45/56 (Target Date: 09/24/14)   Time 2   Period Months   Status New   Additional Long Term Goals   Additional Long Term Goals --   PT LONG TERM GOAL #6   Title Improve ABC to >30% (Target Date: 09/24/14)   Time 2   Period Months   Status New   PT LONG TERM GOAL #7   Title ambulates >500' on various terrain including ramp, curb, stairs with LRAD modified independent (Target Date: 09/24/14)   Time 2   Period Months   Status New          Plan - 08-22-2014 1559    Clinical Impression Statement Patient presented to PT for evaluation for increased falls & gait  instability. Patient demonstrated increased fall risk with balance noted with Merrilee Jansky & TUG test and ambulation with single point cane. Patient demonstrated decreased gait speed and decreased stability during TUG w/ no assistive device and cognitive TUG. Pateint will benefit from skilled therapy geared towards fall risk reduction education, balance training and gait training to increase independence with mobility while decreasing future fall risk.   Pt will benefit from skilled therapeutic intervention in order to improve on the following deficits Abnormal gait;Difficulty walking;Decreased endurance;Decreased safety awareness;Decreased activity tolerance;Decreased balance;Decreased cognition;Decreased mobility;Decreased strength   Rehab Potential Good   PT Frequency 2x / week   PT Duration Other (comment)  9 weeks (2 months)   PT Treatment/Interventions ADLs/Self Care Home Management;Therapeutic activities;Patient/family education;Therapeutic exercise;Gait training;Balance training;Manual techniques;Neuromuscular re-education;Energy conservation;Stair training;Functional mobility training;DME Instruction   PT Next Visit Plan  Assess if vestibular issues, check orthostatic hypotension   PT Home Exercise Plan HEP for balance   Consulted and Agree with Plan of Care Patient;Family member/caregiver   Family Member Consulted Wife and daughter          G-Codes - 2014-08-22 0930    Functional Assessment Tool Used Berg Balance 30/56   Functional Limitation Mobility: Walking and moving around   Mobility: Walking and Moving Around Current Status [phone]) At least 40 percent but less than 60 percent impaired, limited or restricted   Mobility: Walking and Moving Around Goal Status [phone]) At least 20 percent but less than 40 percent impaired, limited or  restricted      Problem List Patient Active Problem List   Diagnosis Date Noted  . Encounter for therapeutic drug monitoring 11/06/2013  . Acute NSTEMI  (non-ST elevated myocardial infarction) 09/26/2013  . Chronic calculous cholecystitis 09/08/2013  . Acalculous cholecystitis 09/04/2013  . Abdominal pain, other specified site 09/01/2013  . Constipation 09/01/2013  . Hypotension 09/01/2013  . Leukocytosis 09/01/2013  . Normocytic anemia 09/01/2013  . Hyponatremia 09/01/2013  . Bruit 04/21/2013  . Encounter for long-term (current) use of anticoagulants 11/22/2010  . Chronic diastolic heart failure 86/76/7209  . Aortic valve disorder 10/23/2010  . EDEMA 04/26/2010  . DYSPNEA 04/26/2010  . CHEST PAIN 04/26/2010  . HYPERLIPIDEMIA 01/21/2009  . HYPOKALEMIA 01/21/2009  . HYPERTENSION 01/21/2009  . CAD 01/21/2009  . Atrial fibrillation 01/21/2009  . ALTERED MENTAL STATUS 01/21/2009  . AORTIC VALVE REPLACEMENT, HX OF 01/21/2009    This entire session was performed under direct supervision and direction of a licensed therapist/therapist assistant . I have personally read, edited and approve of the note as written.  Blima Rich, Student PT  Jamey Reas, PT, DPT PT Specializing in [place] 07/27/2014 8:29 AM Phone:  [phone]  Fax:  [phone] [place] [address] [place], [place] [address]   Jamey Reas 07/27/2014, 8:26 AM

## 2014-07-28 ENCOUNTER — Encounter: Payer: Self-pay | Admitting: Physical Therapy

## 2014-07-28 ENCOUNTER — Ambulatory Visit: Payer: Medicare Other | Attending: Family Medicine | Admitting: Physical Therapy

## 2014-07-28 DIAGNOSIS — R6889 Other general symptoms and signs: Secondary | ICD-10-CM | POA: Diagnosis not present

## 2014-07-28 DIAGNOSIS — R296 Repeated falls: Secondary | ICD-10-CM | POA: Diagnosis not present

## 2014-07-28 DIAGNOSIS — R269 Unspecified abnormalities of gait and mobility: Secondary | ICD-10-CM | POA: Insufficient documentation

## 2014-07-28 DIAGNOSIS — Z5189 Encounter for other specified aftercare: Secondary | ICD-10-CM | POA: Diagnosis not present

## 2014-07-28 NOTE — Patient Instructions (Addendum)
Orthostatic Hypotension Orthostatic hypotension is a sudden drop in blood pressure. It happens when you quickly stand up from a seated or lying position. You may feel dizzy or light-headed. This can last for just a few seconds or for up to a few minutes. It is usually not a serious problem. However, if this happens frequently or gets worse, it can be a sign of something more serious. CAUSES  Different things can cause orthostatic hypotension, including:   Loss of body fluids (dehydration).  Medicines that lower blood pressure.  Sudden changes in posture, such as standing up quickly after you have been sitting or lying down.  Taking too much of your medicine. SIGNS AND SYMPTOMS   Light-headedness or dizziness.   Fainting or near-fainting.   A fast heart rate.   Weakness.   Feeling tired (fatigue).  DIAGNOSIS  Your health care provider may do several things to help diagnose your condition and identify the cause. These may include:   Taking a medical history and doing a physical exam.  Checking your blood pressure. Your health care provider will check your blood pressure when you are:  Lying down.  Sitting.  Standing.  Using tilt table testing. In this test, you lie down on a table that moves from a lying position to a standing position. You will be strapped onto the table. This test monitors your blood pressure and heart rate when you are in different positions. TREATMENT  Treatment will vary depending on the cause. Possible treatments include:   Changing the dosage of your medicines.  Wearing compression stockings on your lower legs.  Standing up slowly after sitting or lying down.  Eating more salt.  Eating frequent, small meals.  In some cases, getting IV fluids.  Taking medicine to enhance fluid retention. HOME CARE INSTRUCTIONS  Only take over-the-counter or prescription medicines as directed by your health care provider.  Follow your health care  provider's instructions for changing the dosage of your current medicines.  Do not stop or adjust your medicine on your own.  Stand up slowly after sitting or lying down. This allows your body to adjust to the different position.  Wear compression stockings as directed.  Eat extra salt as directed.  Do not add extra salt to your diet unless directed to by your health care provider.  Eat frequent, small meals.  Avoid standing suddenly after eating.  Avoid hot showers or excessive heat as directed by your health care provider.  Keep all follow-up appointments. SEEK MEDICAL CARE IF:  You continue to feel dizzy or light-headed after standing.  You feel groggy or confused.  You feel cold, clammy, or sick to your stomach (nauseous).  You have blurred vision.  You feel short of breath. SEEK IMMEDIATE MEDICAL CARE IF:   You faint after standing.  You have chest pain.  You have difficulty breathing.   You lose feeling or movement in your arms or legs.   You have slurred speech or difficulty talking, or you are unable to talk.  MAKE SURE YOU:   Understand these instructions.  Will watch your condition.  Will get help right away if you are not doing well or get worse. Document Released: 08/03/2002 Document Revised: 08/18/2013 Document Reviewed: 06/05/2013 [facility] Patient Information 2015 [place], [place]. This information is not intended to replace advice given to you by your health care provider. Make sure you discuss any questions you have with your health care provider. Feet Together, Varied Arm Positions - Eyes Closed  In corner with chair in front of you: Stand with feet together and arms at sides. Close eyes and visualize upright position. Hold _15___ seconds. Repeat _3__ times per session. Do _1-2___ sessions per day.  Copyright  VHI. All rights reserved.  Feet Apart, Head Motion - Eyes Closed   In corner with chair in front of you: with eyes closed and  feet shoulder width apart, move head slowly, up and down. Then move head left to right. Repeat __5-10__ times each one, each way.  Do _1-2___ sessions per day.  Copyright  VHI. All rights reserved.

## 2014-07-29 NOTE — Therapy (Signed)
[facility] [address] [place], [place], [address] Phone: [phone]   Fax:  [phone]  Physical Therapy Treatment  Patient Details  Name: [name] MRN: [id number] Date of Birth: 13-Mar-1934  Encounter Date: 07/28/2014      PT End of Session - 07/28/14 4818    Visit Number 2  G2   Number of Visits 18   Date for PT Re-Evaluation 09/24/14   PT Start Time 0848   PT Stop Time 0933   PT Time Calculation (min) 45 min   Equipment Utilized During Treatment Gait belt   Activity Tolerance Patient tolerated treatment well   Behavior During Therapy [facility] for tasks assessed/performed      Past Medical History  Diagnosis Date  . Hypokalemia   . Hyperlipidemia   . Atrial fibrillation     longterm persistent  . CAD (coronary artery disease)   . HTN (hypertension)   . Shortness of breath     PT STATES RELATED TO HIS AF AND HEART BEING OUT OF RHYTHM  . Back pain, chronic     SINCE BACK INJURY / FRACTURE  . GERD (gastroesophageal reflux disease)   . Arthritis   . Cough 06/16/12    C/O OF COUGH / COLD FOR A COUPLE OF WEEKS  . Cancer     RIGHT RENAL  . Aortic valve replaced   . Stomach ulcer   . Constipation   . BPH (benign prostatic hyperplasia)     grapey    Past Surgical History  Procedure Laterality Date  . Aortic valve replacement    . Coronary artery bypass graft    . Thoracic t12 compression fracture    . Radiofrequency ablation kidney      RIGHT (two surgeries)  . Cholecystectomy N/A 09/08/2013    Procedure: LAPAROSCOPIC CHOLECYSTECTOMY WITH INTRAOPERATIVE CHOLANGIOGRAM;  Surgeon: Odis Hollingshead, MD;  Location: [facility];  Service: General;  Laterality: N/A;    There were no vitals taken for this visit.  Visit Diagnosis:  Abnormality of gait  Falls frequently  Activity intolerance      Subjective Assessment - 07/28/14 0852    Symptoms Got a little dizzy headed yesterday, still some traces today. No  falls to the floor, a couple of uncontrolled descents to the chair.   Currently in Pain? No/denies            [facility] Adult PT Treatment/Exercise - 07/28/14 0853    Transfers   Transfers Sit to Stand;Stand to Sit   Sit to Stand 5: Supervision   Stand to Sit 5: Supervision      Neuro Re-ed In corner on floor:  With feet together: eyes closed hold for 15 seconds, 3 reps With feet apart: eyes closed- head movement up/down, left/right x10 each way.          PT Education - 07/28/14 [phone]    Education provided Yes   Education Details HEP, Orthostatic hypotension information   Person(s) Educated Spouse;Patient   Methods Explanation;Demonstration;Handout   Comprehension Verbalized understanding;Verbal cues required;Need further instruction          PT Short Term Goals - 07/28/14 0927    PT SHORT TERM GOAL #1   Title Demonstrate initial HEP with family assistance (Target Date: 08/25/14)   Time 1   Period Months   Status On-going   PT SHORT TERM GOAL #2   Title Improve TUG score while using cane with supervision to </= 13.5 seconds (Target Date: 08/25/14)  Time 1   Period Months   Status On-going   PT SHORT TERM GOAL #3   Title Improve gait speed to >/= 2.23ft/sec with supervision (Target Date: 08/25/14)   Time 1   Period Months   Status On-going   PT SHORT TERM GOAL #4   Title Perform floor to chair transfer with supervision (Target Date: 08/25/14)   Time 1   Period Months   Status On-going   PT SHORT TERM GOAL #5   Title ambulates 500' with LRAD & negotiates ramp & curb with supervision. (Target Date: 08/25/14)   Time 1   Period Months   Status On-going          PT Long Term Goals - 07/28/14 0927    PT LONG TERM GOAL #1   Title demonstrates progressive HEP / ongoing fitness plan with family assistance as needed. (Target Date: 09/24/14)   Time 2   Period Months   Status On-going   PT LONG TERM GOAL #2   Title Verbalize understanding of fall prevention  strategies with family assistance if needed (Target Date: 09/24/14)   Time 2   Period Months   Status On-going   PT LONG TERM GOAL #3   Title Cognitive TUG <14.5 seconds (Target Date: 09/24/14)   Time 2   Period Months   Status On-going   PT LONG TERM GOAL #4   Title Improve gait speed to >/= 2.62 ft/sec with LRAD (Target Date: 09/24/14)   Time 2   Period Months   Status On-going   PT LONG TERM GOAL #5   Title Improve BERG balance score to >/= 45/56 (Target Date: 09/24/14)   Time 2   Period Months   Status On-going   PT LONG TERM GOAL #6   Title Improve ABC to >30% (Target Date: 09/24/14)   Time 2   Period Months   Status On-going   PT LONG TERM GOAL #7   Title ambulates >500' on various terrain including ramp, curb, stairs with LRAD modified independent (Target Date: 09/24/14)   Time 2   Period Months   Status On-going          Plan - 07/28/14 9892    Clinical Impression Statement Pt tested negative for BPPV issues and did demo mild orthostatic hypotension with testing. Pt/spouse both report he does not drink much during the day and will try to increase his water intake as dehydration is a cause of orthostatic hypotension. Educated pt and spouse on HEP for balance today as pt does demo vestibular hypofunction at times.                         Pt will benefit from skilled therapeutic intervention in order to improve on the following deficits Abnormal gait;Difficulty walking;Decreased endurance;Decreased safety awareness;Decreased activity tolerance;Decreased balance;Decreased cognition;Decreased mobility;Decreased strength   Rehab Potential Good   PT Frequency 2x / week   PT Duration Other (comment)  9 weeks   PT Treatment/Interventions ADLs/Self Care Home Management;Therapeutic activities;Patient/family education;Therapeutic exercise;Gait training;Balance training;Manual techniques;Neuromuscular re-education;Energy conservation;Stair training;Functional mobility training;DME  Instruction   PT Next Visit Plan Continue with gait and balance activities   PT Home Exercise Plan HEP issued today   Consulted and Agree with Plan of Care Patient;Family member/caregiver   Family Member Consulted spouse               Vestibular Assessment - 07/28/14 0854    Type of Dizziness Spinning  imbalance,  lightheadedness   Frequency of Dizziness weekly   Duration of Dizziness all day when it occurs   Aggravating Factors --  getting up   Relieving Factors Slow movements  ice to head   Dix-Hallpike Dix-Hallpike Left   Sidelying Test Sidelying Left   Dix-Hallpike Right Duration <20 seconds   Dix-Hallpike Right Symptoms No nystagmus  pt reported swimmy headedness   Dix-Hallpike Left Duration none   Dix-Hallpike Left Symptoms No nystagmus   Sidelying Right Duration none   Sidelying Right Symptoms No nystagmus   Sidelying Left Duration none   Sidelying Left Symptoms No nystagmus   Cognition Orientation Level Oriented x 4;Appropriate for developmental age   BP supine (x 5 minutes) 165/94 mmHg   HR supine (x 5 minutes) 72   BP sitting 160/71 mmHg   HR sitting 61   BP standing (after 1 minute) 94/54 mmHg   HR standing (after 1 minute) 77   BP standing (after 3 minutes) 100/70 mmHg   HR standing (after 3 minutes) 68   Orthostatics Comment reported no significant change in his dizziness with standing. Did have a slight increase from supine to sitting that resolved quickly.       Problem List Patient Active Problem List   Diagnosis Date Noted  . Encounter for therapeutic drug monitoring 11/06/2013  . Acute NSTEMI (non-ST elevated myocardial infarction) 09/26/2013  . Chronic calculous cholecystitis 09/08/2013  . Acalculous cholecystitis 09/04/2013  . Abdominal pain, other specified site 09/01/2013  . Constipation 09/01/2013  . Hypotension 09/01/2013  . Leukocytosis 09/01/2013  . Normocytic anemia 09/01/2013  . Hyponatremia 09/01/2013  . Bruit 04/21/2013  .  Encounter for long-term (current) use of anticoagulants 11/22/2010  . Chronic diastolic heart failure 89/38/1017  . Aortic valve disorder 10/23/2010  . EDEMA 04/26/2010  . DYSPNEA 04/26/2010  . CHEST PAIN 04/26/2010  . HYPERLIPIDEMIA 01/21/2009  . HYPOKALEMIA 01/21/2009  . HYPERTENSION 01/21/2009  . CAD 01/21/2009  . Atrial fibrillation 01/21/2009  . ALTERED MENTAL STATUS 01/21/2009  . AORTIC VALVE REPLACEMENT, HX OF 01/21/2009    Willow Ora 07/29/2014, 9:29 AM  Willow Ora, PTA, [facility] [address], [place], [place] [address] [phone] 07/29/2014, 12:55 PM

## 2014-07-30 ENCOUNTER — Ambulatory Visit (INDEPENDENT_AMBULATORY_CARE_PROVIDER_SITE_OTHER): Payer: Medicare Other

## 2014-07-30 DIAGNOSIS — I4891 Unspecified atrial fibrillation: Secondary | ICD-10-CM

## 2014-07-30 DIAGNOSIS — Z954 Presence of other heart-valve replacement: Secondary | ICD-10-CM

## 2014-07-30 DIAGNOSIS — Z952 Presence of prosthetic heart valve: Secondary | ICD-10-CM

## 2014-07-30 DIAGNOSIS — Z5181 Encounter for therapeutic drug level monitoring: Secondary | ICD-10-CM

## 2014-07-30 DIAGNOSIS — Z7901 Long term (current) use of anticoagulants: Secondary | ICD-10-CM

## 2014-07-30 DIAGNOSIS — I359 Nonrheumatic aortic valve disorder, unspecified: Secondary | ICD-10-CM

## 2014-07-30 LAB — POCT INR: INR: 3.7

## 2014-08-02 ENCOUNTER — Encounter: Payer: Self-pay | Admitting: Physical Therapy

## 2014-08-02 ENCOUNTER — Ambulatory Visit: Payer: Medicare Other | Admitting: Physical Therapy

## 2014-08-02 DIAGNOSIS — R296 Repeated falls: Secondary | ICD-10-CM

## 2014-08-02 DIAGNOSIS — Z5189 Encounter for other specified aftercare: Secondary | ICD-10-CM | POA: Diagnosis not present

## 2014-08-02 DIAGNOSIS — R269 Unspecified abnormalities of gait and mobility: Secondary | ICD-10-CM

## 2014-08-02 DIAGNOSIS — R6889 Other general symptoms and signs: Secondary | ICD-10-CM

## 2014-08-02 NOTE — Therapy (Signed)
[facility] [address] [place], [place], [address] Phone: [phone]   Fax:  [phone]  Physical Therapy Treatment  Patient Details  Name: [name] MRN: [id number] Date of Birth: Apr 21, 1934  Encounter Date: 08/02/2014      PT End of Session - 08/02/14 1351    Visit Number 3   Number of Visits 18   Date for PT Re-Evaluation 09/24/14   PT Start Time 0800   PT Stop Time 0845   PT Time Calculation (min) 45 min   Equipment Utilized During Treatment Gait belt   Activity Tolerance Patient tolerated treatment well   Behavior During Therapy [facility] for tasks assessed/performed      Past Medical History  Diagnosis Date  . Hypokalemia   . Hyperlipidemia   . Atrial fibrillation     longterm persistent  . CAD (coronary artery disease)   . HTN (hypertension)   . Shortness of breath     PT STATES RELATED TO HIS AF AND HEART BEING OUT OF RHYTHM  . Back pain, chronic     SINCE BACK INJURY / FRACTURE  . GERD (gastroesophageal reflux disease)   . Arthritis   . Cough 06/16/12    C/O OF COUGH / COLD FOR A COUPLE OF WEEKS  . Cancer     RIGHT RENAL  . Aortic valve replaced   . Stomach ulcer   . Constipation   . BPH (benign prostatic hyperplasia)     grapey    Past Surgical History  Procedure Laterality Date  . Aortic valve replacement    . Coronary artery bypass graft    . Thoracic t12 compression fracture    . Radiofrequency ablation kidney      RIGHT (two surgeries)  . Cholecystectomy N/A 09/08/2013    Procedure: LAPAROSCOPIC CHOLECYSTECTOMY WITH INTRAOPERATIVE CHOLANGIOGRAM;  Surgeon: Odis Hollingshead, MD;  Location: [facility];  Service: General;  Laterality: N/A;    There were no vitals taken for this visit.  Visit Diagnosis:  Abnormality of gait  Falls frequently  Activity intolerance      Subjective Assessment - 08/02/14 0840    Symptoms Did exercises only one time because they are hard.   Currently in  Pain? No/denies            [facility] Adult PT Treatment/Exercise - 08/02/14 0800    Ambulation/Gait   Ambulation/Gait Yes   Ambulation/Gait Assistance 4: Min guard   Ambulation Distance (Feet) 100 Feet  2x   Assistive device Straight cane   Gait Pattern Step-through pattern;Decreased stride length;Wide base of support   Knee/Hip Exercises: Aerobic   Stationary Bike NuStep Level 5 with bil. UEs & LEs >50spm X 5 minutes   Balance Exercises   Stand without Upper Extremity Support 10 X from 29" bar stool & 10X from 24" bar stool  PT supervising stabilization /balance & cues on wt shift    Vestibular Exercises Seated without back or arm support: Eyes open & eyes closed - head movements 4 directions 10 X each      PT Education - 08/02/14 0800    Education provided Yes   Education Details dehydration & balance, changed vestibular exercises to seated without back support EO & EC, benefits of fitness /exercises   Person(s) Educated Patient;Spouse   Methods Explanation;Demonstration;Tactile cues   Comprehension Verbalized understanding;Returned demonstration          PT Short Term Goals - 08/02/14 0800    PT SHORT TERM GOAL #  1   Title Demonstrate initial HEP with family assistance (Target Date: 08/25/14)   Time 1   Period Months   Status On-going   PT SHORT TERM GOAL #2   Title Improve TUG score while using cane with supervision to </= 13.5 seconds (Target Date: 08/25/14)   Time 1   Period Months   Status On-going   PT SHORT TERM GOAL #3   Title Improve gait speed to >/= 2.79ft/sec with supervision (Target Date: 08/25/14)   Time 1   Period Months   Status On-going   PT SHORT TERM GOAL #4   Title Perform floor to chair transfer with supervision (Target Date: 08/25/14)   Time 1   Period Months   Status On-going   PT SHORT TERM GOAL #5   Title ambulates 500' with LRAD & negotiates ramp & curb with supervision. (Target Date: 08/25/14)   Time 1   Period Months   Status  On-going          PT Long Term Goals - 08/02/14 0800    PT LONG TERM GOAL #1   Title demonstrates progressive HEP / ongoing fitness plan with family assistance as needed. (Target Date: 09/24/14)   Time 2   Period Months   Status On-going   PT LONG TERM GOAL #2   Title Verbalize understanding of fall prevention strategies with family assistance if needed (Target Date: 09/24/14)   Time 2   Period Months   Status On-going   PT LONG TERM GOAL #3   Title Cognitive TUG <14.5 seconds (Target Date: 09/24/14)   Time 2   Period Months   Status On-going   PT LONG TERM GOAL #4   Title Improve gait speed to >/= 2.62 ft/sec with LRAD (Target Date: 09/24/14)   Time 2   Period Months   Status On-going   PT LONG TERM GOAL #5   Title Improve BERG balance score to >/= 45/56 (Target Date: 09/24/14)   Time 2   Period Months   Status On-going   PT LONG TERM GOAL #6   Title Improve ABC to >30% (Target Date: 09/24/14)   Time 2   Period Months   Status On-going   PT LONG TERM GOAL #7   Title ambulates >500' on various terrain including ramp, curb, stairs with LRAD modified independent (Target Date: 09/24/14)   Time 2   Period Months   Status On-going          Plan - 08/02/14 1352    Clinical Impression Statement Patient was "spinning" with head movements so PT recommended working initially in sitting without back support. Patient & wife seem to understand benefits of exercise.   Pt will benefit from skilled therapeutic intervention in order to improve on the following deficits Abnormal gait;Difficulty walking;Decreased endurance;Decreased safety awareness;Decreased activity tolerance;Decreased balance;Decreased cognition;Decreased mobility;Decreased strength   Rehab Potential Good   PT Frequency 2x / week   PT Treatment/Interventions ADLs/Self Care Home Management;Therapeutic activities;Patient/family education;Therapeutic exercise;Gait training;Balance training;Manual techniques;Neuromuscular  re-education;Energy conservation;Stair training;Functional mobility training;DME Instruction   PT Next Visit Plan Continue with gait and balance activities   PT Home Exercise Plan HEP for hamstring / heelcord stretch & walking program   Consulted and Agree with Plan of Care Patient;Family member/caregiver   Family Member Consulted spouse             Balance Exercises - 08/02/14 1344    Balance Exercises: Standing   Overall Comments --  Seated without back or arm  support: eyes open & eyes closed         Problem List Patient Active Problem List   Diagnosis Date Noted  . Encounter for therapeutic drug monitoring 11/06/2013  . Acute NSTEMI (non-ST elevated myocardial infarction) 09/26/2013  . Chronic calculous cholecystitis 09/08/2013  . Acalculous cholecystitis 09/04/2013  . Abdominal pain, other specified site 09/01/2013  . Constipation 09/01/2013  . Hypotension 09/01/2013  . Leukocytosis 09/01/2013  . Normocytic anemia 09/01/2013  . Hyponatremia 09/01/2013  . Bruit 04/21/2013  . Encounter for long-term (current) use of anticoagulants 11/22/2010  . Chronic diastolic heart failure 27/25/3664  . Aortic valve disorder 10/23/2010  . EDEMA 04/26/2010  . DYSPNEA 04/26/2010  . CHEST PAIN 04/26/2010  . HYPERLIPIDEMIA 01/21/2009  . HYPOKALEMIA 01/21/2009  . HYPERTENSION 01/21/2009  . CAD 01/21/2009  . Atrial fibrillation 01/21/2009  . ALTERED MENTAL STATUS 01/21/2009  . AORTIC VALVE REPLACEMENT, HX OF 01/21/2009    [place]a Woolridge 08/02/2014, 1:57 PM   Jamey Reas, PT, DPT PT Specializing in [place] 08/02/2014 1:59 PM Phone:  [phone]  Fax:  [phone] [place] [address] [place], [place] [address]

## 2014-08-03 ENCOUNTER — Other Ambulatory Visit: Payer: Self-pay | Admitting: Cardiovascular Disease

## 2014-08-04 ENCOUNTER — Encounter: Payer: Self-pay | Admitting: Physical Therapy

## 2014-08-04 ENCOUNTER — Ambulatory Visit: Payer: Medicare Other | Admitting: Physical Therapy

## 2014-08-04 DIAGNOSIS — R6889 Other general symptoms and signs: Secondary | ICD-10-CM

## 2014-08-04 DIAGNOSIS — R269 Unspecified abnormalities of gait and mobility: Secondary | ICD-10-CM

## 2014-08-04 DIAGNOSIS — Z5189 Encounter for other specified aftercare: Secondary | ICD-10-CM | POA: Diagnosis not present

## 2014-08-04 DIAGNOSIS — R296 Repeated falls: Secondary | ICD-10-CM

## 2014-08-04 NOTE — Patient Instructions (Addendum)
  Copyright  VHI. All rights reserved.  Chair Sitting   Sit at edge of seat, spine straight, one leg extended. Pull toes up towards you.  Put hands on knee and bend forward from the hip, keeping spine straight. Allow hand on extended leg to reach toward toes. Support upper body with other arm.  Hold _30__ seconds. Repeat _2__ times per session. Do _2__ sessions per day.  Copyright  VHI. All rights reserved.

## 2014-08-04 NOTE — Therapy (Signed)
[facility] [address] [place], [place], [address] Phone: [phone]   Fax:  [phone]  Physical Therapy Treatment  Patient Details  Name: [name] MRN: [id number] Date of Birth: 12-Jan-1934  Encounter Date: 08/04/2014      PT End of Session - 08/04/14 0904    Visit Number 4   Number of Visits 18   Date for PT Re-Evaluation 09/24/14   PT Start Time 0800   PT Stop Time 0850   PT Time Calculation (min) 50 min   Equipment Utilized During Treatment Gait belt   Activity Tolerance Patient tolerated treatment well   Behavior During Therapy [facility] for tasks assessed/performed      Past Medical History  Diagnosis Date  . Hypokalemia   . Hyperlipidemia   . Atrial fibrillation     longterm persistent  . CAD (coronary artery disease)   . HTN (hypertension)   . Shortness of breath     PT STATES RELATED TO HIS AF AND HEART BEING OUT OF RHYTHM  . Back pain, chronic     SINCE BACK INJURY / FRACTURE  . GERD (gastroesophageal reflux disease)   . Arthritis   . Cough 06/16/12    C/O OF COUGH / COLD FOR A COUPLE OF WEEKS  . Cancer     RIGHT RENAL  . Aortic valve replaced   . Stomach ulcer   . Constipation   . BPH (benign prostatic hyperplasia)     grapey    Past Surgical History  Procedure Laterality Date  . Aortic valve replacement    . Coronary artery bypass graft    . Thoracic t12 compression fracture    . Radiofrequency ablation kidney      RIGHT (two surgeries)  . Cholecystectomy N/A 09/08/2013    Procedure: LAPAROSCOPIC CHOLECYSTECTOMY WITH INTRAOPERATIVE CHOLANGIOGRAM;  Surgeon: Odis Hollingshead, MD;  Location: [facility];  Service: General;  Laterality: N/A;    There were no vitals taken for this visit.  Visit Diagnosis:  Abnormality of gait  Falls frequently  Activity intolerance      Subjective Assessment - 08/04/14 0808    Symptoms Did exercises seated as PT advised.   Currently in Pain? No/denies             [facility] Adult PT Treatment/Exercise - 08/04/14 0800    Transfers   Transfers Sit to Stand;Stand to Sit   Sit to Stand 5: Supervision;With upper extremity assist  from mat table & standard chair   Sit to Stand Details (indicate cue type and reason) verbal & demo cues to work on technique  PT hand wrote notes on proper technique   Stand to Sit 5: Supervision;With upper extremity assist  to mat table & standard chair   Stand to Sit Details verbal & demo cues to work on technique  PT hand wrote notes on proper technique   Ambulation/Gait   Ambulation/Gait Yes   Ambulation/Gait Assistance 4: Min guard   Ambulation Distance (Feet) 100 Feet  2x   Assistive device Straight cane   Gait Pattern Step-through pattern;Decreased stride length;Wide base of support   Balance   Balance Assessed Yes   Dynamic Sitting Balance   Dynamic Sitting - Balance Activities Other (comment)  head movements eyes open & eyes closed   Knee/Hip Exercises: Aerobic   Stationary Bike NuStep Level 5 with bil. UEs & LEs >50spm X 6 minutes          PT Education - 08/04/14  0903    Education provided Yes   Education Details hamstring stretch, reviewed seated head movements, sit to/from stand   Person(s) Educated Patient;Spouse   Methods Explanation;Demonstration;Handout   Comprehension Verbalized understanding;Returned demonstration          PT Short Term Goals - 08/04/14 0800    PT SHORT TERM GOAL #1   Title Demonstrate initial HEP with family assistance (Target Date: 08/25/14)   Time 1   Period Months   Status On-going   PT SHORT TERM GOAL #2   Title Improve TUG score while using cane with supervision to </= 13.5 seconds (Target Date: 08/25/14)   Time 1   Period Months   Status On-going   PT SHORT TERM GOAL #3   Title Improve gait speed to >/= 2.73ft/sec with supervision (Target Date: 08/25/14)   Time 1   Period Months   Status On-going   PT SHORT TERM GOAL #4   Title Perform floor to  chair transfer with supervision (Target Date: 08/25/14)   Time 1   Period Months   Status On-going   PT SHORT TERM GOAL #5   Title ambulates 500' with LRAD & negotiates ramp & curb with supervision. (Target Date: 08/25/14)   Time 1   Period Months   Status On-going          PT Long Term Goals - 08/04/14 0800    PT LONG TERM GOAL #1   Title demonstrates progressive HEP / ongoing fitness plan with family assistance as needed. (Target Date: 09/24/14)   Time 2   Period Months   Status On-going   PT LONG TERM GOAL #2   Title Verbalize understanding of fall prevention strategies with family assistance if needed (Target Date: 09/24/14)   Time 2   Period Months   Status On-going   PT LONG TERM GOAL #3   Title Cognitive TUG <14.5 seconds (Target Date: 09/24/14)   Time 2   Period Months   Status On-going   PT LONG TERM GOAL #4   Title Improve gait speed to >/= 2.62 ft/sec with LRAD (Target Date: 09/24/14)   Time 2   Period Months   Status On-going   PT LONG TERM GOAL #5   Title Improve BERG balance score to >/= 45/56 (Target Date: 09/24/14)   Time 2   Period Months   Status On-going   PT LONG TERM GOAL #6   Title Improve ABC to >30% (Target Date: 09/24/14)   Time 2   Period Months   Status On-going   PT LONG TERM GOAL #7   Title ambulates >500' on various terrain including ramp, curb, stairs with LRAD modified independent (Target Date: 09/24/14)   Time 2   Period Months   Status On-going          Plan - 08/04/14 0905    Clinical Impression Statement patient improved with sit to/from stand with instruction & repetition.   Pt will benefit from skilled therapeutic intervention in order to improve on the following deficits Abnormal gait;Difficulty walking;Decreased endurance;Decreased safety awareness;Decreased activity tolerance;Decreased balance;Decreased cognition;Decreased mobility;Decreased strength   PT Frequency 2x / week   PT Duration 8 weeks   PT  Treatment/Interventions ADLs/Self Care Home Management;Therapeutic activities;Patient/family education;Therapeutic exercise;Gait training;Balance training;Manual techniques;Neuromuscular re-education;Energy conservation;Stair training;Functional mobility training;DME Instruction   PT Next Visit Plan Continue with gait and balance activities   PT Home Exercise Plan Walking program   Consulted and Agree with Plan of Care Patient;Family member/caregiver   Family  Member Consulted wife        Problem List Patient Active Problem List   Diagnosis Date Noted  . Encounter for therapeutic drug monitoring 11/06/2013  . Acute NSTEMI (non-ST elevated myocardial infarction) 09/26/2013  . Chronic calculous cholecystitis 09/08/2013  . Acalculous cholecystitis 09/04/2013  . Abdominal pain, other specified site 09/01/2013  . Constipation 09/01/2013  . Hypotension 09/01/2013  . Leukocytosis 09/01/2013  . Normocytic anemia 09/01/2013  . Hyponatremia 09/01/2013  . Bruit 04/21/2013  . Encounter for long-term (current) use of anticoagulants 11/22/2010  . Chronic diastolic heart failure 50/72/2575  . Aortic valve disorder 10/23/2010  . EDEMA 04/26/2010  . DYSPNEA 04/26/2010  . CHEST PAIN 04/26/2010  . HYPERLIPIDEMIA 01/21/2009  . HYPOKALEMIA 01/21/2009  . HYPERTENSION 01/21/2009  . CAD 01/21/2009  . Atrial fibrillation 01/21/2009  . ALTERED MENTAL STATUS 01/21/2009  . AORTIC VALVE REPLACEMENT, HX OF 01/21/2009    Abhi Moccia 08/04/2014, 9:09 AM  Jamey Reas, PT, DPT PT Specializing in [place] 08/04/2014 9:09 AM Phone:  [phone]  Fax:  [phone] [place] [address] [place], [place] [address]

## 2014-08-05 ENCOUNTER — Encounter (HOSPITAL_COMMUNITY): Payer: Self-pay | Admitting: Interventional Cardiology

## 2014-08-09 ENCOUNTER — Encounter: Payer: Self-pay | Admitting: Physical Therapy

## 2014-08-09 ENCOUNTER — Ambulatory Visit: Payer: Medicare Other | Admitting: Physical Therapy

## 2014-08-09 DIAGNOSIS — Z5189 Encounter for other specified aftercare: Secondary | ICD-10-CM | POA: Diagnosis not present

## 2014-08-09 DIAGNOSIS — R269 Unspecified abnormalities of gait and mobility: Secondary | ICD-10-CM

## 2014-08-09 DIAGNOSIS — R6889 Other general symptoms and signs: Secondary | ICD-10-CM

## 2014-08-09 DIAGNOSIS — R296 Repeated falls: Secondary | ICD-10-CM

## 2014-08-09 NOTE — Therapy (Signed)
[facility] [address] [place], [place], [address] Phone: [phone]   Fax:  [phone]  Physical Therapy Treatment  Patient Details  Name: [name] MRN: [id number] Date of Birth: Dec 25, 1933  Encounter Date: 08/09/2014      PT End of Session - 08/09/14 0840    Visit Number 5   Number of Visits 18   Date for PT Re-Evaluation 09/24/14   PT Start Time 0802   PT Stop Time 0845   PT Time Calculation (min) 43 min   Equipment Utilized During Treatment Gait belt   Activity Tolerance Patient tolerated treatment well   Behavior During Therapy [facility] for tasks assessed/performed      Past Medical History  Diagnosis Date  . Hypokalemia   . Hyperlipidemia   . Atrial fibrillation     longterm persistent  . CAD (coronary artery disease)   . HTN (hypertension)   . Shortness of breath     PT STATES RELATED TO HIS AF AND HEART BEING OUT OF RHYTHM  . Back pain, chronic     SINCE BACK INJURY / FRACTURE  . GERD (gastroesophageal reflux disease)   . Arthritis   . Cough 06/16/12    C/O OF COUGH / COLD FOR A COUPLE OF WEEKS  . Cancer     RIGHT RENAL  . Aortic valve replaced   . Stomach ulcer   . Constipation   . BPH (benign prostatic hyperplasia)     grapey    Past Surgical History  Procedure Laterality Date  . Aortic valve replacement    . Coronary artery bypass graft    . Thoracic t12 compression fracture    . Radiofrequency ablation kidney      RIGHT (two surgeries)  . Cholecystectomy N/A 09/08/2013    Procedure: LAPAROSCOPIC CHOLECYSTECTOMY WITH INTRAOPERATIVE CHOLANGIOGRAM;  Surgeon: Odis Hollingshead, MD;  Location: [facility];  Service: General;  Laterality: N/A;  . Left heart catheterization with coronary/graft angiogram N/A 09/07/2013    Procedure: LEFT HEART CATHETERIZATION WITH Beatrix Fetters;  Surgeon: Sinclair Grooms, MD;  Location: [facility] LAB;  Service: Cardiovascular;  Laterality: N/A;    There  were no vitals taken for this visit.  Visit Diagnosis:  Abnormality of gait  Falls frequently  Activity intolerance      Subjective Assessment - 08/09/14 0809    Symptoms Did some of the exercises. Does not get any symptoms/dizziness with seated head exercises. Going to see urologist on Thursday.    Currently in Pain? No/denies            [facility] Adult PT Treatment/Exercise - 08/09/14 0820    Dynamic Standing Balance   Dynamic Standing - Balance Support Bilateral upper extremity supported;During functional activity   Dynamic Standing - Level of Assistance 5: Stand by assistance   Dynamic Standing - Balance Activities Head nods;Head turns;Eyes open;Eyes closed   Dynamic Standing - Comments in corner with feet apart: eyes closed 20 seconds x3 reps, eyes open head movements all 4 ways x10 each with min guard assist. in parallel bars on balance board with eyes open: static balance without UE assist x3, with finger tip hold head movements up/dpwn and left/right. Performed both ways on balance board with up to min assist for balance. max cues on posture.  Knee/Hip Exercises: Aerobic   Stationary Bike Scifit x4 extremeties L1.5 x 10 minutes with goal rpm >/= 75 for strengthening and activity tolerance                        PT Education - 08/09/14 0840    Education provided Yes   Education Details Hep review and performance at home   Person(s) Educated Patient;Spouse   Methods Explanation;Demonstration   Comprehension Verbalized understanding;Returned demonstration          PT Short Term Goals - 08/09/14 1149    PT SHORT TERM GOAL #1   Title Demonstrate initial HEP with family assistance (Target Date: 08/25/14)   Time 1   Period Months   Status On-going   PT SHORT TERM GOAL #2   Title Improve TUG score while using cane with supervision to </= 13.5 seconds (Target Date: 08/25/14)   Time 1   Period Months   Status On-going   PT SHORT  TERM GOAL #3   Title Improve gait speed to >/= 2.13ft/sec with supervision (Target Date: 08/25/14)   Time 1   Period Months   Status On-going   PT SHORT TERM GOAL #4   Title Perform floor to chair transfer with supervision (Target Date: 08/25/14)   Time 1   Period Months   Status On-going   PT SHORT TERM GOAL #5   Title ambulates 500' with LRAD & negotiates ramp & curb with supervision. (Target Date: 08/25/14)   Time 1   Period Months   Status On-going          PT Long Term Goals - 08/09/14 1149    PT LONG TERM GOAL #1   Title demonstrates progressive HEP / ongoing fitness plan with family assistance as needed. (Target Date: 09/24/14)   Time 2   Period Months   Status On-going   PT LONG TERM GOAL #2   Title Verbalize understanding of fall prevention strategies with family assistance if needed (Target Date: 09/24/14)   Time 2   Period Months   Status On-going   PT LONG TERM GOAL #3   Title Cognitive TUG <14.5 seconds (Target Date: 09/24/14)   Time 2   Period Months   Status On-going   PT LONG TERM GOAL #4   Title Improve gait speed to >/= 2.62 ft/sec with LRAD (Target Date: 09/24/14)   Time 2   Period Months   Status On-going   PT LONG TERM GOAL #5   Title Improve BERG balance score to >/= 45/56 (Target Date: 09/24/14)   Time 2   Period Months   Status On-going   PT LONG TERM GOAL #6   Title Improve ABC to >30% (Target Date: 09/24/14)   Time 2   Period Months   Status On-going   PT LONG TERM GOAL #7   Title ambulates >500' on various terrain including ramp, curb, stairs with LRAD modified independent (Target Date: 09/24/14)   Time 2   Period Months   Status On-going          Plan - 08/09/14 0840    Clinical Impression Statement Pt able to perform corner activities today with min guard assistance. Discussed with spouse/pt to perform the exercises in standing on his good days and seated on days he is feeling  dizzy/weak for maximal benefit and safety. Both  verbalized understanding and agreement. Progressed balance acitivities today without issues.  Pt will benefit from skilled therapeutic intervention in order to improve on the following deficits Abnormal gait;Difficulty walking;Decreased endurance;Decreased safety awareness;Decreased activity tolerance;Decreased balance;Decreased cognition;Decreased mobility;Decreased strength   Rehab Potential Good   PT Frequency 2x / week   PT Duration 8 weeks   PT Treatment/Interventions ADLs/Self Care Home Management;Therapeutic activities;Patient/family education;Therapeutic exercise;Gait training;Balance training;Manual techniques;Neuromuscular re-education;Energy conservation;Stair training;Functional mobility training;DME Instruction   PT Next Visit Plan Continue with gait and balance activities   PT Home Exercise Plan Walking program, HEP   Consulted and Agree with Plan of Care Family member/caregiver   Family Member Consulted wife        Problem List Patient Active Problem List   Diagnosis Date Noted  . Encounter for therapeutic drug monitoring 11/06/2013  . Acute NSTEMI (non-ST elevated myocardial infarction) 09/26/2013  . Chronic calculous cholecystitis 09/08/2013  . Acalculous cholecystitis 09/04/2013  . Abdominal pain, other specified site 09/01/2013  . Constipation 09/01/2013  . Hypotension 09/01/2013  . Leukocytosis 09/01/2013  . Normocytic anemia 09/01/2013  . Hyponatremia 09/01/2013  . Bruit 04/21/2013  . Encounter for long-term (current) use of anticoagulants 11/22/2010  . Chronic diastolic heart failure 78/58/8502  . Aortic valve disorder 10/23/2010  . EDEMA 04/26/2010  . DYSPNEA 04/26/2010  . CHEST PAIN 04/26/2010  . HYPERLIPIDEMIA 01/21/2009  . HYPOKALEMIA 01/21/2009  . HYPERTENSION 01/21/2009  . CAD 01/21/2009  . Atrial fibrillation 01/21/2009  . ALTERED MENTAL STATUS 01/21/2009  . AORTIC VALVE REPLACEMENT, HX OF 01/21/2009    Willow Ora 08/09/2014, 11:50 AM  Willow Ora, PTA, [facility] [address] [place], [place] [address] [phone] 08/09/2014, 11:50 AM

## 2014-08-12 ENCOUNTER — Encounter: Payer: Medicare Other | Admitting: Physical Therapy

## 2014-08-13 ENCOUNTER — Ambulatory Visit (INDEPENDENT_AMBULATORY_CARE_PROVIDER_SITE_OTHER): Payer: Medicare Other | Admitting: *Deleted

## 2014-08-13 DIAGNOSIS — I4891 Unspecified atrial fibrillation: Secondary | ICD-10-CM

## 2014-08-13 DIAGNOSIS — Z952 Presence of prosthetic heart valve: Secondary | ICD-10-CM

## 2014-08-13 DIAGNOSIS — Z7901 Long term (current) use of anticoagulants: Secondary | ICD-10-CM

## 2014-08-13 DIAGNOSIS — Z5181 Encounter for therapeutic drug level monitoring: Secondary | ICD-10-CM

## 2014-08-13 DIAGNOSIS — Z954 Presence of other heart-valve replacement: Secondary | ICD-10-CM

## 2014-08-13 DIAGNOSIS — I359 Nonrheumatic aortic valve disorder, unspecified: Secondary | ICD-10-CM

## 2014-08-13 LAB — POCT INR: INR: 2.2

## 2014-08-17 ENCOUNTER — Encounter: Payer: Medicare Other | Admitting: Physical Therapy

## 2014-08-19 ENCOUNTER — Encounter: Payer: Medicare Other | Admitting: Physical Therapy

## 2014-08-24 ENCOUNTER — Encounter: Payer: Medicare Other | Admitting: Physical Therapy

## 2014-08-26 ENCOUNTER — Encounter: Payer: Medicare Other | Admitting: Physical Therapy

## 2014-08-30 ENCOUNTER — Encounter: Payer: Medicare Other | Admitting: Physical Therapy

## 2014-08-30 ENCOUNTER — Encounter: Payer: Self-pay | Admitting: Neurology

## 2014-08-30 NOTE — Therapy (Signed)
[facility] [address] [place], [place], [address] Phone: [phone]   Fax:  [phone]  Patient Details  Name: [name] MRN: [id number] Date of Birth: [birth year]  Encounter Date: 08/30/2014  PHYSICAL THERAPY DISCHARGE SUMMARY  Visits from Start of Care: 5  Current functional level related to goals / functional outcomes: Patient called to report going to urologist and wished to be discharged from PT. He reports that he will call back if needs PT.  PT LONG TERM GOAL #1    Title  demonstrates progressive HEP / ongoing fitness plan with family assistance as needed. (Target Date: 09/24/14)    Time  2    Period  Months    Status  On-going    PT LONG TERM GOAL #2    Title  Verbalize understanding of fall prevention strategies with family assistance if needed (Target Date: 09/24/14)    Time  2    Period  Months    Status  On-going    PT LONG TERM GOAL #3    Title  Cognitive TUG <14.5 seconds (Target Date: 1/29/ 16)    Time  2    Period  Months    Status  On-going    PT LONG TERM GOAL #4    Title  Improve gait speed to >/= 2.62 ft/sec with LRAD (Target Date: 09/24/14)    Time  2    Period  Months    Status  On-going    PT LONG TERM GOAL #5    Title  Improve BERG balance score to >/= 45/56 (Target Date: 09/24/14)    Time  2    Period  Months    Status  On-going    PT LONG TERM GOAL #6    Title  Improve ABC to >30% (Target Date: 09/24/14)    Time  2    Period  Months    Status  On-going    PT LONG TERM GOAL #7    Title  ambulates >500' on various terrain including ramp, curb, stairs with LRAD modified independent (Target Date: 09/24/14)    Time  2    Period  Months    Status  On-going         Remaining deficits: Patient was progressing towards goals but discharged early per his request. He did not return for last appts so  unknown status as PT unable to retest.   Education / Equipment: Theodoro Parma Rodman Comp plan. Plan: Patient agrees to discharge.  Patient goals were not met. Patient is being discharged due to being pleased with the current functional level.  ?????       Rosevelt Luu PT, DPT 08/30/2014, 8:38 AM  [facility] [address] [place], [place], [address] Phone: [phone]   Fax:  [phone]

## 2014-08-31 ENCOUNTER — Ambulatory Visit (INDEPENDENT_AMBULATORY_CARE_PROVIDER_SITE_OTHER): Payer: BLUE CROSS/BLUE SHIELD | Admitting: Neurology

## 2014-08-31 ENCOUNTER — Encounter: Payer: Self-pay | Admitting: Neurology

## 2014-08-31 VITALS — BP 202/101 | HR 61 | Temp 97.3°F | Ht 73.75 in | Wt 223.0 lb

## 2014-08-31 DIAGNOSIS — I482 Chronic atrial fibrillation, unspecified: Secondary | ICD-10-CM

## 2014-08-31 DIAGNOSIS — R296 Repeated falls: Secondary | ICD-10-CM | POA: Diagnosis not present

## 2014-08-31 DIAGNOSIS — G478 Other sleep disorders: Secondary | ICD-10-CM

## 2014-08-31 DIAGNOSIS — R519 Headache, unspecified: Secondary | ICD-10-CM

## 2014-08-31 DIAGNOSIS — R351 Nocturia: Secondary | ICD-10-CM | POA: Diagnosis not present

## 2014-08-31 DIAGNOSIS — R51 Headache: Secondary | ICD-10-CM | POA: Diagnosis not present

## 2014-08-31 DIAGNOSIS — R0683 Snoring: Secondary | ICD-10-CM

## 2014-08-31 DIAGNOSIS — R269 Unspecified abnormalities of gait and mobility: Secondary | ICD-10-CM

## 2014-08-31 NOTE — Progress Notes (Signed)
Subjective:    Patient ID: [name] is a 81 y.o. male.  HPI     Star Age, MD, PhD [facility] [address] [place], [place] [address]  Dear Dr. Addison Lank,   I saw your patient, [name], upon your kind request in my neurologic clinic today for initial consultation of his gait disorder and recurrent falls. The patient is accompanied by his wife and daughter, [name], today. As you know, Mr. Pons is a 81 year old right-handed gentleman with an underlying medical history of aortic stenosis, coronary artery disease, status post CABG and St. Jude aortic valve replacement in October 2003, chronic atrial fibrillation, hypertension, hyperlipidemia, T12 compression fracture with status post kyphoplasty, diverticulitis, renal cell mass and treatment resistant urinary incontinence, followed by urology, who has had balance problems and recurrent falls for the past 6 months. He has had physical therapy twice a week for about a month which was not necessarily very helpful. He has been advised to use his walker at all times. He has fallen repeatedly. He has had some shuffling of his walking. He has had severe nocturia, 5-6 times per night for the past 5-6 months. He has been on Vesicare, but will switch to Myrbetriq samples soon. He has occasional morning headaches. He does not wake up rested. He snores some. He has never had a sleep study. He drinks 2 cups of coffee, but not enough water. He quit smoking in 81 and does not drink alcohol.  He denies vertigo type symptoms. Sometimes he feels lightheaded when he stands quickly. He is particularly insecure in the dark. He still drives but not in the dark. He has not had any recent blood work. He fell yesterday as he was trying to sit on a little stool which did not carry his weight and broke. He hit his head backwards but does not have any residual symptoms. He did not lose consciousness.   His Past Medical  History Is Significant For: Past Medical History  Diagnosis Date  . Hypokalemia   . Hyperlipidemia   . Atrial fibrillation     longterm persistent  . CAD (coronary artery disease)   . HTN (hypertension)   . Shortness of breath     PT STATES RELATED TO HIS AF AND HEART BEING OUT OF RHYTHM  . Back pain, chronic     SINCE BACK INJURY / FRACTURE  . GERD (gastroesophageal reflux disease)   . Arthritis   . Cough 06/16/12    C/O OF COUGH / COLD FOR A COUPLE OF WEEKS  . Cancer     RIGHT RENAL  . Aortic valve replaced   . Stomach ulcer   . Constipation   . BPH (benign prostatic hyperplasia)     grapey  . Diverticulitis     history  . Right renal mass     renal cell carcinoma  . Kidney disorder     ablation defect in the right kidney lower pole but progression of interpolar lesion: stable on repeat CT (06/2014)  . Elevated fasting glucose 2012    His Past Surgical History Is Significant For: Past Surgical History  Procedure Laterality Date  . Aortic valve replacement    . Coronary artery bypass graft    . Thoracic t12 compression fracture    . Radiofrequency ablation kidney      RIGHT (two surgeries)  . Cholecystectomy N/A 09/08/2013    Procedure: LAPAROSCOPIC CHOLECYSTECTOMY WITH INTRAOPERATIVE CHOLANGIOGRAM;  Surgeon: Rhunette Croft  Rosenbower, MD;  Location: [facility];  Service: General;  Laterality: N/A;  . Left heart catheterization with coronary/graft angiogram N/A 09/07/2013    Procedure: LEFT HEART CATHETERIZATION WITH Beatrix Fetters;  Surgeon: Sinclair Grooms, MD;  Location: [facility] CATH LAB;  Service: Cardiovascular;  Laterality: N/A;  . Knee surgery      His Family History Is Significant For: Family History  Problem Relation Age of Onset  . Hypertension Sister   . Diabetes Sister   . Hypertension Mother   . Diabetes Mother   . Hypertension Brother   . Diabetes Brother     His Social History Is Significant For: History   Social History  . Marital Status:  Married    Spouse Name: N/A    Number of Children: 2  . Years of Education: N/A   Occupational History  .      retired   Social History Main Topics  . Smoking status: Former Smoker -- 2.00 packs/day for 20 years    Types: Cigarettes    Quit date: 08/27/1977  . Smokeless tobacco: Never Used  . Alcohol Use: No  . Drug Use: No  . Sexual Activity: None   Other Topics Concern  . None   Social History Narrative   Lives with his wife, good family support, ambulates without assist device.   Caffeine 2 cups daily.     His Allergies Are:  No Known Allergies:   His Current Medications Are:  Outpatient Encounter Prescriptions as of 08/31/2014  Medication Sig  . acetaminophen (TYLENOL) 325 MG tablet Take 650 mg by mouth every 6 (six) hours as needed for mild pain.   [name] amoxicillin (AMOXIL) 500 MG capsule Take 500 mg by mouth as needed.  [name] aspirin EC 81 MG EC tablet Take 1 tablet (81 mg total) by mouth daily.  . carvedilol (COREG) 25 MG tablet Take 1 tablet (25 mg total) by mouth 2 (two) times daily with a meal.  . cholecalciferol (VITAMIN D) 1000 UNITS tablet Take 1,000 Units by mouth daily.  . digoxin (LANOXIN) 0.125 MG tablet TAKE ONE TABLET BY MOUTH ONCE DAILY  . docusate sodium (COLACE) 50 MG capsule Take 50 mg by mouth 2 (two) times daily.  . Multiple Vitamin (MULTIVITAMIN WITH MINERALS) TABS tablet Take 1 tablet by mouth daily.  . nitroGLYCERIN (NITROSTAT) 0.4 MG SL tablet Place 1 tablet (0.4 mg total) under the tongue every 5 (five) minutes as needed. For chest pain  . ramipril (ALTACE) 5 MG capsule Take 5 mg by mouth daily.  . ranitidine (ZANTAC) 150 MG tablet Take 150 mg by mouth 2 (two) times daily as needed for heartburn.   . Sennosides (SENNA LAX PO) Take 50 mg by mouth as needed.  . Tamsulosin HCl (FLOMAX) 0.4 MG CAPS Take 0.4 mg by mouth daily.   [name] torsemide (DEMADEX) 10 MG tablet Take 1 tablet (10 mg total) by mouth 2 (two) times daily.  . vitamin B-12 (CYANOCOBALAMIN)  1000 MCG tablet Take 1,000 mcg by mouth daily.    [name] warfarin (COUMADIN) 5 MG tablet Take as directed by [facility]  . potassium chloride (KLOR-CON) 8 MEQ tablet Take 1 tablet (8 mEq total) by mouth daily. (Patient not taking: Reported on 08/31/2014)  . [DISCONTINUED] solifenacin (VESICARE) 10 MG tablet Take 10 mg by mouth daily.   :   Review of Systems:  Out of a complete 14 point review of systems, all are reviewed and negative with the exception of these  symptoms as listed below:   Review of Systems  Constitutional:       Weight loss  HENT: Positive for hearing loss.        Ringing in ears, spinning sensation  Gastrointestinal: Positive for nausea and constipation.       Incontinence  Skin:       moles  Neurological: Positive for headaches.  Hematological: Bruises/bleeds easily.    Objective:  Neurologic Exam  Physical Exam Physical Examination:   Filed Vitals:   08/31/14 1304  BP: 202/101  Pulse: 61  Temp: 97.3 F (36.3 C)    General Examination: The patient is a very pleasant 81 y.o. male in no acute distress. He appears well-developed and well-nourished and well groomed. He currently denies any vertigo, lightheadedness upon standing, chest pain, shortness of breath, headache or blurry vision.  HEENT: Normocephalic, atraumatic, pupils are equal, round and reactive to light and accommodation. Funduscopic exam is normal with sharp disc margins noted. Extraocular tracking is good without limitation to gaze excursion or nystagmus noted. Normal smooth pursuit is noted. Hearing is grossly intact. He is hard of hearing and has a hearing aid in place on the right side. Face is symmetric with normal facial animation and normal facial sensation. Speech is clear with no dysarthria noted. There is no hypophonia. There is no lip, neck/head, jaw or voice tremor. Neck is supple with full range of passive and active motion. There are no carotid bruits on auscultation. Oropharynx exam  reveals: moderate mouth dryness, adequate dental hygiene and moderate airway crowding, due to redundant soft palate and wider uvula. Tonsils are small. Mallampati is class II. Tongue protrudes centrally and palate elevates symmetrically.   Chest: Clear to auscultation without wheezing, rhonchi or crackles noted.  Heart: Irregular and he has a systolic murmur.    Abdomen: Soft, non-tender and non-distended with normal bowel sounds appreciated on auscultation.  Extremities: There is 1+ pitting edema in the distal lower extremities bilaterally. Pedal pulses are intact.  Skin: Warm and dry without trophic changes noted. There are no varicose veins.  Musculoskeletal: exam reveals no obvious joint deformities, tenderness or joint swelling or erythema.   Neurologically:  Mental status: The patient is awake, alert and oriented in all 4 spheres. His immediate and remote memory, attention, language skills and fund of knowledge are appropriate. There is no evidence of aphasia, agnosia, apraxia or anomia. Speech is clear with normal prosody and enunciation. Thought process is linear. Mood is normal and affect is normal.  Cranial nerves II - XII are as described above under HEENT exam. In addition: shoulder shrug is normal with equal shoulder height noted. Motor exam: Normal bulk, strength and tone is noted. There is no drift, tremor or rebound. Romberg is positive. Reflexes are 2+ throughout. Babinski: Toes are flexor bilaterally. Fine motor skills and coordination: intact with normal finger taps, normal hand movements, normal rapid alternating patting, normal foot taps and normal foot agility.  Cerebellar testing: No dysmetria or intention tremor on finger to nose testing. Heel to shin is unremarkable bilaterally. There is no truncal ataxia.  Sensory exam: intact to light touch, pinprick, vibration, temperature sense in the upper and lower extremities, with the exception of mild decrease in vibration sense  in the distal lower extremities.  Gait, station and balance: He stands with difficulty. No veering to one side is noted. His upper body is slightly tilted to the right. Posture is  mildly stooped and may be age-appropriate and stance is  mildly wide-based. He is not able to securely walk without a walker. He uses a 2 wheeled walker. He has no significant shuffling. He turns slowly. Tandem walk is not possible.  Assessment and Plan:   In summary, SHANDELL JALLOW is a very pleasant 81 y.o.-year old male with an underlying medical history of aortic stenosis, coronary artery disease, status post CABG and St. Jude aortic valve replacement in October 2003, chronic atrial fibrillation, hypertension, hyperlipidemia, T12 compression fracture with status post kyphoplasty, diverticulitis, renal cell mass and treatment resistant urinary incontinence, followed by urology, who has had balance problems and recurrent falls for the past 6 months.  on examination he has evidence of a gait disorder but no focal neurological findings. He most likely has a multifactorial gait disturbance secondary to aging, degenerative back disease with abnormal posture, possible medication side effects, in particular with anticholinergic medications, dehydration, and in the context of nonrestorative sleep. He may have underlying obstructive sleep apnea. He reports morning headaches, significant nocturia, nonrestorative sleep, and in light of his A. fib I would recommend that he undergo a sleep study and consider treatment for obstructive sleep apnea the need arises. He is reluctant but agrees. I would like for him to have his blood work through your office unless he has had some in the last 5-6 months. I would include thyroid function, B12 level, electrolytes, kidney and liver function. He is advised to get in touch with your office in that regard. I would like to do a brain MRI with and without contrast. He has had recurrent falls and is  strongly advised to change positions slowly, drink more water, and uses 2 wheeled walker at all times for safety. I will see him back after his tests are done. I did not suggest any new medications at this time. Thank you very much for allowing me to participate in the care of this nice patient. If I can be of any further assistance to you please do not hesitate to call me at [phone].  Sincerely,   Star Age, MD, PhD

## 2014-08-31 NOTE — Patient Instructions (Addendum)
I believe you have a gait disorder, which likely is due to a combination of things: normal aging, degenerative arthritis of your back, and possible atherosclerosis of the blood vessels in your brain.   Remember to drink plenty of fluid, eat healthy meals and do not skip any meals. Try to eat protein with a every meal and eat a healthy snack such as fruit or nuts in between meals. Try to keep a regular sleep-wake schedule and try to exercise daily, particularly in the form of walking, 20-30 minutes a day, if you can. Change positions slowly and you should use your walker at all times.     As far as diagnostic testing: we will do a MRI brain and a sleep study and call you with the results.   We will let you do blood work with Dr. Addison Lank.

## 2014-09-01 ENCOUNTER — Encounter: Payer: Medicare Other | Admitting: Physical Therapy

## 2014-09-06 ENCOUNTER — Encounter: Payer: Medicare Other | Admitting: Physical Therapy

## 2014-09-08 ENCOUNTER — Encounter: Payer: Medicare Other | Admitting: Physical Therapy

## 2014-09-08 ENCOUNTER — Ambulatory Visit (INDEPENDENT_AMBULATORY_CARE_PROVIDER_SITE_OTHER): Payer: Medicare Other | Admitting: *Deleted

## 2014-09-08 DIAGNOSIS — Z7901 Long term (current) use of anticoagulants: Secondary | ICD-10-CM

## 2014-09-08 DIAGNOSIS — I359 Nonrheumatic aortic valve disorder, unspecified: Secondary | ICD-10-CM

## 2014-09-08 DIAGNOSIS — I4891 Unspecified atrial fibrillation: Secondary | ICD-10-CM

## 2014-09-08 DIAGNOSIS — Z954 Presence of other heart-valve replacement: Secondary | ICD-10-CM

## 2014-09-08 DIAGNOSIS — Z5181 Encounter for therapeutic drug level monitoring: Secondary | ICD-10-CM

## 2014-09-08 DIAGNOSIS — Z952 Presence of prosthetic heart valve: Secondary | ICD-10-CM

## 2014-09-08 LAB — POCT INR: INR: 3.2

## 2014-09-10 ENCOUNTER — Ambulatory Visit (INDEPENDENT_AMBULATORY_CARE_PROVIDER_SITE_OTHER): Payer: BLUE CROSS/BLUE SHIELD | Admitting: Neurology

## 2014-09-10 VITALS — BP 144/91 | HR 81

## 2014-09-10 DIAGNOSIS — R9431 Abnormal electrocardiogram [ECG] [EKG]: Secondary | ICD-10-CM

## 2014-09-10 DIAGNOSIS — G4733 Obstructive sleep apnea (adult) (pediatric): Secondary | ICD-10-CM

## 2014-09-10 DIAGNOSIS — G4761 Periodic limb movement disorder: Secondary | ICD-10-CM

## 2014-09-10 DIAGNOSIS — F518 Other sleep disorders not due to a substance or known physiological condition: Secondary | ICD-10-CM

## 2014-09-10 DIAGNOSIS — G479 Sleep disorder, unspecified: Secondary | ICD-10-CM

## 2014-09-11 NOTE — Sleep Study (Signed)
Please see the scanned sleep study interpretation located in the Procedure tab within the Chart review section

## 2014-09-13 ENCOUNTER — Encounter: Payer: Medicare Other | Admitting: Physical Therapy

## 2014-09-15 ENCOUNTER — Encounter: Payer: Medicare Other | Admitting: Physical Therapy

## 2014-09-16 ENCOUNTER — Telehealth: Payer: Self-pay | Admitting: Neurology

## 2014-09-16 DIAGNOSIS — G4733 Obstructive sleep apnea (adult) (pediatric): Secondary | ICD-10-CM

## 2014-09-20 ENCOUNTER — Encounter: Payer: Medicare Other | Admitting: Physical Therapy

## 2014-09-22 ENCOUNTER — Encounter: Payer: Medicare Other | Admitting: Physical Therapy

## 2014-09-22 NOTE — Telephone Encounter (Signed)
Please call and notify patient that the recent sleep study confirmed the diagnosis of OSA. He did well with CPAP during the study with significant improvement of the respiratory events. Therefore, I would like start the patient on CPAP at home. I placed the order in the chart.   Arrange for CPAP set up at home through a DME company of patient's choice and fax/route report to PCP and referring MD (if other than PCP).   The patient will also need a follow up appointment with me in 6-8 weeks post set up that has to be scheduled; help the patient schedule this (in a follow-up slot).   Please re-enforce the importance of compliance with treatment and the need for us to monitor compliance data.   Once you have spoken to the patient and scheduled the return appointment, you may close this encounter, thanks,   Jene Huq, MD, PhD [facility]    

## 2014-09-23 ENCOUNTER — Encounter: Payer: Self-pay | Admitting: Neurology

## 2014-09-27 ENCOUNTER — Encounter: Payer: Self-pay | Admitting: *Deleted

## 2014-09-27 NOTE — Telephone Encounter (Signed)
Patient's wife was contacted and provided the results of her husbands split night sleep study in which severe obstructive sleep apnea was observed.  She was informed that treatment for her husband in the form of CPAP therapy was advised given his medical history and sleep complaints.  She was in agreement and he was referred to [facility] for CPAP set up.  Dr. Cari Caraway was faxed a copy of the report.  The patients wife gave verbal permission to mail a copy of his test results.   Patient instructed to contact our office 6-8 weeks post set up to schedule a follow up appointment.

## 2014-09-29 ENCOUNTER — Other Ambulatory Visit: Payer: Self-pay | Admitting: Cardiovascular Disease

## 2014-10-04 NOTE — Progress Notes (Signed)
Patient ID: [name], male   DOB: [birth year], 81 y.o.   MRN: [id number] He has a history of CAD, Aortic stenosis, status post CABG and St. Jude aortic valve replacement 05/2002, permanent atrial fibrillation, hypertension, hyperlipidemia. Last nuclear study 04/2010: Low-risk study with mild fixed basal to mid inferior perfusion defect defect representing diaphragmatic attenuation versus prior infarct, no ischemia.  Saw PA in June and amlodipine added for elevated BP. Improved TSH and BMET ordered and normal Memory has gotten quite poor   Hospitalized for abdominal pain  1/15  Troponins elevated no acute ECG changes EF 65% by echo Cath done prior to surgery 09/07/13  OK and medical Rx recommended   Echo 09/03/13 Study Conclusions  - Left ventricle: The cavity size was normal. Wall thickness was increased in a pattern of mild LVH. Systolic function was normal. The estimated ejection fraction was in the range of 55% to 60%. - Aortic valve: AV prosthesis opens well. Peak and mean gradients through the valve are 29 and 15 mm Hg respectively. Trivial regurgitation. - Mitral valve: Mild regurgitation. - Pulmonary arteries: PA peak pressure: 30mm Hg (S).  Some right knee pain and sub xiphoid prominence  Not happy with Dr Risa Grill urology.  Spastic bladder medicine vesicare and other too expensive  Wearing "pad"    ROS: Denies fever, malais, weight loss, blurry vision, decreased visual acuity, cough, sputum, SOB, hemoptysis, pleuritic pain, palpitaitons, heartburn, abdominal pain, melena, lower extremity edema, claudication, or rash.  All other systems reviewed and negative  General: Affect appropriate Healthy:  appears stated age 72: normal Neck supple with no adenopathy JVP normal no bruits no thyromegaly Lungs clear with no wheezing and good diaphragmatic motion Heart:  V9/D6 click SEM no AR murmur, no rub, gallop or click PMI normal Abdomen: benighn, BS positve, no tenderness, no  AAA no bruit.  No HSM or HJR Distal pulses intact with no bruits No edema Neuro non-focal Skin warm and dry No muscular weakness   Current Outpatient Prescriptions  Medication Sig Dispense Refill  . acetaminophen (TYLENOL) 325 MG tablet Take 650 mg by mouth every 6 (six) hours as needed for mild pain.     [name] amoxicillin (AMOXIL) 500 MG capsule Take 500 mg by mouth as needed.    [name] aspirin EC 81 MG EC tablet Take 1 tablet (81 mg total) by mouth daily. 30 tablet 0  . carvedilol (COREG) 25 MG tablet Take 1 tablet (25 mg total) by mouth 2 (two) times daily with a meal. 60 tablet 11  . cholecalciferol (VITAMIN D) 1000 UNITS tablet Take 1,000 Units by mouth daily.    . digoxin (LANOXIN) 0.125 MG tablet TAKE ONE TABLET BY MOUTH ONCE DAILY 90 tablet 2  . docusate sodium (COLACE) 50 MG capsule Take 50 mg by mouth 2 (two) times daily.    . Multiple Vitamin (MULTIVITAMIN WITH MINERALS) TABS tablet Take 1 tablet by mouth daily.    . nitroGLYCERIN (NITROSTAT) 0.4 MG SL tablet Place 1 tablet (0.4 mg total) under the tongue every 5 (five) minutes as needed. For chest pain 25 tablet 4  . potassium chloride (KLOR-CON) 8 MEQ tablet Take 1 tablet (8 mEq total) by mouth daily. (Patient not taking: Reported on 08/31/2014) 30 tablet 11  . ramipril (ALTACE) 5 MG capsule Take 5 mg by mouth daily.    . ranitidine (ZANTAC) 150 MG tablet Take 150 mg by mouth 2 (two) times daily as needed for heartburn.     . Sennosides (  SENNA LAX PO) Take 50 mg by mouth as needed.    . Tamsulosin HCl (FLOMAX) 0.4 MG CAPS Take 0.4 mg by mouth daily.     [name] torsemide (DEMADEX) 10 MG tablet Take 1 tablet (10 mg total) by mouth 2 (two) times daily. 60 tablet 11  . vitamin B-12 (CYANOCOBALAMIN) 1000 MCG tablet Take 1,000 mcg by mouth daily.      [name] warfarin (COUMADIN) 5 MG tablet Take as directed by [facility] 180 tablet 1   No current facility-administered medications for this visit.    Allergies  Review of patient's allergies  indicates no known allergies.  Electrocardiogram:  afib rate 74 otherwise normal  04/05/14   Assessment and Plan

## 2014-10-05 ENCOUNTER — Ambulatory Visit (INDEPENDENT_AMBULATORY_CARE_PROVIDER_SITE_OTHER): Payer: Medicare Other

## 2014-10-05 ENCOUNTER — Ambulatory Visit (INDEPENDENT_AMBULATORY_CARE_PROVIDER_SITE_OTHER): Payer: Medicare Other | Admitting: Cardiovascular Disease

## 2014-10-05 ENCOUNTER — Encounter: Payer: Self-pay | Admitting: Cardiovascular Disease

## 2014-10-05 VITALS — BP 140/88 | HR 54 | Ht 76.0 in | Wt 221.4 lb

## 2014-10-05 DIAGNOSIS — I359 Nonrheumatic aortic valve disorder, unspecified: Secondary | ICD-10-CM

## 2014-10-05 DIAGNOSIS — Z952 Presence of prosthetic heart valve: Secondary | ICD-10-CM

## 2014-10-05 DIAGNOSIS — E785 Hyperlipidemia, unspecified: Secondary | ICD-10-CM

## 2014-10-05 DIAGNOSIS — Z954 Presence of other heart-valve replacement: Secondary | ICD-10-CM

## 2014-10-05 DIAGNOSIS — I4891 Unspecified atrial fibrillation: Secondary | ICD-10-CM

## 2014-10-05 DIAGNOSIS — I2510 Atherosclerotic heart disease of native coronary artery without angina pectoris: Secondary | ICD-10-CM

## 2014-10-05 DIAGNOSIS — I10 Essential (primary) hypertension: Secondary | ICD-10-CM

## 2014-10-05 DIAGNOSIS — I482 Chronic atrial fibrillation, unspecified: Secondary | ICD-10-CM

## 2014-10-05 DIAGNOSIS — Z5181 Encounter for therapeutic drug level monitoring: Secondary | ICD-10-CM

## 2014-10-05 DIAGNOSIS — N318 Other neuromuscular dysfunction of bladder: Secondary | ICD-10-CM

## 2014-10-05 DIAGNOSIS — N3289 Other specified disorders of bladder: Secondary | ICD-10-CM | POA: Insufficient documentation

## 2014-10-05 DIAGNOSIS — Z7901 Long term (current) use of anticoagulants: Secondary | ICD-10-CM

## 2014-10-05 LAB — POCT INR: INR: 3

## 2014-10-05 NOTE — Assessment & Plan Note (Signed)
Not happy with Dr Risa Grill who is leaving anyway.  Refer to Dr Louis Meckel  See if he can get on an affordable med for bladder spasm

## 2014-10-05 NOTE — Patient Instructions (Signed)
Your physician wants you to follow-up in:   [place] will receive a reminder letter in the mail two months in advance. If you don't receive a letter, please call our office to schedule the follow-up appointment. Your physician recommends that you continue on your current medications as directed. Please refer to the Current Medication list given to you today.   You have been referred to [place]

## 2014-10-05 NOTE — Assessment & Plan Note (Signed)
Post AVR normal function by echo no new murmurs  SBE  On coumadin

## 2014-10-05 NOTE — Assessment & Plan Note (Signed)
Cholesterol is at goal.  Continue current dose of statin and diet Rx.  No myalgias or side effects.  F/U  LFT's in 6 months. No results found for: LDLCALC           

## 2014-10-05 NOTE — Assessment & Plan Note (Signed)
Well controlled.  Continue current medications and low sodium Dash type diet.    

## 2014-10-05 NOTE — Assessment & Plan Note (Signed)
Stable with no angina and good activity level.  Continue medical Rx  

## 2014-10-05 NOTE — Assessment & Plan Note (Signed)
Good rate control and anticoagulation INR Rx today no bleeding issue  Increasing fall risk

## 2014-10-13 ENCOUNTER — Other Ambulatory Visit: Payer: Self-pay | Admitting: *Deleted

## 2014-10-13 MED ORDER — NITROGLYCERIN 0.4 MG SL SUBL: 0.4000 mg | SUBLINGUAL_TABLET | SUBLINGUAL | Status: DC | PRN

## 2014-10-24 ENCOUNTER — Other Ambulatory Visit: Payer: Self-pay | Admitting: Cardiovascular Disease

## 2014-10-25 ENCOUNTER — Telehealth: Payer: Self-pay

## 2014-10-26 ENCOUNTER — Other Ambulatory Visit: Payer: Self-pay | Admitting: Cardiovascular Disease

## 2014-10-26 NOTE — Telephone Encounter (Signed)
PT'S WIFE  AWARE  FORM FILLED  OUT  AND  FAXED  RE   PT  NEEDING   TO  CONTINUE  WITH  TORSEMIDE  THERAPY AWAITING    RESPONSE .Adonis Housekeeper

## 2014-10-27 ENCOUNTER — Telehealth: Payer: Self-pay | Admitting: Cardiovascular Disease

## 2014-10-27 NOTE — Telephone Encounter (Signed)
Triage will look into the denial of refill, DR/Nurse are out the rest of the week.

## 2014-10-27 NOTE — Telephone Encounter (Signed)
Pt medication, Torsemide,  Denied by [employer].  Started process of expedited appeal on  3/2.  Contacted pt, spoke with his wife, to find out if pt has ever taken any other medications that could be useful in the appeal process.  She was unsure as the pt has been on so many different medications now and in the past.  Advised her that we have sent in an appeal and should hear something within 72 hours.  Pt currently still has a supply of the medication from the temporary supply sent to him in January.

## 2014-10-27 NOTE — Telephone Encounter (Signed)
New message      Torsemide was not approved.  Pt must have tried 2 medications that is on the formulary list.

## 2014-10-27 NOTE — Telephone Encounter (Signed)
Follow up      Letting you know she received the appeal. Please fax any clinical information to [phone]

## 2014-10-28 NOTE — Telephone Encounter (Signed)
F/U        [employer] calling in regards to an expedited medication request,    What other medications has the pt tried and failed?  Please return call to [phone].

## 2014-10-28 NOTE — Telephone Encounter (Signed)
Lasix does not work for [employer]

## 2014-10-28 NOTE — Telephone Encounter (Signed)
Spoke with Rep from [facility] and informed her that the sheet faxed over stated that the pt has tried HCTZ before but it was not effective. Rep took down this information and said she would send it over to the pharmacy to review and they would send a response to prior auth. Will forward this information to [facility], Dr. Kyla Balzarine nurse for review and follow up.

## 2014-10-29 ENCOUNTER — Other Ambulatory Visit: Payer: Self-pay

## 2014-10-29 MED ORDER — WARFARIN SODIUM 5 MG PO TABS: ORAL_TABLET | ORAL | Status: DC

## 2014-11-02 ENCOUNTER — Ambulatory Visit (INDEPENDENT_AMBULATORY_CARE_PROVIDER_SITE_OTHER): Payer: Medicare Other | Admitting: *Deleted

## 2014-11-02 DIAGNOSIS — Z952 Presence of prosthetic heart valve: Secondary | ICD-10-CM

## 2014-11-02 DIAGNOSIS — I4891 Unspecified atrial fibrillation: Secondary | ICD-10-CM

## 2014-11-02 DIAGNOSIS — Z7901 Long term (current) use of anticoagulants: Secondary | ICD-10-CM

## 2014-11-02 DIAGNOSIS — Z5181 Encounter for therapeutic drug level monitoring: Secondary | ICD-10-CM

## 2014-11-02 DIAGNOSIS — I359 Nonrheumatic aortic valve disorder, unspecified: Secondary | ICD-10-CM

## 2014-11-02 DIAGNOSIS — Z954 Presence of other heart-valve replacement: Secondary | ICD-10-CM

## 2014-11-02 LAB — POCT INR: INR: 3.7

## 2014-11-08 NOTE — Telephone Encounter (Signed)
LM TO CALL BACK  CHECKING ON TORSEMIDE  STATUS .Adonis Housekeeper

## 2014-11-10 ENCOUNTER — Encounter: Payer: Self-pay | Admitting: Neurology

## 2014-11-10 NOTE — Telephone Encounter (Signed)
SEE OTHER PHONE NOTE./CY 

## 2014-11-10 NOTE — Telephone Encounter (Addendum)
SPOKE WITH PT'S WIFE  THINKS  PT  MAY HAVE  MED FILLED  HAVE  LEFT  COUPLE OF MESSAGES WITH [employer]  AND  NO ONE  WILL RETURN CALL NOT  SURE  IF MED  HAS BEEN OKAYED OR  DENIED .Adonis Housekeeper

## 2014-11-11 ENCOUNTER — Telehealth: Payer: Self-pay | Admitting: Cardiovascular Disease

## 2014-11-11 NOTE — Telephone Encounter (Signed)
New message       Calling regarding appeal for torsemide.  Already been appealed and the denial was upheld.  Next level of appeal will be with [employer] at [phone] if you want to do that.

## 2014-11-11 NOTE — Telephone Encounter (Signed)
PER PT'S WIFE  ONLY HAS  RECEIVED  TEMP SUPPLY  OF  TORSEMIDE./CY

## 2014-11-11 NOTE — Telephone Encounter (Signed)
APPEAL WAS  DONE  TODAY   AND  WAS  TO  BE  EXPEDITED  WILL TAKE  72  HOURS  FOR  REVIEW  .Adonis Housekeeper

## 2014-11-15 LAB — PROTIME-INR: INR: 3.1 — AB (ref 0.9–1.1)

## 2014-11-15 NOTE — Telephone Encounter (Signed)
REQUEST  FOR  RECONSIDERATION FORM FILLED  OUT  AND  FAXED  WILL AWAIT  RESPONSE./CY

## 2014-11-15 NOTE — Telephone Encounter (Signed)
SECOND  APPEAL WAS  DENIED  SEE OTHER  PHONE  NOTE .Adonis Housekeeper

## 2014-11-18 NOTE — Telephone Encounter (Signed)
TORSEMIDE HAS  BEEN DENIED FOR  3 RD  TIME  SPOKE  WITH PHARMACY   OUT OF POCKET  CHARGE  WOULD  BE  ABOUT  $44.00 PER  MONTH WILL SEE IF  PT  CAN  AFFORD . LM FOR  PT'S WIFE  TO CALL BACK .CY

## 2014-11-18 NOTE — Telephone Encounter (Signed)
LM  WITH ADMINISTRATIVE  LAW  JUDGE  TO  REQUEST   EXPEDITED  PART  D  APPEAL BY PHONE REQUESTED INFO  LEFT  ON MESSAGE  PT  ONLY HAS  20 TABS LEFT  OF TORSEMIDE .Adonis Housekeeper

## 2014-11-18 NOTE — Telephone Encounter (Signed)
PT'S WIFE  NOTIFIED  IS  GOING TO SEE  WHAT  OUT OF POCKET  EXPENSE  MED  HAS  WITH INSURANCE  TO  SEE IF  THERE IS MUCH OF  DIFFERENCE . WILL CALL BACK  WITH INFO /CY

## 2014-11-19 NOTE — Progress Notes (Signed)
Quick Note:  I reviewed the patient's CPAP compliance data from 10/08/2014 through 11/06/2014 which is a total of 30 days during which time he used his machine every night with percent used days greater than 4 hours at 97%, indicating excellent compliance with an average usage for all nights of 5 hours and 2 minutes, residual AHI low at 1 per hour and leak acceptable with the 95th percentile at 18.8 L/m on a pressure of 10 cm with EPR of 3. The patient has an appointment with me on 11/29/2014 at 9:15 AM. No action required on this.  Star Age, MD, PhD [facility] ([facility])  ______

## 2014-11-23 ENCOUNTER — Telehealth: Payer: Self-pay | Admitting: Cardiovascular Disease

## 2014-11-23 ENCOUNTER — Ambulatory Visit (INDEPENDENT_AMBULATORY_CARE_PROVIDER_SITE_OTHER): Payer: Medicare Other | Admitting: *Deleted

## 2014-11-23 DIAGNOSIS — Z7901 Long term (current) use of anticoagulants: Secondary | ICD-10-CM | POA: Diagnosis not present

## 2014-11-23 DIAGNOSIS — Z954 Presence of other heart-valve replacement: Secondary | ICD-10-CM

## 2014-11-23 DIAGNOSIS — Z5181 Encounter for therapeutic drug level monitoring: Secondary | ICD-10-CM

## 2014-11-23 DIAGNOSIS — I359 Nonrheumatic aortic valve disorder, unspecified: Secondary | ICD-10-CM

## 2014-11-23 DIAGNOSIS — I4891 Unspecified atrial fibrillation: Secondary | ICD-10-CM | POA: Diagnosis not present

## 2014-11-23 DIAGNOSIS — Z952 Presence of prosthetic heart valve: Secondary | ICD-10-CM

## 2014-11-23 LAB — POCT INR: INR: 3.2

## 2014-11-23 NOTE — Telephone Encounter (Signed)
LMTCB 3/29 @1610 /pe

## 2014-11-23 NOTE — Telephone Encounter (Signed)
New Message       Dione calling from Medicare appeals office calling in regards to pt's appeal. She states that this is urgent and she needs a call back as soon as possible.

## 2014-11-23 NOTE — Telephone Encounter (Signed)
LMTCB 3/29 @1655 /pe

## 2014-11-24 NOTE — Telephone Encounter (Signed)
Spoke with [name] about the AL-Judge part D for Torsemide. She will call back Monday around 12 noon to speak with christine about Dr. Kyla Balzarine availability for a 20 minute telephone conference on either of the following Mondays in May  May 9th, May 16, or May 23rd. She states there is now no availability for a telephone conference in April  Forwarded to [place] & Dr. Gearldine Bienenstock RN

## 2014-11-25 NOTE — Telephone Encounter (Signed)
Would have to schedule phone time into office schedule  as office visit don't have time for 20 minute phone call

## 2014-11-29 ENCOUNTER — Encounter: Payer: Self-pay | Admitting: Neurology

## 2014-11-29 ENCOUNTER — Ambulatory Visit (INDEPENDENT_AMBULATORY_CARE_PROVIDER_SITE_OTHER): Payer: Medicare Other | Admitting: Neurology

## 2014-11-29 VITALS — BP 178/80 | HR 82 | Resp 18 | Ht 76.0 in | Wt 218.0 lb

## 2014-11-29 DIAGNOSIS — G4733 Obstructive sleep apnea (adult) (pediatric): Secondary | ICD-10-CM | POA: Diagnosis not present

## 2014-11-29 DIAGNOSIS — I482 Chronic atrial fibrillation, unspecified: Secondary | ICD-10-CM

## 2014-11-29 DIAGNOSIS — R296 Repeated falls: Secondary | ICD-10-CM

## 2014-11-29 DIAGNOSIS — R269 Unspecified abnormalities of gait and mobility: Secondary | ICD-10-CM | POA: Diagnosis not present

## 2014-11-29 NOTE — Progress Notes (Addendum)
Subjective:    Patient ID: [name] is a 81 y.o. male.  HPI     Interim history:   [name] is a 81 year old right-handed gentleman with an underlying medical history of aortic stenosis, coronary artery disease, status post CABG and St. Jude aortic valve replacement in October 2003, chronic atrial fibrillation, hypertension, hyperlipidemia, T12 compression fracture with status post kyphoplasty, diverticulitis, renal cell mass and treatment resistant urinary incontinence, followed by urology, who presents for follow-up consultation of his gait disorder and balance problems. The patient is unaccompanied today and states he drove himself to the appointment. I first met him on 08/31/2014 at the request of his primary care physician, at which time he reported a 6 month history of balance problems and recurrent falls. He also reported nonrestorative sleep, daytime tiredness and snoring. At the time of his initial visit, I felt he most likely had a multifactorial gait disturbance secondary to aging, degenerative back disease with abnormal posture, possible medication side effects, in particular with anticholinergic medications, dehydration, and in the context of nonrestorative sleep. He had symptoms of OSA. I advised him to have a sleep study. He had a split-night sleep study on 09/10/2014 and went over his test results with him in detail today. Baseline sleep efficiency was 72.7%, latency to sleep was 24 minutes, wake after sleep onset was 10.5 minutes with mild sleep fragmentation noted. He had an elevated arousal index. He had absence of REM sleep prior to CPAP initiation. He had A. fib on EKG. He had severe periodic leg movements but minimal arousals. He had mild to moderate and loud snoring. Total AHI was 54.6 per hour. Baseline oxygen saturation was 93%, nadir was 86%. He was titrated on CPAP during the rest of the study starting at approximately 11:10 PM. Arousal index was normal. REM sleep was  6.6% after CPAP initiation. Average oxygen saturation was 94%, nadir was 91%. He had no significant PLMS during the treatment portion of the study. CPAP was titrated from 5-11 cm and his AHI was 0 per hour on a pressure of 10 cm. Based on his sleep test results are prescribed CPAP therapy for home use. I also requested that he have a brain MRI but this was not done and he is not sure why it was not done and was not aware of the MRI order.  Today, 11/29/2014: I reviewed his compliance data with CPAP therapy from 10/25/2014 through 11/23/2014 which is a total of 30 days during which time he used his machine every night with percent used days greater than 4 hours at 100%, indicating superb compliance with an average usage of 4 hours and 27 minutes for all nights. Residual AHI low at 0.7 per hour. Leak acceptable with the 95th percentile at 15 L/m on a pressure of 10 cm with EPR of 3.  Today, 11/29/2014: He reports that he had trouble adjusting to the CPAP but has been compliant with it since. He does not snore any longer. He's not sure if he sleeps much better than before but he does wake up fairly well rested. He has not fallen recently. He uses a cane. He does not have much in the way of RLS symptoms. He had not had any medication changes recently. He had some blood work recently through his primary care physician. We will request those results. He may not drink enough water. He does not walk regularly. His knees bother him particularly his right side. He is getting a procedure for  his bladder hyperactivity.    Previously:   Has had balance problems and recurrent falls for the past 6 months. He has had physical therapy twice a week for about a month which was not necessarily very helpful. He has been advised to use his walker at all times. He has fallen repeatedly. He has had some shuffling of his walking. He has had severe nocturia, 5-6 times per night for the past 5-6 months. He has been on Vesicare, but will  switch to Myrbetriq samples soon. He has occasional morning headaches. He does not wake up rested. He snores some. He has never had a sleep study. He drinks 2 cups of coffee, but not enough water. He quit smoking in 81 and does not drink alcohol.   He denies vertigo type symptoms. Sometimes he feels lightheaded when he stands quickly. He is particularly insecure in the dark. He still drives but not in the dark. He has not had any recent blood work. He fell yesterday as he was trying to sit on a little stool which did not carry his weight and broke. He hit his head backwards but does not have any residual symptoms. He did not lose consciousness.   His Past Medical History Is Significant For: Past Medical History  Diagnosis Date  . Hypokalemia   . Hyperlipidemia   . Atrial fibrillation     longterm persistent  . CAD (coronary artery disease)   . HTN (hypertension)   . Shortness of breath     PT STATES RELATED TO HIS AF AND HEART BEING OUT OF RHYTHM  . Back pain, chronic     SINCE BACK INJURY / FRACTURE  . GERD (gastroesophageal reflux disease)   . Arthritis   . Cough 06/16/12    C/O OF COUGH / COLD FOR A COUPLE OF WEEKS  . Cancer     RIGHT RENAL  . Aortic valve replaced   . Stomach ulcer   . Constipation   . BPH (benign prostatic hyperplasia)     grapey  . Diverticulitis     history  . Right renal mass     renal cell carcinoma  . Kidney disorder     ablation defect in the right kidney lower pole but progression of interpolar lesion: stable on repeat CT (06/2014)  . Elevated fasting glucose 2012    His Past Surgical History Is Significant For: Past Surgical History  Procedure Laterality Date  . Aortic valve replacement    . Coronary artery bypass graft    . Thoracic t12 compression fracture    . Radiofrequency ablation kidney      RIGHT (two surgeries)  . Cholecystectomy N/A 09/08/2013    Procedure: LAPAROSCOPIC CHOLECYSTECTOMY WITH INTRAOPERATIVE CHOLANGIOGRAM;  Surgeon:  Odis Hollingshead, MD;  Location: [facility];  Service: General;  Laterality: N/A;  . Left heart catheterization with coronary/graft angiogram N/A 09/07/2013    Procedure: LEFT HEART CATHETERIZATION WITH Beatrix Fetters;  Surgeon: Sinclair Grooms, MD;  Location[facility] C[facility];  Service: Cardiovascular;  Laterality: N/A;  . Knee surgery      His Family History Is Significant For: Family History  Problem Relation Age of Onset  . Hypertension Sister   . Diabetes Sister   . Hypertension Mother   . Diabetes Mother   . Hypertension Brother   . Diabetes Brother     His Social History Is Significant For: History   Social History  . Marital Status: Married    Spouse Name:  N/A  . Number of Children: 2  . Years of Education: N/A   Occupational History  .      retired   Social History Main Topics  . Smoking status: Former Smoker -- 2.00 packs/day for 20 years    Types: Cigarettes    Quit date: 08/27/1977  . Smokeless tobacco: Never Used  . Alcohol Use: No  . Drug Use: No  . Sexual Activity: Not on file   Other Topics Concern  . None   Social History Narrative   Lives with his wife, good family support, ambulates without assist device.   Caffeine 2 cups daily.     His Allergies Are:  No Known Allergies:   His Current Medications Are:  Outpatient Encounter Prescriptions as of 11/29/2014  Medication Sig  . acetaminophen (TYLENOL) 325 MG tablet Take 650 mg by mouth every 6 (six) hours as needed for mild pain.   [name] amoxicillin (AMOXIL) 500 MG capsule Take 500 mg by mouth as needed.  [name] aspirin EC 81 MG EC tablet Take 1 tablet (81 mg total) by mouth daily.  . carvedilol (COREG) 25 MG tablet TAKE ONE TABLET BY MOUTH TWICE DAILY. TAKE WITH A MEAL.  . cholecalciferol (VITAMIN D) 1000 UNITS tablet Take 1,000 Units by mouth daily.  . digoxin (LANOXIN) 0.125 MG tablet TAKE ONE TABLET BY MOUTH ONCE DAILY  . docusate sodium (COLACE) 50 MG capsule Take 50 mg by mouth 2 (two) times  daily.  . famotidine (PEPCID) 20 MG tablet Take 20 mg by mouth 2 (two) times daily.  [name] KLOR-CON 8 MEQ tablet TAKE ONE TABLET BY MOUTH ONCE DAILY.  . Multiple Vitamin (MULTIVITAMIN WITH MINERALS) TABS tablet Take 1 tablet by mouth daily.  . nitroGLYCERIN (NITROSTAT) 0.4 MG SL tablet Place 1 tablet (0.4 mg total) under the tongue every 5 (five) minutes as needed. For chest pain  . ramipril (ALTACE) 5 MG capsule Take 5 mg by mouth daily.  . ranitidine (ZANTAC) 150 MG tablet Take 150 mg by mouth 2 (two) times daily as needed for heartburn.   . Sennosides (SENNA LAX PO) Take 50 mg by mouth as needed.  . Tamsulosin HCl (FLOMAX) 0.4 MG CAPS Take 0.4 mg by mouth daily.   [name] torsemide (DEMADEX) 10 MG tablet Take 1 tablet (10 mg total) by mouth 2 (two) times daily.  . vitamin B-12 (CYANOCOBALAMIN) 1000 MCG tablet Take 1,000 mcg by mouth daily.    [name] warfarin (COUMADIN) 5 MG tablet Take as directed by [facility]  . [DISCONTINUED] VESICARE 10 MG tablet   :  Review of Systems:  Out of a complete 14 point review of systems, all are reviewed and negative with the exception of these symptoms as listed below:   Review of Systems  All other systems reviewed and are negative.   Objective:  Neurologic Exam  Physical Exam Physical Examination:   Filed Vitals:   11/29/14 0911  BP: 178/80  Pulse: 82  Resp: 18    General Examination: The patient is a very pleasant 81 y.o. male in no acute distress. He appears well-developed and well-nourished and well groomed. He denies any vertigo, lightheadedness upon standing, chest pain, shortness of breath, headache or blurry vision.  HEENT: Normocephalic, atraumatic, pupils are equal, round and reactive to light and accommodation. Funduscopic exam is normal with sharp disc margins noted. Extraocular tracking is good without limitation to gaze excursion or nystagmus noted. Normal smooth pursuit is noted. Hearing is grossly intact. He is  hard of hearing and has a  hearing aid in place on the right side. Face is symmetric with normal facial animation and normal facial sensation. Speech is clear with no dysarthria noted. There is no hypophonia. There is no lip, neck/head, jaw or voice tremor. Neck is supple with full range of passive and active motion. There are no carotid bruits on auscultation. Oropharynx exam reveals: moderate mouth dryness, adequate dental hygiene and moderate airway crowding, due to redundant soft palate and wider uvula. Tonsils are small. Mallampati is class II. Tongue protrudes centrally and palate elevates symmetrically.   Chest: Clear to auscultation without wheezing, rhonchi or crackles noted.  Heart: Irregular and he has a systolic murmur, unchanged.    Abdomen: Soft, non-tender and non-distended with normal bowel sounds appreciated on auscultation.  Extremities: There is 1+ pitting edema in the distal lower extremities bilaterally, left more than right. Pedal pulses are intact.  Skin: Warm and dry without trophic changes noted. There are no varicose veins.  Musculoskeletal: exam reveals no obvious joint deformities, tenderness or joint swelling or erythema.   Neurologically:  Mental status: The patient is awake, alert and oriented in all 4 spheres. His immediate and remote memory, attention, language skills and fund of knowledge are appropriate. There is no evidence of aphasia, agnosia, apraxia or anomia. Speech is clear with normal prosody and enunciation. Thought process is linear. Mood is normal and affect is normal.  Cranial nerves II - XII are as described above under HEENT exam. In addition: shoulder shrug is normal with equal shoulder height noted. Motor exam: Normal bulk, strength and tone is noted. There is no drift, tremor or rebound. Romberg is positive. Reflexes are 2+ throughout. Babinski: Toes are flexor bilaterally. Fine motor skills and coordination: intact with normal finger taps, normal hand movements, normal rapid  alternating patting, normal foot taps and normal foot agility.  Cerebellar testing: No dysmetria or intention tremor on finger to nose testing. Heel to shin is unremarkable bilaterally. There is no truncal ataxia.  Sensory exam: intact to light touch, pinprick, vibration, temperature sense in the upper and lower extremities, with the exception of mild decrease in vibration sense in the distal lower extremities, unchanged.  Gait, station and balance: He stands with difficulty. No veering to one side is noted. His upper body is slightly tilted to the right. Posture is  mildly stooped and may be age-appropriate and stance is  mildly wide-based. He is not able to securely walk without a walker. He uses a cane. He has no significant shuffling. He turns slowly. Tandem walk is not possible.  Assessment and Plan:   In summary, [name] is a very pleasant 81 year old male with an underlying medical history of aortic stenosis, coronary artery disease, status post CABG and St. Jude aortic valve replacement in October 2003, chronic atrial fibrillation, hypertension, hyperlipidemia, T12 compression fracture with status post kyphoplasty, diverticulitis, renal cell mass and treatment resistant urinary incontinence, followed by urology, who has had balance problems and recurrent falls for the past 6 to 9 months. On examination he has evidence of a gait disorder but no focal neurological findings and his exam is unchanged from before. He has been diagnosed with obstructive sleep apnea and I talked to him about the results today and also his compliance data in detail. All questions were answered. He is congratulated on his full compliance with treatment.  He most likely has a multifactorial gait disturbance secondary to aging, degenerative back disease with abnormal  posture, possible medication side effects, in particular with anticholinergic medications,  tendency towards dehydration. He  is advised to continue using  his CPAP every night. We will request blood test results from his primary care physician. I would like to request a brain MRI which I ordered last time. We will schedule this and call him or his wife with the results.  He has had  falls in the past and is strongly advised to change positions slowly, drink more water, and use his cane or 2 wheeled walker at all times for safety. I will see him back in a few months, sooner if the need arises. I did not suggest any new medications at this time. I spent 25 minutes in total face-to-face time with the patient, more than 50% of which was spent in counseling and coordination of care, reviewing test results, reviewing medication and discussing or reviewing the diagnosis of OSA and gait d/o, the prognosis and treatment options.  12/03/2014: I reviewed recent blood test results from his primary care physician's office which were faxed to me. 11/15/2014: CBC with differential was unremarkable, coag profile was unremarkable.

## 2014-11-29 NOTE — Patient Instructions (Addendum)
We will do a brain MRI and call you with the results.   I believe you have a gait disorder, which likely is due to a combination of things.   Remember to drink plenty of fluid, eat healthy meals and do not skip any meals. Try to eat protein with a every meal and eat a healthy snack such as fruit or nuts in between meals. Try to keep a regular sleep-wake schedule and try to exercise daily, particularly in the form of walking, 20-30 minutes a day, if you can. Change positions slowly and you should start using a cane.   As far as your medications are concerned, I would like to suggest no new medications.   As far as diagnostic testing: MRI brain.   Please continue using your CPAP regularly. While your insurance requires that you use CPAP at least 4 hours each night on 70% of the nights, I recommend, that you not skip any nights and use it throughout the night if you can. Getting used to CPAP and staying with the treatment long term does take time and patience and discipline. Untreated obstructive sleep apnea when it is moderate to severe can have an adverse impact on cardiovascular health and raise her risk for heart disease, arrhythmias, hypertension, congestive heart failure, stroke and diabetes. Untreated obstructive sleep apnea causes sleep disruption, nonrestorative sleep, and sleep deprivation. This can have an impact on your day to day functioning and cause daytime sleepiness and impairment of cognitive function, memory loss, mood disturbance, and problems focussing. Using CPAP regularly can improve these symptoms.

## 2014-11-30 ENCOUNTER — Telehealth: Payer: Self-pay

## 2014-11-30 NOTE — Telephone Encounter (Signed)
I called all 3 phone numbers (x2) that we have for patient, one busy tone, one with no vm, and last had vm with someone else's name. I sent patient a letter stating that he can call [facility] (provided number) to schedule his MRI.

## 2014-12-01 ENCOUNTER — Telehealth: Payer: Self-pay | Admitting: Cardiovascular Disease

## 2014-12-01 ENCOUNTER — Other Ambulatory Visit: Payer: Self-pay | Admitting: Cardiovascular Disease

## 2014-12-01 NOTE — Telephone Encounter (Signed)
TIME BLOCKED OFF ON  SCHEDULE FOR MAY  16 TH  AT  9:00 AM  FOR  SAID  PHONE  CALL ./CY

## 2014-12-01 NOTE — Telephone Encounter (Signed)
Pt's wife Mechele Claude calling for update of approval on Torsemide, pls call  [phone] or [phone]

## 2014-12-01 NOTE — Telephone Encounter (Signed)
PT'S WIFE  AWARE  HAVE   TIME  SET ASIDE   FOR   CALL WITH  JUDGE   RE  GETTING  TORSEMIDE  APPROVED    ON MAY  16 TH  AT  9:00 AM .Adonis Housekeeper

## 2014-12-01 NOTE — Telephone Encounter (Addendum)
PT'S WIFE  CALLED INS  CO  [employer] STATED THAT  DR'S OFFICE  HAD  NOT RESPONDED  TO  FORM. THIS IS   NOT  TRUE   FORM  HAS  BEEN FILLED  OUT  AND  FAXED MULTIPLE TIMES  WITH  CONFIRMATION RECEIVED  WILL CALL CUSTOMER SERVICE INFO  TOM  AND  FIND OUT  WHAT IS  GOING  ON .Adonis Housekeeper

## 2014-12-01 NOTE — Telephone Encounter (Signed)
New message      Calling to give update on denial from ins company on medication

## 2014-12-02 NOTE — Telephone Encounter (Signed)
Pt's wife Mechele Claude rtn call to [phone]

## 2014-12-02 NOTE — Telephone Encounter (Signed)
SPOKE WITH [employer]  MED  REQUEST  IS NO LONGER IN THIS  DEPARTMENT  WILL HAVE  TO  SPEAK  WITH  [employer] JUDGE  AT SAID  DATE SEE OTHER  PHONE NOTE  .Adonis Housekeeper

## 2014-12-02 NOTE — Telephone Encounter (Signed)
Follow up ° ° ° ° ° °Returning Christine's call °

## 2014-12-02 NOTE — Telephone Encounter (Signed)
LM TO CALL BACK ./CY 

## 2014-12-07 ENCOUNTER — Ambulatory Visit (INDEPENDENT_AMBULATORY_CARE_PROVIDER_SITE_OTHER): Payer: Medicare Other | Admitting: Pharmacist

## 2014-12-07 DIAGNOSIS — Z5181 Encounter for therapeutic drug level monitoring: Secondary | ICD-10-CM

## 2014-12-07 DIAGNOSIS — Z952 Presence of prosthetic heart valve: Secondary | ICD-10-CM

## 2014-12-07 DIAGNOSIS — I359 Nonrheumatic aortic valve disorder, unspecified: Secondary | ICD-10-CM | POA: Diagnosis not present

## 2014-12-07 DIAGNOSIS — I4891 Unspecified atrial fibrillation: Secondary | ICD-10-CM | POA: Diagnosis not present

## 2014-12-07 DIAGNOSIS — Z954 Presence of other heart-valve replacement: Secondary | ICD-10-CM | POA: Diagnosis not present

## 2014-12-07 DIAGNOSIS — Z7901 Long term (current) use of anticoagulants: Secondary | ICD-10-CM | POA: Diagnosis not present

## 2014-12-07 LAB — POCT INR: INR: 1.8

## 2014-12-21 ENCOUNTER — Ambulatory Visit (INDEPENDENT_AMBULATORY_CARE_PROVIDER_SITE_OTHER): Payer: Medicare Other

## 2014-12-21 DIAGNOSIS — Z5181 Encounter for therapeutic drug level monitoring: Secondary | ICD-10-CM

## 2014-12-21 DIAGNOSIS — Z954 Presence of other heart-valve replacement: Secondary | ICD-10-CM

## 2014-12-21 DIAGNOSIS — Z7901 Long term (current) use of anticoagulants: Secondary | ICD-10-CM | POA: Diagnosis not present

## 2014-12-21 DIAGNOSIS — Z952 Presence of prosthetic heart valve: Secondary | ICD-10-CM

## 2014-12-21 DIAGNOSIS — I359 Nonrheumatic aortic valve disorder, unspecified: Secondary | ICD-10-CM | POA: Diagnosis not present

## 2014-12-21 DIAGNOSIS — I4891 Unspecified atrial fibrillation: Secondary | ICD-10-CM | POA: Diagnosis not present

## 2014-12-21 LAB — POCT INR: INR: 3

## 2015-01-07 ENCOUNTER — Telehealth: Payer: Self-pay | Admitting: Cardiovascular Disease

## 2015-01-07 NOTE — Telephone Encounter (Signed)
HAD  CONCERNS  RE   RECENT  APPEAL  RECEIVED INFORMED  OFFICE IS STIL WORKING  ON GETTING   TORSEMIDE  APPROVED./CY

## 2015-01-07 NOTE — Telephone Encounter (Signed)
New message      Talk to [place].  They got another letter regarding torsemide appeal and she want to talk to you about this

## 2015-01-07 NOTE — Telephone Encounter (Signed)
LEFT  VOICE MAIL  ONCE  AGAIN  RE  NEEDING  TO  HAVE  PHONE  CALL ON MAY  16 TH  AT  9 :00 AM MD  WAS NOT EVEN IN OFFICE  ON M AY  9TH AT  1:00 PM  WAS NEVER  SCHEDULED .Adonis Housekeeper

## 2015-01-11 ENCOUNTER — Ambulatory Visit (INDEPENDENT_AMBULATORY_CARE_PROVIDER_SITE_OTHER): Payer: Medicare Other | Admitting: *Deleted

## 2015-01-11 DIAGNOSIS — I359 Nonrheumatic aortic valve disorder, unspecified: Secondary | ICD-10-CM | POA: Diagnosis not present

## 2015-01-11 DIAGNOSIS — Z954 Presence of other heart-valve replacement: Secondary | ICD-10-CM

## 2015-01-11 DIAGNOSIS — I4891 Unspecified atrial fibrillation: Secondary | ICD-10-CM

## 2015-01-11 DIAGNOSIS — Z7901 Long term (current) use of anticoagulants: Secondary | ICD-10-CM

## 2015-01-11 DIAGNOSIS — Z952 Presence of prosthetic heart valve: Secondary | ICD-10-CM

## 2015-01-11 DIAGNOSIS — Z5181 Encounter for therapeutic drug level monitoring: Secondary | ICD-10-CM

## 2015-01-11 LAB — POCT INR: INR: 2.3

## 2015-01-12 ENCOUNTER — Encounter: Payer: Self-pay | Admitting: *Deleted

## 2015-01-12 NOTE — Telephone Encounter (Signed)
WAS ABLE  TO SPEAK  WITH  MEDICARE  AND  HEARING  STAFF  WAS  UNABLE  TO   SPEAK  WITH JUDGE  ON  STATED  DATE  NO  PHONE  CALL WAS  RECEIVED  PER   HEARINGS  DEPARTMENT   UNABLE   TO PROCEED  UNTIL   LETTER  DONE  FOR   FAILURE    TO APPEAR  JUDGE NEEDS  TO RECEIVE AND  REVIEW  IF  WILL  ADDRESS  ALSO  PT  NEEDS  TO   SIGN   FORM OR LETTER  STATING THAT  DR Johnsie Cancel  MAY REPRESENT  ON PT'S  BEHALF .ALSO  OFFICE  STATED PT    MAY   PROCEED  WITH  APPEAL  ON THEIR OWN  WILL CALL PT  /CY

## 2015-01-12 NOTE — Telephone Encounter (Signed)
LM FOR  WIFE  TO CALL BACK .Adonis Housekeeper

## 2015-01-12 NOTE — Telephone Encounter (Signed)
SPOKE WITH WIFE  WILL  HAVE PT  DO  A LETTER  AS  WELL AS HAVE IT  NOTARIZED  WILL BRING TO OFFICE ON MON.01-17-15. ALSO  MEDICARE  HEARINGS  APPEAL

## 2015-01-27 ENCOUNTER — Telehealth: Payer: Self-pay

## 2015-01-27 NOTE — Telephone Encounter (Signed)
Duplicate note opened

## 2015-01-27 NOTE — Telephone Encounter (Signed)
Prior auth request sent to [facility] for Torsemide 10mg  via Cover My Meds.

## 2015-02-03 ENCOUNTER — Telehealth: Payer: Self-pay | Admitting: Cardiovascular Disease

## 2015-02-03 NOTE — Telephone Encounter (Signed)
New Message      [employer] calling to speak to [facility] and they need more clinical Info about pt. Please call back and advise.

## 2015-02-04 ENCOUNTER — Telehealth: Payer: Self-pay | Admitting: Cardiovascular Disease

## 2015-02-04 NOTE — Telephone Encounter (Signed)
Pt's wife calling re problem with meds-got more papers from [facility] today-know not in today -will call on Monday

## 2015-02-04 NOTE — Telephone Encounter (Signed)
Spoke with patient and his wife regarding appeal of Torsemide A representative   From [employer] is  reviewing this and will give [place] a determination next week.

## 2015-02-07 ENCOUNTER — Telehealth: Payer: Self-pay

## 2015-02-07 NOTE — Telephone Encounter (Signed)
Authorization  For Torsemide 10mg  tabs has been approved by [employer]. auth # [id number] for coverage from 01/27/15 to 01/27/2016. Patient's wife aware.

## 2015-02-08 ENCOUNTER — Ambulatory Visit (INDEPENDENT_AMBULATORY_CARE_PROVIDER_SITE_OTHER): Payer: Medicare Other

## 2015-02-08 DIAGNOSIS — I4891 Unspecified atrial fibrillation: Secondary | ICD-10-CM

## 2015-02-08 DIAGNOSIS — Z954 Presence of other heart-valve replacement: Secondary | ICD-10-CM | POA: Diagnosis not present

## 2015-02-08 DIAGNOSIS — Z5181 Encounter for therapeutic drug level monitoring: Secondary | ICD-10-CM

## 2015-02-08 DIAGNOSIS — Z952 Presence of prosthetic heart valve: Secondary | ICD-10-CM

## 2015-02-08 DIAGNOSIS — Z7901 Long term (current) use of anticoagulants: Secondary | ICD-10-CM | POA: Diagnosis not present

## 2015-02-08 DIAGNOSIS — I359 Nonrheumatic aortic valve disorder, unspecified: Secondary | ICD-10-CM

## 2015-02-08 LAB — POCT INR: INR: 2.3

## 2015-02-09 NOTE — Telephone Encounter (Signed)
LM TO CALL BACK ./CY 

## 2015-02-16 NOTE — Telephone Encounter (Signed)
Spoke with [name] and she states that she was calling about Torsemide at that time but that she has since been contacted that it has been approved for coverage for a year. Wife very appreciative for everyone in the office for working to get this approved. Informed wife that we are glad to help where we can.

## 2015-03-02 ENCOUNTER — Other Ambulatory Visit: Payer: Self-pay | Admitting: Cardiovascular Disease

## 2015-03-02 IMAGING — CT CT ABD-PELV W/ CM
2 of 5 series · 15 of 46 positions shown, 17 images · IV contrast (OMNIPAQUE)
Comparison: CT scan of the abdomen dated June 16, 2013.

CLINICAL DATA: Mid abdominal discomfort and generalized weakness,
history of renal cell malignancy and radiofrequency ablation.

EXAM:
CT ABDOMEN AND PELVIS WITH CONTRAST
TECHNIQUE: Multidetector CT imaging of the abdomen and pelvis was performed
using the standard protocol following bolus administration of
intravenous contrast.
CONTRAST:  100mL OMNIPAQUE IOHEXOL 300 MG/ML  SOLN

[Series 2: rtn a/p with · axial · 0.92mm/px · z∈[-606,-131]mm · 12 of 107 slices shown, 14 images]
[im 6/107  soft-tissue]
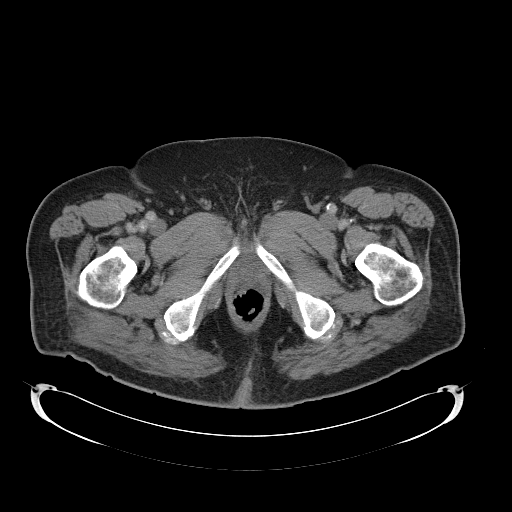
[im 6/107  bone]
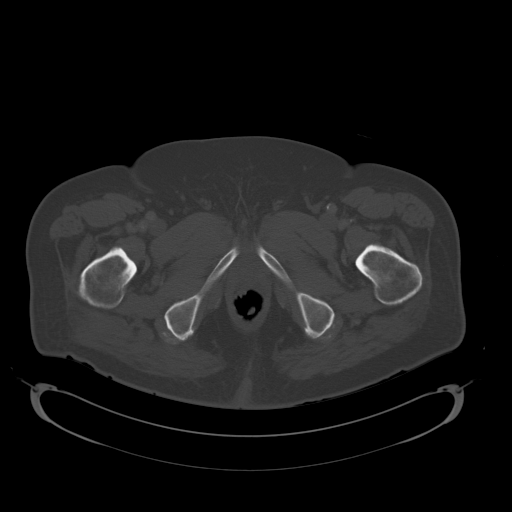
[im 17/107  soft-tissue]
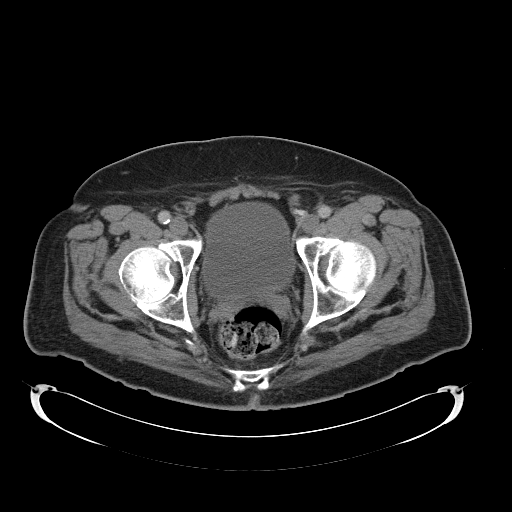
[im 23/107  soft-tissue]
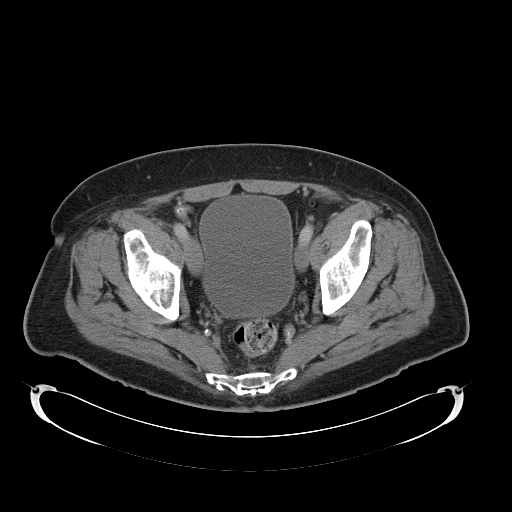
[im 34/107  soft-tissue]
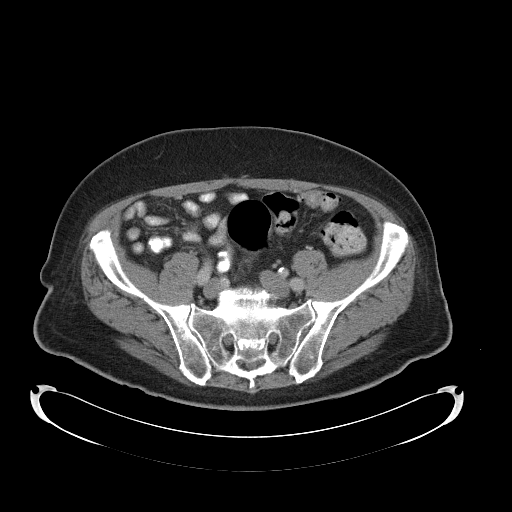
[im 40/107  soft-tissue]
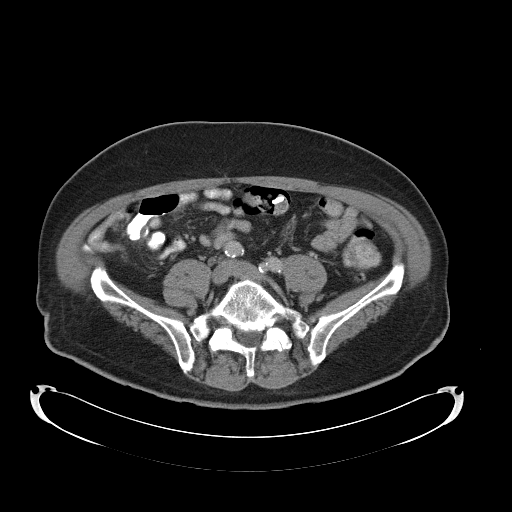
[im 51/107  soft-tissue]
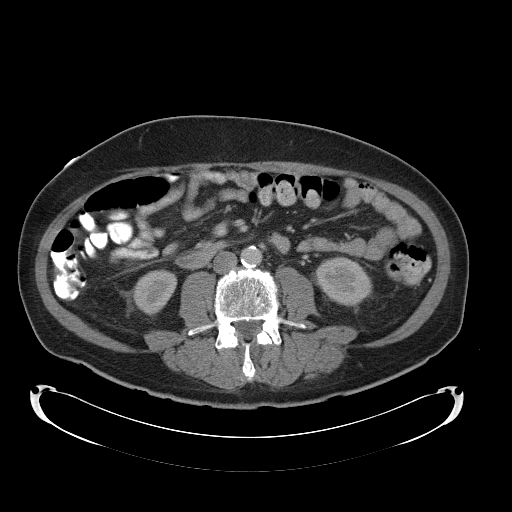
[im 56/107  soft-tissue]
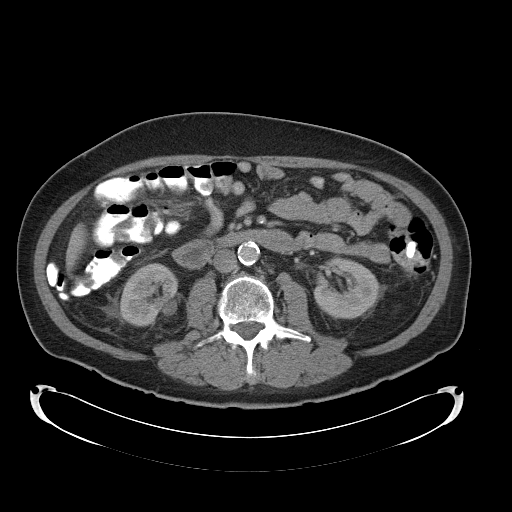
[im 67/107  soft-tissue]
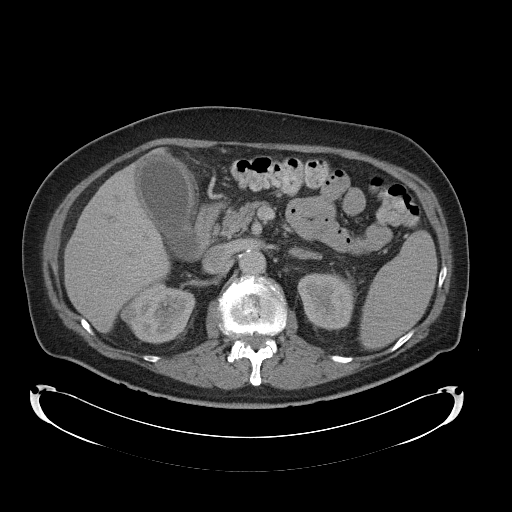
[im 73/107  soft-tissue]
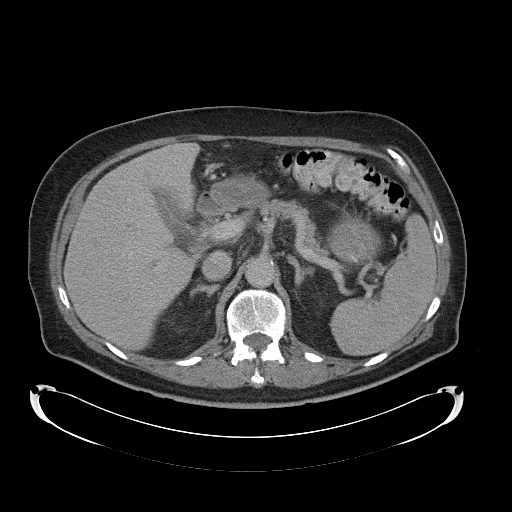
[im 73/107  bone]
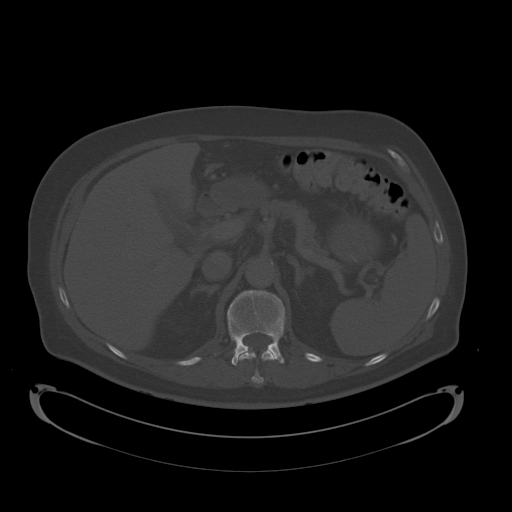
[im 84/107  soft-tissue]
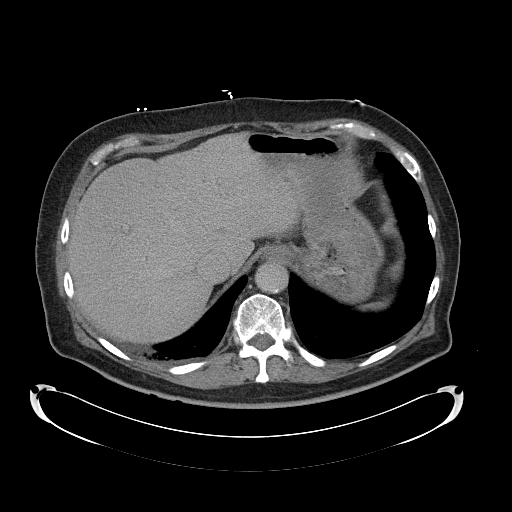
[im 90/107  soft-tissue]
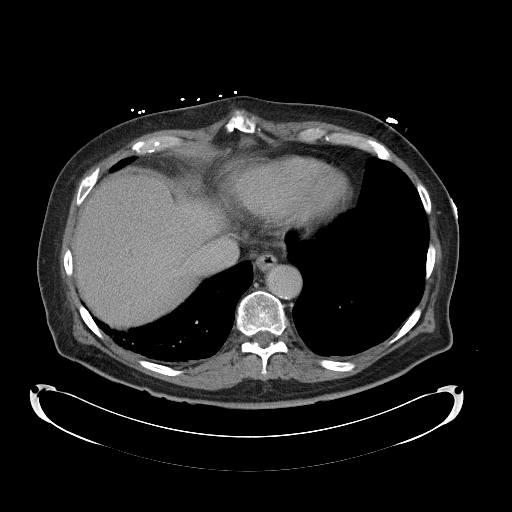
[im 101/107  soft-tissue]
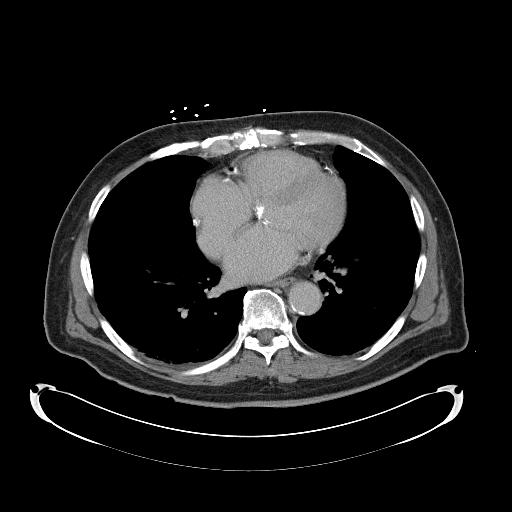

[Series 602: <mpr thick range> · coronal · 1.04mm/px · 3 of 83 slices shown]
[im 28/83  soft-tissue]
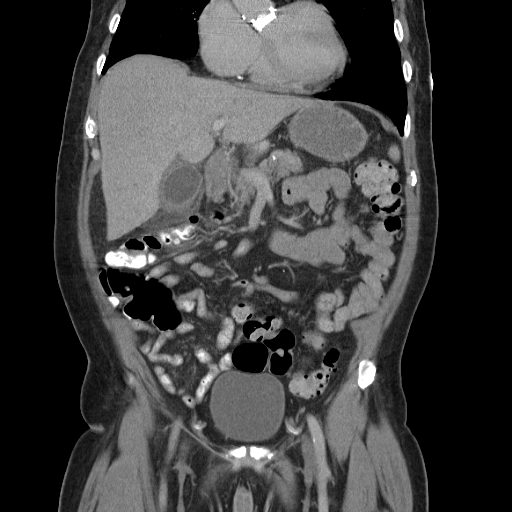
[im 37/83  soft-tissue]
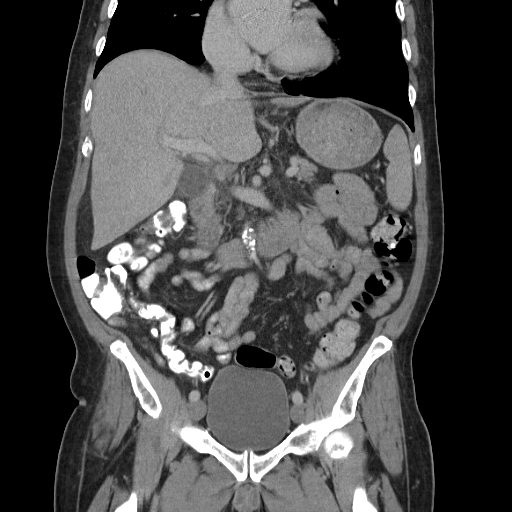
[im 46/83  soft-tissue]
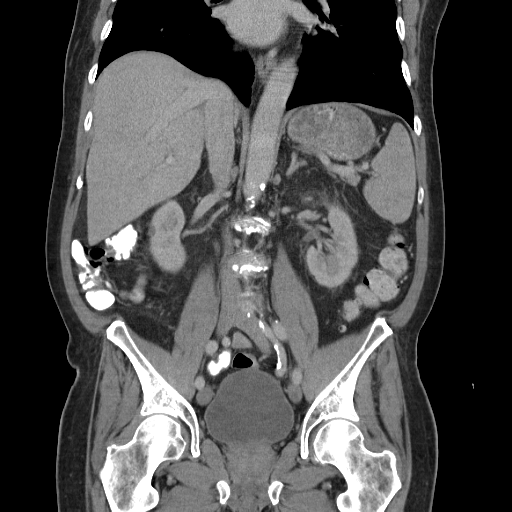

[15 of 46 positions shown; findings below may reference images not displayed]

FINDINGS: The gallbladder is abnormal in appearance. It is mildly distended
and exhibits wall thickening and pericholecystic fluid. No calcified
or noncalcified stones are demonstrated. The adjacent liver is
normal in density and contour. There may be a trace of intrahepatic
ductal dilation. The pancreas is somewhat atrophic and exhibits no
evidence of inflammatory change or focal mass or ductal dilation.
The stomach is moderately distended with food particles. The spleen
is not enlarged. There are no adrenal masses

The kidneys exhibit no evidence of obstruction. There are findings
consistent with the previous radiofrequency ablation on the right.
There is a small hypodense focus medial to the lower pole of the
right kidney which is stable. It is demonstrated on image 52 of
series 2. It measures 1.6 by a approximately 1 cm. A 2nd cortically
based midpole hypodensity on the right is stable and is most
compatible with a cyst. There is a stable 1.5 cm diameter
hypodensity in the lateral aspect of the mid to upper pole of the
right kidney demonstrated best on image 41 of series 2. In the
medial aspect of the mid to upper pole on image 42 of series 2 there
is a 1 cm diameter stable appearing hypodensity. On the left there
is no evidence of obstruction or parenchymal masses. A subcentimeter
hypodensity in the midpole laterally is consistent with a cyst.

On delayed images contrast within the renal collecting systems is
within the limits of normal.

The caliber of the abdominal aorta is normal. The periaortic and
pericaval regions also appear normal. The partially contrast filled
loops of small and large bowel exhibit no evidence of ileus nor of
obstruction. A normal calibered, partially contrast-filled,
uninflamed-appearing appendix is demonstrated. Within the pelvis the
urinary bladder is mildly distended. The prostate gland is mildly
enlarged and produces an impression upon the urinary bladder base.
The seminal vesicles and prostate gland appear normal. There is a
small fat containing left inguinal hernia. There is a tiny fat
containing umbilical hernia.

The right lung base demonstrates very mild subsegmental atelectasis
posteriorly. The lumbar vertebral bodies are preserved in height.
There is degenerative disc change at multiple lumbar levels. There
is partial compression of the body of T12 which appears to have been
previously treated with kyphoplasty.
IMPRESSION: 1. The appearance of the gallbladder is abnormal suggesting
acalculous cholecystitis. Ultrasound of the gallbladder would be
useful.
2. The appearance of both kidneys is stable with no evidence of
obstruction. Post ablation changes on the right are present and
stable. There are stable hypodensities remaining in the renal cortex
of the right kidney.
3. No acute bowel abnormality is demonstrated.
4. There is mild distention of the urinary bladder which may be
related to the mildly enlarged prostate gland. Foley catheterization
would be useful.

## 2015-03-04 ENCOUNTER — Telehealth: Payer: Self-pay | Admitting: *Deleted

## 2015-03-04 NOTE — Telephone Encounter (Signed)
Wife called and stated that the patient's warfarin refill has not been filled.  According to Epic the refill was sent on 03/02/15 at 312pm, advised the wife that I would check with the pharmacy.  Spoke with Jenny Reichmann at the [facility] and she stated that the wafarin refill was there and has been sine 03/02/15.  Returned a call to the patient and spoke with wife and advised that the medication was there and ready for pickup. She was appreciative and thanked me for looking into this.

## 2015-03-22 ENCOUNTER — Ambulatory Visit (INDEPENDENT_AMBULATORY_CARE_PROVIDER_SITE_OTHER): Payer: Medicare Other | Admitting: *Deleted

## 2015-03-22 DIAGNOSIS — I4891 Unspecified atrial fibrillation: Secondary | ICD-10-CM

## 2015-03-22 DIAGNOSIS — Z5181 Encounter for therapeutic drug level monitoring: Secondary | ICD-10-CM

## 2015-03-22 DIAGNOSIS — I359 Nonrheumatic aortic valve disorder, unspecified: Secondary | ICD-10-CM | POA: Diagnosis not present

## 2015-03-22 DIAGNOSIS — Z7901 Long term (current) use of anticoagulants: Secondary | ICD-10-CM

## 2015-03-22 DIAGNOSIS — Z954 Presence of other heart-valve replacement: Secondary | ICD-10-CM | POA: Diagnosis not present

## 2015-03-22 DIAGNOSIS — Z952 Presence of prosthetic heart valve: Secondary | ICD-10-CM

## 2015-03-22 LAB — POCT INR: INR: 2.1

## 2015-03-23 ENCOUNTER — Other Ambulatory Visit: Payer: Self-pay | Admitting: Cardiovascular Disease

## 2015-03-24 ENCOUNTER — Other Ambulatory Visit: Payer: Self-pay

## 2015-03-24 MED ORDER — TORSEMIDE 10 MG PO TABS: 10.0000 mg | ORAL_TABLET | Freq: Two times a day (BID) | ORAL | Status: DC

## 2015-04-28 ENCOUNTER — Other Ambulatory Visit: Payer: Self-pay | Admitting: Cardiovascular Disease

## 2015-05-03 ENCOUNTER — Ambulatory Visit (INDEPENDENT_AMBULATORY_CARE_PROVIDER_SITE_OTHER): Payer: Medicare Other | Admitting: *Deleted

## 2015-05-03 DIAGNOSIS — I359 Nonrheumatic aortic valve disorder, unspecified: Secondary | ICD-10-CM | POA: Diagnosis not present

## 2015-05-03 DIAGNOSIS — Z5181 Encounter for therapeutic drug level monitoring: Secondary | ICD-10-CM

## 2015-05-03 DIAGNOSIS — Z7901 Long term (current) use of anticoagulants: Secondary | ICD-10-CM

## 2015-05-03 DIAGNOSIS — Z954 Presence of other heart-valve replacement: Secondary | ICD-10-CM

## 2015-05-03 DIAGNOSIS — I4891 Unspecified atrial fibrillation: Secondary | ICD-10-CM | POA: Diagnosis not present

## 2015-05-03 DIAGNOSIS — Z952 Presence of prosthetic heart valve: Secondary | ICD-10-CM

## 2015-05-03 LAB — POCT INR: INR: 4.2

## 2015-05-04 ENCOUNTER — Other Ambulatory Visit: Payer: Self-pay | Admitting: Cardiovascular Disease

## 2015-05-05 ENCOUNTER — Other Ambulatory Visit: Payer: Self-pay

## 2015-05-05 MED ORDER — CARVEDILOL 25 MG PO TABS: 25.0000 mg | ORAL_TABLET | Freq: Two times a day (BID) | ORAL | Status: DC

## 2015-05-06 ENCOUNTER — Other Ambulatory Visit: Payer: Self-pay | Admitting: Cardiovascular Disease

## 2015-05-17 ENCOUNTER — Ambulatory Visit (INDEPENDENT_AMBULATORY_CARE_PROVIDER_SITE_OTHER): Payer: Medicare Other | Admitting: *Deleted

## 2015-05-17 DIAGNOSIS — Z954 Presence of other heart-valve replacement: Secondary | ICD-10-CM

## 2015-05-17 DIAGNOSIS — I359 Nonrheumatic aortic valve disorder, unspecified: Secondary | ICD-10-CM | POA: Diagnosis not present

## 2015-05-17 DIAGNOSIS — Z7901 Long term (current) use of anticoagulants: Secondary | ICD-10-CM | POA: Diagnosis not present

## 2015-05-17 DIAGNOSIS — I4891 Unspecified atrial fibrillation: Secondary | ICD-10-CM | POA: Diagnosis not present

## 2015-05-17 DIAGNOSIS — Z952 Presence of prosthetic heart valve: Secondary | ICD-10-CM

## 2015-05-17 DIAGNOSIS — Z5181 Encounter for therapeutic drug level monitoring: Secondary | ICD-10-CM

## 2015-05-17 LAB — POCT INR: INR: 2.7

## 2015-05-31 ENCOUNTER — Ambulatory Visit: Payer: Medicare Other | Admitting: Neurology

## 2015-06-09 ENCOUNTER — Other Ambulatory Visit: Payer: Self-pay | Admitting: Radiology

## 2015-06-09 ENCOUNTER — Other Ambulatory Visit (HOSPITAL_COMMUNITY): Payer: Self-pay | Admitting: Interventional Radiology

## 2015-06-09 DIAGNOSIS — N2889 Other specified disorders of kidney and ureter: Secondary | ICD-10-CM

## 2015-06-09 DIAGNOSIS — C641 Malignant neoplasm of right kidney, except renal pelvis: Secondary | ICD-10-CM

## 2015-06-13 ENCOUNTER — Ambulatory Visit (INDEPENDENT_AMBULATORY_CARE_PROVIDER_SITE_OTHER): Payer: Medicare Other | Admitting: *Deleted

## 2015-06-13 DIAGNOSIS — I4891 Unspecified atrial fibrillation: Secondary | ICD-10-CM

## 2015-06-13 DIAGNOSIS — Z952 Presence of prosthetic heart valve: Secondary | ICD-10-CM

## 2015-06-13 DIAGNOSIS — Z7901 Long term (current) use of anticoagulants: Secondary | ICD-10-CM | POA: Diagnosis not present

## 2015-06-13 DIAGNOSIS — I359 Nonrheumatic aortic valve disorder, unspecified: Secondary | ICD-10-CM | POA: Diagnosis not present

## 2015-06-13 DIAGNOSIS — Z5181 Encounter for therapeutic drug level monitoring: Secondary | ICD-10-CM

## 2015-06-13 DIAGNOSIS — Z954 Presence of other heart-valve replacement: Secondary | ICD-10-CM | POA: Diagnosis not present

## 2015-06-13 LAB — POCT INR: INR: 3

## 2015-06-14 ENCOUNTER — Encounter: Payer: Self-pay | Admitting: Radiology

## 2015-06-20 ENCOUNTER — Other Ambulatory Visit (HOSPITAL_COMMUNITY): Payer: Self-pay | Admitting: Interventional Radiology

## 2015-06-20 LAB — POC BUN/CREATININE
BUN, ISTAT: 16 mg/dL (ref 8–26)
CREATININE, ISTAT: 1.1 mg/dL (ref 0.6–1.3)

## 2015-06-23 ENCOUNTER — Other Ambulatory Visit (HOSPITAL_COMMUNITY): Payer: Self-pay | Admitting: Interventional Radiology

## 2015-06-30 ENCOUNTER — Ambulatory Visit (HOSPITAL_COMMUNITY)
Admission: RE | Admit: 2015-06-30 | Discharge: 2015-06-30 | Disposition: A | Discharge status: Home / Self Care | Payer: Medicare Other | Source: Ambulatory Visit | Attending: Interventional Radiology | Admitting: Interventional Radiology

## 2015-06-30 ENCOUNTER — Encounter (HOSPITAL_COMMUNITY): Payer: Self-pay

## 2015-06-30 ENCOUNTER — Ambulatory Visit
Admission: RE | Admit: 2015-06-30 | Discharge: 2015-06-30 | Disposition: A | Discharge status: Home / Self Care | Payer: Medicare Other | Source: Ambulatory Visit | Attending: Interventional Radiology | Admitting: Interventional Radiology

## 2015-06-30 DIAGNOSIS — N281 Cyst of kidney, acquired: Secondary | ICD-10-CM | POA: Insufficient documentation

## 2015-06-30 DIAGNOSIS — Z09 Encounter for follow-up examination after completed treatment for conditions other than malignant neoplasm: Secondary | ICD-10-CM | POA: Diagnosis not present

## 2015-06-30 DIAGNOSIS — N2889 Other specified disorders of kidney and ureter: Secondary | ICD-10-CM | POA: Diagnosis present

## 2015-06-30 MED ORDER — IOHEXOL 300 MG/ML  SOLN
100.0000 mL | Freq: Once | INTRAMUSCULAR | Status: AC | PRN
  Administered 2015-06-30: 100 mL via INTRAVENOUS

## 2015-07-01 ENCOUNTER — Telehealth: Payer: Self-pay | Admitting: Cardiovascular Disease

## 2015-07-01 NOTE — Telephone Encounter (Signed)
New Message    Pt calling stating he has a missed call from today and a vm stating to call our office back. Pt states no one left their name on the message and he is trying to return the call.

## 2015-07-01 NOTE — Telephone Encounter (Signed)
PT  AWARE OF  CT RESULTS ./CY 

## 2015-07-09 DIAGNOSIS — C649 Malignant neoplasm of unspecified kidney, except renal pelvis: Secondary | ICD-10-CM | POA: Insufficient documentation

## 2015-07-09 DIAGNOSIS — N2889 Other specified disorders of kidney and ureter: Secondary | ICD-10-CM | POA: Insufficient documentation

## 2015-07-09 NOTE — Progress Notes (Signed)
Chief Complaint: Follow-up status post prior ablation of 2 separate right renal carcinomas. History of Present Illness: [name] is a 81 y.o. male status post percutaneous rate of frequency ablation of a right lower pole renal carcinoma on 04/04/2009 and cryoablation of an interpolar right renal cell carcinoma on 06/20/2012. He has been doing well. He has no complaints. He has continued to keep off weight with dietary modifications.  Past Medical History  Diagnosis Date  . Hypokalemia   . Hyperlipidemia   . Atrial fibrillation ([place])     longterm persistent  . CAD (coronary artery disease)   . HTN (hypertension)   . Shortness of breath     PT STATES RELATED TO HIS AF AND HEART BEING OUT OF RHYTHM  . Back pain, chronic     SINCE BACK INJURY / FRACTURE  . GERD (gastroesophageal reflux disease)   . Arthritis   . Cough 06/16/12    C/O OF COUGH / COLD FOR A COUPLE OF WEEKS  . Cancer ([facility])     RIGHT RENAL  . Aortic valve replaced   . Stomach ulcer   . Constipation   . BPH (benign prostatic hyperplasia)     grapey  . Diverticulitis     history  . Right renal mass     renal cell carcinoma  . Kidney disorder     ablation defect in the right kidney lower pole but progression of interpolar lesion: stable on repeat CT (06/2014)  . Elevated fasting glucose 2012    Past Surgical History  Procedure Laterality Date  . Aortic valve replacement    . Coronary artery bypass graft    . Thoracic t12 compression fracture    . Radiofrequency ablation kidney      RIGHT (two surgeries)  . Cholecystectomy N/A 09/08/2013    Procedure: LAPAROSCOPIC CHOLECYSTECTOMY WITH INTRAOPERATIVE CHOLANGIOGRAM;  Surgeon: Odis Hollingshead, MD;  Location: [facility];  Service: General;  Laterality: N/A;  . Left heart catheterization with coronary/graft angiogram N/A 09/07/2013    Procedure: LEFT HEART CATHETERIZATION WITH Beatrix Fetters;  Surgeon: Sinclair Grooms, MD;  Location: [facility] CATH LAB;   Service: Cardiovascular;  Laterality: N/A;  . Knee surgery      Allergies: Review of patient's allergies indicates no known allergies.  Medications: Prior to Admission medications   Medication Sig Start Date End Date Taking? Authorizing Provider  acetaminophen (TYLENOL) 325 MG tablet Take 650 mg by mouth every 6 (six) hours as needed for mild pain.     Historical Provider, MD  amoxicillin (AMOXIL) 500 MG capsule Take 500 mg by mouth as needed.    Historical Provider, MD  aspirin EC 81 MG EC tablet Take 1 tablet (81 mg total) by mouth daily. 09/11/13   Janece Canterbury, MD  carvedilol (COREG) 25 MG tablet Take 1 tablet (25 mg total) by mouth 2 (two) times daily with a meal. 05/05/15   Josue Hector, MD  cholecalciferol (VITAMIN D) 1000 UNITS tablet Take 1,000 Units by mouth daily.    Historical Provider, MD  digoxin (LANOXIN) 0.125 MG tablet TAKE ONE TABLET BY MOUTH ONCE DAILY 09/30/14   Josue Hector, MD  docusate sodium (COLACE) 50 MG capsule Take 50 mg by mouth 2 (two) times daily.    Historical Provider, MD  famotidine (PEPCID) 20 MG tablet Take 20 mg by mouth 2 (two) times daily.    Historical Provider, MD  KLOR-CON 8 MEQ tablet TAKE ONE TABLET BY MOUTH  ONCE DAILY 05/06/15   Josue Hector, MD  Multiple Vitamin (MULTIVITAMIN WITH MINERALS) TABS tablet Take 1 tablet by mouth daily.    Historical Provider, MD  nitroGLYCERIN (NITROSTAT) 0.4 MG SL tablet Place 1 tablet (0.4 mg total) under the tongue every 5 (five) minutes as needed. For chest pain 10/13/14   Josue Hector, MD  oxybutynin (DITROPAN-XL) 10 MG 24 hr tablet Take 10 mg by mouth at bedtime.    Historical Provider, MD  ramipril (ALTACE) 5 MG capsule Take 5 mg by mouth daily.    Historical Provider, MD  ranitidine (ZANTAC) 150 MG tablet Take 150 mg by mouth 2 (two) times daily as needed for heartburn.     Historical Provider, MD  Sennosides (SENNA LAX PO) Take 50 mg by mouth as needed.    Historical Provider, MD  Tamsulosin HCl (FLOMAX)  0.4 MG CAPS Take 0.4 mg by mouth daily.     Historical Provider, MD  torsemide (DEMADEX) 10 MG tablet Take 1 tablet (10 mg total) by mouth 2 (two) times daily. 03/24/15   Josue Hector, MD  vitamin B-12 (CYANOCOBALAMIN) 1000 MCG tablet Take 1,000 mcg by mouth daily.      Historical Provider, MD  warfarin (COUMADIN) 5 MG tablet Take as directed by [facility] 10/29/14   Josue Hector, MD  warfarin (COUMADIN) 5 MG tablet TAKE AS DIRECTED BY COUMADIN CLINIC. 03/02/15   Josue Hector, MD     Family History  Problem Relation Age of Onset  . Hypertension Sister   . Diabetes Sister   . Hypertension Mother   . Diabetes Mother   . Hypertension Brother   . Diabetes Brother     Social History   Social History  . Marital Status: Married    Spouse Name: N/A  . Number of Children: 2  . Years of Education: N/A   Occupational History  .      retired   Social History Main Topics  . Smoking status: Former Smoker -- 2.00 packs/day for 20 years    Types: Cigarettes    Quit date: 08/27/1977  . Smokeless tobacco: Never Used  . Alcohol Use: No  . Drug Use: No  . Sexual Activity: Not on file   Other Topics Concern  . Not on file   Social History Narrative   Lives with his wife, good family support, ambulates without assist device.   Caffeine 2 cups daily.      Review of Systems: A 12 point ROS discussed and pertinent positives are indicated in the HPI above.  All other systems are negative.  Review of Systems  Constitutional: Negative.   Respiratory: Negative.   Cardiovascular: Negative.   Gastrointestinal: Negative.   Genitourinary: Negative.   Musculoskeletal: Negative.   Neurological: Negative.     Vital Signs: BP 151/79 mmHg  Pulse 86  Temp(Src) 98 F (36.7 C)  Resp 18  SpO2 99%  Physical Exam  Constitutional: He is oriented to person, place, and time. He appears well-developed and well-nourished. No distress.  Abdominal: Soft. He exhibits no distension and no mass.  There is no tenderness. There is no rebound and no guarding.  Neurological: He is alert and oriented to person, place, and time.  Skin: He is not diaphoretic.  Nursing note and vitals reviewed.   Imaging: Ct Abd Wo & W Cm  06/30/2015  CLINICAL DATA:  Followup RIGHT renal ablation for renal cell carcinoma diagnosed 2013. EXAM: CT ABDOMEN WITHOUT AND  WITH CONTRAST TECHNIQUE: Multidetector CT imaging of the abdomen was performed following the standard protocol before and following the bolus administration of intravenous contrast. CONTRAST:  175mL OMNIPAQUE IOHEXOL 300 MG/ML  SOLN COMPARISON:  CT 06/29/2014, 09/01/2013 FINDINGS: Lower chest:  Lung bases are clear. Hepatobiliary: No enhancing hepatic lesion. No biliary duct dilatation post cholecystectomy. Pancreas: Normal pancreatic parenchymal intensity. No ductal dilatation or inflammation. Spleen: Normal spleen. Adrenals/urinary tract: Adrenal glands normal. Cryoablation site in the medial aspect of the RIGHT kidney is again demonstrated. The central nodular portion measuring 18 by 12 mm (image 62, series 2) compares to 16 by 13 mm on 06/29/2014 for no interval change. There is no evidence of enhancement of this nodule. There is posttherapy fatty change peripheral to the lesion also unchanged. Again demonstrated a high-density cyst in the upper pole of the RIGHT kidney measures 20 mm on image number 8, series 101 unchanged in size. Lesion demonstrates no post-contrast enhancement. Smaller cortical above simple fluid attenuation nonenhancing cysts also noted on the RIGHT (image 57, series 4). No enhancing lesion the LEFT kidney. Delayed pyelogram phase imaging demonstrates no filling defects within collecting systems or proximal ureters. Stomach/Bowel: Stomach and limited of the small bowel is unremarkable Vascular/Lymphatic: Atherosclerotic calcifications aorta. No retroperitoneal adenopathy Musculoskeletal: No aggressive osseous lesion. Vertebroplasty in the  thoracic spine. IMPRESSION: 1. Stable ablation site in the medial aspect of the RIGHT kidney with no evidence of local recurrence. 2. Bosniak 1 and Bosniak 2 renal cysts of the RIGHT kidney. Electronically Signed   By: Suzy Bouchard M.D.   On: 06/30/2015 10:46    Labs:  CBC: No results for input(s): WBC, HGB, HCT, PLT in the last 8760 hours.  COAGS:  Recent Labs  03/22/15 1007 05/03/15 1013 05/17/15 0940 06/13/15 0902  INR 2.1 4.2 2.7 3.0    BMP: No results for input(s): NA, K, CL, CO2, GLUCOSE, BUN, CALCIUM, CREATININE, GFRNONAA, GFRAA in the last 8760 hours.  Invalid input(s): CMP  LIVER FUNCTION TESTS: No results for input(s): BILITOT, AST, ALT, ALKPHOS, PROT, ALBUMIN in the last 8760 hours.  TUMOR MARKERS: No results for input(s): AFPTM, CEA, CA199, CHROMGRNA in the last 8760 hours.  Assessment and Plan:  No evidence of recurrent right renal carcinoma status post previous ablation of 2 separate carcinomas of the right kidney. He is now 3 years out from the second ablation procedure. I recommended follow-up CT in one year. Mr. Reinwald is agreeable.  SignedAletta Edouard T 07/09/2015, 1:44 PM   I spent a total of 15 Minutes in face to face in clinical consultation, greater than 50% of which was counseling/coordinating care post right renal carcinoma ablations.

## 2015-07-11 ENCOUNTER — Ambulatory Visit (INDEPENDENT_AMBULATORY_CARE_PROVIDER_SITE_OTHER): Payer: Medicare Other | Admitting: *Deleted

## 2015-07-11 DIAGNOSIS — Z954 Presence of other heart-valve replacement: Secondary | ICD-10-CM | POA: Diagnosis not present

## 2015-07-11 DIAGNOSIS — Z5181 Encounter for therapeutic drug level monitoring: Secondary | ICD-10-CM

## 2015-07-11 DIAGNOSIS — I359 Nonrheumatic aortic valve disorder, unspecified: Secondary | ICD-10-CM

## 2015-07-11 DIAGNOSIS — I4891 Unspecified atrial fibrillation: Secondary | ICD-10-CM | POA: Diagnosis not present

## 2015-07-11 DIAGNOSIS — Z952 Presence of prosthetic heart valve: Secondary | ICD-10-CM

## 2015-07-11 DIAGNOSIS — Z7901 Long term (current) use of anticoagulants: Secondary | ICD-10-CM | POA: Diagnosis not present

## 2015-07-11 LAB — POCT INR: INR: 2.6

## 2015-07-27 ENCOUNTER — Encounter: Payer: Self-pay | Admitting: Neurology

## 2015-07-27 ENCOUNTER — Ambulatory Visit (INDEPENDENT_AMBULATORY_CARE_PROVIDER_SITE_OTHER): Payer: Medicare Other | Admitting: Neurology

## 2015-07-27 VITALS — BP 152/68 | HR 72 | Resp 18 | Ht 76.0 in | Wt 220.0 lb

## 2015-07-27 DIAGNOSIS — R269 Unspecified abnormalities of gait and mobility: Secondary | ICD-10-CM

## 2015-07-27 DIAGNOSIS — I482 Chronic atrial fibrillation, unspecified: Secondary | ICD-10-CM

## 2015-07-27 DIAGNOSIS — R296 Repeated falls: Secondary | ICD-10-CM | POA: Diagnosis not present

## 2015-07-27 DIAGNOSIS — G4733 Obstructive sleep apnea (adult) (pediatric): Secondary | ICD-10-CM

## 2015-07-27 DIAGNOSIS — R351 Nocturia: Secondary | ICD-10-CM | POA: Diagnosis not present

## 2015-07-27 DIAGNOSIS — Z9989 Dependence on other enabling machines and devices: Secondary | ICD-10-CM

## 2015-07-27 NOTE — Patient Instructions (Signed)
Please use your cane for gait safety.   Please continue using your CPAP regularly. While your insurance requires that you use CPAP at least 4 hours each night on 70% of the nights, I recommend, that you not skip any nights and use it throughout the night if you can. Getting used to CPAP and staying with the treatment long term does take time and patience and discipline. Untreated obstructive sleep apnea when it is moderate to severe can have an adverse impact on cardiovascular health and raise her risk for heart disease, arrhythmias, hypertension, congestive heart failure, stroke and diabetes. Untreated obstructive sleep apnea causes sleep disruption, nonrestorative sleep, and sleep deprivation. This can have an impact on your day to day functioning and cause daytime sleepiness and impairment of cognitive function, memory loss, mood disturbance, and problems focussing. Using CPAP regularly can improve these symptoms.  We will do a brain MRI and call you with the results.

## 2015-07-27 NOTE — Progress Notes (Signed)
Subjective:    Patient ID: [name] is a 81 y.o. male.  HPI    Interim history:   [name] is a 81 year old right-handed gentleman with an underlying medical history of aortic stenosis, coronary artery disease, status post CABG and St. Jude aortic valve replacement in October 2003, chronic atrial fibrillation, hypertension, hyperlipidemia, T12 compression fracture with status post kyphoplasty, diverticulitis, renal cell mass and treatment resistant urinary incontinence, followed by urology, who presents for follow-up consultation of his gait disorder and balance problems, as well as OSA, on treatment with CPAP. The patient is unaccompanied today drove himself to the appointment. He had to cancel an appointment for 05/31/2015. I last saw him on 11/29/2014, at which time he reported that he initially had trouble adjusting to CPAP but then had been compliant. He felt that he no longer snoring but was not sure if he slept much better than before. But he did endorse feeling better rested when he first woke up. He had no recent falls. He was using a cane. He denied any restless leg symptoms to speak of. He had no recent medication changes. He was not always drink enough water. He was not exercising regularly. His knees were bothering him. He was getting a procedure for bladder hyperactivity. I asked him to continue to be compliant with CPAP therapy, advised him to change positions slowly, drink more water, and use his cane or 2 wheeled walker at all times for safety. I reordered a brain MRI as he had not had it done when I first ordered it.   Today, 07/27/2015: I reviewed his CPAP compliance data from 06/26/2015 through 07/25/2015 which is a total of 30 days during which time he used his machine every night, with percent used days greater than 4 hours of 100%, indicating superb compliance with an average usage of 7 hours and 34 minutes, residual AHI low at 0.7 per hour, leak acceptable with the 95th  percentile at 10.1 L/m at a pressure of 10 cm with EPR of 3.  Today, 07/27/2015: He reports doing reasonably well. He actually feels he sleeps better with CPAP. He is wondering whether he needs to use this long-term. I advised him that this should be on lifelong treatment at this time. He still has nocturia, about every 2 hours each night. He has noticed that he is off-balance when the lights are off and when he does move quickly. He is careful to change positions slowly but has had a recent fall in the past few months. He scraped his right forearm. Thankfully he did not injure himself. He does admit that he has not had a brain MRI but does not know exactly why. We will reorder it today. He had a recent CT abdomen and pelvis for checkup of his renal mass, on 06/30/2015: 1. Stable ablation site in the medial aspect of the RIGHT kidney with no evidence of local recurrence. 2. Bosniak 1 and Bosniak 2 renal cysts of the RIGHT kidney.  Previously:   I first met him on 08/31/2014 at the request of his primary care physician, at which time he reported a 6 month history of balance problems and recurrent falls. He also reported nonrestorative sleep, daytime tiredness and snoring. At the time of his initial visit, I felt he most likely had a multifactorial gait disturbance secondary to aging, degenerative back disease with abnormal posture, possible medication side effects, in particular with anticholinergic medications, dehydration, and in the context of nonrestorative sleep. He had  symptoms of OSA. I advised him to have a sleep study. He had a split-night sleep study on 09/10/2014 and went over his test results with him in detail today. Baseline sleep efficiency was 72.7%, latency to sleep was 24 minutes, wake after sleep onset was 10.5 minutes with mild sleep fragmentation noted. He had an elevated arousal index. He had absence of REM sleep prior to CPAP initiation. He had A. fib on EKG. He had severe periodic leg  movements but minimal arousals. He had mild to moderate and loud snoring. Total AHI was 54.6 per hour. Baseline oxygen saturation was 93%, nadir was 86%. He was titrated on CPAP during the rest of the study starting at approximately 11:10 PM. Arousal index was normal. REM sleep was 6.6% after CPAP initiation. Average oxygen saturation was 94%, nadir was 91%. He had no significant PLMS during the treatment portion of the study. CPAP was titrated from 5-11 cm and his AHI was 0 per hour on a pressure of 10 cm. Based on his sleep test results are prescribed CPAP therapy for home use. I also requested that he have a brain MRI but this was not done and he is not sure why it was not done and was not aware of the MRI order.  I reviewed his compliance data with CPAP therapy from 10/25/2014 through 11/23/2014 which is a total of 30 days during which time he used his machine every night with percent used days greater than 4 hours at 100%, indicating superb compliance with an average usage of 4 hours and 27 minutes for all nights. Residual AHI low at 0.7 per hour. Leak acceptable with the 95th percentile at 15 L/m on a pressure of 10 cm with EPR of 3.  Has had balance problems and recurrent falls for the past 6 months. He has had physical therapy twice a week for about a month which was not necessarily very helpful. He has been advised to use his walker at all times. He has fallen repeatedly. He has had some shuffling of his walking. He has had severe nocturia, 5-6 times per night for the past 5-6 months. He has been on Vesicare, but will switch to Myrbetriq samples soon. He has occasional morning headaches. He does not wake up rested. He snores some. He has never had a sleep study. He drinks 2 cups of coffee, but not enough water. He quit smoking in 81 and does not drink alcohol.   He denies vertigo type symptoms. Sometimes he feels lightheaded when he stands quickly. He is particularly insecure in the dark. He still  drives but not in the dark. He has not had any recent blood work. He fell yesterday as he was trying to sit on a little stool which did not carry his weight and broke. He hit his head backwards but does not have any residual symptoms. He did not lose consciousness.   His Past Medical History Is Significant For: Past Medical History  Diagnosis Date  . Hypokalemia   . Hyperlipidemia   . Atrial fibrillation ([place])     longterm persistent  . CAD (coronary artery disease)   . HTN (hypertension)   . Shortness of breath     PT STATES RELATED TO HIS AF AND HEART BEING OUT OF RHYTHM  . Back pain, chronic     SINCE BACK INJURY / FRACTURE  . GERD (gastroesophageal reflux disease)   . Arthritis   . Cough 06/16/12    C/O OF COUGH /  COLD FOR A COUPLE OF WEEKS  . Cancer ([facility])     RIGHT RENAL  . Aortic valve replaced   . Stomach ulcer   . Constipation   . BPH (benign prostatic hyperplasia)     grapey  . Diverticulitis     history  . Right renal mass     renal cell carcinoma  . Kidney disorder     ablation defect in the right kidney lower pole but progression of interpolar lesion: stable on repeat CT (06/2014)  . Elevated fasting glucose 2012    His Past Surgical History Is Significant For: Past Surgical History  Procedure Laterality Date  . Aortic valve replacement    . Coronary artery bypass graft    . Thoracic t12 compression fracture    . Radiofrequency ablation kidney      RIGHT (two surgeries)  . Cholecystectomy N/A 09/08/2013    Procedure: LAPAROSCOPIC CHOLECYSTECTOMY WITH INTRAOPERATIVE CHOLANGIOGRAM;  Surgeon: Odis Hollingshead, MD;  Location: [facility] ORS;  Service: General;  Laterality: N/A;  . Left heart catheterization with coronary/graft angiogram N/A 09/07/2013    Procedure: LEFT HEART CATHETERIZATION WITH Beatrix Fetters;  Surgeon: Sinclair Grooms, MD;  Location: [facility] CATH LAB;  Service: Cardiovascular;  Laterality: N/A;  . Knee surgery      His Family History Is  Significant For: Family History  Problem Relation Age of Onset  . Hypertension Sister   . Diabetes Sister   . Hypertension Mother   . Diabetes Mother   . Hypertension Brother   . Diabetes Brother     His Social History Is Significant For: Social History   Social History  . Marital Status: Married    Spouse Name: N/A  . Number of Children: 2  . Years of Education: N/A   Occupational History  .      retired   Social History Main Topics  . Smoking status: Former Smoker -- 2.00 packs/day for 20 years    Types: Cigarettes    Quit date: 08/27/1977  . Smokeless tobacco: Never Used  . Alcohol Use: No  . Drug Use: No  . Sexual Activity: Not Asked   Other Topics Concern  . None   Social History Narrative   Lives with his wife, good family support, ambulates without assist device.   Caffeine 2 cups daily.     His Allergies Are:  No Known Allergies:   His Current Medications Are:  Outpatient Encounter Prescriptions as of 07/27/2015  Medication Sig  . acetaminophen (TYLENOL) 325 MG tablet Take 650 mg by mouth every 6 (six) hours as needed for mild pain.   [name] amoxicillin (AMOXIL) 500 MG capsule Take 500 mg by mouth as needed.  [name] aspirin EC 81 MG EC tablet Take 1 tablet (81 mg total) by mouth daily.  [name] atorvastatin (LIPITOR) 40 MG tablet Take 40 mg by mouth daily.  . carvedilol (COREG) 25 MG tablet Take 1 tablet (25 mg total) by mouth 2 (two) times daily with a meal.  . cholecalciferol (VITAMIN D) 1000 UNITS tablet Take 1,000 Units by mouth daily.  . digoxin (LANOXIN) 0.125 MG tablet TAKE ONE TABLET BY MOUTH ONCE DAILY  . docusate sodium (COLACE) 50 MG capsule Take 50 mg by mouth 2 (two) times daily.  . famotidine (PEPCID) 20 MG tablet Take 20 mg by mouth 2 (two) times daily.  [name] KLOR-CON 8 MEQ tablet TAKE ONE TABLET BY MOUTH ONCE DAILY  . Multiple Vitamin (MULTIVITAMIN WITH MINERALS) TABS tablet  Take 1 tablet by mouth daily.  . nitroGLYCERIN (NITROSTAT) 0.4 MG SL tablet  Place 1 tablet (0.4 mg total) under the tongue every 5 (five) minutes as needed. For chest pain  . oxybutynin (DITROPAN-XL) 10 MG 24 hr tablet Take 10 mg by mouth at bedtime.  . ramipril (ALTACE) 5 MG capsule Take 5 mg by mouth daily.  . ranitidine (ZANTAC) 150 MG tablet Take 150 mg by mouth 2 (two) times daily as needed for heartburn.   . Sennosides (SENNA LAX PO) Take 50 mg by mouth as needed.  . Tamsulosin HCl (FLOMAX) 0.4 MG CAPS Take 0.4 mg by mouth daily.   [name] torsemide (DEMADEX) 10 MG tablet Take 1 tablet (10 mg total) by mouth 2 (two) times daily.  . vitamin B-12 (CYANOCOBALAMIN) 1000 MCG tablet Take 1,000 mcg by mouth daily.    [name] warfarin (COUMADIN) 5 MG tablet TAKE AS DIRECTED BY COUMADIN CLINIC.  . [DISCONTINUED] warfarin (COUMADIN) 5 MG tablet Take as directed by Coumadin Clinic   No facility-administered encounter medications on file as of 07/27/2015.  :  Review of Systems:  Out of a complete 14 point review of systems, all are reviewed and negative with the exception of these symptoms as listed below:  Review of Systems  Neurological:       Patient is has some questions about CPAP. Asking how long he should stay on it.     Objective:  Neurologic Exam  Physical Exam Physical Examination:   Filed Vitals:   07/27/15 1550  BP: 152/68  Pulse: 72  Resp: 18    General Examination: The patient is a very pleasant 81 y.o. male in no acute distress. He appears well-developed and well-nourished and well groomed. He denies any vertigo, lightheadedness upon standing, chest pain, shortness of breath, headache or blurry vision. He brought his cane.  HEENT: Normocephalic, atraumatic, pupils are equal, round and reactive to light and accommodation. Funduscopic exam is normal with sharp disc margins noted. Extraocular tracking is good without limitation to gaze excursion or nystagmus noted. Normal smooth pursuit is noted. Hearing is mildly impaired and he has bilateral hearing aids in  place. Face is symmetric with normal facial animation and normal facial sensation. Speech is clear with no dysarthria noted. There is no hypophonia. There is no lip, neck/head, jaw or voice tremor. Neck is supple with full range of passive and active motion. There are no carotid bruits on auscultation. Oropharynx exam reveals: moderate mouth dryness, adequate dental hygiene and moderate airway crowding, due to redundant soft palate and wider uvula. Tonsils are small. Mallampati is class II. Tongue protrudes centrally and palate elevates symmetrically.   Chest: Clear to auscultation without wheezing, rhonchi or crackles noted.  Heart: Irregular and he has a systolic murmur, unchanged.    Abdomen: Soft, non-tender and non-distended with normal bowel sounds appreciated on auscultation.  Extremities: There is trace pitting edema in the distal lower extremities bilaterally, left more than right. Pedal pulses are intact.  Skin: Warm and dry without trophic changes noted. There are no varicose veins.  Musculoskeletal: exam reveals no obvious joint deformities, tenderness or joint swelling or erythema.   Neurologically:  Mental status: The patient is awake, alert and oriented in all 4 spheres. His immediate and remote memory, attention, language skills and fund of knowledge are appropriate. There is no evidence of aphasia, agnosia, apraxia or anomia. Speech is clear with normal prosody and enunciation. Thought process is linear. Mood is normal and affect is normal.  Cranial nerves II - XII are as described above under HEENT exam. In addition: shoulder shrug is normal with equal shoulder height noted. Motor exam: Normal bulk, strength and tone is noted. There is no drift, tremor or rebound. Romberg is positive. Reflexes are 2+ throughout. Fine motor skills and coordination: slightly slow throughout.  Cerebellar testing: No dysmetria or intention tremor on finger to nose testing. Heel to shin is unremarkable  bilaterally. There is no truncal ataxia.  Sensory exam: intact to light touch in the upper and lower extremities.  Gait, station and balance: He stands with mild difficulty. No veering to one side is noted. His upper body is slightly tilted to the right. Posture is  mildly stooped and may be age-appropriate and stance is  mildly wide-based. These are unchanged for the most part. He walks fairly well with a single Pontocaine on the right side. He turns slowly. Tandem walk is not possible.  Assessment and Plan:   In summary, [name] is a very pleasant 81 year old male with an underlying medical history of aortic stenosis, coronary artery disease, status post CABG and St. Jude aortic valve replacement in October 2003, chronic atrial fibrillation, hypertension, hyperlipidemia, T12 compression fracture with status post kyphoplasty, diverticulitis, renal cell mass and treatment resistant urinary incontinence, who presents for follow-up consultation of his gait and balance problems, history of falls for the past year or so and history of sleep apnea, now established on CPAP therapy. He is fully compliant with CPAP treatment and we talked about his split-night sleep study results from January 2016. We talked about his most recent compliance data as well and he is commended on his treatment adherence. His gait appears to be fairly stable and he is encouraged to continue using his cane for safety. I had previously ordered a brain MRI which I will reorder today. He has recently had a CT abdomen and pelvis because of his history of renal mass and there was no evidence of recurrence of renal cancer. We can do the MRI without contrast at this time. He is again advised to change positions slowly, drink more water, and use his cane or 2 wheeled walker for safety. I will see him back in about 6 months, sooner if the need arises. I did not suggest any new medications at this time. I spent 25 minutes in total  face-to-face time with the patient, more than 50% of which was spent in counseling and coordination of care, reviewing test results, reviewing medication and discussing or reviewing the diagnosis of OSA and gait d/o, the prognosis and treatment options.

## 2015-07-29 ENCOUNTER — Telehealth: Payer: Self-pay | Admitting: Neurology

## 2015-07-29 NOTE — Telephone Encounter (Signed)
Patient's daughter called to cancel pt's MRI @ [facility] on 08/09/15. She explained that the patient wants to hold off until next year due to his insurance changing next year. Thanks!

## 2015-08-02 ENCOUNTER — Other Ambulatory Visit: Payer: Self-pay

## 2015-08-02 MED ORDER — CARVEDILOL 25 MG PO TABS: 25.0000 mg | ORAL_TABLET | Freq: Two times a day (BID) | ORAL | Status: DC

## 2015-08-02 NOTE — Telephone Encounter (Signed)
Josue Hector, MD at 10/04/2014 7:52 AM  carvedilol (COREG) 25 MG tabletTake 1 tablet (25 mg total) by mouth 2 (two) times daily with a meal Your physician recommends that you continue on your current medications as directed. Please refer to the Current Medication list given to you today.

## 2015-08-08 ENCOUNTER — Telehealth: Payer: Self-pay | Admitting: Cardiovascular Disease

## 2015-08-08 NOTE — Telephone Encounter (Signed)
Spoke with pharmacy as they had left a voicemail on the refill line. Made them aware that this was sent in last week to [facility] and they will have it transferred. They will also let patient know that he needs to call and schedule an appointment for further refills.

## 2015-08-08 NOTE — Telephone Encounter (Signed)
New Message    *STAT* If patient is at the pharmacy, call can be transferred to refill team.   1. Which medications need to be refilled? (please list name of each medication and dose if known) Carvedilol  25mg   2. Which pharmacy/location (including street and city if local pharmacy) is medication to be sent to?  [facility] on [address]  3. Do they need a 30 day or 90 day supply? 90 day

## 2015-08-09 ENCOUNTER — Ambulatory Visit (HOSPITAL_COMMUNITY): Payer: Medicare Other

## 2015-08-23 ENCOUNTER — Ambulatory Visit (INDEPENDENT_AMBULATORY_CARE_PROVIDER_SITE_OTHER): Payer: Medicare Other | Admitting: *Deleted

## 2015-08-23 DIAGNOSIS — I359 Nonrheumatic aortic valve disorder, unspecified: Secondary | ICD-10-CM | POA: Diagnosis not present

## 2015-08-23 DIAGNOSIS — I4891 Unspecified atrial fibrillation: Secondary | ICD-10-CM

## 2015-08-23 DIAGNOSIS — Z954 Presence of other heart-valve replacement: Secondary | ICD-10-CM

## 2015-08-23 DIAGNOSIS — Z7901 Long term (current) use of anticoagulants: Secondary | ICD-10-CM | POA: Diagnosis not present

## 2015-08-23 DIAGNOSIS — Z5181 Encounter for therapeutic drug level monitoring: Secondary | ICD-10-CM

## 2015-08-23 DIAGNOSIS — Z952 Presence of prosthetic heart valve: Secondary | ICD-10-CM

## 2015-08-23 LAB — POCT INR: INR: 2.6

## 2015-08-25 ENCOUNTER — Other Ambulatory Visit: Payer: Self-pay | Admitting: Cardiovascular Disease

## 2015-09-15 ENCOUNTER — Other Ambulatory Visit: Payer: Self-pay | Admitting: Cardiovascular Disease

## 2015-10-04 ENCOUNTER — Ambulatory Visit (INDEPENDENT_AMBULATORY_CARE_PROVIDER_SITE_OTHER): Payer: PPO | Admitting: *Deleted

## 2015-10-04 DIAGNOSIS — Z5181 Encounter for therapeutic drug level monitoring: Secondary | ICD-10-CM

## 2015-10-04 DIAGNOSIS — Z952 Presence of prosthetic heart valve: Secondary | ICD-10-CM

## 2015-10-04 DIAGNOSIS — I4891 Unspecified atrial fibrillation: Secondary | ICD-10-CM

## 2015-10-04 DIAGNOSIS — I359 Nonrheumatic aortic valve disorder, unspecified: Secondary | ICD-10-CM

## 2015-10-04 DIAGNOSIS — Z7901 Long term (current) use of anticoagulants: Secondary | ICD-10-CM

## 2015-10-04 DIAGNOSIS — Z954 Presence of other heart-valve replacement: Secondary | ICD-10-CM

## 2015-10-04 LAB — POCT INR: INR: 2.3

## 2015-10-13 ENCOUNTER — Other Ambulatory Visit: Payer: Self-pay | Admitting: Cardiovascular Disease

## 2015-10-20 ENCOUNTER — Other Ambulatory Visit: Payer: Self-pay | Admitting: Cardiovascular Disease

## 2015-10-20 ENCOUNTER — Other Ambulatory Visit: Payer: Self-pay

## 2015-10-20 MED ORDER — NITROGLYCERIN 0.4 MG SL SUBL: 0.4000 mg | SUBLINGUAL_TABLET | SUBLINGUAL | Status: AC | PRN

## 2015-10-28 ENCOUNTER — Other Ambulatory Visit: Payer: Self-pay | Admitting: Cardiovascular Disease

## 2015-10-31 NOTE — Progress Notes (Signed)
82 y.o. male with  a history of CAD, Aortic stenosis, status post CABG and St. Jude aortic valve replacement 05/2002, permanent atrial fibrillation, hypertension, hyperlipidemia. Last nuclear study 04/2010: Low-risk study with mild fixed basal to mid inferior perfusion defect defect representing diaphragmatic attenuation versus prior infarct, no ischemia.  BP improved with addition of norvasc    Memory has gotten quite poor   Hospitalized for abdominal pain  1/15 with cholecystitis    Troponins elevated no acute ECG changes EF 65% by echo Cath done by Dr Tamala Julian  prior to surgery 09/07/13  OK and medical Rx recommended   IMPRESSIONS: 1. Widely patent saphenous vein grafts as noted above. Graft to the LAD via the internal mammary is also widely patent. 2. 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 3. Potential ischemia in the third obtuse marginal territory as flow is compromised by obstruction in the second obtuse marginal proximal to the vein graft and the 80% stenosis in the mid circumflex. This territory is relatively small and should be treatable with medical therapy. If refractory angina, stenting of the mid circumflex would resolve this problem. This does not seem prudent in this situation given the need for general surgery and the absence of symptoms.  Had uncomplicated GB removal   Echo 09/03/13 Study Conclusions  - Left ventricle: The cavity size was normal. Wall thickness was increased in a pattern of mild LVH. Systolic function was normal. The estimated ejection fraction was in the range of 55% to 60%. - Aortic valve: AV prosthesis opens well. Peak and mean gradients through the valve are 29 and 15 mm Hg respectively. Trivial regurgitation. - Mitral valve: Mild regurgitation. - Pulmonary arteries: PA peak pressure: 59mm Hg (S).  Some right knee pain and sub xiphoid prominence  Not happy with  Dr Risa Grill urology.  Spastic bladder medicine vesicare and other too expensive  Wearing "pad"    Lost a lot of weight Scared about having DM.  Seeing Theadore Nan as primary now   ROS: Denies fever, malais, weight loss, blurry vision, decreased visual acuity, cough, sputum, SOB, hemoptysis, pleuritic pain, palpitaitons, heartburn, abdominal pain, melena, lower extremity edema, claudication, or rash.  All other systems reviewed and negative  General: Affect appropriate Healthy:  appears stated age 82: normal Neck supple with no adenopathy JVP normal no bruits no thyromegaly Lungs clear with no wheezing and good diaphragmatic motion Heart:  [id number] click SEM no AR murmur, no rub, gallop or click PMI normal Abdomen: benighn, BS positve, no tenderness, no AAA no bruit.  No HSM or HJR Distal pulses intact with no bruits No edema Neuro non-focal Skin warm and dry No muscular weakness   Current Outpatient Prescriptions  Medication Sig Dispense Refill  . aspirin EC 81 MG EC tablet Take 1 tablet (81 mg total) by mouth daily. 30 tablet 0  . atorvastatin (LIPITOR) 40 MG tablet Take 40 mg by mouth daily.  3  . carvedilol (COREG) 25 MG tablet TAKE 1 TABLET BY MOUTH TWICE DAILY WITH A MEAL 60 tablet 0  . cholecalciferol (VITAMIN D) 1000 UNITS tablet Take 1,000 Units by mouth daily.    [name] DIGOX 125 MCG tablet TAKE 1 TABLET BY MOUTH DAILY 30 tablet 0  . famotidine (PEPCID) 20 MG tablet Take 20 mg by mouth 2 (two) times daily.    . Multiple Vitamin (MULTIVITAMIN WITH MINERALS) TABS tablet Take 1 tablet  by mouth daily.    . nitroGLYCERIN (NITROSTAT) 0.4 MG SL tablet Place 1 tablet (0.4 mg total) under the tongue every 5 (five) minutes as needed. For chest pain 25 tablet 5  . oxybutynin (DITROPAN-XL) 10 MG 24 hr tablet Take 10 mg by mouth at bedtime.    . potassium chloride (KLOR-CON) 8 MEQ tablet TAKE 1 TABLET BY MOUTH EVERY DAY 30 tablet 0  . ramipril (ALTACE) 5 MG capsule Take 5 mg by mouth daily.     . Tamsulosin HCl (FLOMAX) 0.4 MG CAPS Take 0.4 mg by mouth daily.     . Thiamine HCl (VITAMIN B-1) 250 MG tablet Take 250 mg by mouth daily.    [name] torsemide (DEMADEX) 10 MG tablet TAKE 1 TABLET BY MOUTH TWICE DAILY 60 tablet 0  . vitamin B-12 (CYANOCOBALAMIN) 1000 MCG tablet Take 1,000 mcg by mouth daily.      [name] warfarin (COUMADIN) 5 MG tablet TAKE AS DIRECTED BY COUMADIN CLINIC. 180 tablet 0   No current facility-administered medications for this visit.    Allergies  Review of patient's allergies indicates no known allergies.  Electrocardiogram:  afib rate 74 otherwise normal  04/05/14    11/07/15 afib rate 56  No changes   Assessment and Plan AVR: needs f/u echo has not had one in over 2 years SBE  No change in SEM CAD: Stable with no angina and good activity level.  Continue medical Rx Afib: good rate control and anticoagulation seeing coumadin clinic today Chol:  No results found for: [facility]  Labs with primary  Memory: stable  HTN: Well controlled.  Continue current medications and low sodium Dash type diet.  BMET today DM:  Will check A1c today     Jenkins Rouge

## 2015-11-03 ENCOUNTER — Other Ambulatory Visit: Payer: Self-pay | Admitting: Cardiovascular Disease

## 2015-11-04 ENCOUNTER — Encounter: Payer: Self-pay | Admitting: Cardiovascular Disease

## 2015-11-07 ENCOUNTER — Ambulatory Visit (INDEPENDENT_AMBULATORY_CARE_PROVIDER_SITE_OTHER): Payer: PPO | Admitting: Cardiovascular Disease

## 2015-11-07 ENCOUNTER — Ambulatory Visit (INDEPENDENT_AMBULATORY_CARE_PROVIDER_SITE_OTHER): Payer: PPO | Admitting: Pharmacist

## 2015-11-07 ENCOUNTER — Encounter: Payer: Self-pay | Admitting: Cardiovascular Disease

## 2015-11-07 VITALS — BP 140/80 | HR 52 | Ht 76.0 in | Wt 222.4 lb

## 2015-11-07 DIAGNOSIS — Z7901 Long term (current) use of anticoagulants: Secondary | ICD-10-CM | POA: Diagnosis not present

## 2015-11-07 DIAGNOSIS — Z79899 Other long term (current) drug therapy: Secondary | ICD-10-CM | POA: Diagnosis not present

## 2015-11-07 DIAGNOSIS — I359 Nonrheumatic aortic valve disorder, unspecified: Secondary | ICD-10-CM

## 2015-11-07 DIAGNOSIS — Z954 Presence of other heart-valve replacement: Secondary | ICD-10-CM

## 2015-11-07 DIAGNOSIS — Z952 Presence of prosthetic heart valve: Secondary | ICD-10-CM

## 2015-11-07 DIAGNOSIS — Z5181 Encounter for therapeutic drug level monitoring: Secondary | ICD-10-CM

## 2015-11-07 DIAGNOSIS — I4891 Unspecified atrial fibrillation: Secondary | ICD-10-CM | POA: Diagnosis not present

## 2015-11-07 DIAGNOSIS — R011 Cardiac murmur, unspecified: Secondary | ICD-10-CM | POA: Diagnosis not present

## 2015-11-07 LAB — BASIC METABOLIC PANEL
BUN: 20 mg/dL (ref 7–25)
CO2: 27 mmol/L (ref 20–31)
CREATININE: 0.95 mg/dL (ref 0.70–1.18)
Calcium: 9.5 mg/dL (ref 8.6–10.3)
Chloride: 101 mmol/L (ref 98–110)
GLUCOSE: 102 mg/dL — AB (ref 65–99)
Potassium: 4.2 mmol/L (ref 3.5–5.3)
SODIUM: 137 mmol/L (ref 135–146)

## 2015-11-07 LAB — HEMOGLOBIN A1C
Hgb A1c MFr Bld: 5.8 % — ABNORMAL HIGH (ref <5.7)
Mean Plasma Glucose: 120 mg/dL — ABNORMAL HIGH (ref <117)

## 2015-11-07 LAB — POCT INR: INR: 2.9

## 2015-11-07 NOTE — Patient Instructions (Signed)
Medication Instructions:  Your physician recommends that you continue on your current medications as directed. Please refer to the Current Medication list given to you today.  Labwork: Your physician recommends that you have lab work today BMET and HgbA1c   Testing/Procedures: Your physician has requested that you have an echocardiogram. Echocardiography is a painless test that uses sound waves to create images of your heart. It provides your doctor with information about the size and shape of your heart and how well your heart's chambers and valves are working. This procedure takes approximately one hour. There are no restrictions for this procedure.  Follow-Up: Your physician wants you to follow-up in: 6 months with Dr. Johnsie Cancel. You will receive a reminder letter in the mail two months in advance. If you don't receive a letter, please call our office to schedule the follow-up appointment.   Any Other Special Instructions Will Be Listed Below (If Applicable).     If you need a refill on your cardiac medications before your next appointment, please call your pharmacy.

## 2015-11-08 ENCOUNTER — Telehealth: Payer: Self-pay

## 2015-11-08 MED ORDER — POTASSIUM CHLORIDE ER 8 MEQ PO TBCR: 8.0000 meq | EXTENDED_RELEASE_TABLET | Freq: Every day | ORAL | Status: DC

## 2015-11-08 NOTE — Telephone Encounter (Signed)
Reordered patient's potassium prescription.

## 2015-11-08 NOTE — Telephone Encounter (Signed)
-----   Message from Josue Hector, MD sent at 11/07/2015 11:51 PM EDT ----- Labs reviewed and are normal.  No changes in medication or F/U needed.

## 2015-11-09 DIAGNOSIS — N401 Enlarged prostate with lower urinary tract symptoms: Secondary | ICD-10-CM | POA: Diagnosis not present

## 2015-11-09 DIAGNOSIS — N138 Other obstructive and reflux uropathy: Secondary | ICD-10-CM | POA: Diagnosis not present

## 2015-11-09 DIAGNOSIS — R351 Nocturia: Secondary | ICD-10-CM | POA: Diagnosis not present

## 2015-11-09 DIAGNOSIS — I10 Essential (primary) hypertension: Secondary | ICD-10-CM | POA: Diagnosis not present

## 2015-11-21 ENCOUNTER — Other Ambulatory Visit: Payer: Self-pay | Admitting: Cardiovascular Disease

## 2015-11-21 ENCOUNTER — Other Ambulatory Visit: Payer: Self-pay

## 2015-11-21 ENCOUNTER — Ambulatory Visit (HOSPITAL_COMMUNITY): Payer: PPO | Attending: Cardiology

## 2015-11-21 DIAGNOSIS — I340 Nonrheumatic mitral (valve) insufficiency: Secondary | ICD-10-CM | POA: Diagnosis not present

## 2015-11-21 DIAGNOSIS — I359 Nonrheumatic aortic valve disorder, unspecified: Secondary | ICD-10-CM | POA: Diagnosis not present

## 2015-11-21 DIAGNOSIS — I77810 Thoracic aortic ectasia: Secondary | ICD-10-CM | POA: Insufficient documentation

## 2015-11-21 DIAGNOSIS — I2510 Atherosclerotic heart disease of native coronary artery without angina pectoris: Secondary | ICD-10-CM | POA: Diagnosis not present

## 2015-11-21 DIAGNOSIS — R011 Cardiac murmur, unspecified: Secondary | ICD-10-CM | POA: Diagnosis not present

## 2015-11-21 DIAGNOSIS — I071 Rheumatic tricuspid insufficiency: Secondary | ICD-10-CM | POA: Diagnosis not present

## 2015-11-21 DIAGNOSIS — Z952 Presence of prosthetic heart valve: Secondary | ICD-10-CM | POA: Diagnosis not present

## 2015-11-21 DIAGNOSIS — I272 Other secondary pulmonary hypertension: Secondary | ICD-10-CM | POA: Insufficient documentation

## 2015-11-21 DIAGNOSIS — I351 Nonrheumatic aortic (valve) insufficiency: Secondary | ICD-10-CM | POA: Diagnosis not present

## 2015-11-21 DIAGNOSIS — E785 Hyperlipidemia, unspecified: Secondary | ICD-10-CM | POA: Diagnosis not present

## 2015-11-21 DIAGNOSIS — I119 Hypertensive heart disease without heart failure: Secondary | ICD-10-CM | POA: Diagnosis not present

## 2015-12-05 ENCOUNTER — Other Ambulatory Visit: Payer: Self-pay | Admitting: Cardiovascular Disease

## 2015-12-13 DIAGNOSIS — I480 Paroxysmal atrial fibrillation: Secondary | ICD-10-CM | POA: Diagnosis not present

## 2015-12-13 DIAGNOSIS — Z79899 Other long term (current) drug therapy: Secondary | ICD-10-CM | POA: Diagnosis not present

## 2015-12-13 DIAGNOSIS — E785 Hyperlipidemia, unspecified: Secondary | ICD-10-CM | POA: Diagnosis not present

## 2015-12-13 DIAGNOSIS — I2510 Atherosclerotic heart disease of native coronary artery without angina pectoris: Secondary | ICD-10-CM | POA: Diagnosis not present

## 2015-12-13 DIAGNOSIS — R7301 Impaired fasting glucose: Secondary | ICD-10-CM | POA: Diagnosis not present

## 2015-12-13 DIAGNOSIS — G4733 Obstructive sleep apnea (adult) (pediatric): Secondary | ICD-10-CM | POA: Diagnosis not present

## 2015-12-13 DIAGNOSIS — I5032 Chronic diastolic (congestive) heart failure: Secondary | ICD-10-CM | POA: Diagnosis not present

## 2015-12-13 DIAGNOSIS — Z85528 Personal history of other malignant neoplasm of kidney: Secondary | ICD-10-CM | POA: Diagnosis not present

## 2015-12-13 DIAGNOSIS — E559 Vitamin D deficiency, unspecified: Secondary | ICD-10-CM | POA: Diagnosis not present

## 2015-12-13 DIAGNOSIS — Z87898 Personal history of other specified conditions: Secondary | ICD-10-CM | POA: Diagnosis not present

## 2015-12-13 DIAGNOSIS — I359 Nonrheumatic aortic valve disorder, unspecified: Secondary | ICD-10-CM | POA: Diagnosis not present

## 2015-12-13 DIAGNOSIS — I10 Essential (primary) hypertension: Secondary | ICD-10-CM | POA: Diagnosis not present

## 2015-12-20 ENCOUNTER — Ambulatory Visit (INDEPENDENT_AMBULATORY_CARE_PROVIDER_SITE_OTHER): Payer: PPO

## 2015-12-20 DIAGNOSIS — I359 Nonrheumatic aortic valve disorder, unspecified: Secondary | ICD-10-CM

## 2015-12-20 DIAGNOSIS — Z952 Presence of prosthetic heart valve: Secondary | ICD-10-CM

## 2015-12-20 DIAGNOSIS — Z7901 Long term (current) use of anticoagulants: Secondary | ICD-10-CM

## 2015-12-20 DIAGNOSIS — Z5181 Encounter for therapeutic drug level monitoring: Secondary | ICD-10-CM

## 2015-12-20 DIAGNOSIS — I4891 Unspecified atrial fibrillation: Secondary | ICD-10-CM | POA: Diagnosis not present

## 2015-12-20 DIAGNOSIS — Z954 Presence of other heart-valve replacement: Secondary | ICD-10-CM

## 2015-12-20 LAB — POCT INR: INR: 2.5

## 2015-12-31 ENCOUNTER — Other Ambulatory Visit: Payer: Self-pay | Admitting: Cardiovascular Disease

## 2016-01-16 ENCOUNTER — Other Ambulatory Visit: Payer: Self-pay | Admitting: Cardiovascular Disease

## 2016-01-17 DIAGNOSIS — N3941 Urge incontinence: Secondary | ICD-10-CM | POA: Diagnosis not present

## 2016-01-17 DIAGNOSIS — N138 Other obstructive and reflux uropathy: Secondary | ICD-10-CM | POA: Diagnosis not present

## 2016-01-17 DIAGNOSIS — N401 Enlarged prostate with lower urinary tract symptoms: Secondary | ICD-10-CM | POA: Diagnosis not present

## 2016-01-24 ENCOUNTER — Ambulatory Visit: Payer: Medicare Other | Admitting: Neurology

## 2016-01-27 ENCOUNTER — Ambulatory Visit (INDEPENDENT_AMBULATORY_CARE_PROVIDER_SITE_OTHER): Payer: PPO | Admitting: *Deleted

## 2016-01-27 DIAGNOSIS — Z5181 Encounter for therapeutic drug level monitoring: Secondary | ICD-10-CM

## 2016-01-27 DIAGNOSIS — I359 Nonrheumatic aortic valve disorder, unspecified: Secondary | ICD-10-CM | POA: Diagnosis not present

## 2016-01-27 DIAGNOSIS — I4891 Unspecified atrial fibrillation: Secondary | ICD-10-CM

## 2016-01-27 DIAGNOSIS — Z954 Presence of other heart-valve replacement: Secondary | ICD-10-CM | POA: Diagnosis not present

## 2016-01-27 DIAGNOSIS — Z7901 Long term (current) use of anticoagulants: Secondary | ICD-10-CM | POA: Diagnosis not present

## 2016-01-27 DIAGNOSIS — Z952 Presence of prosthetic heart valve: Secondary | ICD-10-CM

## 2016-01-27 LAB — POCT INR: INR: 2.3

## 2016-02-16 ENCOUNTER — Ambulatory Visit (INDEPENDENT_AMBULATORY_CARE_PROVIDER_SITE_OTHER): Payer: PPO | Admitting: Neurology

## 2016-02-16 ENCOUNTER — Encounter: Payer: Self-pay | Admitting: Neurology

## 2016-02-16 VITALS — BP 160/68 | HR 62 | Resp 18 | Ht 76.0 in | Wt 218.0 lb

## 2016-02-16 DIAGNOSIS — R269 Unspecified abnormalities of gait and mobility: Secondary | ICD-10-CM

## 2016-02-16 DIAGNOSIS — Z9989 Dependence on other enabling machines and devices: Principal | ICD-10-CM

## 2016-02-16 DIAGNOSIS — G4733 Obstructive sleep apnea (adult) (pediatric): Secondary | ICD-10-CM

## 2016-02-16 NOTE — Patient Instructions (Signed)
Please continue using your CPAP regularly. While your insurance requires that you use CPAP at least 4 hours each night on 70% of the nights, I recommend, that you not skip any nights and use it throughout the night if you can. Getting used to CPAP and staying with the treatment long term does take time and patience and discipline. Untreated obstructive sleep apnea when it is moderate to severe can have an adverse impact on cardiovascular health and raise her risk for heart disease, arrhythmias, hypertension, congestive heart failure, stroke and diabetes. Untreated obstructive sleep apnea causes sleep disruption, nonrestorative sleep, and sleep deprivation. This can have an impact on your day to day functioning and cause daytime sleepiness and impairment of cognitive function, memory loss, mood disturbance, and problems focussing. Using CPAP regularly can improve these symptoms.  Keep up the good work! I will see you back in 12 months for sleep apnea check up, and for your gait and balance.  Please use your cane for safety.  You do have a limp on the right.   Consider using Biotene for mouth dryness, comes in mouth wash, spray and toothpaste from what I know.

## 2016-02-16 NOTE — Progress Notes (Signed)
Subjective:    Patient ID: [name] is a 82 y.o. male.  HPI     Interim history:   [name] is a 82 year old right-handed gentleman with an underlying medical history of aortic stenosis, coronary artery disease, status post CABG and St. Jude aortic valve replacement in October 2003, chronic atrial fibrillation, hypertension, hyperlipidemia, T12 compression fracture with status post kyphoplasty, diverticulitis, renal cell mass and treatment resistant urinary incontinence, followed by urology, who presents for follow-up consultation of his gait disorder and balance problems, as well as OSA, on treatment with CPAP. The patient is unaccompanied today. I last saw him on 07/27/2015, at which time he reported doing reasonably well. He felt he was sleeping better with CPAP. We talked about the need for CPAP treatment ongoing. He was reporting nocturia which was fairly unchanged. He noted that he was more off balance when the lights were out and it was dark. He was trying to adjust to his balance: He problems. He reported one fall in the past few months. I reassured her and his brain MRI. He had a CT abdomen and pelvis for recheck of his renal mass on 06/30/2015 which I reviewed: 1. Stable ablation site in the medial aspect of the RIGHT kidney with no evidence of local recurrence. 2. Bosniak 1 and Bosniak 2 renal cysts of the RIGHT kidney.  I reordered a brain MRI. I asked him to use his cane or walker at all times and stay hydrated and compliant with CPAP therapy.  Today, 02/16/2016: I reviewed his CPAP compliance data from 01/16/2016 through 02/14/2016 which is a total of 30 days during which time he used his machine every night with percent used days greater than 4 hours at 100%, indicating superb compliance with an average usage of 6 hours and 27 minutes, residual AHI 0.8 per hour, leak low with the 95th percentile at 7.6 L/m on a pressure of 10 cm with EPR of 3.  Today, 02/16/2016: He reports  doing well, no recent falls, uses his cane outside, not always inside the house. Uses CPAP faithfully. Has questions about his sleep study test results, which we went over today. He reports right knee pain. He has seen orthopedics for this.  Previously:     He had to cancel an appointment for 05/31/2015. I saw him on 11/29/2014, at which time he reported that he initially had trouble adjusting to CPAP but then had been compliant. He felt that he no longer snoring but was not sure if he slept much better than before. But he did endorse feeling better rested when he first woke up. He had no recent falls. He was using a cane. He denied any restless leg symptoms to speak of. He had no recent medication changes. He was not always drink enough water. He was not exercising regularly. His knees were bothering him. He was getting a procedure for bladder hyperactivity. I asked him to continue to be compliant with CPAP therapy, advised him to change positions slowly, drink more water, and use his cane or 2 wheeled walker at all times for safety. I reordered a brain MRI as he had not had it done when I first ordered it.   I reviewed his CPAP compliance data from 06/26/2015 through 07/25/2015 which is a total of 30 days during which time he used his machine every night, with percent used days greater than 4 hours of 100%, indicating superb compliance with an average usage of 7 hours and 34 minutes,  residual AHI low at 0.7 per hour, leak acceptable with the 95th percentile at 10.1 L/m at a pressure of 10 cm with EPR of 3.  I first met him on 08/31/2014 at the request of his primary care physician, at which time he reported a 6 month history of balance problems and recurrent falls. He also reported nonrestorative sleep, daytime tiredness and snoring. At the time of his initial visit, I felt he most likely had a multifactorial gait disturbance secondary to aging, degenerative back disease with abnormal posture, possible  medication side effects, in particular with anticholinergic medications, dehydration, and in the context of nonrestorative sleep. He had symptoms of OSA. I advised him to have a sleep study. He had a split-night sleep study on 09/10/2014 and went over his test results with him in detail today. Baseline sleep efficiency was 72.7%, latency to sleep was 24 minutes, wake after sleep onset was 10.5 minutes with mild sleep fragmentation noted. He had an elevated arousal index. He had absence of REM sleep prior to CPAP initiation. He had A. fib on EKG. He had severe periodic leg movements but minimal arousals. He had mild to moderate and loud snoring. Total AHI was 54.6 per hour. Baseline oxygen saturation was 93%, nadir was 86%. He was titrated on CPAP during the rest of the study starting at approximately 11:10 PM. Arousal index was normal. REM sleep was 6.6% after CPAP initiation. Average oxygen saturation was 94%, nadir was 91%. He had no significant PLMS during the treatment portion of the study. CPAP was titrated from 5-11 cm and his AHI was 0 per hour on a pressure of 10 cm. Based on his sleep test results are prescribed CPAP therapy for home use. I also requested that he have a brain MRI but this was not done and he is not sure why it was not done and was not aware of the MRI order.  I reviewed his compliance data with CPAP therapy from 10/25/2014 through 11/23/2014 which is a total of 30 days during which time he used his machine every night with percent used days greater than 4 hours at 100%, indicating superb compliance with an average usage of 4 hours and 27 minutes for all nights. Residual AHI low at 0.7 per hour. Leak acceptable with the 95th percentile at 15 L/m on a pressure of 10 cm with EPR of 3.  Has had balance problems and recurrent falls for the past 6 months. He has had physical therapy twice a week for about a month which was not necessarily very helpful. He has been advised to use his walker at  all times. He has fallen repeatedly. He has had some shuffling of his walking. He has had severe nocturia, 5-6 times per night for the past 5-6 months. He has been on Vesicare, but will switch to Myrbetriq samples soon. He has occasional morning headaches. He does not wake up rested. He snores some. He has never had a sleep study. He drinks 2 cups of coffee, but not enough water. He quit smoking in 81 and does not drink alcohol.   He denies vertigo type symptoms. Sometimes he feels lightheaded when he stands quickly. He is particularly insecure in the dark. He still drives but not in the dark. He has not had any recent blood work. He fell yesterday as he was trying to sit on a little stool which did not carry his weight and broke. He hit his head backwards but does not have any  residual symptoms. He did not lose consciousness.    His Past Medical History Is Significant For: Past Medical History  Diagnosis Date  . Hypokalemia   . Hyperlipidemia   . Atrial fibrillation ([place])     longterm persistent  . CAD (coronary artery disease)   . HTN (hypertension)   . Shortness of breath     PT STATES RELATED TO HIS AF AND HEART BEING OUT OF RHYTHM  . Back pain, chronic     SINCE BACK INJURY / FRACTURE  . GERD (gastroesophageal reflux disease)   . Arthritis   . Cough 06/16/12    C/O OF COUGH / COLD FOR A COUPLE OF WEEKS  . Cancer (HCC)     RIGHT RENAL  . Aortic valve replaced   . Stomach ulcer   . Constipation   . BPH (benign prostatic hyperplasia)     grapey  . Diverticulitis     history  . Right renal mass     renal cell carcinoma  . Kidney disorder     ablation defect in the right kidney lower pole but progression of interpolar lesion: stable on repeat CT (06/2014)  . Elevated fasting glucose 2012    His Past Surgical History Is Significant For: Past Surgical History  Procedure Laterality Date  . Aortic valve replacement    . Coronary artery bypass graft    . Thoracic t12 compression  fracture    . Radiofrequency ablation kidney      RIGHT (two surgeries)  . Cholecystectomy N/A 09/08/2013    Procedure: LAPAROSCOPIC CHOLECYSTECTOMY WITH INTRAOPERATIVE CHOLANGIOGRAM;  Surgeon: Odis Hollingshead, MD;  Location: [facility];  Service: General;  Laterality: N/A;  . Left heart catheterization with coronary/graft angiogram N/A 09/07/2013    Procedure: LEFT HEART CATHETERIZATION WITH Beatrix Fetters;  Surgeon: Sinclair Grooms, MD;  Location: [facility] CATH LAB;  Service: Cardiovascular;  Laterality: N/A;  . Knee surgery      His Family History Is Significant For: Family History  Problem Relation Age of Onset  . Hypertension Sister   . Diabetes Sister   . Hypertension Mother   . Diabetes Mother   . Hypertension Brother   . Diabetes Brother     His Social History Is Significant For: Social History   Social History  . Marital Status: Married    Spouse Name: N/A  . Number of Children: 2  . Years of Education: N/A   Occupational History  .      retired   Social History Main Topics  . Smoking status: Former Smoker -- 2.00 packs/day for 20 years    Types: Cigarettes    Quit date: 08/27/1977  . Smokeless tobacco: Never Used  . Alcohol Use: No  . Drug Use: No  . Sexual Activity: Not Asked   Other Topics Concern  . None   Social History Narrative   Lives with his wife, good family support, ambulates without assist device.   Caffeine 2 cups daily.     His Allergies Are:  No Known Allergies:   His Current Medications Are:  Outpatient Encounter Prescriptions as of 02/16/2016  Medication Sig  . aspirin EC 81 MG EC tablet Take 1 tablet (81 mg total) by mouth daily.  [name] atorvastatin (LIPITOR) 40 MG tablet Take 40 mg by mouth daily.  . carvedilol (COREG) 25 MG tablet TAKE 1 TABLET BY MOUTH TWICE DAILY WITH A MEAL  . cholecalciferol (VITAMIN D) 1000 UNITS tablet Take 1,000 Units by mouth  daily.  [name] DIGOX 125 MCG tablet TAKE 1 TABLET BY MOUTH DAILY  . famotidine  (PEPCID) 20 MG tablet Take 20 mg by mouth 2 (two) times daily.  . Multiple Vitamin (MULTIVITAMIN WITH MINERALS) TABS tablet Take 1 tablet by mouth daily.  . nitroGLYCERIN (NITROSTAT) 0.4 MG SL tablet Place 1 tablet (0.4 mg total) under the tongue every 5 (five) minutes as needed. For chest pain  . oxybutynin (DITROPAN-XL) 10 MG 24 hr tablet Take 10 mg by mouth at bedtime.  . potassium chloride (KLOR-CON) 8 MEQ tablet TAKE 1 TABLET BY MOUTH EVERY DAY  . potassium chloride (KLOR-CON) 8 MEQ tablet TAKE 1 TABLET(8 MEQ) BY MOUTH DAILY  . ramipril (ALTACE) 5 MG capsule Take 5 mg by mouth daily.  . Tamsulosin HCl (FLOMAX) 0.4 MG CAPS Take 0.4 mg by mouth daily.   . Thiamine HCl (VITAMIN B-1) 250 MG tablet Take 250 mg by mouth daily.  [name] torsemide (DEMADEX) 10 MG tablet TAKE 1 TABLET BY MOUTH TWICE DAILY  . vitamin B-12 (CYANOCOBALAMIN) 1000 MCG tablet Take 1,000 mcg by mouth daily.    [name] warfarin (COUMADIN) 5 MG tablet TAKE AS DIRECTED BY COUMADIN CLINIC   No facility-administered encounter medications on file as of 02/16/2016.  :  Review of Systems:  Out of a complete 14 point review of systems, all are reviewed and negative with the exception of these symptoms as listed below:  Review of Systems  Neurological:       Patient states that he is doing well with CPAP. Reports that he needs a new mask and supplies. Patient says that he does not feel any different using CPAP. He would like to know if it is making a difference for him.     Objective:  Neurologic Exam  Physical Exam Physical Examination:   Filed Vitals:   02/16/16 1144  BP: 160/68  Pulse: 62  Resp: 18    General Examination: The patient is a very pleasant 82 y.o. male in no acute distress. He appears well-developed and well-nourished and well groomed. He brought his cane.  HEENT: Normocephalic, atraumatic, pupils are equal, round and reactive to light and accommodation. Extraocular tracking is good without limitation to gaze  excursion or nystagmus noted. Normal smooth pursuit is noted. Hearing is mildly impaired. Face is symmetric with normal facial animation and normal facial sensation. Speech is clear with no dysarthria noted. There is no hypophonia. There is no lip, neck/head, jaw or voice tremor. Neck is supple with full range of passive and active motion. There are no carotid bruits on auscultation. Oropharynx exam reveals: moderate mouth dryness, adequate dental hygiene and moderate airway crowding, due to redundant soft palate and wider uvula. Tonsils are small. Mallampati is class II. Tongue protrudes centrally and palate elevates symmetrically.   Chest: Clear to auscultation without wheezing, rhonchi or crackles noted.  Heart: Irregular and he has a systolic murmur, unchanged.    Abdomen: Soft, non-tender and non-distended with normal bowel sounds appreciated on auscultation.  Extremities: There is no pitting edema in the distal lower extremities bilaterally.  Skin: Warm and dry without trophic changes noted. There are no varicose veins.  Musculoskeletal: exam reveals no obvious joint deformities, tenderness or joint swelling or erythema, with the exception of right knee pain, right knee appears slightly larger than left, possible mild bilateral bowleggedness noted, right more than left.   Neurologically:  Mental status: The patient is awake, alert and oriented in all 4 spheres. His immediate and remote  memory, attention, language skills and fund of knowledge are appropriate. There is no evidence of aphasia, agnosia, apraxia or anomia. Speech is clear with normal prosody and enunciation. Thought process is linear. Mood is normal and affect is normal.  Cranial nerves II - XII are as described above under HEENT exam. In addition: shoulder shrug is normal with equal shoulder height noted. Motor exam: Normal bulk, strength and tone is noted. There is no drift, tremor or rebound. Romberg is positive. Reflexes are 1  to 2+ throughout. Fine motor skills and coordination: slightly slow throughout.  Cerebellar testing: No dysmetria or intention tremor on finger to nose testing.  Sensory exam: intact to light touch in the upper and lower extremities.  Gait, station and balance: He stands with mild difficulty. No veering to one side is noted. His upper body is slightly tilted to the right. Posture is mildly stooped and may be age-appropriate and stance is  mildly wide-based. These are unchanged for the most part. He walks fairly well with a single prong cane on the right side. Slight limp noted on the right. Tandem walk is not possible.  Assessment and Plan:   In summary, [name] is a very pleasant 82 year old male with an underlying medical history of aortic stenosis, coronary artery disease, status post CABG and St. Jude aortic valve replacement in October 2003, chronic atrial fibrillation, hypertension, hyperlipidemia, T12 compression fracture with status post kyphoplasty, diverticulitis, renal cell mass and treatment resistant urinary incontinence, who presents for follow-up consultation of his gait and balance problems, history of falls and OSA, established on CPAP therapy. He is fully compliant with CPAP treatment and we talked about his split-night sleep study results from January 2016 again today.  he has not had any recent falls. Physical exam and neurological exam are stable with the exception of more knee pain reported and mild limp on the right noted. We talked about his most recent compliance data as well and he is commended on his treatment adherence. he is reminded to use his cane at all times. He is again advised to change positions slowly, drink enough water, and use his cane or 2 wheeled walker for safety. I will see him back in about 6 months, sooner if the need arises. I did not suggest any new medications at this time. he is advised to consider using Biotene for mouth dryness, he would like to run  this by his cardiologist first. From my end of things I can see him back in one year. I answered all his questions today and he was in agreement.  I spent 25 minutes in total face-to-face time with the patient, more than 50% of which was spent in counseling and coordination of care, reviewing test results, reviewing medication and discussing or reviewing the diagnosis of OSA and gait d/o, the prognosis and treatment options.

## 2016-03-09 ENCOUNTER — Ambulatory Visit (INDEPENDENT_AMBULATORY_CARE_PROVIDER_SITE_OTHER): Payer: PPO | Admitting: *Deleted

## 2016-03-09 DIAGNOSIS — Z954 Presence of other heart-valve replacement: Secondary | ICD-10-CM | POA: Diagnosis not present

## 2016-03-09 DIAGNOSIS — I4891 Unspecified atrial fibrillation: Secondary | ICD-10-CM | POA: Diagnosis not present

## 2016-03-09 DIAGNOSIS — I359 Nonrheumatic aortic valve disorder, unspecified: Secondary | ICD-10-CM

## 2016-03-09 DIAGNOSIS — Z7901 Long term (current) use of anticoagulants: Secondary | ICD-10-CM | POA: Diagnosis not present

## 2016-03-09 DIAGNOSIS — Z952 Presence of prosthetic heart valve: Secondary | ICD-10-CM

## 2016-03-09 DIAGNOSIS — Z5181 Encounter for therapeutic drug level monitoring: Secondary | ICD-10-CM

## 2016-03-09 LAB — POCT INR: INR: 2.5

## 2016-03-21 DIAGNOSIS — Z9181 History of falling: Secondary | ICD-10-CM | POA: Diagnosis not present

## 2016-03-21 DIAGNOSIS — I10 Essential (primary) hypertension: Secondary | ICD-10-CM | POA: Diagnosis not present

## 2016-04-19 ENCOUNTER — Ambulatory Visit (INDEPENDENT_AMBULATORY_CARE_PROVIDER_SITE_OTHER): Payer: PPO | Admitting: *Deleted

## 2016-04-19 DIAGNOSIS — Z952 Presence of prosthetic heart valve: Secondary | ICD-10-CM

## 2016-04-19 DIAGNOSIS — Z5181 Encounter for therapeutic drug level monitoring: Secondary | ICD-10-CM

## 2016-04-19 DIAGNOSIS — I4891 Unspecified atrial fibrillation: Secondary | ICD-10-CM | POA: Diagnosis not present

## 2016-04-19 DIAGNOSIS — Z7901 Long term (current) use of anticoagulants: Secondary | ICD-10-CM

## 2016-04-19 DIAGNOSIS — Z954 Presence of other heart-valve replacement: Secondary | ICD-10-CM | POA: Diagnosis not present

## 2016-04-19 DIAGNOSIS — I359 Nonrheumatic aortic valve disorder, unspecified: Secondary | ICD-10-CM

## 2016-04-19 LAB — POCT INR: INR: 1.2

## 2016-04-27 ENCOUNTER — Ambulatory Visit (INDEPENDENT_AMBULATORY_CARE_PROVIDER_SITE_OTHER): Payer: PPO | Admitting: Pharmacist

## 2016-04-27 ENCOUNTER — Encounter (INDEPENDENT_AMBULATORY_CARE_PROVIDER_SITE_OTHER): Payer: Self-pay

## 2016-04-27 DIAGNOSIS — Z5181 Encounter for therapeutic drug level monitoring: Secondary | ICD-10-CM

## 2016-04-27 DIAGNOSIS — Z954 Presence of other heart-valve replacement: Secondary | ICD-10-CM | POA: Diagnosis not present

## 2016-04-27 DIAGNOSIS — Z952 Presence of prosthetic heart valve: Secondary | ICD-10-CM

## 2016-04-27 DIAGNOSIS — Z7901 Long term (current) use of anticoagulants: Secondary | ICD-10-CM | POA: Diagnosis not present

## 2016-04-27 DIAGNOSIS — I4891 Unspecified atrial fibrillation: Secondary | ICD-10-CM | POA: Diagnosis not present

## 2016-04-27 DIAGNOSIS — I359 Nonrheumatic aortic valve disorder, unspecified: Secondary | ICD-10-CM | POA: Diagnosis not present

## 2016-04-27 LAB — POCT INR: INR: 1.9

## 2016-05-02 DIAGNOSIS — Z23 Encounter for immunization: Secondary | ICD-10-CM | POA: Diagnosis not present

## 2016-05-07 ENCOUNTER — Other Ambulatory Visit: Payer: Self-pay | Admitting: Cardiovascular Disease

## 2016-05-15 ENCOUNTER — Other Ambulatory Visit: Payer: Self-pay | Admitting: Cardiovascular Disease

## 2016-05-18 ENCOUNTER — Ambulatory Visit (INDEPENDENT_AMBULATORY_CARE_PROVIDER_SITE_OTHER): Payer: PPO | Admitting: *Deleted

## 2016-05-18 DIAGNOSIS — Z952 Presence of prosthetic heart valve: Secondary | ICD-10-CM

## 2016-05-18 DIAGNOSIS — Z7901 Long term (current) use of anticoagulants: Secondary | ICD-10-CM

## 2016-05-18 DIAGNOSIS — Z5181 Encounter for therapeutic drug level monitoring: Secondary | ICD-10-CM

## 2016-05-18 DIAGNOSIS — Z954 Presence of other heart-valve replacement: Secondary | ICD-10-CM

## 2016-05-18 DIAGNOSIS — I359 Nonrheumatic aortic valve disorder, unspecified: Secondary | ICD-10-CM

## 2016-05-18 DIAGNOSIS — I4891 Unspecified atrial fibrillation: Secondary | ICD-10-CM

## 2016-05-18 LAB — POCT INR: INR: 2.1

## 2016-05-22 ENCOUNTER — Telehealth: Payer: Self-pay | Admitting: Cardiovascular Disease

## 2016-05-22 NOTE — Telephone Encounter (Signed)
New message     1. What dental office are you calling from? Dr. Skip Mayer   2. What is your office phone and fax number? [phone] /  fax [phone]   3. What type of procedure is the patient having performed? Extraction    4. What date is procedure scheduled? In the office now sitting in chair   5. What is your question (ex. Antibiotics prior to procedure, holding medication-we need to know how long dentist wants pt to hold med)? Coumadin ?

## 2016-05-22 NOTE — Telephone Encounter (Signed)
Spoke with dentist office. Pt is in the chair and they did not realize he was on Coumadin. Confirmed that he is having a single dental extraction today. Per protocol, pt can remain on his Coumadin for extraction, especially with higher clot risk due to AVR if he were to hold Coumadin. INR was also checked on 9/22 and he was at the lower end of his range at 2.1. Clearance faxed to dentist office stating confirmation that pt can remain on Coumadin for single extraction today.

## 2016-06-14 ENCOUNTER — Ambulatory Visit (INDEPENDENT_AMBULATORY_CARE_PROVIDER_SITE_OTHER): Payer: PPO | Admitting: *Deleted

## 2016-06-14 DIAGNOSIS — I359 Nonrheumatic aortic valve disorder, unspecified: Secondary | ICD-10-CM | POA: Diagnosis not present

## 2016-06-14 DIAGNOSIS — Z7901 Long term (current) use of anticoagulants: Secondary | ICD-10-CM

## 2016-06-14 DIAGNOSIS — I4891 Unspecified atrial fibrillation: Secondary | ICD-10-CM

## 2016-06-14 DIAGNOSIS — Z5181 Encounter for therapeutic drug level monitoring: Secondary | ICD-10-CM

## 2016-06-14 DIAGNOSIS — Z952 Presence of prosthetic heart valve: Secondary | ICD-10-CM

## 2016-06-14 LAB — POCT INR: INR: 1.2

## 2016-06-21 ENCOUNTER — Encounter (INDEPENDENT_AMBULATORY_CARE_PROVIDER_SITE_OTHER): Payer: Self-pay

## 2016-06-21 ENCOUNTER — Ambulatory Visit (INDEPENDENT_AMBULATORY_CARE_PROVIDER_SITE_OTHER): Payer: PPO

## 2016-06-21 ENCOUNTER — Other Ambulatory Visit (HOSPITAL_COMMUNITY): Payer: Self-pay | Admitting: Interventional Radiology

## 2016-06-21 DIAGNOSIS — Z5181 Encounter for therapeutic drug level monitoring: Secondary | ICD-10-CM

## 2016-06-21 DIAGNOSIS — I359 Nonrheumatic aortic valve disorder, unspecified: Secondary | ICD-10-CM

## 2016-06-21 DIAGNOSIS — I4891 Unspecified atrial fibrillation: Secondary | ICD-10-CM | POA: Diagnosis not present

## 2016-06-21 DIAGNOSIS — Z7901 Long term (current) use of anticoagulants: Secondary | ICD-10-CM | POA: Diagnosis not present

## 2016-06-21 DIAGNOSIS — C641 Malignant neoplasm of right kidney, except renal pelvis: Secondary | ICD-10-CM

## 2016-06-21 DIAGNOSIS — Z952 Presence of prosthetic heart valve: Secondary | ICD-10-CM | POA: Diagnosis not present

## 2016-06-21 LAB — POCT INR: INR: 1.4

## 2016-06-28 DIAGNOSIS — G4733 Obstructive sleep apnea (adult) (pediatric): Secondary | ICD-10-CM | POA: Diagnosis not present

## 2016-07-03 ENCOUNTER — Ambulatory Visit (INDEPENDENT_AMBULATORY_CARE_PROVIDER_SITE_OTHER): Payer: PPO

## 2016-07-03 DIAGNOSIS — I359 Nonrheumatic aortic valve disorder, unspecified: Secondary | ICD-10-CM

## 2016-07-03 DIAGNOSIS — Z7901 Long term (current) use of anticoagulants: Secondary | ICD-10-CM | POA: Diagnosis not present

## 2016-07-03 DIAGNOSIS — I4891 Unspecified atrial fibrillation: Secondary | ICD-10-CM

## 2016-07-03 DIAGNOSIS — Z5181 Encounter for therapeutic drug level monitoring: Secondary | ICD-10-CM

## 2016-07-03 DIAGNOSIS — Z952 Presence of prosthetic heart valve: Secondary | ICD-10-CM | POA: Diagnosis not present

## 2016-07-03 LAB — POCT INR: INR: 3

## 2016-07-24 ENCOUNTER — Ambulatory Visit (INDEPENDENT_AMBULATORY_CARE_PROVIDER_SITE_OTHER): Payer: PPO | Admitting: Pharmacist

## 2016-07-24 DIAGNOSIS — I359 Nonrheumatic aortic valve disorder, unspecified: Secondary | ICD-10-CM | POA: Diagnosis not present

## 2016-07-24 DIAGNOSIS — Z952 Presence of prosthetic heart valve: Secondary | ICD-10-CM | POA: Diagnosis not present

## 2016-07-24 DIAGNOSIS — Z5181 Encounter for therapeutic drug level monitoring: Secondary | ICD-10-CM

## 2016-07-24 DIAGNOSIS — I4891 Unspecified atrial fibrillation: Secondary | ICD-10-CM

## 2016-07-24 DIAGNOSIS — Z7901 Long term (current) use of anticoagulants: Secondary | ICD-10-CM

## 2016-07-24 LAB — POCT INR: INR: 1.2

## 2016-07-27 DIAGNOSIS — R197 Diarrhea, unspecified: Secondary | ICD-10-CM | POA: Diagnosis not present

## 2016-07-27 DIAGNOSIS — R350 Frequency of micturition: Secondary | ICD-10-CM | POA: Diagnosis not present

## 2016-07-27 DIAGNOSIS — F411 Generalized anxiety disorder: Secondary | ICD-10-CM | POA: Diagnosis not present

## 2016-07-27 DIAGNOSIS — R030 Elevated blood-pressure reading, without diagnosis of hypertension: Secondary | ICD-10-CM | POA: Diagnosis not present

## 2016-07-31 ENCOUNTER — Ambulatory Visit (INDEPENDENT_AMBULATORY_CARE_PROVIDER_SITE_OTHER): Payer: PPO | Admitting: *Deleted

## 2016-07-31 DIAGNOSIS — Z5181 Encounter for therapeutic drug level monitoring: Secondary | ICD-10-CM

## 2016-07-31 DIAGNOSIS — I4891 Unspecified atrial fibrillation: Secondary | ICD-10-CM | POA: Diagnosis not present

## 2016-07-31 DIAGNOSIS — Z7901 Long term (current) use of anticoagulants: Secondary | ICD-10-CM | POA: Diagnosis not present

## 2016-07-31 DIAGNOSIS — I359 Nonrheumatic aortic valve disorder, unspecified: Secondary | ICD-10-CM

## 2016-07-31 DIAGNOSIS — Z952 Presence of prosthetic heart valve: Secondary | ICD-10-CM

## 2016-07-31 LAB — POCT INR: INR: 2.1

## 2016-08-06 ENCOUNTER — Other Ambulatory Visit: Payer: Self-pay | Admitting: *Deleted

## 2016-08-06 ENCOUNTER — Telehealth: Payer: Self-pay | Admitting: *Deleted

## 2016-08-06 MED ORDER — CARVEDILOL 25 MG PO TABS: ORAL_TABLET | ORAL | 2 refills | Status: DC

## 2016-08-06 NOTE — Telephone Encounter (Signed)
Patients daughter, [name] called and she is questioning patients torsemide. She stated that the bottle has that he is to take 10 mg bid, but the patient has crossed this out and wrote once a day on it. [name] can be reached at [phone]. Please advise. Thanks, MI

## 2016-08-06 NOTE — Telephone Encounter (Signed)
Informed patient's daughter (DPR) that patient is suppose to be taking torsemide 10 mg twice daily according to chart. Informed patient's daughter to talk with patient's PCP or urologist to see if they have instructed patient to take only once daily. Patient has been taking once daily, and did not report any swelling in BLE or SOB. Again, encouraged patient's daughter to call patient's PCP and Urologist. Patient's daughter verbalized understanding.

## 2016-08-10 DIAGNOSIS — F411 Generalized anxiety disorder: Secondary | ICD-10-CM | POA: Diagnosis not present

## 2016-08-10 DIAGNOSIS — I2510 Atherosclerotic heart disease of native coronary artery without angina pectoris: Secondary | ICD-10-CM | POA: Diagnosis not present

## 2016-08-10 DIAGNOSIS — I480 Paroxysmal atrial fibrillation: Secondary | ICD-10-CM | POA: Diagnosis not present

## 2016-08-10 DIAGNOSIS — R7301 Impaired fasting glucose: Secondary | ICD-10-CM | POA: Diagnosis not present

## 2016-08-10 DIAGNOSIS — E785 Hyperlipidemia, unspecified: Secondary | ICD-10-CM | POA: Diagnosis not present

## 2016-08-10 DIAGNOSIS — R197 Diarrhea, unspecified: Secondary | ICD-10-CM | POA: Diagnosis not present

## 2016-08-10 DIAGNOSIS — I10 Essential (primary) hypertension: Secondary | ICD-10-CM | POA: Diagnosis not present

## 2016-08-10 DIAGNOSIS — Z87898 Personal history of other specified conditions: Secondary | ICD-10-CM | POA: Diagnosis not present

## 2016-08-10 DIAGNOSIS — R410 Disorientation, unspecified: Secondary | ICD-10-CM | POA: Diagnosis not present

## 2016-08-10 DIAGNOSIS — E559 Vitamin D deficiency, unspecified: Secondary | ICD-10-CM | POA: Diagnosis not present

## 2016-08-10 DIAGNOSIS — Z5181 Encounter for therapeutic drug level monitoring: Secondary | ICD-10-CM | POA: Diagnosis not present

## 2016-08-10 DIAGNOSIS — I5032 Chronic diastolic (congestive) heart failure: Secondary | ICD-10-CM | POA: Diagnosis not present

## 2016-08-13 DIAGNOSIS — I5032 Chronic diastolic (congestive) heart failure: Secondary | ICD-10-CM | POA: Diagnosis not present

## 2016-08-13 DIAGNOSIS — R7301 Impaired fasting glucose: Secondary | ICD-10-CM | POA: Diagnosis not present

## 2016-08-13 DIAGNOSIS — E559 Vitamin D deficiency, unspecified: Secondary | ICD-10-CM | POA: Diagnosis not present

## 2016-08-13 DIAGNOSIS — I480 Paroxysmal atrial fibrillation: Secondary | ICD-10-CM | POA: Diagnosis not present

## 2016-08-13 DIAGNOSIS — G4733 Obstructive sleep apnea (adult) (pediatric): Secondary | ICD-10-CM | POA: Diagnosis not present

## 2016-08-13 DIAGNOSIS — F411 Generalized anxiety disorder: Secondary | ICD-10-CM | POA: Diagnosis not present

## 2016-08-13 DIAGNOSIS — R350 Frequency of micturition: Secondary | ICD-10-CM | POA: Diagnosis not present

## 2016-08-13 DIAGNOSIS — R410 Disorientation, unspecified: Secondary | ICD-10-CM | POA: Diagnosis not present

## 2016-08-13 DIAGNOSIS — I10 Essential (primary) hypertension: Secondary | ICD-10-CM | POA: Diagnosis not present

## 2016-08-13 DIAGNOSIS — I2510 Atherosclerotic heart disease of native coronary artery without angina pectoris: Secondary | ICD-10-CM | POA: Diagnosis not present

## 2016-08-13 DIAGNOSIS — E785 Hyperlipidemia, unspecified: Secondary | ICD-10-CM | POA: Diagnosis not present

## 2016-08-22 ENCOUNTER — Emergency Department (HOSPITAL_COMMUNITY)
Admission: EM | Admit: 2016-08-22 | Discharge: 2016-08-23 | Disposition: A | Discharge status: Home / Self Care | Payer: PPO | Attending: Emergency Medicine | Admitting: Emergency Medicine

## 2016-08-22 ENCOUNTER — Encounter (HOSPITAL_COMMUNITY): Payer: Self-pay

## 2016-08-22 ENCOUNTER — Emergency Department (HOSPITAL_COMMUNITY): Payer: PPO

## 2016-08-22 DIAGNOSIS — Z9049 Acquired absence of other specified parts of digestive tract: Secondary | ICD-10-CM | POA: Diagnosis not present

## 2016-08-22 DIAGNOSIS — I110 Hypertensive heart disease with heart failure: Secondary | ICD-10-CM | POA: Diagnosis not present

## 2016-08-22 DIAGNOSIS — F0391 Unspecified dementia with behavioral disturbance: Secondary | ICD-10-CM | POA: Diagnosis not present

## 2016-08-22 DIAGNOSIS — Z8679 Personal history of other diseases of the circulatory system: Secondary | ICD-10-CM

## 2016-08-22 DIAGNOSIS — Z9889 Other specified postprocedural states: Secondary | ICD-10-CM | POA: Diagnosis not present

## 2016-08-22 DIAGNOSIS — Z79899 Other long term (current) drug therapy: Secondary | ICD-10-CM | POA: Diagnosis not present

## 2016-08-22 DIAGNOSIS — Z01818 Encounter for other preprocedural examination: Secondary | ICD-10-CM | POA: Diagnosis not present

## 2016-08-22 DIAGNOSIS — Z7982 Long term (current) use of aspirin: Secondary | ICD-10-CM | POA: Insufficient documentation

## 2016-08-22 DIAGNOSIS — Z85528 Personal history of other malignant neoplasm of kidney: Secondary | ICD-10-CM | POA: Diagnosis not present

## 2016-08-22 DIAGNOSIS — Z87891 Personal history of nicotine dependence: Secondary | ICD-10-CM | POA: Diagnosis not present

## 2016-08-22 DIAGNOSIS — Z951 Presence of aortocoronary bypass graft: Secondary | ICD-10-CM | POA: Diagnosis not present

## 2016-08-22 DIAGNOSIS — Z7901 Long term (current) use of anticoagulants: Secondary | ICD-10-CM | POA: Diagnosis not present

## 2016-08-22 DIAGNOSIS — I2510 Atherosclerotic heart disease of native coronary artery without angina pectoris: Secondary | ICD-10-CM | POA: Diagnosis not present

## 2016-08-22 DIAGNOSIS — Z8249 Family history of ischemic heart disease and other diseases of the circulatory system: Secondary | ICD-10-CM | POA: Diagnosis not present

## 2016-08-22 DIAGNOSIS — R402411 Glasgow coma scale score 13-15, in the field [EMT or ambulance]: Secondary | ICD-10-CM | POA: Diagnosis not present

## 2016-08-22 DIAGNOSIS — R4182 Altered mental status, unspecified: Secondary | ICD-10-CM | POA: Diagnosis not present

## 2016-08-22 DIAGNOSIS — R410 Disorientation, unspecified: Secondary | ICD-10-CM | POA: Diagnosis not present

## 2016-08-22 DIAGNOSIS — F03918 Unspecified dementia, unspecified severity, with other behavioral disturbance: Secondary | ICD-10-CM | POA: Diagnosis present

## 2016-08-22 DIAGNOSIS — Z952 Presence of prosthetic heart valve: Secondary | ICD-10-CM

## 2016-08-22 DIAGNOSIS — I5032 Chronic diastolic (congestive) heart failure: Secondary | ICD-10-CM | POA: Insufficient documentation

## 2016-08-22 LAB — URINALYSIS, ROUTINE W REFLEX MICROSCOPIC
Bilirubin Urine: NEGATIVE
Glucose, UA: NEGATIVE mg/dL
HGB URINE DIPSTICK: NEGATIVE
Ketones, ur: NEGATIVE mg/dL
LEUKOCYTES UA: NEGATIVE
NITRITE: NEGATIVE
Protein, ur: NEGATIVE mg/dL
SPECIFIC GRAVITY, URINE: 1.008 (ref 1.005–1.030)
pH: 9 — ABNORMAL HIGH (ref 5.0–8.0)

## 2016-08-22 LAB — CBC
HEMATOCRIT: 32.7 % — AB (ref 39.0–52.0)
Hemoglobin: 11.2 g/dL — ABNORMAL LOW (ref 13.0–17.0)
MCH: 28.2 pg (ref 26.0–34.0)
MCHC: 34.3 g/dL (ref 30.0–36.0)
MCV: 82.4 fL (ref 78.0–100.0)
Platelets: 205 10*3/uL (ref 150–400)
RBC: 3.97 MIL/uL — ABNORMAL LOW (ref 4.22–5.81)
RDW: 12.1 % (ref 11.5–15.5)
WBC: 5.8 10*3/uL (ref 4.0–10.5)

## 2016-08-22 LAB — BASIC METABOLIC PANEL
ANION GAP: 12 (ref 5–15)
BUN: 22 mg/dL — ABNORMAL HIGH (ref 6–20)
CALCIUM: 10 mg/dL (ref 8.9–10.3)
CHLORIDE: 97 mmol/L — AB (ref 101–111)
CO2: 25 mmol/L (ref 22–32)
Creatinine, Ser: 0.84 mg/dL (ref 0.61–1.24)
GFR calc Af Amer: >60 mL/min (ref >60)
GFR calc non Af Amer: >60 mL/min (ref >60)
GLUCOSE: 99 mg/dL (ref 65–99)
Potassium: 3.2 mmol/L — ABNORMAL LOW (ref 3.5–5.1)
Sodium: 134 mmol/L — ABNORMAL LOW (ref 135–145)

## 2016-08-22 LAB — PROTIME-INR
INR: 2.39
Prothrombin Time: 26.5 seconds — ABNORMAL HIGH (ref 11.4–15.2)

## 2016-08-22 LAB — DIGOXIN LEVEL: Digoxin Level: 0.2 ng/mL — ABNORMAL LOW (ref 0.8–2.0)

## 2016-08-22 MED ORDER — RAMIPRIL 10 MG PO CAPS
10.0000 mg | ORAL_CAPSULE | Freq: Two times a day (BID) | ORAL | Status: DC
  Administered 2016-08-22 – 2016-08-23 (×2): 10 mg via ORAL
  Filled 2016-08-22 (×3): qty 1

## 2016-08-22 MED ORDER — WARFARIN SODIUM 5 MG PO TABS: 5.0000 mg | ORAL_TABLET | Freq: Every day | ORAL | Status: DC

## 2016-08-22 MED ORDER — OXYBUTYNIN CHLORIDE ER 10 MG PO TB24
10.0000 mg | ORAL_TABLET | ORAL | Status: DC
  Administered 2016-08-23: 10 mg via ORAL
  Filled 2016-08-22: qty 1

## 2016-08-22 MED ORDER — TORSEMIDE 10 MG PO TABS
10.0000 mg | ORAL_TABLET | Freq: Every day | ORAL | Status: DC
  Administered 2016-08-23: 10 mg via ORAL
  Filled 2016-08-22: qty 1

## 2016-08-22 MED ORDER — CARVEDILOL 12.5 MG PO TABS
12.5000 mg | ORAL_TABLET | Freq: Two times a day (BID) | ORAL | Status: DC
  Administered 2016-08-22 – 2016-08-23 (×2): 12.5 mg via ORAL
  Filled 2016-08-22 (×3): qty 1

## 2016-08-22 MED ORDER — ATORVASTATIN CALCIUM 40 MG PO TABS
40.0000 mg | ORAL_TABLET | Freq: Every day | ORAL | Status: DC
  Administered 2016-08-23: 40 mg via ORAL
  Filled 2016-08-22: qty 1

## 2016-08-22 MED ORDER — ESCITALOPRAM OXALATE 10 MG PO TABS
10.0000 mg | ORAL_TABLET | Freq: Every day | ORAL | Status: DC
  Administered 2016-08-22 – 2016-08-23 (×2): 10 mg via ORAL
  Filled 2016-08-22 (×2): qty 1

## 2016-08-22 MED ORDER — ASPIRIN EC 81 MG PO TBEC
81.0000 mg | DELAYED_RELEASE_TABLET | Freq: Every day | ORAL | Status: DC
  Administered 2016-08-22 – 2016-08-23 (×2): 81 mg via ORAL
  Filled 2016-08-22 (×2): qty 1

## 2016-08-22 MED ORDER — POTASSIUM CHLORIDE CRYS ER 20 MEQ PO TBCR
40.0000 meq | EXTENDED_RELEASE_TABLET | Freq: Once | ORAL | Status: AC
  Administered 2016-08-22: 40 meq via ORAL
  Filled 2016-08-22: qty 2

## 2016-08-22 MED ORDER — DIGOXIN 125 MCG PO TABS
125.0000 ug | ORAL_TABLET | Freq: Every day | ORAL | Status: DC
  Administered 2016-08-22 – 2016-08-23 (×2): 125 ug via ORAL
  Filled 2016-08-22 (×2): qty 1

## 2016-08-22 MED ORDER — TAMSULOSIN HCL 0.4 MG PO CAPS
0.4000 mg | ORAL_CAPSULE | Freq: Every day | ORAL | Status: DC
  Administered 2016-08-22 – 2016-08-23 (×2): 0.4 mg via ORAL
  Filled 2016-08-22 (×2): qty 1

## 2016-08-22 MED ORDER — WARFARIN SODIUM 7.5 MG PO TABS
7.5000 mg | ORAL_TABLET | Freq: Once | ORAL | Status: AC
  Administered 2016-08-22: 7.5 mg via ORAL
  Filled 2016-08-22: qty 1

## 2016-08-22 MED ORDER — WARFARIN - PHARMACIST DOSING INPATIENT
Freq: Every day | Status: DC
  Administered 2016-08-22: 17:00:00

## 2016-08-22 NOTE — ED Notes (Signed)
Bed: WA17 Expected date:  Expected time:  Means of arrival:  Comments: EMS 

## 2016-08-22 NOTE — ED Notes (Signed)
He is moved to TCU at this time; and report is given to [place], Therapist, sports and she assumes his care.

## 2016-08-22 NOTE — Progress Notes (Signed)
ANTICOAGULATION CONSULT NOTE - Initial Consult  Pharmacy Consult for warfarin Indication: atrial fibrillation, St Jude AVR  No Known Allergies  Patient Measurements:   Heparin Dosing Weight:   Vital Signs: Temp: 97.7 F (36.5 C) (12/27 1439) BP: 176/97 (12/27 1439) Pulse Rate: 53 (12/27 1439)  Labs:  Recent Labs  08/22/16 1456  HGB 11.2*  HCT 32.7*  PLT 205  LABPROT 26.5*  INR 2.39  CREATININE 0.84    CrCl cannot be calculated (Unknown ideal weight.).   Medical History: Past Medical History:  Diagnosis Date  . Aortic valve replaced   . Arthritis   . Atrial fibrillation ([place])    longterm persistent  . Back pain, chronic    SINCE BACK INJURY / FRACTURE  . BPH (benign prostatic hyperplasia)    grapey  . CAD (coronary artery disease)   . Cancer (HCC)    RIGHT RENAL  . Constipation   . Cough 06/16/12   C/O OF COUGH / COLD FOR A COUPLE OF WEEKS  . Diverticulitis    history  . Elevated fasting glucose 2012  . GERD (gastroesophageal reflux disease)   . HTN (hypertension)   . Hyperlipidemia   . Hypokalemia   . Kidney disorder    ablation defect in the right kidney lower pole but progression of interpolar lesion: stable on repeat CT (06/2014)  . Right renal mass    renal cell carcinoma  . Shortness of breath    PT STATES RELATED TO HIS AF AND HEART BEING OUT OF RHYTHM  . Stomach ulcer     Assessment: 52 YOM presents to ED with mental status changes.  He is on chronic warfarin for afib and St. Jude AVR.  Pharmacy asked to dose warfarin while in hospital.  Home regimen:  Warfarin 7.5mg  MWFSa, 10mg  TTSu (LD 12/26)  Today, 08/22/2016:  INR = 2.39  CBC: Hgb = 11.2, pltc WNL  Goal of Therapy:  INR 2-3  - Typical goal is 2.5-3.5 for mechanical AVR + afib but per outpatient coumadin clinic visits, goal is stated as 2-3   Plan:   Warfarin 7.5mg  PO x 1 tonight as per home regimen  Daily INR  Doreene Eland, PharmD, BCPS.   Pager:  [phone] 08/22/2016 5:08 PM

## 2016-08-22 NOTE — ED Notes (Signed)
Pt's son, [name] can be reached on his home phone number [phone]. If unable to contact the son, pt's daughter, [name] can be reached at [phone].   Password:"[email or web address]"

## 2016-08-22 NOTE — ED Notes (Signed)
Bed: [id number] Expected date: 08/21/16 Expected time:  Means of arrival:  Comments: Hold for room 17

## 2016-08-22 NOTE — BH Assessment (Addendum)
Tele Assessment Note   [name] is an 82 y.o. male, who presents voluntarily and accompanied by his daughter in-law to [facility] Pt reported, "I have no complaints, this was a good experience." Pt reported, he was on the third floor and his wife was sick. [name] in-law reported, the pt's wife is not sick and the pt was in the ED on the first floor. [name] in-law reported, about a month ago, the pt began having bouts of confusion, disoriented, becoming easily frustrated with his wife (yelling and cursing at her), saying things that do not make sense. [name] in-law reported, the pt reported, to his son "she wont give me the number,"  while looking through a blank book. [name] in-law reported, the bouts of confusion are occurring more frequently and the pt's wife tends to call her kids to defuse the situation. [name] in-law reported, they go to the pt's house everyday to defuse his anger, frustration and confusion. [name] in-law reported, the pt takes two days of mediation in one day and at times he takes his wife's medication. [name] in-law reported, the pt accused his wife of taking his medication. Pt denies SI, HI, AVH and self-injurious behaviors.   Pt denied verbal, physical and sexual abuse. Pt denied substance usage. [name] in-law reported, the pt was prescribed Lexapro for his anxiety/nervousness. Pt denied previous inpatient admissions. Pt reported, he is not linked to a counselor nor psychiatrist.   Pt presented alert in scrubs with logical/coherent speech. Pt's eye contact was good. Pt's mood was pleasant. Pt's affect was pleasant. Pt's thought process was irrelevant at times. Pt's judgement was partial. Pt's concentration and impulse control  was fair. Pt's insight was poor. Pt was oriented x2 (city and state). Pt reported, of discharged from [facility] he could contract for safety. Pt reported, if inpatient treatment is recommended he will sign in  voluntarily.   Diagnosis: Unspecified Anxiety disorder                   Dementia (R/O)  Past Medical History:  Past Medical History:  Diagnosis Date  . Aortic valve replaced   . Arthritis   . Atrial fibrillation ([place])    longterm persistent  . Back pain, chronic    SINCE BACK INJURY / FRACTURE  . BPH (benign prostatic hyperplasia)    grapey  . CAD (coronary artery disease)   . Cancer (HCC)    RIGHT RENAL  . Constipation   . Cough 06/16/12   C/O OF COUGH / COLD FOR A COUPLE OF WEEKS  . Diverticulitis    history  . Elevated fasting glucose 2012  . GERD (gastroesophageal reflux disease)   . HTN (hypertension)   . Hyperlipidemia   . Hypokalemia   . Kidney disorder    ablation defect in the right kidney lower pole but progression of interpolar lesion: stable on repeat CT (06/2014)  . Right renal mass    renal cell carcinoma  . Shortness of breath    PT STATES RELATED TO HIS AF AND HEART BEING OUT OF RHYTHM  . Stomach ulcer     Past Surgical History:  Procedure Laterality Date  . AORTIC VALVE REPLACEMENT    . CHOLECYSTECTOMY N/A 09/08/2013   Procedure: LAPAROSCOPIC CHOLECYSTECTOMY WITH INTRAOPERATIVE CHOLANGIOGRAM;  Surgeon: Odis Hollingshead, MD;  Location: [facility] ORS;  Service: General;  Laterality: N/A;  . CORONARY ARTERY BYPASS GRAFT    . KNEE SURGERY    .  LEFT HEART CATHETERIZATION WITH CORONARY/GRAFT ANGIOGRAM N/A 09/07/2013   Procedure: LEFT HEART CATHETERIZATION WITH Beatrix Fetters;  Surgeon: Sinclair Grooms, MD;  Location: [facility] CATH LAB;  Service: Cardiovascular;  Laterality: N/A;  . RADIOFREQUENCY ABLATION KIDNEY     RIGHT (two surgeries)  . Thoracic T12 compression fracture      Family History:  Family History  Problem Relation Age of Onset  . Hypertension Mother   . Diabetes Mother   . Hypertension Sister   . Diabetes Sister   . Hypertension Brother   . Diabetes Brother     Social History:  reports that he quit smoking about 39 years ago. His  smoking use included Cigarettes. He has a 40.00 pack-year smoking history. He has never used smokeless tobacco. He reports that he does not drink alcohol or use drugs.  Additional Social History:  Alcohol / Drug Use Pain Medications: See MAR Prescriptions: See MAR Over the Counter: See MAR History of alcohol / drug use?: No history of alcohol / drug abuse  CIWA: CIWA-Ar BP: 140/62 Pulse Rate: 62 COWS:    PATIENT STRENGTHS: (choose at least two) Average or above average intelligence Supportive family/friends  Allergies: No Known Allergies  Home Medications:  (Not in a hospital admission)  OB/GYN Status:  No LMP for male patient.  General Assessment Data Location of Assessment: [facility] ED TTS Assessment: In system Is this a Tele or Face-to-Face Assessment?: Face-to-Face Is this an Initial Assessment or a Re-assessment for this encounter?: Initial Assessment Marital status: Married [place] name: NA Is patient pregnant?: No Pregnancy Status: No Living Arrangements: Spouse/significant other Can pt return to current living arrangement?: Yes Admission Status: Voluntary Is patient capable of signing voluntary admission?: Yes Referral Source: Self/Family/Friend Insurance type: [employer]     Crisis Care Plan Living Arrangements: Spouse/significant other Legal Guardian: Other: (Self) Name of Psychiatrist: Na Name of Therapist: NA  Education Status Is patient currently in school?: No Current Grade: NA Highest grade of school patient has completed: 12th grade Name of school: NA Contact person: NA  Risk to self with the past 6 months Suicidal Ideation: No Has patient been a risk to self within the past 6 months prior to admission? : No Suicidal Intent: No Has patient had any suicidal intent within the past 6 months prior to admission? : No Is patient at risk for suicide?: No Suicidal Plan?: No Has patient had any suicidal plan within the past 6 months prior to  admission? : No Access to Means: No What has been your use of drugs/alcohol within the last 12 months?: NA Previous Attempts/Gestures: No How many times?: 0 Other Self Harm Risks: NA Triggers for Past Attempts: None known Intentional Self Injurious Behavior: None (Pt denies. ) Family Suicide History: Unable to assess Persecutory voices/beliefs?: No Depression: Yes Depression Symptoms: Feeling angry/irritable Substance abuse history and/or treatment for substance abuse?: No Suicide prevention information given to non-admitted patients: Not applicable  Risk to Others within the past 6 months Homicidal Ideation: No (Pt denies.) Does patient have any lifetime risk of violence toward others beyond the six months prior to admission? : No Thoughts of Harm to Others: No Current Homicidal Intent: No Current Homicidal Plan: No Access to Homicidal Means: No Identified Victim: NA History of harm to others?: No Assessment of Violence: None Noted Violent Behavior Description: NA Does patient have access to weapons?: No Criminal Charges Pending?: No Does patient have a court date: No Is patient on probation?: No  Psychosis  Hallucinations: None noted Delusions: None noted  Mental Status Report Appearance/Hygiene: In scrubs Eye Contact: Good Motor Activity: Unremarkable Speech: Logical/coherent Level of Consciousness: Alert Mood: Pleasant Affect: Other (Comment) (pleasant) Anxiety Level: None Thought Processes: Irrelevant Judgement: Partial Orientation: Other (Comment) (city and state) Obsessive Compulsive Thoughts/Behaviors: Unable to Assess  Cognitive Functioning Concentration: Fair Memory: Recent Impaired IQ: Average Insight: Poor Impulse Control: Fair Appetite: Good Weight Loss: 0 Weight Gain: 0 Sleep: Increased Total Hours of Sleep:  (Per, pt's daughter in-law pt's sleep is up and down. ) Vegetative Symptoms: None  ADLScreening [facility] Assessment Services) Patient's  cognitive ability adequate to safely complete daily activities?: Yes Patient able to express need for assistance with ADLs?: Yes Independently performs ADLs?: Yes (appropriate for developmental age)  Prior Inpatient Therapy Prior Inpatient Therapy: No Prior Therapy Dates: NA Prior Therapy Facilty/Provider(s): NA Reason for Treatment: NA  Prior Outpatient Therapy Prior Outpatient Therapy: No Prior Therapy Dates: NA Prior Therapy Facilty/Provider(s): NA Reason for Treatment: NA Does patient have an ACCT team?: No Does patient have Intensive In-House Services?  : No Does patient have [facility] services? : No Does patient have P4CC services?: No  ADL Screening (condition at time of admission) Patient's cognitive ability adequate to safely complete daily activities?: Yes Is the patient deaf or have difficulty hearing?: Yes Does the patient have difficulty seeing, even when wearing glasses/contacts?: Yes (Pt reported wearing reading glasses. ) Does the patient have difficulty concentrating, remembering, or making decisions?: Yes (Pt reported, difficulty concentrating. ) Patient able to express need for assistance with ADLs?: Yes Does the patient have difficulty dressing or bathing?: No Independently performs ADLs?: Yes (appropriate for developmental age) Does the patient have difficulty walking or climbing stairs?: No Weakness of Legs: None Weakness of Arms/Hands: None       Abuse/Neglect Assessment (Assessment to be complete while patient is alone) Physical Abuse: Denies (Pt denies.) Verbal Abuse: Denies (Pt denies. ) Sexual Abuse: Denies (Pt denies. )     Advance Directives (For Healthcare) Does Patient Have a Medical Advance Directive?: No Would patient like information on creating a medical advance directive?: No - Patient declined    Additional Information 1:1 In Past 12 Months?: No CIRT Risk: No Elopement Risk: No Does patient have medical clearance?: Yes      Disposition: Patriciaann Clan, PA recommends geropsych inpatient. Disposition discussed with Dr. Ashok Cordia and Larene Beach, RN.       Disposition Initial Assessment Completed for this Encounter: Yes Disposition of Patient: Inpatient treatment program Type of inpatient treatment program: Adult  Edd Fabian 08/22/2016 9:35 PM   Edd Fabian, MS, [facility], [facility] Triage Specialist [phone]

## 2016-08-22 NOTE — ED Triage Notes (Signed)
His family report that for ~ 1 month, pt. Has exhibited confused behaviors, especially at night. He also has exhibited rare aggressive behavior toward his spouse. He has been seen a few times recently by his pcp; who has performed blood testing, which was "normal"; and prescribed Lexapro. He arrives here awake, alert and in no distress. He is a bit exuberantly emotional, but is redirectable. He is disoriented to day/date/time, but is oriented to his situation and is able to tell us his name and follow commands with all extremities. His speech is clear.

## 2016-08-22 NOTE — BHH Counselor (Signed)
Pt has been referred to the following inpt facilities for gero-psych placement: [facility], [facility], [place], [facility].  Lind Covert, MSW, [facility]

## 2016-08-22 NOTE — ED Provider Notes (Addendum)
[facility] DEPT Provider Note   CSN: [id number] Arrival date & time: 08/22/16  1424     History   Chief Complaint Chief Complaint  Patient presents with  . Altered Mental Status    HPI [name] is a 82 y.o. male.  Patient with hx afib, AVR, on chr coumadin therapy, noted by family to have increased confused, disorientation, and episodic agitated/aggressive behavior over the course of the past 1-2 months. No preceding hx dementia. Patient w very little insight into current symptoms, states feels fine, and nothing is wrong - level 5 caveat.  Family not currently here for additional information. No report of trauma or fall. No fevers.  Report of recently seeing pcp for same, and started on lexapro.     The history is provided by the patient. The history is limited by the condition of the patient.  Altered Mental Status   Associated symptoms include confusion. Pertinent negatives include no weakness.    Past Medical History:  Diagnosis Date  . Aortic valve replaced   . Arthritis   . Atrial fibrillation ([place])    longterm persistent  . Back pain, chronic    SINCE BACK INJURY / FRACTURE  . BPH (benign prostatic hyperplasia)    grapey  . CAD (coronary artery disease)   . Cancer (HCC)    RIGHT RENAL  . Constipation   . Cough 06/16/12   C/O OF COUGH / COLD FOR A COUPLE OF WEEKS  . Diverticulitis    history  . Elevated fasting glucose 2012  . GERD (gastroesophageal reflux disease)   . HTN (hypertension)   . Hyperlipidemia   . Hypokalemia   . Kidney disorder    ablation defect in the right kidney lower pole but progression of interpolar lesion: stable on repeat CT (06/2014)  . Right renal mass    renal cell carcinoma  . Shortness of breath    PT STATES RELATED TO HIS AF AND HEART BEING OUT OF RHYTHM  . Stomach ulcer     Patient Active Problem List   Diagnosis Date Noted  . Right renal mass   . Renal carcinoma ([place])   . Spastic bladder 10/05/2014  .  Encounter for therapeutic drug monitoring 11/06/2013  . Acute NSTEMI (non-ST elevated myocardial infarction) 09/26/2013  . Chronic calculous cholecystitis 09/08/2013  . Acalculous cholecystitis 09/04/2013  . Abdominal pain, other specified site 09/01/2013  . Constipation 09/01/2013  . Hypotension 09/01/2013  . Leukocytosis 09/01/2013  . Normocytic anemia 09/01/2013  . Hyponatremia 09/01/2013  . Bruit 04/21/2013  . Long term (current) use of anticoagulants 11/22/2010  . Chronic diastolic heart failure ([place]) 11/03/2010  . Aortic valve disorder 10/23/2010  . EDEMA 04/26/2010  . DYSPNEA 04/26/2010  . CHEST PAIN 04/26/2010  . Elevated lipids 01/21/2009  . HYPOKALEMIA 01/21/2009  . Essential hypertension 01/21/2009  . Coronary atherosclerosis 01/21/2009  . Atrial fibrillation ([place]) 01/21/2009  . ALTERED MENTAL STATUS 01/21/2009  . AORTIC VALVE REPLACEMENT, HX OF 01/21/2009    Past Surgical History:  Procedure Laterality Date  . AORTIC VALVE REPLACEMENT    . CHOLECYSTECTOMY N/A 09/08/2013   Procedure: LAPAROSCOPIC CHOLECYSTECTOMY WITH INTRAOPERATIVE CHOLANGIOGRAM;  Surgeon: Odis Hollingshead, MD;  Location: [facility] ORS;  Service: General;  Laterality: N/A;  . CORONARY ARTERY BYPASS GRAFT    . KNEE SURGERY    . LEFT HEART CATHETERIZATION WITH CORONARY/GRAFT ANGIOGRAM N/A 09/07/2013   Procedure: LEFT HEART CATHETERIZATION WITH Beatrix Fetters;  Surgeon: Sinclair Grooms, MD;  Location:  [facility] CATH LAB;  Service: Cardiovascular;  Laterality: N/A;  . RADIOFREQUENCY ABLATION KIDNEY     RIGHT (two surgeries)  . Thoracic T12 compression fracture         Home Medications    Prior to Admission medications   Medication Sig Start Date End Date Taking? Authorizing Provider  aspirin EC 81 MG EC tablet Take 1 tablet (81 mg total) by mouth daily. 09/11/13   Janece Canterbury, MD  atorvastatin (LIPITOR) 40 MG tablet Take 40 mg by mouth daily. 06/14/15   Historical Provider, MD  carvedilol  (COREG) 25 MG tablet TAKE 1 TABLET BY MOUTH TWICE DAILY WITH A MEAL 08/06/16   Josue Hector, MD  cholecalciferol (VITAMIN D) 1000 UNITS tablet Take 1,000 Units by mouth daily.    Historical Provider, MD  digoxin (DIGOX) 0.125 MG tablet Take 1 tablet (125 mcg total) by mouth daily. 05/16/16   Josue Hector, MD  famotidine (PEPCID) 20 MG tablet Take 20 mg by mouth 2 (two) times daily.    Historical Provider, MD  Multiple Vitamin (MULTIVITAMIN WITH MINERALS) TABS tablet Take 1 tablet by mouth daily.    Historical Provider, MD  nitroGLYCERIN (NITROSTAT) 0.4 MG SL tablet Place 1 tablet (0.4 mg total) under the tongue every 5 (five) minutes as needed. For chest pain 10/20/15   Josue Hector, MD  oxybutynin (DITROPAN-XL) 10 MG 24 hr tablet Take 10 mg by mouth at bedtime.    Historical Provider, MD  potassium chloride (KLOR-CON) 8 MEQ tablet TAKE 1 TABLET BY MOUTH EVERY DAY 12/06/15   Josue Hector, MD  potassium chloride (KLOR-CON) 8 MEQ tablet TAKE 1 TABLET(8 MEQ) BY MOUTH DAILY 01/02/16   Josue Hector, MD  ramipril (ALTACE) 5 MG capsule Take 5 mg by mouth daily.    Historical Provider, MD  Tamsulosin HCl (FLOMAX) 0.4 MG CAPS Take 0.4 mg by mouth daily.     Historical Provider, MD  Thiamine HCl (VITAMIN B-1) 250 MG tablet Take 250 mg by mouth daily.    Historical Provider, MD  torsemide (DEMADEX) 10 MG tablet TAKE 1 TABLET BY MOUTH TWICE DAILY 12/05/15   Josue Hector, MD  vitamin B-12 (CYANOCOBALAMIN) 1000 MCG tablet Take 1,000 mcg by mouth daily.      Historical Provider, MD  warfarin (COUMADIN) 5 MG tablet TAKE AS DIRECTED BY COUMADIN CLINIC 05/07/16   Josue Hector, MD    Family History Family History  Problem Relation Age of Onset  . Hypertension Mother   . Diabetes Mother   . Hypertension Sister   . Diabetes Sister   . Hypertension Brother   . Diabetes Brother     Social History Social History  Substance Use Topics  . Smoking status: Former Smoker    Packs/day: 2.00    Years: 20.00     Types: Cigarettes    Quit date: 08/27/1977  . Smokeless tobacco: Never Used  . Alcohol use No     Allergies   Patient has no known allergies.   Review of Systems Review of Systems  Constitutional: Negative for chills and fever.  HENT: Negative for sore throat.   Eyes: Negative for redness.  Respiratory: Negative for shortness of breath.   Cardiovascular: Negative for chest pain.  Gastrointestinal: Negative for abdominal pain.  Genitourinary: Negative for flank pain.  Musculoskeletal: Negative for back pain and neck pain.  Skin: Negative for rash.  Neurological: Negative for weakness, numbness and headaches.  Hematological: Does not bruise/bleed easily.  Psychiatric/Behavioral: Positive for confusion.     Physical Exam Updated Vital Signs BP 176/97 (BP Location: Right Arm)   Pulse (!) 53   Temp 97.7 F (36.5 C)   Resp 18   Physical Exam  Constitutional: He appears well-developed and well-nourished. No distress.  HENT:  Head: Atraumatic.  Mouth/Throat: Oropharynx is clear and moist.  Eyes: Conjunctivae are normal.  Neck: Neck supple. No tracheal deviation present. No thyromegaly present.  No bruit. No stiffness or rigidity   Cardiovascular: Normal rate, normal heart sounds and intact distal pulses.   Pulmonary/Chest: Effort normal and breath sounds normal. No accessory muscle usage. No respiratory distress.  Abdominal: Soft. He exhibits no distension. There is no tenderness.  Genitourinary:  Genitourinary Comments: No cva tenderness  Musculoskeletal:  Mild, symmetric, bilateral lower leg/ankle edema.   Neurological: He is alert.  Alert, speech clear/fluent. Smiling, cooperative. Motor intact bil, stre 5/5. sens grossly intact. Oriented to person, not place, day or date.  Confabulates in response to many questions.   Skin: Skin is warm and dry. He is not diaphoretic.  Psychiatric: He has a normal mood and affect.  Nursing note and vitals reviewed.    ED  Treatments / Results  Labs (all labs ordered are listed, but only abnormal results are displayed) Results for orders placed or performed during the hospital encounter of 08/22/16  CBC  Result Value Ref Range   WBC 5.8 4.0 - 10.5 K/uL   RBC 3.97 (L) 4.22 - 5.81 MIL/uL   Hemoglobin 11.2 (L) 13.0 - 17.0 g/dL   HCT 32.7 (L) 39.0 - 52.0 %   MCV 82.4 78.0 - 100.0 fL   MCH 28.2 26.0 - 34.0 pg   MCHC 34.3 30.0 - 36.0 g/dL   RDW 12.1 11.5 - 15.5 %   Platelets 205 150 - 400 K/uL  Basic metabolic panel  Result Value Ref Range   Sodium 134 (L) 135 - 145 mmol/L   Potassium 3.2 (L) 3.5 - 5.1 mmol/L   Chloride 97 (L) 101 - 111 mmol/L   CO2 25 22 - 32 mmol/L   Glucose, Bld 99 65 - 99 mg/dL   BUN 22 (H) 6 - 20 mg/dL   Creatinine, Ser 0.84 0.61 - 1.24 mg/dL   Calcium 10.0 8.9 - 10.3 mg/dL   GFR calc non Af Amer >60 >60 mL/min   GFR calc Af Amer >60 >60 mL/min   Anion gap 12 5 - 15  Protime-INR  Result Value Ref Range   Prothrombin Time 26.5 (H) 11.4 - 15.2 seconds   INR 2.39   Urinalysis, Routine w reflex microscopic  Result Value Ref Range   Color, Urine STRAW (A) YELLOW   APPearance CLEAR CLEAR   Specific Gravity, Urine 1.008 1.005 - 1.030   pH 9.0 (H) 5.0 - 8.0   Glucose, UA NEGATIVE NEGATIVE mg/dL   Hgb urine dipstick NEGATIVE NEGATIVE   Bilirubin Urine NEGATIVE NEGATIVE   Ketones, ur NEGATIVE NEGATIVE mg/dL   Protein, ur NEGATIVE NEGATIVE mg/dL   Nitrite NEGATIVE NEGATIVE   Leukocytes, UA NEGATIVE NEGATIVE   Ct Head Wo Contrast  Result Date: 08/22/2016 CLINICAL DATA:  Altered mental status, confusion EXAM: CT HEAD WITHOUT CONTRAST TECHNIQUE: Contiguous axial images were obtained from the base of the skull through the vertex without intravenous contrast. COMPARISON:  None. FINDINGS: Brain: Diffuse cerebral atrophy. Mild chronic microvascular disease throughout the deep white matter. No acute intracranial abnormality. Specifically, no hemorrhage, hydrocephalus, mass lesion, acute  infarction, or  significant intracranial injury. Vascular: No hyperdense vessel or unexpected calcification. Skull: No acute calvarial abnormality. Sinuses/Orbits: Visualized paranasal sinuses and mastoids clear. Orbital soft tissues unremarkable. Other: None IMPRESSION: No acute intracranial abnormality. Atrophy, chronic microvascular disease. Electronically Signed   By: Rolm Baptise M.D.   On: 08/22/2016 15:30    EKG  EKG Interpretation None       Radiology No results found.  Procedures Procedures (including critical care time)  Medications Ordered in ED Medications - No data to display   Initial Impression / Assessment and Plan / ED Course  I have reviewed the triage vital signs and the nursing notes.  Pertinent labs & imaging results that were available during my care of the patient were reviewed by me and considered in my medical decision making (see chart for details).  Clinical Course     Labs.   Reviewed nursing notes and prior charts for additional history.   [facility] consulted re evaluation.  Recheck, pt calm and alert, no new c/o.    [facility] team is recommending inpatient geropsych tx.  Family updated. [facility] team remains working on placement.     Final Clinical Impressions(s) / ED Diagnoses   Final diagnoses:  None    New Prescriptions New Prescriptions   No medications on file           Lajean Saver, MD 08/22/16 2109

## 2016-08-23 ENCOUNTER — Emergency Department (HOSPITAL_COMMUNITY): Payer: PPO

## 2016-08-23 ENCOUNTER — Telehealth: Payer: Self-pay | Admitting: Neurology

## 2016-08-23 ENCOUNTER — Telehealth (HOSPITAL_COMMUNITY): Payer: Self-pay | Admitting: *Deleted

## 2016-08-23 DIAGNOSIS — Z7982 Long term (current) use of aspirin: Secondary | ICD-10-CM

## 2016-08-23 DIAGNOSIS — Z9889 Other specified postprocedural states: Secondary | ICD-10-CM | POA: Diagnosis not present

## 2016-08-23 DIAGNOSIS — Z8249 Family history of ischemic heart disease and other diseases of the circulatory system: Secondary | ICD-10-CM

## 2016-08-23 DIAGNOSIS — Z01818 Encounter for other preprocedural examination: Secondary | ICD-10-CM | POA: Diagnosis not present

## 2016-08-23 DIAGNOSIS — Z79899 Other long term (current) drug therapy: Secondary | ICD-10-CM

## 2016-08-23 DIAGNOSIS — F0391 Unspecified dementia with behavioral disturbance: Secondary | ICD-10-CM

## 2016-08-23 DIAGNOSIS — F03918 Unspecified dementia, unspecified severity, with other behavioral disturbance: Secondary | ICD-10-CM | POA: Diagnosis present

## 2016-08-23 DIAGNOSIS — Z9049 Acquired absence of other specified parts of digestive tract: Secondary | ICD-10-CM | POA: Diagnosis not present

## 2016-08-23 DIAGNOSIS — Z833 Family history of diabetes mellitus: Secondary | ICD-10-CM

## 2016-08-23 DIAGNOSIS — Z87891 Personal history of nicotine dependence: Secondary | ICD-10-CM

## 2016-08-23 LAB — PROTIME-INR
INR: 2.06
PROTHROMBIN TIME: 23.5 s — AB (ref 11.4–15.2)

## 2016-08-23 LAB — RAPID URINE DRUG SCREEN, HOSP PERFORMED
AMPHETAMINES: NOT DETECTED
Barbiturates: NOT DETECTED
Benzodiazepines: NOT DETECTED
Cocaine: NOT DETECTED
OPIATES: NOT DETECTED
Tetrahydrocannabinol: NOT DETECTED

## 2016-08-23 MED ORDER — WARFARIN SODIUM 10 MG PO TABS
10.0000 mg | ORAL_TABLET | Freq: Once | ORAL | Status: DC
  Filled 2016-08-23: qty 1

## 2016-08-23 NOTE — Progress Notes (Signed)
Entered in d/c instructions Star Age, MD  Neurology Radiology [phone] [facility] [address] [place] [address]   Next Steps: Go on 11/15/2016  Instructions: You have been scheduled an appointment on November 15 2016 at 9-9:30 am The [place] neurological office RN may call you if an earlier appointment is available

## 2016-08-23 NOTE — Progress Notes (Signed)
ANTICOAGULATION CONSULT NOTE - Follow up  Pharmacy Consult for warfarin Indication: atrial fibrillation, St Jude AVR  No Known Allergies  Patient Measurements:   Heparin Dosing Weight:   Vital Signs: Temp: 97.8 F (36.6 C) (12/28 0527) Temp Source: Oral (12/28 0527) BP: 175/81 (12/28 0853) Pulse Rate: 74 (12/28 0853)  Labs:  Recent Labs  08/22/16 1456 08/23/16 0449  HGB 11.2*  --   HCT 32.7*  --   PLT 205  --   LABPROT 26.5* 23.5*  INR 2.39 2.06  CREATININE 0.84  --     CrCl cannot be calculated (Unknown ideal weight.).   Medical History: Past Medical History:  Diagnosis Date  . Aortic valve replaced   . Arthritis   . Atrial fibrillation ([place])    longterm persistent  . Back pain, chronic    SINCE BACK INJURY / FRACTURE  . BPH (benign prostatic hyperplasia)    grapey  . CAD (coronary artery disease)   . Cancer (HCC)    RIGHT RENAL  . Constipation   . Cough 06/16/12   C/O OF COUGH / COLD FOR A COUPLE OF WEEKS  . Diverticulitis    history  . Elevated fasting glucose 2012  . GERD (gastroesophageal reflux disease)   . HTN (hypertension)   . Hyperlipidemia   . Hypokalemia   . Kidney disorder    ablation defect in the right kidney lower pole but progression of interpolar lesion: stable on repeat CT (06/2014)  . Right renal mass    renal cell carcinoma  . Shortness of breath    PT STATES RELATED TO HIS AF AND HEART BEING OUT OF RHYTHM  . Stomach ulcer     Assessment: 52 YOM presents to ED with mental status changes.  He is on chronic warfarin for afib and St. Jude AVR.  Pharmacy asked to dose warfarin while in hospital.  Home regimen:  Warfarin 7.5mg  MWFSa, 10mg  TTSu (LD 12/26)  Today, 08/23/2016:  INR therapeutic this AM but dropped to lower end of goal range - 7.5mg  x 1 given yesterday  CBC: Hgb = 11.2, pltc WNL  No reported bleeding per notes  Goal of Therapy:  INR 2-3    Plan:   Warfarin 10mg  today as per home regimen above  Daily  INR   Adrian Saran, PharmD, BCPS Pager [phone] 08/23/2016 11:07 AM

## 2016-08-23 NOTE — Progress Notes (Signed)
Spoke with wife at [phone] who referred CM to D[facility] Spoke with [name] at [phone] who confirms appt can be any day early in am around 9 am and she would get pt to appt States her sister in law is visiting pt now but went to go to get lunch and d/c instructions including appt can be given to her [name] states she will meet pt at the pt's home later today

## 2016-08-23 NOTE — Telephone Encounter (Signed)
Kim/RN Case Mgr [facility] ED referral [phone] called said the pt was seen in the ED yesterday for dementia. He has not been seen for this condition at the clinic. She is wanting to know if he could be seen sooner, and if so pls call the daughter [name] at [phone].

## 2016-08-23 NOTE — Progress Notes (Signed)
CM went to health team advantage web site for search for in network neurologist Cm call first listed neurologist  Spoke with [facility]ic staff, Lovey Newcomer, at [place]  Lovey Newcomer noted pt is already being seen by a neurologist at the [place] neurological office for another reason Able to assist with a November 15 2016 at 0900 appt and sent message back to providers nurse to see if an earlier appt could be made for pt any day at 9 am per dtr request

## 2016-08-23 NOTE — BHH Suicide Risk Assessment (Signed)
Suicide Risk Assessment  Discharge Assessment   [facility] Discharge Suicide Risk Assessment   Principal Problem: Dementia with behavioral disturbance Discharge Diagnoses:  Patient Active Problem List   Diagnosis Date Noted  . Dementia with behavioral disturbance [F03.91] 08/23/2016    Priority: High  . Right renal mass [N28.89]   . Renal carcinoma ([place]) [C64.9]   . 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 Encounter for therapeutic drug monitoring [Z51.81] 11/06/2013  . Acute NSTEMI (non-ST elevated myocardial infarction) [I21.4] 09/26/2013  . Chronic calculous cholecystitis [K80.10] 09/08/2013  . Acalculous cholecystitis [K81.9] 09/04/2013  . Abdominal pain, other specified site [R10.9] 09/01/2013  . Constipation [K59.00] 09/01/2013  . Hypotension [I95.9] 09/01/2013  . Leukocytosis [D72.829] 09/01/2013  . Normocytic anemia [D64.9] 09/01/2013  . Hyponatremia [E87.1] 09/01/2013  . Bruit [R09.89] 04/21/2013  . Long term (current) use of anticoagulants [Z79.01] 11/22/2010  . Chronic diastolic heart failure ([place]) [I50.32] 11/03/2010  . Aortic valve disorder [I35.9] 10/23/2010  . EDEMA [R60.9] 04/26/2010  . DYSPNEA [R06.02] 04/26/2010  . CHEST PAIN [R07.9] 04/26/2010  . Elevated lipids [E78.5] 01/21/2009  . HYPOKALEMIA [E87.6] 01/21/2009  . Essential hypertension [I10] 01/21/2009  . Coronary atherosclerosis [I25.10] 01/21/2009  . Atrial fibrillation ([place]) [I48.91] 01/21/2009  . ALTERED MENTAL STATUS [R41.82] 01/21/2009  . AORTIC VALVE REPLACEMENT, HX OF [Z95.4] 01/21/2009    Total Time spent with patient: 45 minutes  Musculoskeletal: Strength & Muscle Tone: within normal limits Gait & Station: normal Patient leans: N/A  Psychiatric Specialty Exam: Physical Exam  Constitutional: He is oriented to person, place, and time. He appears well-developed and well-nourished.  HENT:  Head: Normocephalic.  Neck: Normal range of motion.  Respiratory: Effort normal.  Musculoskeletal: Normal  range of motion.  Neurological: He is alert and oriented to person, place, and time.  Psychiatric: He has a normal mood and affect. His speech is normal and behavior is normal. Judgment and thought content normal. Cognition and memory are impaired.    Review of Systems  Psychiatric/Behavioral: Positive for memory loss.  All other systems reviewed and are negative.   Blood pressure 175/81, pulse 74, temperature 97.8 F (36.6 C), temperature source Oral, resp. rate 20, SpO2 99 %.There is no height or weight on file to calculate BMI.  General Appearance: Casual  Eye Contact:  Good  Speech:  Normal Rate  Volume:  Normal  Mood:  Euthymic  Affect:  Congruent  Thought Process:  Coherent and Descriptions of Associations: Intact  Orientation:  Full (Time, Place, and Person)  Thought Content:  WDL  Suicidal Thoughts:  No  Homicidal Thoughts:  No  Memory:  Immediate;   Fair Recent;   Fair Remote;   Fair  Judgement:  Fair  Insight:  Fair  Psychomotor Activity:  Normal  Concentration:  Concentration: Good and Attention Span: Good  Recall:  Good  Fund of Knowledge:  Good  Language:  Good  Akathisia:  No  Handed:  Right  AIMS (if indicated):     Assets:  Housing Leisure Time Physical Health Resilience Social Support  ADL's:  Intact  Cognition:  Impaired,  Mild  Sleep:       Mental Status Per Nursing Assessment::   On Admission:   agiation  Demographic Factors:  Male, Age 80 or older and Caucasian  Loss Factors: NA  Historical Factors: NA  Risk Reduction Factors:   Sense of responsibility to family, Living with another person, especially a relative, Positive social support and Positive therapeutic relationship  Continued Clinical Symptoms:  Memory loss  Cognitive Features That Contribute To Risk:  None    Suicide Risk:  Minimal: No identifiable suicidal ideation.  Patients presenting with no risk factors but with morbid ruminations; may be classified as minimal risk  based on the severity of the depressive symptoms  Follow-up Information    Star Age, MD. Go on 11/15/2016.   Specialties:  Neurology, Radiology Why:  You have been scheduled an appointment on November 15 2016 at 9-9:30 am The [place] neurological office RN may call you if an earlier appointment is available  Contact information: [address] [place] [address] [phone]           Plan Of Care/Follow-up recommendations:  Activity:  as tolerated Diet:  heart healthy diet  Zuley Lutter, NP 08/23/2016, 2:28 PM

## 2016-08-23 NOTE — Progress Notes (Signed)
Pleasant pt noted coming out ot his room restroom after being assisted by CNA Cm discussed with pt that CM needs to find a neurologist for follow up care Pt informed CM he has a neurologist "Doctor Evette Doffing" but there is not a neurologist with that name  Pt gave CM permission to contact his family for assistance with an neurology appt

## 2016-08-23 NOTE — Consult Note (Signed)
[facility] Psychiatry Consult   Reason for Consult:  Agitation with memory loss Referring Physician:  EDP Patient Identification: [name] MRN:  [id number] Principal Diagnosis: Dementia with behavioral disturbance Diagnosis:   Patient Active Problem List   Diagnosis Date Noted  . Dementia with behavioral disturbance [F03.91] 08/23/2016    Priority: High  . Right renal mass [N28.89]   . Renal carcinoma ([place]) [C64.9]   . 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 Encounter for therapeutic drug monitoring [Z51.81] 11/06/2013  . Acute NSTEMI (non-ST elevated myocardial infarction) [I21.4] 09/26/2013  . Chronic calculous cholecystitis [K80.10] 09/08/2013  . Acalculous cholecystitis [K81.9] 09/04/2013  . Abdominal pain, other specified site [R10.9] 09/01/2013  . Constipation [K59.00] 09/01/2013  . Hypotension [I95.9] 09/01/2013  . Leukocytosis [D72.829] 09/01/2013  . Normocytic anemia [D64.9] 09/01/2013  . Hyponatremia [E87.1] 09/01/2013  . Bruit [R09.89] 04/21/2013  . Long term (current) use of anticoagulants [Z79.01] 11/22/2010  . Chronic diastolic heart failure ([place]) [I50.32] 11/03/2010  . Aortic valve disorder [I35.9] 10/23/2010  . EDEMA [R60.9] 04/26/2010  . DYSPNEA [R06.02] 04/26/2010  . CHEST PAIN [R07.9] 04/26/2010  . Elevated lipids [E78.5] 01/21/2009  . HYPOKALEMIA [E87.6] 01/21/2009  . Essential hypertension [I10] 01/21/2009  . Coronary atherosclerosis [I25.10] 01/21/2009  . Atrial fibrillation ([place]) [I48.91] 01/21/2009  . ALTERED MENTAL STATUS [R41.82] 01/21/2009  . AORTIC VALVE REPLACEMENT, HX OF [Z95.4] 01/21/2009    Total Time spent with patient: 45 minutes  Subjective:   [name] is a 82 y.o. male patient states, "I have a problem with my, can't think of it.  I can't recall things."    HPI:  82 yo male who was brought to the ED after confusion for the past month that progressed quickly.  His family having to intercept his agitation daily and  yesterday he "couldn't snap out of it."  He knows his family members by name but loses things and becomes frustrated and anxious when he can't find it or remember.  He sees Dr. Addison Lank at [place] but has never had a dementia assessment, neurology referral made.  Patient denies suicidal/homicidal ideations, hallucinations, and alcohol/drug abuse.  Pleasant and cooperative on assessment, stable for discharge.  Past Psychiatric History: anxiety  Risk to Self: Suicidal Ideation: No Suicidal Intent: No Is patient at risk for suicide?: No Suicidal Plan?: No Access to Means: No What has been your use of drugs/alcohol within the last 12 months?: NA How many times?: 0 Other Self Harm Risks: NA Triggers for Past Attempts: None known Intentional Self Injurious Behavior: None (Pt denies. ) Risk to Others: Homicidal Ideation: No (Pt denies.) Thoughts of Harm to Others: No Current Homicidal Intent: No Current Homicidal Plan: No Access to Homicidal Means: No Identified Victim: NA History of harm to others?: No Assessment of Violence: None Noted Violent Behavior Description: NA Does patient have access to weapons?: No Criminal Charges Pending?: No Does patient have a court date: No Prior Inpatient Therapy: Prior Inpatient Therapy: No Prior Therapy Dates: NA Prior Therapy Facilty/Provider(s): NA Reason for Treatment: NA Prior Outpatient Therapy: Prior Outpatient Therapy: No Prior Therapy Dates: NA Prior Therapy Facilty/Provider(s): NA Reason for Treatment: NA Does patient have an ACCT team?: No Does patient have Intensive In-House Services?  : No Does patient have [facility] services? : No Does patient have P4CC services?: No  Past Medical History:  Past Medical History:  Diagnosis Date  . Aortic valve replaced   . Arthritis   . Atrial fibrillation ([place])    longterm  persistent  . Back pain, chronic    SINCE BACK INJURY / FRACTURE  . BPH (benign prostatic hyperplasia)    grapey  . CAD  (coronary artery disease)   . Cancer (HCC)    RIGHT RENAL  . Constipation   . Cough 06/16/12   C/O OF COUGH / COLD FOR A COUPLE OF WEEKS  . Diverticulitis    history  . Elevated fasting glucose 2012  . GERD (gastroesophageal reflux disease)   . HTN (hypertension)   . Hyperlipidemia   . Hypokalemia   . Kidney disorder    ablation defect in the right kidney lower pole but progression of interpolar lesion: stable on repeat CT (06/2014)  . Right renal mass    renal cell carcinoma  . Shortness of breath    PT STATES RELATED TO HIS AF AND HEART BEING OUT OF RHYTHM  . Stomach ulcer     Past Surgical History:  Procedure Laterality Date  . AORTIC VALVE REPLACEMENT    . CHOLECYSTECTOMY N/A 09/08/2013   Procedure: LAPAROSCOPIC CHOLECYSTECTOMY WITH INTRAOPERATIVE CHOLANGIOGRAM;  Surgeon: Odis Hollingshead, MD;  Location: [facility] ORS;  Service: General;  Laterality: N/A;  . CORONARY ARTERY BYPASS GRAFT    . KNEE SURGERY    . LEFT HEART CATHETERIZATION WITH CORONARY/GRAFT ANGIOGRAM N/A 09/07/2013   Procedure: LEFT HEART CATHETERIZATION WITH Beatrix Fetters;  Surgeon: Sinclair Grooms, MD;  Location: [facility] C[facility] LAB;  Service: Cardiovascular;  Laterality: N/A;  . RADIOFREQUENCY ABLATION KIDNEY     RIGHT (two surgeries)  . Thoracic T12 compression fracture     Family History:  Family History  Problem Relation Age of Onset  . Hypertension Mother   . Diabetes Mother   . Hypertension Sister   . Diabetes Sister   . Hypertension Brother   . Diabetes Brother    Family Psychiatric  History: none Social History:  History  Alcohol Use No     History  Drug Use No    Social History   Social History  . Marital status: Married    Spouse name: N/A  . Number of children: 2  . Years of education: N/A   Occupational History  .      retired   Social History Main Topics  . Smoking status: Former Smoker    Packs/day: 2.00    Years: 20.00    Types: Cigarettes    Quit date: 08/27/1977  .  Smokeless tobacco: Never Used  . Alcohol use No  . Drug use: No  . Sexual activity: Not Asked   Other Topics Concern  . None   Social History Narrative   Lives with his wife, good family support, ambulates without assist device.   Caffeine 2 cups daily.    Additional Social History:    Allergies:  No Known Allergies  Labs:  Results for orders placed or performed during the hospital encounter of 08/22/16 (from the past 48 hour(s))  CBC     Status: Abnormal   Collection Time: 08/22/16  2:56 PM  Result Value Ref Range   WBC 5.8 4.0 - 10.5 K/uL   RBC 3.97 (L) 4.22 - 5.81 MIL/uL   Hemoglobin 11.2 (L) 13.0 - 17.0 g/dL   HCT 32.7 (L) 39.0 - 52.0 %   MCV 82.4 78.0 - 100.0 fL   MCH 28.2 26.0 - 34.0 pg   MCHC 34.3 30.0 - 36.0 g/dL   RDW 12.1 11.5 - 15.5 %   Platelets 205 150 - 400  K/uL  Basic metabolic panel     Status: Abnormal   Collection Time: 08/22/16  2:56 PM  Result Value Ref Range   Sodium 134 (L) 135 - 145 mmol/L   Potassium 3.2 (L) 3.5 - 5.1 mmol/L   Chloride 97 (L) 101 - 111 mmol/L   CO2 25 22 - 32 mmol/L   Glucose, Bld 99 65 - 99 mg/dL   BUN 22 (H) 6 - 20 mg/dL   Creatinine, Ser 0.84 0.61 - 1.24 mg/dL   Calcium 10.0 8.9 - 10.3 mg/dL   GFR calc non Af Amer >60 >60 mL/min   GFR calc Af Amer >60 >60 mL/min    Comment: (NOTE) The eGFR has been calculated using the CKD EPI equation. This calculation has not been validated in all clinical situations. eGFR's persistently <60 mL/min signify possible Chronic Kidney Disease.    Anion gap 12 5 - 15  Protime-INR     Status: Abnormal   Collection Time: 08/22/16  2:56 PM  Result Value Ref Range   Prothrombin Time 26.5 (H) 11.4 - 15.2 seconds   INR 2.39   Digoxin level     Status: Abnormal   Collection Time: 08/22/16  2:56 PM  Result Value Ref Range   Digoxin Level 0.2 (L) 0.8 - 2.0 ng/mL  Urinalysis, Routine w reflex microscopic     Status: Abnormal   Collection Time: 08/22/16  3:00 PM  Result Value Ref Range    Color, Urine STRAW (A) YELLOW   APPearance CLEAR CLEAR   Specific Gravity, Urine 1.008 1.005 - 1.030   pH 9.0 (H) 5.0 - 8.0   Glucose, UA NEGATIVE NEGATIVE mg/dL   Hgb urine dipstick NEGATIVE NEGATIVE   Bilirubin Urine NEGATIVE NEGATIVE   Ketones, ur NEGATIVE NEGATIVE mg/dL   Protein, ur NEGATIVE NEGATIVE mg/dL   Nitrite NEGATIVE NEGATIVE   Leukocytes, UA NEGATIVE NEGATIVE  Protime-INR     Status: Abnormal   Collection Time: 08/23/16  4:49 AM  Result Value Ref Range   Prothrombin Time 23.5 (H) 11.4 - 15.2 seconds   INR 2.06   Urine rapid drug screen (hosp performed)     Status: None   Collection Time: 08/23/16  8:17 AM  Result Value Ref Range   Opiates NONE DETECTED NONE DETECTED   Cocaine NONE DETECTED NONE DETECTED   Benzodiazepines NONE DETECTED NONE DETECTED   Amphetamines NONE DETECTED NONE DETECTED   Tetrahydrocannabinol NONE DETECTED NONE DETECTED   Barbiturates NONE DETECTED NONE DETECTED    Comment:        DRUG SCREEN FOR MEDICAL PURPOSES ONLY.  IF CONFIRMATION IS NEEDED FOR ANY PURPOSE, NOTIFY LAB WITHIN 5 DAYS.        LOWEST DETECTABLE LIMITS FOR URINE DRUG SCREEN Drug Class       Cutoff (ng/mL) Amphetamine      1000 Barbiturate      200 Benzodiazepine   814 Tricyclics       481 Opiates          300 Cocaine          300 THC              50     Current Facility-Administered Medications  Medication Dose Route Frequency Provider Last Rate Last Dose  . aspirin EC tablet 81 mg  81 mg Oral Daily Lajean Saver, MD   81 mg at 08/23/16 8563  . atorvastatin (LIPITOR) tablet 40 mg  40 mg Oral Daily Lajean Saver, MD  40 mg at 08/23/16 0929  . carvedilol (COREG) tablet 12.5 mg  12.5 mg Oral BID WC Lajean Saver, MD   12.5 mg at 08/23/16 0900  . digoxin (LANOXIN) tablet 125 mcg  125 mcg Oral Daily Lajean Saver, MD   125 mcg at 08/23/16 0930  . escitalopram (LEXAPRO) tablet 10 mg  10 mg Oral Daily Lajean Saver, MD   10 mg at 08/23/16 4008  . oxybutynin (DITROPAN-XL) 24  hr tablet 10 mg  10 mg Oral QODAY Lajean Saver, MD   10 mg at 08/23/16 0929  . ramipril (ALTACE) capsule 10 mg  10 mg Oral BID Lajean Saver, MD   10 mg at 08/23/16 6761  . tamsulosin (FLOMAX) capsule 0.4 mg  0.4 mg Oral Daily Lajean Saver, MD   0.4 mg at 08/23/16 0929  . torsemide (DEMADEX) tablet 10 mg  10 mg Oral Daily Lajean Saver, MD   10 mg at 08/23/16 0929  . warfarin (COUMADIN) tablet 10 mg  10 mg Oral ONCE-1800 Adrian Saran, [facility]      . Warfarin - Pharmacist Dosing Inpatient   Does not apply q1800 Berton Mount, [facility]       Current Outpatient Prescriptions  Medication Sig Dispense Refill  . aspirin EC 81 MG EC tablet Take 1 tablet (81 mg total) by mouth daily. 30 tablet 0  . atorvastatin (LIPITOR) 40 MG tablet Take 40 mg by mouth daily.  3  . carvedilol (COREG) 25 MG tablet TAKE 1 TABLET BY MOUTH TWICE DAILY WITH A MEAL 60 tablet 2  . digoxin (DIGOX) 0.125 MG tablet Take 1 tablet (125 mcg total) by mouth daily. 30 tablet 11  . escitalopram (LEXAPRO) 10 MG tablet Take 10 mg by mouth daily.    . ferrous sulfate 325 (65 FE) MG EC tablet Take 325 mg by mouth daily with breakfast.    . oxybutynin (DITROPAN-XL) 10 MG 24 hr tablet Take 10 mg by mouth every other day.     . potassium chloride (KLOR-CON) 8 MEQ tablet TAKE 1 TABLET BY MOUTH EVERY DAY 30 tablet 3  . Psyllium (METAMUCIL) 28.3 % POWD Take 1 packet by mouth daily as needed (fiber).    . ramipril (ALTACE) 10 MG capsule Take 10 mg by mouth 2 (two) times daily.    . Tamsulosin HCl (FLOMAX) 0.4 MG CAPS Take 0.4 mg by mouth daily.     [name] torsemide (DEMADEX) 10 MG tablet TAKE 1 TABLET BY MOUTH TWICE DAILY (Patient taking differently: TAKE 1 TABLET BY MOUTH DAILY) 60 tablet 6  . vitamin B-12 (CYANOCOBALAMIN) 1000 MCG tablet Take 1,000 mcg by mouth daily.      . Vitamin D, Cholecalciferol, 400 units TABS Take 1 tablet by mouth daily.    [name] warfarin (COUMADIN) 5 MG tablet TAKE AS DIRECTED BY COUMADIN CLINIC (Patient taking differently: No  sig reported) 180 tablet 0  . cholecalciferol (VITAMIN D) 1000 UNITS tablet Take 1,000 Units by mouth daily.    . famotidine (PEPCID) 20 MG tablet Take 20 mg by mouth 2 (two) times daily.    . nitroGLYCERIN (NITROSTAT) 0.4 MG SL tablet Place 1 tablet (0.4 mg total) under the tongue every 5 (five) minutes as needed. For chest pain 25 tablet 5  . potassium chloride (KLOR-CON) 8 MEQ tablet TAKE 1 TABLET(8 MEQ) BY MOUTH DAILY (Patient not taking: Reported on 08/22/2016) 30 tablet 11  . ramipril (ALTACE) 5 MG capsule Take 5 mg by mouth daily.    [name]  Thiamine HCl (VITAMIN B-1) 250 MG tablet Take 250 mg by mouth daily.      Musculoskeletal: Strength & Muscle Tone: within normal limits Gait & Station: normal Patient leans: N/A  Psychiatric Specialty Exam: Physical Exam  Constitutional: He is oriented to person, place, and time. He appears well-developed and well-nourished.  HENT:  Head: Normocephalic.  Neck: Normal range of motion.  Respiratory: Effort normal.  Musculoskeletal: Normal range of motion.  Neurological: He is alert and oriented to person, place, and time.  Psychiatric: He has a normal mood and affect. His speech is normal and behavior is normal. Judgment and thought content normal. Cognition and memory are impaired.    Review of Systems  Psychiatric/Behavioral: Positive for memory loss.  All other systems reviewed and are negative.   Blood pressure 175/81, pulse 74, temperature 97.8 F (36.6 C), temperature source Oral, resp. rate 20, SpO2 99 %.There is no height or weight on file to calculate BMI.  General Appearance: Casual  Eye Contact:  Good  Speech:  Normal Rate  Volume:  Normal  Mood:  Euthymic  Affect:  Congruent  Thought Process:  Coherent and Descriptions of Associations: Intact  Orientation:  Full (Time, Place, and Person)  Thought Content:  WDL  Suicidal Thoughts:  No  Homicidal Thoughts:  No  Memory:  Immediate;   Fair Recent;   Fair Remote;   Fair   Judgement:  Fair  Insight:  Fair  Psychomotor Activity:  Normal  Concentration:  Concentration: Good and Attention Span: Good  Recall:  Good  Fund of Knowledge:  Good  Language:  Good  Akathisia:  No  Handed:  Right  AIMS (if indicated):     Assets:  Housing Leisure Time Physical Health Resilience Social Support  ADL's:  Intact  Cognition:  Impaired,  Mild  Sleep:        Treatment Plan Summary: Daily contact with patient to assess and evaluate symptoms and progress in treatment, Medication management and Plan dementia with behavioral disturbance:  -Crisis stabilization -Medication management:  Restart medical medications along with Lexapro 10 mg daily for depression/anxiety -Individual counseling  Disposition: No evidence of imminent risk to self or others at present.    Waylan Boga, NP 08/23/2016 2:17 PM

## 2016-08-23 NOTE — Progress Notes (Signed)
[facility] called and requested UDS, chest x-ray, and EKG. Informed TCU.

## 2016-08-23 NOTE — Telephone Encounter (Signed)
Pt on ED afib follow up report, but f/u at our office is not appropriate course for pt. Pt ED visit for dementia.

## 2016-08-25 NOTE — Progress Notes (Signed)
This Probation officer reviewed chart because of placement facilities calling with availability.    Chesley Noon, MSW, Loura Pardon [facility] Triage Specialist [phone]

## 2016-08-26 ENCOUNTER — Other Ambulatory Visit: Payer: Self-pay | Admitting: Cardiovascular Disease

## 2016-08-28 ENCOUNTER — Telehealth: Payer: Self-pay | Admitting: Cardiovascular Disease

## 2016-08-28 ENCOUNTER — Ambulatory Visit (INDEPENDENT_AMBULATORY_CARE_PROVIDER_SITE_OTHER): Payer: PPO | Admitting: Cardiology

## 2016-08-28 ENCOUNTER — Telehealth: Payer: Self-pay | Admitting: Diagnostic Neuroimaging

## 2016-08-28 DIAGNOSIS — Z5181 Encounter for therapeutic drug level monitoring: Secondary | ICD-10-CM

## 2016-08-28 DIAGNOSIS — I482 Chronic atrial fibrillation, unspecified: Secondary | ICD-10-CM

## 2016-08-28 DIAGNOSIS — I359 Nonrheumatic aortic valve disorder, unspecified: Secondary | ICD-10-CM

## 2016-08-28 MED ORDER — WARFARIN SODIUM 5 MG PO TABS: ORAL_TABLET | ORAL | 0 refills | Status: DC

## 2016-08-28 NOTE — Telephone Encounter (Signed)
New message      Pt was seen in the hosp last wed.  Can you get his pt/inr results and adjust his coumadin.  He has dementia and it is difficult getting him to the office.

## 2016-08-28 NOTE — Telephone Encounter (Signed)
Daughter [name] called in. Pt has recently been admitted for AMS, dx'd with possible dementia with behavioral changes. Now back home. Continues to have paranoia and delusions (non-threatening), thinks he is in the TV current events etc, feels that family is withholding Agricultural engineer and info from him. Otherwise, he is calm and non-violent.   Advised to use gentle language, change topic, avoid over-engagement. If pt becomes violent or physcially dangerous, they will need to go back to ER for immediate eval and mgmt. Otherwise, will send message to Dr. Rexene Alberts and nurse to see if they can expedite follow up in clinic. -VRP

## 2016-08-28 NOTE — Telephone Encounter (Signed)
See coumadin encounter done on this date January 2nd

## 2016-08-30 ENCOUNTER — Ambulatory Visit (INDEPENDENT_AMBULATORY_CARE_PROVIDER_SITE_OTHER): Payer: PPO | Admitting: Neurology

## 2016-08-30 ENCOUNTER — Encounter: Payer: Self-pay | Admitting: Neurology

## 2016-08-30 VITALS — BP 150/72 | HR 70 | Resp 18 | Ht 76.0 in | Wt 211.0 lb

## 2016-08-30 DIAGNOSIS — G4733 Obstructive sleep apnea (adult) (pediatric): Secondary | ICD-10-CM

## 2016-08-30 DIAGNOSIS — F0151 Vascular dementia with behavioral disturbance: Secondary | ICD-10-CM

## 2016-08-30 DIAGNOSIS — F01518 Vascular dementia, unspecified severity, with other behavioral disturbance: Secondary | ICD-10-CM

## 2016-08-30 DIAGNOSIS — R410 Disorientation, unspecified: Secondary | ICD-10-CM

## 2016-08-30 NOTE — Patient Instructions (Addendum)
We will do an EEG (brainwave test), which we will schedule. We will call you with the results. We will do a brain scan, called MRI and call you with the test results. We will have to schedule you for this on a separate date. This test requires authorization from your insurance, and we will take care of the insurance process. I would like for you to restart your CPAP if you can.  Stay well hydrated with water.  Continue with the lexapro.

## 2016-08-30 NOTE — Progress Notes (Signed)
Subjective:    Patient ID: [name] is a 83 y.o. male.  HPI     HPI:   [name] is an 83 year old right-handed gentleman with an underlying complex medical history of aortic stenosis, coronary artery disease, status post CABG and St. Jude aortic valve replacement in October 2003, chronic atrial fibrillation, hypertension, hyperlipidemia, T12 compression fracture with status post kyphoplasty, diverticulitis, renal cell mass and treatment resistant urinary incontinence, followed by urology, who presents for a new problem, referred from the ER, for dementia with behavioral disturbance. The patient is accompanied by his son, [name], and his daughter, [name], today. I have been seeing him for gait disorder and obstructive sleep apnea on treatment with CPAP. I last saw him on 02/16/2016, at which time he was fully compliant with CPAP therapy. He felt improved with CPAP. We talked about gait safety and I reordered his brain MRI which he had not done previously.   Today, 08/30/2016: He reports doing okay, his daughter provides most of the Hx and reports that about a week ago, he became very agitated and confused. He has not been using his CPAP for the past 2 months. He has episodic confusion and verbally abuse, agitated, gets fixated with one thing. He gets frustrated and takes his frustration out on his wife. They are getting help at the house. [name] lives close by and [name] lives 5 min away. He was seen in the emergency room on 08/22/2016 for agitation and altered mental status. I reviewed the emergency room records. He was assessed by behavioral health and was not deemed a suicidal risk. He had been placed on Lexapro by his PCP first on 5 mg daily, then 7.5 mg daily, and for the past 2 weeks he has been on 10 mg daily without adverse effects. In the emergency room, workup included blood work, urine test and head CT. UDS was negative, UA was negative for infection, no elevated white cell count, sodium was  borderline at 134, potassium mildly low at 3.2, BUN slightly elevated at 22. INR was within therapeutic range, digoxin level was subtherapeutic. CT head without contrast on 08/22/2016 showed: IMPRESSION: No acute intracranial abnormality. Atrophy, chronic microvascular disease.  There is no history of physical aggression but he did throw out things out of his fridge this morning. Daughter also talked to me separately. She indicates that he can be very unpredictable. She used to be able to calm him down but it is getting more difficult, things are getting worse. They have hired help at the house. His wife is the same age and has a history of cognitive impairment.  Previously:   I saw him on 07/27/2015, at which time he reported doing reasonably well. He felt he was sleeping better with CPAP. We talked about the need for CPAP treatment ongoing. He was reporting nocturia which was fairly unchanged. He noted that he was more off balance when the lights were out and it was dark. He was trying to adjust to his balance: He problems. He reported one fall in the past few months. I reassured her and his brain MRI. He had a CT abdomen and pelvis for recheck of his renal mass on 06/30/2015 which I reviewed: 1. Stable ablation site in the medial aspect of the RIGHT kidney with no evidence of local recurrence. 2. Bosniak 1 and Bosniak 2 renal cysts of the RIGHT kidney.   I reordered a brain MRI. I asked him to use his cane or walker at all  times and stay hydrated and compliant with CPAP therapy.   I reviewed his CPAP compliance data from 01/16/2016 through 02/14/2016 which is a total of 30 days during which time he used his machine every night with percent used days greater than 4 hours at 100%, indicating superb compliance with an average usage of 6 hours and 27 minutes, residual AHI 0.8 per hour, leak low with the 95th percentile at 7.6 L/m on a pressure of 10 cm with EPR of 3.   He had to cancel an appointment  for 05/31/2015. I saw him on 11/29/2014, at which time he reported that he initially had trouble adjusting to CPAP but then had been compliant. He felt that he no longer snoring but was not sure if he slept much better than before. But he did endorse feeling better rested when he first woke up. He had no recent falls. He was using a cane. He denied any restless leg symptoms to speak of. He had no recent medication changes. He was not always drink enough water. He was not exercising regularly. His knees were bothering him. He was getting a procedure for bladder hyperactivity. I asked him to continue to be compliant with CPAP therapy, advised him to change positions slowly, drink more water, and use his cane or 2 wheeled walker at all times for safety. I reordered a brain MRI as he had not had it done when I first ordered it.    I reviewed his CPAP compliance data from 06/26/2015 through 07/25/2015 which is a total of 30 days during which time he used his machine every night, with percent used days greater than 4 hours of 100%, indicating superb compliance with an average usage of 7 hours and 34 minutes, residual AHI low at 0.7 per hour, leak acceptable with the 95th percentile at 10.1 L/m at a pressure of 10 cm with EPR of 3.   I first met him on 08/31/2014 at the request of his primary care physician, at which time he reported a 6 month history of balance problems and recurrent falls. He also reported nonrestorative sleep, daytime tiredness and snoring. At the time of his initial visit, I felt he most likely had a multifactorial gait disturbance secondary to aging, degenerative back disease with abnormal posture, possible medication side effects, in particular with anticholinergic medications, dehydration, and in the context of nonrestorative sleep. He had symptoms of OSA. I advised him to have a sleep study. He had a split-night sleep study on 09/10/2014 and went over his test results with him in detail today.  Baseline sleep efficiency was 72.7%, latency to sleep was 24 minutes, wake after sleep onset was 10.5 minutes with mild sleep fragmentation noted. He had an elevated arousal index. He had absence of REM sleep prior to CPAP initiation. He had A. fib on EKG. He had severe periodic leg movements but minimal arousals. He had mild to moderate and loud snoring. Total AHI was 54.6 per hour. Baseline oxygen saturation was 93%, nadir was 86%. He was titrated on CPAP during the rest of the study starting at approximately 11:10 PM. Arousal index was normal. REM sleep was 6.6% after CPAP initiation. Average oxygen saturation was 94%, nadir was 91%. He had no significant PLMS during the treatment portion of the study. CPAP was titrated from 5-11 cm and his AHI was 0 per hour on a pressure of 10 cm. Based on his sleep test results are prescribed CPAP therapy for home use. I also requested  that he have a brain MRI but this was not done and he is not sure why it was not done and was not aware of the MRI order.   I reviewed his compliance data with CPAP therapy from 10/25/2014 through 11/23/2014 which is a total of 30 days during which time he used his machine every night with percent used days greater than 4 hours at 100%, indicating superb compliance with an average usage of 4 hours and 27 minutes for all nights. Residual AHI low at 0.7 per hour. Leak acceptable with the 95th percentile at 15 L/m on a pressure of 10 cm with EPR of 3.   Has had balance problems and recurrent falls for the past 6 months. He has had physical therapy twice a week for about a month which was not necessarily very helpful. He has been advised to use his walker at all times. He has fallen repeatedly. He has had some shuffling of his walking. He has had severe nocturia, 5-6 times per night for the past 5-6 months. He has been on Vesicare, but will switch to Myrbetriq samples soon. He has occasional morning headaches. He does not wake up rested. He  snores some. He has never had a sleep study. He drinks 2 cups of coffee, but not enough water. He quit smoking in 81 and does not drink alcohol.   He denies vertigo type symptoms. Sometimes he feels lightheaded when he stands quickly. He is particularly insecure in the dark. He still drives but not in the dark. He has not had any recent blood work. He fell yesterday as he was trying to sit on a little stool which did not carry his weight and broke. He hit his head backwards but does not have any residual symptoms. He did not lose consciousness.    His Past Medical History Is Significant For: Past Medical History:  Diagnosis Date  . Aortic valve replaced   . Arthritis   . Atrial fibrillation ([place])    longterm persistent  . Back pain, chronic    SINCE BACK INJURY / FRACTURE  . BPH (benign prostatic hyperplasia)    grapey  . CAD (coronary artery disease)   . Cancer (HCC)    RIGHT RENAL  . Constipation   . Cough 06/16/12   C/O OF COUGH / COLD FOR A COUPLE OF WEEKS  . Diverticulitis    history  . Elevated fasting glucose 2012  . GERD (gastroesophageal reflux disease)   . HTN (hypertension)   . Hyperlipidemia   . Hypokalemia   . Kidney disorder    ablation defect in the right kidney lower pole but progression of interpolar lesion: stable on repeat CT (06/2014)  . Right renal mass    renal cell carcinoma  . Shortness of breath    PT STATES RELATED TO HIS AF AND HEART BEING OUT OF RHYTHM  . Stomach ulcer     His Past Surgical History Is Significant For: Past Surgical History:  Procedure Laterality Date  . AORTIC VALVE REPLACEMENT    . CHOLECYSTECTOMY N/A 09/08/2013   Procedure: LAPAROSCOPIC CHOLECYSTECTOMY WITH INTRAOPERATIVE CHOLANGIOGRAM;  Surgeon: Odis Hollingshead, MD;  Location: [facility];  Service: General;  Laterality: N/A;  . CORONARY ARTERY BYPASS GRAFT    . KNEE SURGERY    . LEFT HEART CATHETERIZATION WITH CORONARY/GRAFT ANGIOGRAM N/A 09/07/2013   Procedure: LEFT HEART  CATHETERIZATION WITH Beatrix Fetters;  Surgeon: Sinclair Grooms, MD;  [facility];  Service:  Cardiovascular;  Laterality: N/A;  . RADIOFREQUENCY ABLATION KIDNEY     RIGHT (two surgeries)  . Thoracic T12 compression fracture      His Family History Is Significant For: Family History  Problem Relation Age of Onset  . Hypertension Mother   . Diabetes Mother   . Hypertension Sister   . Diabetes Sister   . Hypertension Brother   . Diabetes Brother     His Social History Is Significant For: Social History   Social History  . Marital status: Married    Spouse name: N/A  . Number of children: 2  . Years of education: N/A   Occupational History  .      retired   Social History Main Topics  . Smoking status: Former Smoker    Packs/day: 2.00    Years: 20.00    Types: Cigarettes    Quit date: 08/27/1977  . Smokeless tobacco: Never Used  . Alcohol use No  . Drug use: No  . Sexual activity: Not Asked   Other Topics Concern  . None   Social History Narrative   Lives with his wife, good family support, ambulates without assist device.   Caffeine 2 cups daily.     His Allergies Are:  No Known Allergies:   His Current Medications Are:  Outpatient Encounter Prescriptions as of 08/30/2016  Medication Sig  . aspirin EC 81 MG EC tablet Take 1 tablet (81 mg total) by mouth daily.  [name] atorvastatin (LIPITOR) 40 MG tablet Take 40 mg by mouth daily.  . carvedilol (COREG) 25 MG tablet TAKE 1 TABLET BY MOUTH TWICE DAILY WITH A MEAL  . digoxin (DIGOX) 0.125 MG tablet Take 1 tablet (125 mcg total) by mouth daily.  [name] escitalopram (LEXAPRO) 10 MG tablet Take 10 mg by mouth daily.  . nitroGLYCERIN (NITROSTAT) 0.4 MG SL tablet Place 1 tablet (0.4 mg total) under the tongue every 5 (five) minutes as needed. For chest pain  . oxybutynin (DITROPAN-XL) 10 MG 24 hr tablet Take 10 mg by mouth every other day.   . potassium chloride (KLOR-CON) 8 MEQ tablet TAKE 1 TABLET BY MOUTH  EVERY DAY  . ramipril (ALTACE) 10 MG capsule Take 10 mg by mouth 2 (two) times daily.  . ramipril (ALTACE) 5 MG capsule Take 5 mg by mouth daily.  . Tamsulosin HCl (FLOMAX) 0.4 MG CAPS Take 0.4 mg by mouth daily.   [name] torsemide (DEMADEX) 10 MG tablet TAKE 1 TABLET BY MOUTH TWICE DAILY (Patient taking differently: TAKE 1 TABLET BY MOUTH DAILY)  . vitamin B-12 (CYANOCOBALAMIN) 1000 MCG tablet Take 1,000 mcg by mouth daily.    . Vitamin D, Cholecalciferol, 400 units TABS Take 1 tablet by mouth daily.  [name] warfarin (COUMADIN) 5 MG tablet Take as directed by coumadin clinic  . [DISCONTINUED] cholecalciferol (VITAMIN D) 1000 UNITS tablet Take 1,000 Units by mouth daily.  . [DISCONTINUED] famotidine (PEPCID) 20 MG tablet Take 20 mg by mouth 2 (two) times daily.  . [DISCONTINUED] ferrous sulfate 325 (65 FE) MG EC tablet Take 325 mg by mouth daily with breakfast.  . [DISCONTINUED] potassium chloride (KLOR-CON) 8 MEQ tablet TAKE 1 TABLET(8 MEQ) BY MOUTH DAILY (Patient not taking: Reported on 08/22/2016)  . [DISCONTINUED] Psyllium (METAMUCIL) 28.3 % POWD Take 1 packet by mouth daily as needed (fiber).  . [DISCONTINUED] Thiamine HCl (VITAMIN B-1) 250 MG tablet Take 250 mg by mouth daily.   No facility-administered encounter medications on file as of 08/30/2016.   :  Review of Systems:  Out of a complete 14 point review of systems, all are reviewed and negative with the exception of these symptoms as listed below: Review of Systems  Neurological:       Family is concerned because patient appears to be more confused and agitated.     Objective:  Neurologic Exam  Physical Exam Physical Examination:   Vitals:   08/30/16 1522  BP: (!) 150/72  Pulse: 70  Resp: 18   General Examination: The patient is a very pleasant 83 y.o. male in no acute distress. He appears Mildly anxious and deconditioned. He did not bring a cane or walker. He has lost a little bit of weight since his last visit with me.  HEENT:  Normocephalic, atraumatic, pupils are equal, round and reactive to light and accommodation. Extraocular tracking is good without limitation to gaze excursion or nystagmus noted. Normal smooth pursuit is noted. Hearing is mildly impaired. Face is symmetric with normal facial animation and normal facial sensation. Speech is clear with no dysarthria noted. There is mild hypophonia. There is no lip, neck/head, jaw or voice tremor. Neck is supple with full range of passive and active motion. There are no carotid bruits on auscultation. Oropharynx exam reveals: moderate mouth dryness, adequate dental hygiene and moderate airway crowding, due to redundant soft palate and wider uvula. Tonsils are small. Mallampati is class II. Tongue protrudes centrally and palate elevates symmetrically.   Chest: Clear to auscultation without wheezing, rhonchi or crackles noted.  Heart: Irregular and he has a systolic murmur, unchanged.    Abdomen: Soft, non-tender and non-distended with normal bowel sounds appreciated on auscultation.  Extremities: There is no pitting edema in the distal lower extremities bilaterally.  Skin: Warm and dry without trophic changes noted. There are no varicose veins.  Musculoskeletal: exam reveals no obvious joint deformities, tenderness or joint swelling or erythema, with the exception of right knee pain, has a limp on the right. This is worse. Posture is worse as well.  Neurologically:  Mental status: The patient is awake, but is not paying full attention, is somewhat distractible. Unable to provide full history. History is primarily provided by his daughter. His immediate and remote memory, attention, language skills and fund of knowledge are impaired. There is no evidence of aphasia, agnosia, apraxia or anomia. Speech is clear with normal prosody and enunciation. Mood is normal and affect is flat, appears anxious at times. Gets a little agitated when we talk about testing and medications.    On 08/30/2016: MMSE: 21/30, CDT: 3/4, AFT: 8/min.   Cranial nerves II - XII are as described above under HEENT exam. In addition: shoulder shrug is normal with equal shoulder height noted. Motor exam: Normal bulk, strength for age and tone is noted. There is no drift, tremor or rebound. Romberg is not testable today. Reflexes are about 1+, fine motor skills are mildly impaired throughout. Sensory exam: intact to light touch in the upper and lower extremities.  Gait, station and balance: He stands with difficulty, and his son helps him stand. His upper body is tilted slightly to the right and posture is more stooped today. He walks with a limp on the right and smaller steps, walks insecurely, holds onto his son. Tandem walk is not possible.  Assessment and Plan:   In summary, THOREN HOSANG is an 83 year old male with an underlying complex medical history of aortic stenosis, coronary artery disease, status post CABG and St. Jude aortic valve replacement in  October 2003, chronic atrial fibrillation, hypertension, hyperlipidemia, T12 compression fracture with status post kyphoplasty, diverticulitis, renal cell mass and treatment resistant urinary incontinence, who presents for a new problem of memory loss with behavioral disturbance and progression and the past few months. He is no longer on treatment with CPAP. He was previously compliant with CPAP therapy for his severe obstructive sleep apnea. His memory scores are moderately impaired. He is at risk for vascular dementia given his medical history. He does appear to be distractible, mildly confused, slightly agitated today. I had a long discussion with the patient and particularly his children today and also spoke to his daughter separately. I would like to proceed with a brain MRI if possible given his aortic valve replacement with an artificial valve. We will also proceed with an EEG given the episodic character of his confusion. I did talk to her about  potentially utilizing dementia medication. These are not without potential side effects. In the near future we will consider starting Namenda. For now, we will await for the test results. I would recommend that he continue with Lexapro but did suggest that we consult with geriatric psychiatry in the near future. He has recently been started on Lexapro about a month ago and it was recently increased to 10 mg strength, I would not quite yet want to start him on any other new medication at this time. I did talk about home safety with the daughter and advised her strongly to call 911 if there was any concern for safety at home.  We will keep them posted as to the test results and whether or not we can proceed with an MRI which I ordered again today.  Unfortunately, he has not been using his CPAP, his daughter believes that he will not be able to use it at this juncture. Untreated severe obstructive sleep apnea can increase the risk for dementia. Again, he has several vascular risk factors. I would like to see him back after these tests, I encouraged his daughter to email me for any updates in the interim through My Chart. She and her brother are the only children and are encouraged to seek healthcare power of attorney for both parents. I answered all their questions today and the patient and his family were in agreement. Star Age, MD, PhD [facility])

## 2016-09-02 ENCOUNTER — Telehealth: Payer: Self-pay | Admitting: Diagnostic Neuroimaging

## 2016-09-02 NOTE — Telephone Encounter (Signed)
Pt daughter called in. Pt not taking medications and not eating. Advised gentle encouragement and monitoring, if not able to handle physically at home, then would need to call 911 for help and go to hospital. Not at that poiint currently. -VRP

## 2016-09-08 ENCOUNTER — Encounter (HOSPITAL_COMMUNITY): Payer: Self-pay

## 2016-09-08 ENCOUNTER — Emergency Department (HOSPITAL_COMMUNITY)
Admission: EM | Admit: 2016-09-08 | Discharge: 2016-09-09 | Disposition: A | Discharge status: Other Acute Inpatient Hospital | Payer: PPO | Attending: Emergency Medicine | Admitting: Emergency Medicine

## 2016-09-08 DIAGNOSIS — Z049 Encounter for examination and observation for unspecified reason: Secondary | ICD-10-CM

## 2016-09-08 DIAGNOSIS — Z8249 Family history of ischemic heart disease and other diseases of the circulatory system: Secondary | ICD-10-CM | POA: Diagnosis not present

## 2016-09-08 DIAGNOSIS — I2510 Atherosclerotic heart disease of native coronary artery without angina pectoris: Secondary | ICD-10-CM | POA: Insufficient documentation

## 2016-09-08 DIAGNOSIS — F0391 Unspecified dementia with behavioral disturbance: Secondary | ICD-10-CM | POA: Diagnosis not present

## 2016-09-08 DIAGNOSIS — I5032 Chronic diastolic (congestive) heart failure: Secondary | ICD-10-CM | POA: Insufficient documentation

## 2016-09-08 DIAGNOSIS — Z951 Presence of aortocoronary bypass graft: Secondary | ICD-10-CM | POA: Insufficient documentation

## 2016-09-08 DIAGNOSIS — F03918 Unspecified dementia, unspecified severity, with other behavioral disturbance: Secondary | ICD-10-CM | POA: Diagnosis present

## 2016-09-08 DIAGNOSIS — Z79899 Other long term (current) drug therapy: Secondary | ICD-10-CM | POA: Diagnosis not present

## 2016-09-08 DIAGNOSIS — F29 Unspecified psychosis not due to a substance or known physiological condition: Secondary | ICD-10-CM | POA: Diagnosis not present

## 2016-09-08 DIAGNOSIS — I252 Old myocardial infarction: Secondary | ICD-10-CM | POA: Insufficient documentation

## 2016-09-08 DIAGNOSIS — F0151 Vascular dementia with behavioral disturbance: Secondary | ICD-10-CM | POA: Diagnosis not present

## 2016-09-08 DIAGNOSIS — Z7901 Long term (current) use of anticoagulants: Secondary | ICD-10-CM | POA: Diagnosis not present

## 2016-09-08 DIAGNOSIS — F4489 Other dissociative and conversion disorders: Secondary | ICD-10-CM | POA: Diagnosis not present

## 2016-09-08 DIAGNOSIS — Z85528 Personal history of other malignant neoplasm of kidney: Secondary | ICD-10-CM | POA: Insufficient documentation

## 2016-09-08 DIAGNOSIS — Z87891 Personal history of nicotine dependence: Secondary | ICD-10-CM | POA: Insufficient documentation

## 2016-09-08 DIAGNOSIS — I110 Hypertensive heart disease with heart failure: Secondary | ICD-10-CM | POA: Diagnosis not present

## 2016-09-08 DIAGNOSIS — F309 Manic episode, unspecified: Secondary | ICD-10-CM | POA: Diagnosis not present

## 2016-09-08 DIAGNOSIS — R4182 Altered mental status, unspecified: Secondary | ICD-10-CM | POA: Diagnosis not present

## 2016-09-08 DIAGNOSIS — Z9889 Other specified postprocedural states: Secondary | ICD-10-CM | POA: Diagnosis not present

## 2016-09-08 DIAGNOSIS — Z7982 Long term (current) use of aspirin: Secondary | ICD-10-CM | POA: Insufficient documentation

## 2016-09-08 DIAGNOSIS — R45850 Homicidal ideations: Secondary | ICD-10-CM | POA: Diagnosis not present

## 2016-09-08 DIAGNOSIS — Z9049 Acquired absence of other specified parts of digestive tract: Secondary | ICD-10-CM | POA: Diagnosis not present

## 2016-09-08 DIAGNOSIS — Z833 Family history of diabetes mellitus: Secondary | ICD-10-CM | POA: Diagnosis not present

## 2016-09-08 HISTORY — DX: Unspecified dementia, unspecified severity, without behavioral disturbance, psychotic disturbance, mood disturbance, and anxiety: F03.90

## 2016-09-08 LAB — BASIC METABOLIC PANEL
Anion gap: 6 (ref 5–15)
BUN: 26 mg/dL — AB (ref 6–20)
CHLORIDE: 105 mmol/L (ref 101–111)
CO2: 27 mmol/L (ref 22–32)
CREATININE: 0.92 mg/dL (ref 0.61–1.24)
Calcium: 9.5 mg/dL (ref 8.9–10.3)
GFR calc Af Amer: >60 mL/min (ref >60)
GFR calc non Af Amer: >60 mL/min (ref >60)
Glucose, Bld: 138 mg/dL — ABNORMAL HIGH (ref 65–99)
Potassium: 3.5 mmol/L (ref 3.5–5.1)
SODIUM: 138 mmol/L (ref 135–145)

## 2016-09-08 LAB — CBC WITH DIFFERENTIAL/PLATELET
Basophils Absolute: 0 10*3/uL (ref 0.0–0.1)
Basophils Relative: 0 %
EOS ABS: 0.3 10*3/uL (ref 0.0–0.7)
Eosinophils Relative: 5 %
HCT: 33 % — ABNORMAL LOW (ref 39.0–52.0)
Hemoglobin: 11.3 g/dL — ABNORMAL LOW (ref 13.0–17.0)
LYMPHS ABS: 2 10*3/uL (ref 0.7–4.0)
Lymphocytes Relative: 29 %
MCH: 29 pg (ref 26.0–34.0)
MCHC: 34.2 g/dL (ref 30.0–36.0)
MCV: 84.6 fL (ref 78.0–100.0)
Monocytes Absolute: 0.4 10*3/uL (ref 0.1–1.0)
Monocytes Relative: 6 %
Neutro Abs: 4.3 10*3/uL (ref 1.7–7.7)
Neutrophils Relative %: 60 %
Platelets: 159 10*3/uL (ref 150–400)
RBC: 3.9 MIL/uL — ABNORMAL LOW (ref 4.22–5.81)
RDW: 12.7 % (ref 11.5–15.5)
WBC: 7.1 10*3/uL (ref 4.0–10.5)

## 2016-09-08 LAB — URINALYSIS, ROUTINE W REFLEX MICROSCOPIC
BILIRUBIN URINE: NEGATIVE
Glucose, UA: NEGATIVE mg/dL
HGB URINE DIPSTICK: NEGATIVE
Ketones, ur: NEGATIVE mg/dL
Leukocytes, UA: NEGATIVE
Nitrite: NEGATIVE
Protein, ur: NEGATIVE mg/dL
SPECIFIC GRAVITY, URINE: 1.013 (ref 1.005–1.030)
pH: 6 (ref 5.0–8.0)

## 2016-09-08 NOTE — ED Notes (Signed)
No respiratory or acute distress noted resting in bed with eyes closed call light in reach family in conference room talking with ED provider no reaction to medication noted.

## 2016-09-08 NOTE — ED Provider Notes (Signed)
  Face-to-face evaluation   History: He presents for aggressive behavior, which is worsening. He is threatening to harm family members, including "taking them out with a knife". He has been evaluated recently in the emergency department by psychiatry who felt that he had dementia. Since that time he has been seen by neurology who is beginning a workup to include EEG and MRI. However, these have not yet been done. 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  Physical exam: Alert, elderly man. He is confused. He is unaware why he is here or the situation that happened earlier today. He is not in respiratory distress. He does not appear to be responding to internal stimuli.  Assessment- worsening behavioral disorder, likely related to dementia, however, there may be a component of paranoia, possibly indicating a psychiatric condition. He will require evaluation by TTS, prior to considering additional medications or discharge  Medical screening examination/treatment/procedure(s) were conducted as a shared visit with non-physician practitioner(s) and myself.  I personally evaluated the patient during the encounter   Daleen Bo, MD 09/10/16 [phone]

## 2016-09-08 NOTE — ED Provider Notes (Signed)
[name] DEPT Provider Note   CSN: [id number] Arrival date & time: 09/08/16  1906  By signing my name below, I, Gwenlyn Fudge, attest that this documentation has been prepared under the direction and in the presence of [facility], PA-C. Electronically Signed: Gwenlyn Fudge, ED Scribe. 09/08/16. 9:23 PM.   History   Chief Complaint Chief Complaint  Patient presents with  . Manic Behavior   The history is provided by the patient and a relative (Daughter, Son, and Daughter-in-law). No language interpreter was used.   HPI Comments: [name] is a 83 y.o. male with PMHx of Dementia who presents to the Emergency Department for evaluation of manic behavior at home today. Pt states he feels fine now and is unaware of why he is here tonight. Pt lives at home with only his wife.  He denies any complaints at this time. Level V caveat applies due to dementia.   Per EMS, patient very combative upon their arrival. 5 mg of Versed given IM en route.  Per family, for about 2-3 weeks, pt is having days of erratic behavior, getting combative and angry. He often makes comments such as "I will kick your teeth out.", "Good thing I don't have a knife." and other idle threats. Other days, patient is fine and shows no aggression or agitation. Son and daughter are afraid for their mother's safety due to the pt's anger. Pt is convinced that wife, daughter and son are colluding against him. Family tried utilizing a Actuary, but he become agitated with her. Pt was seen in ED by a psychologist a few weeks ago for a similar complaint and told that pt had dementia. He then followed up with urology as directed who diagnosed the pt with vascular dementia. He did not receive any medications from Neurologist during his appointment. Neurology had planned for outpatient EEG and MRI, unfortunately this has not been done yet. Patient was supposed to have MRI done tomorrow, but they are awaiting insurance approval before in  image can get done. EEG has been set up for March. Family states that they are worried that he is no longer safe at home and also concerned for wife's safety if they do indeed bring him home.  Past Medical History:  Diagnosis Date  . Aortic valve replaced   . Arthritis   . Atrial fibrillation ([place])    longterm persistent  . Back pain, chronic    SINCE BACK INJURY / FRACTURE  . BPH (benign prostatic hyperplasia)    grapey  . CAD (coronary artery disease)   . Cancer (HCC)    RIGHT RENAL  . Constipation   . Cough 06/16/12   C/O OF COUGH / COLD FOR A COUPLE OF WEEKS  . Dementia   . Diverticulitis    history  . Elevated fasting glucose 2012  . GERD (gastroesophageal reflux disease)   . HTN (hypertension)   . Hyperlipidemia   . Hypokalemia   . Kidney disorder    ablation defect in the right kidney lower pole but progression of interpolar lesion: stable on repeat CT (06/2014)  . Right renal mass    renal cell carcinoma  . Shortness of breath    PT STATES RELATED TO HIS AF AND HEART BEING OUT OF RHYTHM  . Stomach ulcer     Patient Active Problem List   Diagnosis Date Noted  . Dementia with behavioral disturbance 08/23/2016  . Right renal mass   . Renal carcinoma ([place])   . Spastic  bladder 10/05/2014  . Encounter for therapeutic drug monitoring 11/06/2013  . Acute NSTEMI (non-ST elevated myocardial infarction) 09/26/2013  . Chronic calculous cholecystitis 09/08/2013  . Acalculous cholecystitis 09/04/2013  . Abdominal pain, other specified site 09/01/2013  . Constipation 09/01/2013  . Hypotension 09/01/2013  . Leukocytosis 09/01/2013  . Normocytic anemia 09/01/2013  . Hyponatremia 09/01/2013  . Bruit 04/21/2013  . Long term (current) use of anticoagulants 11/22/2010  . Chronic diastolic heart failure ([place]) 11/03/2010  . Aortic valve disorder 10/23/2010  . EDEMA 04/26/2010  . DYSPNEA 04/26/2010  . CHEST PAIN 04/26/2010  . Elevated lipids 01/21/2009  . HYPOKALEMIA  01/21/2009  . Essential hypertension 01/21/2009  . Coronary atherosclerosis 01/21/2009  . Atrial fibrillation ([place]) 01/21/2009  . ALTERED MENTAL STATUS 01/21/2009  . AORTIC VALVE REPLACEMENT, HX OF 01/21/2009    Past Surgical History:  Procedure Laterality Date  . AORTIC VALVE REPLACEMENT  06/16/2002   St Jude Regent Valve - OK for 1.5T or 3T, Normal SAR, < 3000 gauss/cm spatial gradient - S. Draughon RT MRSO  . CHOLECYSTECTOMY N/A 09/08/2013   Procedure: LAPAROSCOPIC CHOLECYSTECTOMY WITH INTRAOPERATIVE CHOLANGIOGRAM;  Surgeon: Odis Hollingshead, MD;  Location: [facility] ORS;  Service: General;  Laterality: N/A;  . CORONARY ARTERY BYPASS GRAFT    . KNEE SURGERY    . LEFT HEART CATHETERIZATION WITH CORONARY/GRAFT ANGIOGRAM N/A 09/07/2013   Procedure: LEFT HEART CATHETERIZATION WITH Beatrix Fetters;  Surgeon: Sinclair Grooms, MD;  Location: [facility] CATH LAB;  Service: Cardiovascular;  Laterality: N/A;  . RADIOFREQUENCY ABLATION KIDNEY     RIGHT (two surgeries)  . Thoracic T12 compression fracture       Home Medications    Prior to Admission medications   Medication Sig Start Date End Date Taking? Authorizing Provider  aspirin EC 81 MG EC tablet Take 1 tablet (81 mg total) by mouth daily. 09/11/13   Janece Canterbury, MD  atorvastatin (LIPITOR) 40 MG tablet Take 40 mg by mouth daily. 06/14/15   Historical Provider, MD  carvedilol (COREG) 25 MG tablet TAKE 1 TABLET BY MOUTH TWICE DAILY WITH A MEAL 08/06/16   Josue Hector, MD  digoxin (DIGOX) 0.125 MG tablet Take 1 tablet (125 mcg total) by mouth daily. 05/16/16   Josue Hector, MD  escitalopram (LEXAPRO) 10 MG tablet Take 10 mg by mouth daily.    Historical Provider, MD  nitroGLYCERIN (NITROSTAT) 0.4 MG SL tablet Place 1 tablet (0.4 mg total) under the tongue every 5 (five) minutes as needed. For chest pain 10/20/15   Josue Hector, MD  oxybutynin (DITROPAN-XL) 10 MG 24 hr tablet Take 10 mg by mouth every other day.     Historical Provider,  MD  potassium chloride (KLOR-CON) 8 MEQ tablet TAKE 1 TABLET BY MOUTH EVERY DAY 12/06/15   Josue Hector, MD  ramipril (ALTACE) 10 MG capsule Take 10 mg by mouth 2 (two) times daily.    Historical Provider, MD  ramipril (ALTACE) 5 MG capsule Take 5 mg by mouth daily.    Historical Provider, MD  Tamsulosin HCl (FLOMAX) 0.4 MG CAPS Take 0.4 mg by mouth daily.     Historical Provider, MD  torsemide (DEMADEX) 10 MG tablet TAKE 1 TABLET BY MOUTH TWICE DAILY Patient taking differently: TAKE 1 TABLET BY MOUTH DAILY 12/05/15   Josue Hector, MD  vitamin B-12 (CYANOCOBALAMIN) 1000 MCG tablet Take 1,000 mcg by mouth daily.      Historical Provider, MD  Vitamin D, Cholecalciferol, 400 units TABS  Take 1 tablet by mouth daily.    Historical Provider, MD  warfarin (COUMADIN) 5 MG tablet Take as directed by coumadin clinic 08/28/16   Josue Hector, MD   Family History Family History  Problem Relation Age of Onset  . Hypertension Mother   . Diabetes Mother   . Hypertension Sister   . Diabetes Sister   . Hypertension Brother   . Diabetes Brother    Social History Social History  Substance Use Topics  . Smoking status: Former Smoker    Packs/day: 2.00    Years: 20.00    Types: Cigarettes    Quit date: 08/27/1977  . Smokeless tobacco: Never Used  . Alcohol use No   Allergies   Patient has no known allergies.  Review of Systems Review of Systems  Unable to perform ROS: Dementia  Psychiatric/Behavioral: Positive for agitation, behavioral problems and sleep disturbance.   Physical Exam Updated Vital Signs BP 158/79   Pulse 95   Temp 98 F (36.7 C) (Oral)   Resp 20   Ht 5\' 10"  (1.778 m)   Wt 211 lb (95.7 kg)   SpO2 97%   BMI 30.28 kg/m   Physical Exam  Constitutional: He appears well-developed and well-nourished. No distress.  Pleasantly confused, resting comfortably at this time.  HENT:  Head: Normocephalic and atraumatic.  Cardiovascular: Normal rate, regular rhythm and normal heart  sounds.   No murmur heard. Pulmonary/Chest: Effort normal and breath sounds normal. No respiratory distress.  Abdominal: Soft. He exhibits no distension. There is no tenderness.  Neurological:  Alert to self only. Follow simple commands.   Skin: Skin is warm and dry.  Nursing note and vitals reviewed.  ED Treatments / Results  DIAGNOSTIC STUDIES: Oxygen Saturation is 97% on RA, normal by my interpretation.    COORDINATION OF CARE: 9:12 PM Discussed treatment plan with family   Labs (all labs ordered are listed, but only abnormal results are displayed) Labs Reviewed - No data to display  EKG  EKG Interpretation None       Radiology No results found.  Procedures Procedures (including critical care time)  Medications Ordered in ED Medications - No data to display   Initial Impression / Assessment and Plan / ED Course  I have reviewed the triage vital signs and the nursing notes.  Pertinent labs & imaging results that were available during my care of the patient were reviewed by me and considered in my medical decision making (see chart for details).  Clinical Course as of Sep 09 28  Sun Sep 09, 2016  0029 High  Glucose: (!) 138 [EW]  0029 Low Hemoglobin: (!) 11.3 [EW]  0030 At this time, he is medically cleared for treatment by psychiatry.  [EW]    Clinical Course User Index [EW] Daleen Bo, MD   NEYTHAN CAPERTON is a 83 y.o. male who presents to ED for evaluation of combative behavior in the setting of vascular dementia. Patient is from home where he lives with his elderly wife. Patient's children at bedside and are afraid he is not safe at home. They are also concerned for the safety of his wife given his combative outbreaks. On exam, patient is alert to self only. He is pleasant and resting comfortably at the time of my exam, however he did receive 5mg  of versed en route. A significant amount of time was taken to discuss patient's wishes and disposition with  the family. Patient is followed by neurology and has  outpatient workup pending. I do believe that he requires more care than family can provide given worsening of unstable behavioral disturbances. Labs reviewed and reassuring. UA without signs of infection. Medically cleared and TTS consulted. TTS recommends inpatient geri-psych. Patient awaiting placement.   Patient seen by and discussed with Dr. Eulis Foster who agrees with treatment plan.    Final Clinical Impressions(s) / ED Diagnoses   Final diagnoses:  None    New Prescriptions New Prescriptions   No medications on file   I personally performed the services described in this documentation, which was scribed in my presence. The recorded information has been reviewed and is accurate.      [facility] Junior Kenedy, PA-C 09/09/16 [place], MD 09/10/16 [phone]

## 2016-09-08 NOTE — BH Assessment (Signed)
Tele Assessment Note   [name] is an 83 y.o. male who presents to the ED BIB his son and daughter. When pt was asked why he was in the hospital he stated, "i'll be dammed if I know." Pt reports he is unsure why he came to the hospital and states he is angry with his son and daughter for bringing him to the hospital. Pt appeared partially disoriented during the assessment as he was not able to accurately identify who he lives with at home stating "I think it's my wife but I don't know." Pt was able to identify his career history and reports he retired from [employer] about 5 years ago. Pt was able to accurately identify his children by name and age.    Pt denies SI , or HI. According to reports from the pt's family, he has been increasingly aggressive at home and making threats to the family. While speaking with the pt's daughter, she reports the pt was assessed several weeks ago and was d/c home after a few days in the ED. [name] reports he has gotten worse since coming home and has fits of anger where he lashes out at the family. [name] reports she is fearful of her father due to the threats he has made. Pt's son reports the pt made a threat to him today while at the hospital and stated "your time is coming." Pt's son and daughter report they fear for their mother's safety in the home with the pt due to the threats he has made. [name] reports she has concerns due to the fact that the pt was assessed several weeks ago and was sent home. Pt's son reports the pt does not show symptomology while in the hospital therefore he is d/c but when he comes home, his aggression and anger worsen and become threatening for the family.   Pt appeared to be experiencing a flight of ideas in his thought processes. Pt was asked if he has access to weapons and he responded by saying "no, I wouldn't trust myself with them. They give me a shit ass job." Pt was asked if he would like water and  he stated "if I can get some between now and [place]." Pt stated "I thought we were all getting along until I woke up in a damn hospital."   Per Lindon Romp, FNP pt meets criteria for inpt treatment and recommends gero-psych treatment. Case discussed with Eulis Foster, MD who is in agreement with the pt receiving gero-psych treatment.     Diagnosis: Unspecified Delusional Disorder H/o Dementia   Past Medical History:  Past Medical History:  Diagnosis Date   Aortic valve replaced    Arthritis    Atrial fibrillation ([place])    longterm persistent   Back pain, chronic    SINCE BACK INJURY / FRACTURE   BPH (benign prostatic hyperplasia)    grapey   CAD (coronary artery disease)    Cancer ([place])    RIGHT RENAL   Constipation    Cough 06/16/12   C/O OF COUGH / COLD FOR A COUPLE OF WEEKS   Dementia    Diverticulitis    history   Elevated fasting glucose 2012   GERD (gastroesophageal reflux disease)    HTN (hypertension)    Hyperlipidemia    Hypokalemia    Kidney disorder    ablation defect in the right kidney lower pole but progression of interpolar lesion: stable on repeat CT (06/2014)  Right renal mass    renal cell carcinoma   Shortness of breath    PT STATES RELATED TO HIS AF AND HEART BEING OUT OF RHYTHM   Stomach ulcer     Past Surgical History:  Procedure Laterality Date   AORTIC VALVE REPLACEMENT  06/16/2002   St Jude Regent Valve - OK for 1.5T or 3T, Normal SAR, < 3000 gauss/cm spatial gradient - S. Draughon RT MRSO   CHOLECYSTECTOMY N/A 09/08/2013   Procedure: LAPAROSCOPIC CHOLECYSTECTOMY WITH INTRAOPERATIVE CHOLANGIOGRAM;  Surgeon: Odis Hollingshead, MD;  Location: [facility]S;  Service: General;  Laterality: N/A;   CORONARY ARTERY BYPASS GRAFT     KNEE SURGERY     LEFT HEART CATHETERIZATION WITH CORONARY/GRAFT ANGIOGRAM N/A 09/07/2013   Procedure: LEFT HEART CATHETERIZATION WITH Beatrix Fetters;  Surgeon: Sinclair Grooms, MD;  Location:  [facility] CATH LAB;  Service: Cardiovascular;  Laterality: N/A;   RADIOFREQUENCY ABLATION KIDNEY     RIGHT (two surgeries)   Thoracic T12 compression fracture      Family History:  Family History  Problem Relation Age of Onset   Hypertension Mother    Diabetes Mother    Hypertension Sister    Diabetes Sister    Hypertension Brother    Diabetes Brother     Social History:  reports that he quit smoking about 39 years ago. His smoking use included Cigarettes. He has a 40.00 pack-year smoking history. He has never used smokeless tobacco. He reports that he does not drink alcohol or use drugs.  Additional Social History:  Alcohol / Drug Use Pain Medications: See PTA meds  Prescriptions: See PTA meds  Over the Counter: See PTA meds  History of alcohol / drug use?: No history of alcohol / drug abuse  CIWA: CIWA-Ar BP: 150/88 Pulse Rate: 95 COWS:    PATIENT STRENGTHS: (choose at least two) Communication skills Supportive family/friends  Allergies: No Known Allergies  Home Medications:  (Not in a hospital admission)  OB/GYN Status:  No LMP for male patient.  General Assessment Data [facility] of Assessment: [facility] ED TTS Assessment: In system Is this a Tele or Face-to-Face Assessment?: Face-to-Face Is this an Initial Assessment or a Re-assessment for this encounter?: Initial Assessment Marital status: Married Is patient pregnant?: No Pregnancy Status: No Living Arrangements: Spouse/significant other Can pt return to current living arrangement?: Yes Admission Status: Voluntary Is patient capable of signing voluntary admission?: Yes Referral Source: Self/Family/Friend Insurance type: Equities trader     Crisis Care Plan Living Arrangements: Spouse/significant other Name of Psychiatrist: None Name of Therapist: None  Education Status Is patient currently in school?: No Highest grade of school patient has completed: 12th  Risk to self with the past 6  months Suicidal Ideation: No Has patient been a risk to self within the past 6 months prior to admission? : No Suicidal Intent: No Has patient had any suicidal intent within the past 6 months prior to admission? : No Is patient at risk for suicide?: No Suicidal Plan?: No Has patient had any suicidal plan within the past 6 months prior to admission? : No Access to Means: No What has been your use of drugs/alcohol within the last 12 months?: denies Previous Attempts/Gestures: No Triggers for Past Attempts: None known Intentional Self Injurious Behavior: None Family Suicide History: No Recent stressful life event(s): Conflict (Comment), Recent negative physical changes (pt reports conflict with children and wife) Persecutory voices/beliefs?: No Depression: No Depression Symptoms: Feeling angry/irritable Substance abuse history and/or treatment  for substance abuse?: No Suicide prevention information given to non-admitted patients: Not applicable  Risk to Others within the past 6 months Homicidal Ideation: Yes-Currently Present (pt denies, family reports pt makes threats to kill them ) Does patient have any lifetime risk of violence toward others beyond the six months prior to admission? : No Thoughts of Harm to Others: Yes-Currently Present Comment - Thoughts of Harm to Others: pt denies, however in reviewing collateral contacts the pt's daughter and son report the pt has made threats to kill them  Current Homicidal Intent: No Current Homicidal Plan: No Access to Homicidal Means: No History of harm to others?: No Assessment of Violence: None Noted Does patient have access to weapons?: No Criminal Charges Pending?: No Does patient have a court date: No Is patient on probation?: No  Psychosis Hallucinations: None noted Delusions: Unspecified  Mental Status Report Appearance/Hygiene: Bizarre Eye Contact: Good Motor Activity: Freedom of movement, Tremors Speech: Logical/coherent,  Aggressive Level of Consciousness: Alert Mood: Anxious, Angry Affect: Angry, Irritable Anxiety Level: Minimal Thought Processes: Flight of Ideas Judgement: Impaired Orientation: Person Obsessive Compulsive Thoughts/Behaviors: None  Cognitive Functioning Concentration: Fair Memory: Recent Impaired, Remote Impaired IQ: Average Insight: Poor Impulse Control: Poor Appetite: Poor Sleep: Decreased Total Hours of Sleep: 4 Vegetative Symptoms: None  ADLScreening [facility] Assessment Services) Patient's cognitive ability adequate to safely complete daily activities?: No Patient able to express need for assistance with ADLs?: Yes Independently performs ADLs?: Yes (appropriate for developmental age)  Prior Inpatient Therapy Prior Inpatient Therapy: No  Prior Outpatient Therapy Prior Outpatient Therapy: No Does patient have an ACCT team?: No Does patient have Intensive In-House Services?  : No Does patient have [facility] services? : No Does patient have P4CC services?: No  ADL Screening (condition at time of admission) Patient's cognitive ability adequate to safely complete daily activities?: No Is the patient deaf or have difficulty hearing?: Yes Does the patient have difficulty seeing, even when wearing glasses/contacts?: No Does the patient have difficulty concentrating, remembering, or making decisions?: Yes Patient able to express need for assistance with ADLs?: Yes Does the patient have difficulty dressing or bathing?: No Independently performs ADLs?: Yes (appropriate for developmental age) Does the patient have difficulty walking or climbing stairs?: No Weakness of Legs: None Weakness of Arms/Hands: None  Home Assistive Devices/Equipment Home Assistive Devices/Equipment: Eyeglasses    Abuse/Neglect Assessment (Assessment to be complete while patient is alone) Physical Abuse: Denies Verbal Abuse: Denies Sexual Abuse: Denies Exploitation of patient/patient's resources:  Denies Self-Neglect: Denies     Regulatory affairs officer (For Healthcare) Does Patient Have a Medical Advance Directive?: No Would patient like information on creating a medical advance directive?: No - Patient declined    Additional Information 1:1 In Past 12 Months?: No CIRT Risk: No Elopement Risk: No Does patient have medical clearance?: Yes     Disposition:  Disposition Initial Assessment Completed for this Encounter: Yes Disposition of Patient: Inpatient treatment program Type of inpatient treatment program: Adult (gero-psych)  Lyanne Co 09/08/2016 10:57 PM

## 2016-09-08 NOTE — ED Notes (Signed)
Bed: WA01 Expected date:  Expected time:  Means of arrival:  Comments: 83 yo aggressive behavior

## 2016-09-08 NOTE — ED Triage Notes (Signed)
From home family states patient aggressive and unable to cope with it brought to ER for evaluation.  Threatening wife verbally.  Dx with dementia in December no sleeping at home.

## 2016-09-08 NOTE — ED Notes (Signed)
Informed Ilene RN charge nurse about need for sitter for dementia pt trying to wonder off.

## 2016-09-08 NOTE — ED Triage Notes (Signed)
Versed 5 mg given IM per EMS in route.

## 2016-09-08 NOTE — ED Notes (Signed)
No respiratory or acute distress noted resting in bed with eyes closed call light in reach. 

## 2016-09-08 NOTE — ED Notes (Signed)
Pt refuses to let nurse get his vital signs at this time.

## 2016-09-09 ENCOUNTER — Other Ambulatory Visit: Payer: Self-pay

## 2016-09-09 ENCOUNTER — Emergency Department (HOSPITAL_COMMUNITY): Payer: PPO

## 2016-09-09 DIAGNOSIS — R4182 Altered mental status, unspecified: Secondary | ICD-10-CM | POA: Diagnosis not present

## 2016-09-09 DIAGNOSIS — Z833 Family history of diabetes mellitus: Secondary | ICD-10-CM

## 2016-09-09 DIAGNOSIS — Z9889 Other specified postprocedural states: Secondary | ICD-10-CM

## 2016-09-09 DIAGNOSIS — Z9049 Acquired absence of other specified parts of digestive tract: Secondary | ICD-10-CM

## 2016-09-09 DIAGNOSIS — R45850 Homicidal ideations: Secondary | ICD-10-CM | POA: Diagnosis not present

## 2016-09-09 DIAGNOSIS — Z7982 Long term (current) use of aspirin: Secondary | ICD-10-CM | POA: Diagnosis not present

## 2016-09-09 DIAGNOSIS — F0151 Vascular dementia with behavioral disturbance: Secondary | ICD-10-CM | POA: Diagnosis not present

## 2016-09-09 DIAGNOSIS — Z87891 Personal history of nicotine dependence: Secondary | ICD-10-CM

## 2016-09-09 DIAGNOSIS — Z8249 Family history of ischemic heart disease and other diseases of the circulatory system: Secondary | ICD-10-CM

## 2016-09-09 DIAGNOSIS — Z79899 Other long term (current) drug therapy: Secondary | ICD-10-CM | POA: Diagnosis not present

## 2016-09-09 DIAGNOSIS — F0391 Unspecified dementia with behavioral disturbance: Secondary | ICD-10-CM | POA: Diagnosis not present

## 2016-09-09 LAB — PROTIME-INR
INR: 1.94
Prothrombin Time: 22.4 seconds — ABNORMAL HIGH (ref 11.4–15.2)

## 2016-09-09 LAB — TSH: TSH: 1.866 u[IU]/mL (ref 0.350–4.500)

## 2016-09-09 MED ORDER — WARFARIN SODIUM 10 MG PO TABS
10.0000 mg | ORAL_TABLET | Freq: Once | ORAL | Status: AC
  Administered 2016-09-09: 10 mg via ORAL
  Filled 2016-09-09: qty 1

## 2016-09-09 MED ORDER — WARFARIN - PHARMACIST DOSING INPATIENT: Freq: Every day | Status: DC

## 2016-09-09 MED ORDER — TAMSULOSIN HCL 0.4 MG PO CAPS
0.4000 mg | ORAL_CAPSULE | Freq: Every day | ORAL | Status: DC
  Administered 2016-09-09: 0.4 mg via ORAL
  Filled 2016-09-09: qty 1

## 2016-09-09 MED ORDER — CARVEDILOL 25 MG PO TABS
25.0000 mg | ORAL_TABLET | Freq: Two times a day (BID) | ORAL | Status: DC
  Administered 2016-09-09 (×2): 25 mg via ORAL
  Filled 2016-09-09 (×3): qty 1

## 2016-09-09 MED ORDER — RAMIPRIL 5 MG PO CAPS: 5.0000 mg | ORAL_CAPSULE | Freq: Every day | ORAL | Status: DC

## 2016-09-09 MED ORDER — ASPIRIN EC 81 MG PO TBEC
81.0000 mg | DELAYED_RELEASE_TABLET | Freq: Every day | ORAL | Status: DC
  Administered 2016-09-09: 81 mg via ORAL
  Filled 2016-09-09: qty 1

## 2016-09-09 MED ORDER — POTASSIUM CHLORIDE CRYS ER 10 MEQ PO TBCR
10.0000 meq | EXTENDED_RELEASE_TABLET | Freq: Every day | ORAL | Status: DC
  Administered 2016-09-09: 10 meq via ORAL
  Filled 2016-09-09: qty 1

## 2016-09-09 MED ORDER — LORAZEPAM 2 MG/ML IJ SOLN
1.0000 mg | Freq: Once | INTRAMUSCULAR | Status: DC
  Administered 2016-09-09: 1 mg via INTRAMUSCULAR
  Filled 2016-09-09: qty 1

## 2016-09-09 MED ORDER — ESCITALOPRAM OXALATE 10 MG PO TABS
10.0000 mg | ORAL_TABLET | Freq: Every day | ORAL | Status: DC
  Administered 2016-09-09: 10 mg via ORAL
  Filled 2016-09-09: qty 1

## 2016-09-09 MED ORDER — TORSEMIDE 10 MG PO TABS
10.0000 mg | ORAL_TABLET | Freq: Two times a day (BID) | ORAL | Status: DC
  Administered 2016-09-09: 10 mg via ORAL
  Filled 2016-09-09 (×3): qty 1

## 2016-09-09 MED ORDER — DIGOXIN 125 MCG PO TABS
125.0000 ug | ORAL_TABLET | Freq: Every day | ORAL | Status: DC
  Administered 2016-09-09: 125 ug via ORAL
  Filled 2016-09-09: qty 1

## 2016-09-09 MED ORDER — QUETIAPINE FUMARATE 25 MG PO TABS
12.5000 mg | ORAL_TABLET | Freq: Two times a day (BID) | ORAL | Status: DC
  Administered 2016-09-09: 12.5 mg via ORAL
  Filled 2016-09-09: qty 1

## 2016-09-09 MED ORDER — OXYBUTYNIN CHLORIDE ER 10 MG PO TB24
10.0000 mg | ORAL_TABLET | ORAL | Status: DC
  Administered 2016-09-09: 10 mg via ORAL
  Filled 2016-09-09: qty 1

## 2016-09-09 MED ORDER — ATORVASTATIN CALCIUM 40 MG PO TABS
40.0000 mg | ORAL_TABLET | Freq: Every evening | ORAL | Status: DC
  Administered 2016-09-09: 40 mg via ORAL
  Filled 2016-09-09: qty 1

## 2016-09-09 MED ORDER — RAMIPRIL 10 MG PO CAPS
10.0000 mg | ORAL_CAPSULE | Freq: Two times a day (BID) | ORAL | Status: DC
  Administered 2016-09-09: 10 mg via ORAL
  Filled 2016-09-09 (×3): qty 1

## 2016-09-09 MED ORDER — WARFARIN SODIUM 5 MG PO TABS: 5.0000 mg | ORAL_TABLET | Freq: Once | ORAL | Status: DC

## 2016-09-09 MED ORDER — LORAZEPAM 2 MG/ML IJ SOLN
1.0000 mg | Freq: Once | INTRAMUSCULAR | Status: AC
  Administered 2016-09-09: 1 mg via INTRAMUSCULAR
  Filled 2016-09-09: qty 1

## 2016-09-09 NOTE — Progress Notes (Signed)
CSW contacted [facility]. Staff confirmed IVC received via fax. Staff reported that patient's RN can now call report.

## 2016-09-09 NOTE — Progress Notes (Signed)
ANTICOAGULATION CONSULT NOTE - Initial Consult  Pharmacy Consult for warfarin Indication: atrial fibrillation  No Known Allergies  Patient Measurements: Height: 5\' 10"  (177.8 cm) Weight: 211 lb (95.7 kg) IBW/kg (Calculated) : 73   Vital Signs: Temp: 97.5 F (36.4 C) (01/14 0054) Temp Source: Oral (01/14 0054) BP: 152/62 (01/14 0054) Pulse Rate: 62 (01/14 0054)  Labs:  Recent Labs  09/08/16 2223 09/09/16 0758  HGB 11.3*  --   HCT 33.0*  --   PLT 159  --   LABPROT  --  22.4*  INR  --  1.94  CREATININE 0.92  --     Estimated Creatinine Clearance: 74.4 mL/min (by C-G formula based on SCr of 0.92 mg/dL).   Medical History: Past Medical History:  Diagnosis Date  . Aortic valve replaced   . Arthritis   . Atrial fibrillation ([place])    longterm persistent  . Back pain, chronic    SINCE BACK INJURY / FRACTURE  . BPH (benign prostatic hyperplasia)    grapey  . CAD (coronary artery disease)   . Cancer (HCC)    RIGHT RENAL  . Constipation   . Cough 06/16/12   C/O OF COUGH / COLD FOR A COUPLE OF WEEKS  . Dementia   . Diverticulitis    history  . Elevated fasting glucose 2012  . GERD (gastroesophageal reflux disease)   . HTN (hypertension)   . Hyperlipidemia   . Hypokalemia   . Kidney disorder    ablation defect in the right kidney lower pole but progression of interpolar lesion: stable on repeat CT (06/2014)  . Right renal mass    renal cell carcinoma  . Shortness of breath    PT STATES RELATED TO HIS AF AND HEART BEING OUT OF RHYTHM  . Stomach ulcer     Medications:   (Not in a hospital admission)  Assessment: 83yo M with dementia presents from home with manic and aggressive behavior. Takes warfarin for A.fib. INR is slightly subtherapeutic with reported compliance on home regimen: 7.5mg  daily except 10mg  on Sundays, Tuesdays, and Thursdays. Pharmacy asked to manage.  Goal of Therapy:  INR 2-3 Monitor platelets by anticoagulation protocol: Yes    Plan:  Give Coumadin 10mg  x 1 today.  Check PT/INR daily. F/u disposition.  Romeo Rabon, PharmD, pager [phone]. 09/09/2016,10:42 AM.

## 2016-09-09 NOTE — ED Notes (Signed)
Called Shanon Brow at [facility] requesting IVC papers first and ability to speak to MD prior to taking report.  Gave number and asked for return call when report able to be given.

## 2016-09-09 NOTE — ED Notes (Signed)
Called [facility] for IVC transport [facility] in [place].

## 2016-09-09 NOTE — BHH Counselor (Signed)
[name], [name] called to check in on pt and receive an update on how the pt has been doing. Provided the pt's daughter with a review of how the pt has been doing based on the chart and the assessment. [name] was provided with support and encouragement. [name] verbalized understanding and expressed a desire to visit the pt during visiting hours. [name] was transferred to the main ED in order to review the visitation hours and schedule.  Lind Covert, MSW, Latanya Presser

## 2016-09-09 NOTE — ED Notes (Signed)
Attempted to give patient his medications.  Patient stood up from the bed stated "you are a damn liar"  I advised him that I had medications for him and he stated "no you don't I am not taking anything from you"  I attempted to try and offer the patient a drink and he stated "I am not drinking that get that away from me"  I asked him if he would like something different to drink and he stated "I would like to have something to drink that starts with a k"

## 2016-09-09 NOTE — ED Notes (Signed)
Pt ambulated to bathroom with assistance.  Unsteady on feet.  Sitter at bedside.  Pt was sitting on side of bed.  Attempted to stand to scoot back in bed.  Pt feet slipped and patient landed on backside.  Pt denies any pain.  Immediately able to assist to stand and placed back to bed.  Notified charge RN, Manuela Schwartz of fall.  Safety report completed.  Notified Dr. Oleta Mouse of fall.  Continue to monitor for any changes in condition.  Safety plan evaluated.  Sitter to remain with patient until patient is supine.  Reinforced with patient to not try to stand without assistance.

## 2016-09-09 NOTE — Consult Note (Signed)
[facility] Psychiatry Consult   Reason for Consult:  Dementia with agitation Referring Physician:  EDP Patient Identification: MATIN MATTIOLI MRN:  [id number] Principal Diagnosis: Dementia with behavioral disturbance Diagnosis:   Patient Active Problem List   Diagnosis Date Noted  . Dementia with behavioral disturbance [F03.91] 08/23/2016  . Right renal mass [N28.89]   . Renal carcinoma ([place]) [C64.9]   . 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 Encounter for therapeutic drug monitoring [Z51.81] 11/06/2013  . Acute NSTEMI (non-ST elevated myocardial infarction) [I21.4] 09/26/2013  . Chronic calculous cholecystitis [K80.10] 09/08/2013  . Acalculous cholecystitis [K81.9] 09/04/2013  . Abdominal pain, other specified site [R10.9] 09/01/2013  . Constipation [K59.00] 09/01/2013  . Hypotension [I95.9] 09/01/2013  . Leukocytosis [D72.829] 09/01/2013  . Normocytic anemia [D64.9] 09/01/2013  . Hyponatremia [E87.1] 09/01/2013  . Bruit [R09.89] 04/21/2013  . Long term (current) use of anticoagulants [Z79.01] 11/22/2010  . Chronic diastolic heart failure ([place]) [I50.32] 11/03/2010  . Aortic valve disorder [I35.9] 10/23/2010  . EDEMA [R60.9] 04/26/2010  . DYSPNEA [R06.02] 04/26/2010  . CHEST PAIN [R07.9] 04/26/2010  . Elevated lipids [E78.5] 01/21/2009  . HYPOKALEMIA [E87.6] 01/21/2009  . Essential hypertension [I10] 01/21/2009  . Coronary atherosclerosis [I25.10] 01/21/2009  . Atrial fibrillation ([place]) [I48.91] 01/21/2009  . ALTERED MENTAL STATUS [R41.82] 01/21/2009  . AORTIC VALVE REPLACEMENT, HX OF [Z95.4] 01/21/2009    Total Time spent with patient: 1 hour  Subjective:   [name] is a 83 y.o. male patient admitted with agitation and threatening behaviors.  HPI:  [name] is an 83 y.o. male, seen, chart reviewed and case discussed with staff RN and physician extender and treatment team. Patient is awake, alert, oriented to himself and not to the situation. He stated  that he does not know why he came to the ER. Patient is upset, irritable, agitated, disturbed sleep and appetite and verbally threatening to harm others if they mess up with him. He does like others touching him, and carrying two knifes due to being paranoid and dangerous to others due to his significant memory problems and misinterpretation of other people intentions etc. He is diagnosed with vascular dementia and has multiple ER visits for the same.   Below information from behavioral health assessment has been reviewed by me and I agreed with the findings. Patient presents to the ED BIB his son and daughter. When pt was asked why he was in the hospital he stated, "i'll be dammed if I know." Pt reports he is unsure why he came to the hospital and states he is angry with his son and daughter for bringing him to the hospital. Pt appeared partially disoriented during the assessment as he was not able to accurately identify who he lives with at home stating "I think it's my wife but I don't know." Pt was able to identify his career history and reports he retired from [employer] about 5 years ago. Pt was able to accurately identify his children by name and age.   Pt denies SI , or HI. According to reports from the pt's family, he has been increasingly aggressive at home and making threats to the family. While speaking with the pt's daughter, she reports the pt was assessed several weeks ago and was d/c home after a few days in the ED. [name] reports he has gotten worse since coming home and has fits of anger where he lashes out at the family. [name] reports she is fearful of her father due  to the threats he has made. Pt's son reports the pt made a threat to him today while at the hospital and stated "your time is coming." Pt's son and daughter report they fear for their mother's safety in the home with the pt due to the threats he has made. [name] reports she has concerns due to the  fact that the pt was assessed several weeks ago and was sent home. Pt's son reports the pt does not show symptomology while in the hospital therefore he is d/c but when he comes home, his aggression and anger worsen and become threatening for the family.   Pt appeared to be experiencing a flight of ideas in his thought processes. Pt was asked if he has access to weapons and he responded by saying "no, I wouldn't trust myself with them. They give me a shit ass job." Pt was asked if he would like water and he stated "if I can get some between now and [facility] Pt stated "I thought we were all getting along until I woke up in a damn hospital."  Past Psychiatric History: Vascular dementia with psychosis and behavioral problems and no previous psych admissions.  Risk to Self: Suicidal Ideation: No Suicidal Intent: No Is patient at risk for suicide?: No Suicidal Plan?: No Access to Means: No What has been your use of drugs/alcohol within the last 12 months?: denies Triggers for Past Attempts: None known Intentional Self Injurious Behavior: None Risk to Others: Homicidal Ideation: Yes-Currently Present (pt denies, family reports pt makes threats to kill them ) Thoughts of Harm to Others: Yes-Currently Present Comment - Thoughts of Harm to Others: pt denies, however in reviewing collateral contacts the pt's daughter and son report the pt has made threats to kill them  Current Homicidal Intent: No Current Homicidal Plan: No Access to Homicidal Means: No History of harm to others?: No Assessment of Violence: None Noted Does patient have access to weapons?: No Criminal Charges Pending?: No Does patient have a court date: No Prior Inpatient Therapy: Prior Inpatient Therapy: No Prior Outpatient Therapy: Prior Outpatient Therapy: No Does patient have an ACCT team?: No Does patient have Intensive In-House Services?  : No Does patient have [facility] services? : No Does patient have P4CC services?:  No  Past Medical History:  Past Medical History:  Diagnosis Date  . Aortic valve replaced   . Arthritis   . Atrial fibrillation ([place])    longterm persistent  . Back pain, chronic    SINCE BACK INJURY / FRACTURE  . BPH (benign prostatic hyperplasia)    grapey  . CAD (coronary artery disease)   . Cancer (HCC)    RIGHT RENAL  . Constipation   . Cough 06/16/12   C/O OF COUGH / COLD FOR A COUPLE OF WEEKS  . Dementia   . Diverticulitis    history  . Elevated fasting glucose 2012  . GERD (gastroesophageal reflux disease)   . HTN (hypertension)   . Hyperlipidemia   . Hypokalemia   . Kidney disorder    ablation defect in the right kidney lower pole but progression of interpolar lesion: stable on repeat CT (06/2014)  . Right renal mass    renal cell carcinoma  . Shortness of breath    PT STATES RELATED TO HIS AF AND HEART BEING OUT OF RHYTHM  . Stomach ulcer     Past Surgical History:  Procedure Laterality Date  . AORTIC VALVE REPLACEMENT  06/16/2002   St Jude  Regent Valve - OK for 1.5T or 3T, Normal SAR, < 3000 gauss/cm spatial gradient - S. Draughon RT MRSO  . CHOLECYSTECTOMY N/A 09/08/2013   Procedure: LAPAROSCOPIC CHOLECYSTECTOMY WITH INTRAOPERATIVE CHOLANGIOGRAM;  Surgeon: Odis Hollingshead, MD;  Location: [facility];  Service: General;  Laterality: N/A;  . CORONARY ARTERY BYPASS GRAFT    . KNEE SURGERY    . LEFT HEART CATHETERIZATION WITH CORONARY/GRAFT ANGIOGRAM N/A 09/07/2013   Procedure: LEFT HEART CATHETERIZATION WITH Beatrix Fetters;  Surgeon: Sinclair Grooms, MD;  Location: [facility] CATH LAB;  Service: Cardiovascular;  Laterality: N/A;  . RADIOFREQUENCY ABLATION KIDNEY     RIGHT (two surgeries)  . Thoracic T12 compression fracture     Family History:  Family History  Problem Relation Age of Onset  . Hypertension Mother   . Diabetes Mother   . Hypertension Sister   . Diabetes Sister   . Hypertension Brother   . Diabetes Brother    Family Psychiatric   History: Non contributory. Social History:  History  Alcohol Use No     History  Drug Use No    Social History   Social History  . Marital status: Married    Spouse name: N/A  . Number of children: 2  . Years of education: N/A   Occupational History  .      retired   Social History Main Topics  . Smoking status: Former Smoker    Packs/day: 2.00    Years: 20.00    Types: Cigarettes    Quit date: 08/27/1977  . Smokeless tobacco: Never Used  . Alcohol use No  . Drug use: No  . Sexual activity: Not Asked   Other Topics Concern  . None   Social History Narrative   Lives with his wife, good family support, ambulates without assist device.   Caffeine 2 cups daily.    Additional Social History:    Allergies:  No Known Allergies  Labs:  Results for orders placed or performed during the hospital encounter of 09/08/16 (from the past 48 hour(s))  Urinalysis, Routine w reflex microscopic     Status: None   Collection Time: 09/08/16 10:21 PM  Result Value Ref Range   Color, Urine YELLOW YELLOW   APPearance CLEAR CLEAR   Specific Gravity, Urine 1.013 1.005 - 1.030   pH 6.0 5.0 - 8.0   Glucose, UA NEGATIVE NEGATIVE mg/dL   Hgb urine dipstick NEGATIVE NEGATIVE   Bilirubin Urine NEGATIVE NEGATIVE   Ketones, ur NEGATIVE NEGATIVE mg/dL   Protein, ur NEGATIVE NEGATIVE mg/dL   Nitrite NEGATIVE NEGATIVE   Leukocytes, UA NEGATIVE NEGATIVE  CBC with Differential     Status: Abnormal   Collection Time: 09/08/16 10:23 PM  Result Value Ref Range   WBC 7.1 4.0 - 10.5 K/uL   RBC 3.90 (L) 4.22 - 5.81 MIL/uL   Hemoglobin 11.3 (L) 13.0 - 17.0 g/dL   HCT 33.0 (L) 39.0 - 52.0 %   MCV 84.6 78.0 - 100.0 fL   MCH 29.0 26.0 - 34.0 pg   MCHC 34.2 30.0 - 36.0 g/dL   RDW 12.7 11.5 - 15.5 %   Platelets 159 150 - 400 K/uL   Neutrophils Relative % 60 %   Neutro Abs 4.3 1.7 - 7.7 K/uL   Lymphocytes Relative 29 %   Lymphs Abs 2.0 0.7 - 4.0 K/uL   Monocytes Relative 6 %   Monocytes Absolute  0.4 0.1 - 1.0 K/uL   Eosinophils Relative 5 %  Eosinophils Absolute 0.3 0.0 - 0.7 K/uL   Basophils Relative 0 %   Basophils Absolute 0.0 0.0 - 0.1 K/uL  Basic metabolic panel     Status: Abnormal   Collection Time: 09/08/16 10:23 PM  Result Value Ref Range   Sodium 138 135 - 145 mmol/L   Potassium 3.5 3.5 - 5.1 mmol/L   Chloride 105 101 - 111 mmol/L   CO2 27 22 - 32 mmol/L   Glucose, Bld 138 (H) 65 - 99 mg/dL   BUN 26 (H) 6 - 20 mg/dL   Creatinine, Ser 0.92 0.61 - 1.24 mg/dL   Calcium 9.5 8.9 - 10.3 mg/dL   GFR calc non Af Amer >60 >60 mL/min   GFR calc Af Amer >60 >60 mL/min    Comment: (NOTE) The eGFR has been calculated using the CKD EPI equation. This calculation has not been validated in all clinical situations. eGFR's persistently <60 mL/min signify possible Chronic Kidney Disease.    Anion gap 6 5 - 15  Protime-INR     Status: Abnormal   Collection Time: 09/09/16  7:58 AM  Result Value Ref Range   Prothrombin Time 22.4 (H) 11.4 - 15.2 seconds   INR 1.94   TSH     Status: None   Collection Time: 09/09/16  9:39 AM  Result Value Ref Range   TSH 1.866 0.350 - 4.500 uIU/mL    Comment: Performed by a 3rd Generation assay with a functional sensitivity of <=0.01 uIU/mL.    Current Facility-Administered Medications  Medication Dose Route Frequency Provider Last Rate Last Dose  . aspirin EC tablet 81 mg  81 mg Oral Daily Daleen Bo, MD   81 mg at 09/09/16 0950  . atorvastatin (LIPITOR) tablet 40 mg  40 mg Oral QPM Daleen Bo, MD      . carvedilol (COREG) tablet 25 mg  25 mg Oral BID WC Daleen Bo, MD   25 mg at 09/09/16 0818  . digoxin (LANOXIN) tablet 125 mcg  125 mcg Oral Daily Daleen Bo, MD   125 mcg at 09/09/16 0949  . escitalopram (LEXAPRO) tablet 10 mg  10 mg Oral Daily Daleen Bo, MD   10 mg at 09/09/16 0949  . oxybutynin (DITROPAN-XL) 24 hr tablet 10 mg  10 mg Oral QODAY Daleen Bo, MD   10 mg at 09/09/16 0949  . potassium chloride  (K-DUR,KLOR-CON) CR tablet 10 mEq  10 mEq Oral Daily Daleen Bo, MD   10 mEq at 09/09/16 0949  . QUEtiapine (SEROQUEL) tablet 12.5 mg  12.5 mg Oral BID Ambrose Finland, MD      . ramipril (ALTACE) capsule 10 mg  10 mg Oral BID Daleen Bo, MD   10 mg at 09/09/16 0950  . tamsulosin (FLOMAX) capsule 0.4 mg  0.4 mg Oral Daily Daleen Bo, MD   0.4 mg at 09/09/16 0949  . torsemide (DEMADEX) tablet 10 mg  10 mg Oral BID Daleen Bo, MD   10 mg at 09/09/16 0949  . warfarin (COUMADIN) tablet 5 mg  5 mg Oral ONCE-1800 Daleen Bo, MD       Current Outpatient Prescriptions  Medication Sig Dispense Refill  . aspirin EC 81 MG EC tablet Take 1 tablet (81 mg total) by mouth daily. 30 tablet 0  . atorvastatin (LIPITOR) 40 MG tablet Take 40 mg by mouth daily.  3  . carvedilol (COREG) 25 MG tablet TAKE 1 TABLET BY MOUTH TWICE DAILY WITH A MEAL 60 tablet 2  .  cholecalciferol (VITAMIN D) 1000 units tablet Take 1,000 Units by mouth daily.    . digoxin (DIGOX) 0.125 MG tablet Take 1 tablet (125 mcg total) by mouth daily. 30 tablet 11  . escitalopram (LEXAPRO) 10 MG tablet Take 10 mg by mouth daily.    . ferrous sulfate 325 (65 FE) MG EC tablet Take 325 mg by mouth daily with breakfast.    . nitroGLYCERIN (NITROSTAT) 0.4 MG SL tablet Place 1 tablet (0.4 mg total) under the tongue every 5 (five) minutes as needed. For chest pain 25 tablet 5  . oxybutynin (DITROPAN-XL) 10 MG 24 hr tablet Take 10 mg by mouth every other day.     . potassium chloride (KLOR-CON) 8 MEQ tablet TAKE 1 TABLET BY MOUTH EVERY DAY 30 tablet 3  . ramipril (ALTACE) 10 MG capsule Take 10 mg by mouth 2 (two) times daily.    . Tamsulosin HCl (FLOMAX) 0.4 MG CAPS Take 0.4 mg by mouth daily.     [name] torsemide (DEMADEX) 10 MG tablet TAKE 1 TABLET BY MOUTH TWICE DAILY 60 tablet 6  . vitamin B-12 (CYANOCOBALAMIN) 1000 MCG tablet Take 1,000 mcg by mouth daily.      [name] warfarin (COUMADIN) 5 MG tablet Take as directed by coumadin clinic  (Patient taking differently: Take 10 MG Sunday, Tuesday and Thursday. Takes 7.5 MG on Monday, Wednesday, Saturday and Sunday) 180 tablet 0    Musculoskeletal: Strength & Muscle Tone: within normal limits Gait & Station: normal Patient leans: N/A  Psychiatric Specialty Exam: Physical Exam Full physical performed in Emergency Department. I have reviewed this assessment and concur with its findings.   ROS Agitation, irritability, paranoid and threatening.  No Fever-chills, No Headache, No changes with Vision or hearing, reports vertigo No problems swallowing food or Liquids, No Chest pain, Cough or Shortness of Breath, No Abdominal pain, No Nausea or Vommitting, Bowel movements are regular, No Blood in stool or Urine, No dysuria, No new skin rashes or bruises, No new joints pains-aches,  No new weakness, tingling, numbness in any extremity, No recent weight gain or loss, No polyuria, polydypsia or polyphagia,  A full 10 point Review of Systems was done, except as stated above, all other Review of Systems were negative.  Blood pressure 152/62, pulse 62, temperature 97.5 F (36.4 C), temperature source Oral, resp. rate 18, height '5\' 10"'$  (1.778 m), weight 95.7 kg (211 lb), SpO2 98 %.Body mass index is 30.28 kg/m.  General Appearance: Guarded  Eye Contact:  Good  Speech:  Clear and Coherent  Volume:  Normal  Mood:  Angry and Irritable  Affect:  Labile  Thought Process:  Coherent  Orientation:  Other:  oriented to self and others but not to his situation.  Thought Content:  Illogical, Paranoid Ideation and Rumination  Suicidal Thoughts:  No  Homicidal Thoughts:  Yes.  without intent/plan  Memory:  Immediate;   Fair Recent;   Poor Remote;   Poor  Judgement:  Impaired  Insight:  Lacking  Psychomotor Activity:  Increased  Concentration:  Concentration: Poor and Attention Span: Poor  Recall:  Poor  Fund of Knowledge:  Fair  Language:  Good  Akathisia:  Negative  Handed:  Right   AIMS (if indicated):     Assets:  Communication Skills Desire for Improvement Financial Resources/Insurance Housing Leisure Time Resilience Social Support Transportation  ADL's:  Intact  Cognition:  Impaired,  Moderate  Sleep:        Treatment Plan Summary: 82 years  old male with vascular dementia with paranoid psychosis and behavioral problems presented with increased irritability, agitation, aggression towards his family. He has made several statements including fowl language and threatening others that he has a knife. Reportedly he has been carrying two knifes all the time. His family is worried about safety of his wife who is almost the same age like him.  Will start IVC petition as he required in patient hospitalization for safety and medication management Reviewed EKG, QTC is 392, which is consider as normal without prolongation Will start Seroquel 12.5 mg BID for paranoid psychosis and agitation which can be increased up to 25 mg TID if tolerated well and clinically needed. Continue Lexapro 10 mg daily for mood, which is tolerated well Daily contact with patient to assess and evaluate symptoms and progress in treatment and Medication management LCSW will contact the family for collateral information  Disposition: Recommend psychiatric Inpatient admission when medically cleared. Supportive therapy provided about ongoing stressors.  Ambrose Finland, MD 09/09/2016 10:37 AM

## 2016-09-09 NOTE — ED Notes (Signed)
Pt continues to be restless, and intermittent anger ab verbally aggressive. Pt continues to fidget with items in his room and cursing out at staff.

## 2016-09-09 NOTE — Progress Notes (Signed)
CSW faxed patient's IVC paperwork to [facility].

## 2016-09-09 NOTE — Progress Notes (Signed)
CSW faxed patient's IVC paperwork to [facility] office. Magistrate confirmed paperwork received. CSW filed patient's IVC paperwork into IVC log book.

## 2016-09-09 NOTE — ED Notes (Signed)
Pt is disoriented to place, date or time.

## 2016-09-09 NOTE — ED Notes (Signed)
Pt in xr: delay in blood draw

## 2016-09-09 NOTE — Progress Notes (Signed)
CSW contacted [facility] to inquire about patient referral. Staff reported that referral was not received and provided CSW with fax number [phone]). Staff reported that they do have bed availability. CSW faxed patient's referral to [facility].

## 2016-09-09 NOTE — Progress Notes (Signed)
CSW contacted by [facility] staff with additional questions about patient referral. CSW provided requested information. Staff requested patient's IVC paperwork CSW informed staff that patient had not been served and that [facility] would fax IVC once patient was served. Staff reported that patient has bed offer and the accepting doctor is Dr. Lucilla Edin. The number to call report is [phone].

## 2016-09-09 NOTE — ED Notes (Signed)
Gave report to [facility] nurse.

## 2016-09-09 NOTE — Progress Notes (Signed)
CSW faxed patient referral to: [place], [place], [place], [place], [facility], [facility] and [facility].

## 2016-09-10 DIAGNOSIS — F0151 Vascular dementia with behavioral disturbance: Secondary | ICD-10-CM | POA: Diagnosis not present

## 2016-09-10 LAB — URINE CULTURE

## 2016-09-12 DIAGNOSIS — F0151 Vascular dementia with behavioral disturbance: Secondary | ICD-10-CM | POA: Diagnosis not present

## 2016-09-13 DIAGNOSIS — F0151 Vascular dementia with behavioral disturbance: Secondary | ICD-10-CM | POA: Diagnosis not present

## 2016-09-14 DIAGNOSIS — F0151 Vascular dementia with behavioral disturbance: Secondary | ICD-10-CM | POA: Diagnosis not present

## 2016-09-15 DIAGNOSIS — F0391 Unspecified dementia with behavioral disturbance: Secondary | ICD-10-CM | POA: Diagnosis not present

## 2016-09-17 ENCOUNTER — Institutional Professional Consult (permissible substitution): Payer: PPO | Admitting: Neurology

## 2016-09-20 ENCOUNTER — Telehealth: Payer: Self-pay | Admitting: Cardiovascular Disease

## 2016-09-20 ENCOUNTER — Telehealth: Payer: Self-pay | Admitting: Neurology

## 2016-09-20 DIAGNOSIS — F411 Generalized anxiety disorder: Secondary | ICD-10-CM | POA: Diagnosis not present

## 2016-09-20 DIAGNOSIS — F0391 Unspecified dementia with behavioral disturbance: Secondary | ICD-10-CM | POA: Diagnosis not present

## 2016-09-20 DIAGNOSIS — Z7189 Other specified counseling: Secondary | ICD-10-CM | POA: Diagnosis not present

## 2016-09-20 DIAGNOSIS — Z952 Presence of prosthetic heart valve: Secondary | ICD-10-CM | POA: Diagnosis not present

## 2016-09-20 DIAGNOSIS — Z5181 Encounter for therapeutic drug level monitoring: Secondary | ICD-10-CM | POA: Diagnosis not present

## 2016-09-20 NOTE — Telephone Encounter (Signed)
New Message  [name] voiced pt's results should/will be sent over from pts PCP.   Please f/u if needed

## 2016-09-20 NOTE — Telephone Encounter (Signed)
NOTED ./CY 

## 2016-09-20 NOTE — Telephone Encounter (Signed)
I spoke with Dr. Addison Lank this AM. Patient had recent emergency room visit for anger outbursts and behavioral escalations. He had an inpatient stay in behavioral health and was recently discharged to home a couple of days ago. Is now on Risperdal which per PCP has helped his behavior he was calm and cooperative - she saw him today in follow-up. His MRI brain had to be delayed because of his recent hospitalization but is now not until end of February. We will look into this. We may be able to move up his brain MRI appointment. Danielle, can you look into this?

## 2016-09-28 ENCOUNTER — Other Ambulatory Visit: Payer: PPO

## 2016-10-03 NOTE — Telephone Encounter (Signed)
I got the auth from Health team Auth: [id number] (exp. 11/26/16) I contact GI and asked them to reach out to the patient to reschedule. They informed me they would.

## 2016-10-03 NOTE — Telephone Encounter (Signed)
[name], this is for you!

## 2016-10-04 DIAGNOSIS — I480 Paroxysmal atrial fibrillation: Secondary | ICD-10-CM | POA: Diagnosis not present

## 2016-10-04 DIAGNOSIS — Z7901 Long term (current) use of anticoagulants: Secondary | ICD-10-CM | POA: Diagnosis not present

## 2016-10-12 ENCOUNTER — Ambulatory Visit
Admission: RE | Admit: 2016-10-12 | Discharge: 2016-10-12 | Disposition: A | Discharge status: Home / Self Care | Payer: PPO | Source: Ambulatory Visit | Attending: Neurology | Admitting: Neurology

## 2016-10-12 DIAGNOSIS — F0151 Vascular dementia with behavioral disturbance: Secondary | ICD-10-CM | POA: Diagnosis not present

## 2016-10-12 DIAGNOSIS — F0390 Unspecified dementia without behavioral disturbance: Secondary | ICD-10-CM | POA: Diagnosis not present

## 2016-10-12 DIAGNOSIS — F01518 Vascular dementia, unspecified severity, with other behavioral disturbance: Secondary | ICD-10-CM

## 2016-10-12 DIAGNOSIS — R410 Disorientation, unspecified: Secondary | ICD-10-CM | POA: Diagnosis not present

## 2016-10-12 NOTE — Progress Notes (Signed)
Please call patient's daughter or son regarding the recent brain MRI: The brain scan showed moderate to severe volume loss which we call atrophy. This has progressed since 2008, but in 10 years it would be expected to get worse. There were changes in the deeper structures of the brain, which we call white matter changes or microvascular changes. These were reported as mild to moderate and also increased since 2008. These are tiny white spots, that occur with time and are seen in a variety of conditions, including with normal aging, chronic hypertension, chronic headaches, especially migraine HAs, chronic diabetes, chronic hyperlipidemia. These are not strokes and no mass or lesion were seen which is reassuring. A developmental venous anomaly was seen, of no significance as this time.  Again, there were no acute findings, such as a stroke, or mass or blood products.  Brain atrophy goes along with history of dementia. MRI is of course not a diagnostic test for dementia, but a helpful diagnostic tool. Findings are in keeping with his clinical picture of progressive memory decline.  Please remind them of upcoming appointment on 11/28/16 and see if we can move up the appt? Thanks,  Star Age, MD, PhD

## 2016-10-15 ENCOUNTER — Telehealth: Payer: Self-pay

## 2016-10-15 DIAGNOSIS — F0391 Unspecified dementia with behavioral disturbance: Secondary | ICD-10-CM

## 2016-10-15 NOTE — Telephone Encounter (Signed)
I called pt's daughter, Heath Gold, per [facility]. I advised her of pt's MRI results. [name] does not want a sooner appt at this time, because the pt is doing well. Pt verbalized understanding of results.  [name] says that pt is not using his cpap and that he won't use his cpap and they want to turn it in because they are still getting billed for it, whether he uses it or not. The recently received a bill for over $400. They want to know if they have Dr. Guadelupe Sabin permission to turn the cpap in. [name] says that the cpap is actually at her house because the pt gets "ancy" when he sees the machine.

## 2016-10-15 NOTE — Telephone Encounter (Signed)
I called pt's daughter, [name], per [facility] and advised her that Dr. Rexene Alberts discontinued pt's cpap order. Will send the order to [facility] and [facility] should be contacting them within the next couple days. [name] verbalized understanding.

## 2016-10-15 NOTE — Telephone Encounter (Signed)
-----   Message from Star Age, MD sent at 10/12/2016  2:13 PM EST ----- Please call patient's daughter or son regarding the recent brain MRI: The brain scan showed moderate to severe volume loss which we call atrophy. This has progressed since 2008, but in 10 years it would be expected to get worse. There were changes in the deeper structures of the brain, which we call white matter changes or microvascular changes. These were reported as mild to moderate and also increased since 2008. These are tiny white spots, that occur with time and are seen in a variety of conditions, including with normal aging, chronic hypertension, chronic headaches, especially migraine HAs, chronic diabetes, chronic hyperlipidemia. These are not strokes and no mass or lesion were seen which is reassuring. A developmental venous anomaly was seen, of no significance as this time.  Again, there were no acute findings, such as a stroke, or mass or blood products.  Brain atrophy goes along with history of dementia. MRI is of course not a diagnostic test for dementia, but a helpful diagnostic tool. Findings are in keeping with his clinical picture of progressive memory decline.  Please remind them of upcoming appointment on 11/28/16 and see if we can move up the appt? Thanks,  Star Age, MD, PhD

## 2016-10-15 NOTE — Telephone Encounter (Signed)
Will discontinue CPAP d/t non-compliance and inability to tolerate d/t dementia w beh disturbance. Order for D/C placed in chart, pls fax/route to DME provider.

## 2016-10-25 DIAGNOSIS — I480 Paroxysmal atrial fibrillation: Secondary | ICD-10-CM | POA: Diagnosis not present

## 2016-10-25 DIAGNOSIS — Z7901 Long term (current) use of anticoagulants: Secondary | ICD-10-CM | POA: Diagnosis not present

## 2016-11-02 ENCOUNTER — Other Ambulatory Visit: Payer: Self-pay | Admitting: Cardiovascular Disease

## 2016-11-15 ENCOUNTER — Ambulatory Visit: Payer: PPO | Admitting: Neurology

## 2016-11-16 ENCOUNTER — Other Ambulatory Visit: Payer: Self-pay | Admitting: Cardiovascular Disease

## 2016-11-16 ENCOUNTER — Telehealth: Payer: Self-pay | Admitting: Cardiovascular Disease

## 2016-11-16 NOTE — Telephone Encounter (Signed)
New Message  Pts daughter voiced it's hard to bring pt to office due to pt recently diagnosed with dementia.  Please f/u with daughter on other options/solutions.

## 2016-11-16 NOTE — Telephone Encounter (Signed)
I spoke with patient's daughter, [name] (on [place]).  She states it is becoming increasingly hard to get Father out of the house and to appointments d/t the recent dx of dementia.  She states he needs appointment scheduled.  I advised it may be easier to have an early AM or late PM appointment.  I made appointment for 12/13/2016 with Dr. Johnsie Cancel.  [name] is requesting we refill Demadex 10 mg.  She states he has enough of his other medication.    Ok to refill Demadex?

## 2016-11-19 NOTE — Telephone Encounter (Signed)
Ok to refill demedex

## 2016-11-20 ENCOUNTER — Telehealth: Payer: Self-pay | Admitting: Radiology

## 2016-11-20 MED ORDER — TORSEMIDE 10 MG PO TABS: 10.0000 mg | ORAL_TABLET | Freq: Two times a day (BID) | ORAL | 1 refills | Status: DC

## 2016-11-20 NOTE — Telephone Encounter (Signed)
Spoke with [name] (patient's daughter) to schedule follow up appointment.  Kim shared that it is difficult to transport patient due to current health status.  (Patient is being treated for dementia and is having problems with incontinence.)  She is planning to discuss with her brother the recommend follow up per Dr Kathlene Cote.  They will decide whether to pursue this and will contact our office with their decision.    Breland Elders Riki Rusk, RN 11/20/2016 12:13 PM

## 2016-11-20 NOTE — Telephone Encounter (Signed)
Rx sent to pharmacy per Dr. Johnsie Cancel.

## 2016-11-27 ENCOUNTER — Encounter: Payer: Self-pay | Admitting: Cardiovascular Disease

## 2016-11-28 ENCOUNTER — Ambulatory Visit: Payer: PPO | Admitting: Neurology

## 2016-12-05 ENCOUNTER — Other Ambulatory Visit: Payer: Self-pay | Admitting: Cardiovascular Disease

## 2016-12-09 NOTE — Progress Notes (Signed)
83 y.o. male with  a history of CAD, Aortic stenosis, status post CABG and [employer] aortic valve replacement 05/2002, permanent atrial fibrillation, hypertension, hyperlipidemia. Last nuclear study 04/2010: Low-risk study with mild fixed basal to mid inferior perfusion defect defect representing diaphragmatic attenuation versus prior infarct, no ischemia.  BP improved with addition of norvasc   Memory has gotten quite poor   Hospitalized for abdominal pain  1/15 with cholecystitis    Troponins elevated no acute ECG changes EF 65% by echo Cath done by Dr Tamala Julian  prior to surgery 09/07/13  OK and medical Rx recommended   IMPRESSIONS: 1. Widely patent saphenous vein grafts as noted above. Graft to the LAD via the internal mammary is also widely patent. 2. 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 3. Potential ischemia in the third obtuse marginal territory as flow is compromised by obstruction in the second obtuse marginal proximal to the vein graft and the 80% stenosis in the mid circumflex. This territory is relatively small and should be treatable with medical therapy. If refractory angina, stenting of the mid circumflex would resolve this problem. This does not seem prudent in this situation given the need for general surgery and the absence of symptoms.  Had uncomplicated GB removal   Reviewed his last echo done 11/21/15 EF normal  Mean AVR gradient 21 mmHg peak gradient 39 mmHg trace AR Mild MR Estimated PA 44 mmHg  Fell yesterday has some abrasions on arms Has home care to look after him and wife Daughter and son very attentive  ROS: Denies fever, malais, weight loss, blurry vision, decreased visual acuity, cough, sputum, SOB, hemoptysis, pleuritic pain, palpitaitons, heartburn, abdominal pain, melena, lower extremity edema, claudication, or rash.  All other systems reviewed and  negative  General: Affect appropriate Healthy:  appears stated age 83: normal Neck supple with no adenopathy JVP normal no bruits no thyromegaly Lungs clear with no wheezing and good diaphragmatic motion Heart:  I7/O6 click SEM no AR murmur, no rub, gallop or click PMI normal Abdomen: benighn, BS positve, no tenderness, no AAA no bruit.  No HSM or HJR Distal pulses intact with no bruits No edema Neuro non-focal Skin warm and dry No muscular weakness   Current Outpatient Prescriptions  Medication Sig Dispense Refill  . aspirin EC 81 MG EC tablet Take 1 tablet (81 mg total) by mouth daily. 30 tablet 0  . atorvastatin (LIPITOR) 40 MG tablet Take 40 mg by mouth daily.  3  . carvedilol (COREG) 25 MG tablet TAKE 1 TABLET(25 MG) BY MOUTH TWICE DAILY WITH A MEAL. 60 tablet 0  . cholecalciferol (VITAMIN D) 1000 units tablet Take 1,000 Units by mouth daily.    . digoxin (DIGOX) 0.125 MG tablet Take 1 tablet (125 mcg total) by mouth daily. 30 tablet 11  . escitalopram (LEXAPRO) 10 MG tablet Take 10 mg by mouth daily.    . ferrous sulfate 325 (65 FE) MG EC tablet Take 325 mg by mouth daily with breakfast.    . nitroGLYCERIN (NITROSTAT) 0.4 MG SL tablet Place 1 tablet (0.4 mg total) under the tongue every 5 (five) minutes as needed. For chest pain 25 tablet 5  . oxybutynin (DITROPAN-XL) 10 MG 24 hr tablet Take 10 mg by mouth every other day.     . potassium chloride (KLOR-CON) 8 MEQ tablet TAKE 1 TABLET BY MOUTH EVERY DAY 30 tablet 3  .  ramipril (ALTACE) 10 MG capsule Take 10 mg by mouth 2 (two) times daily.    . risperiDONE (RISPERDAL) 0.5 MG tablet Take 0.5 mg by mouth 3 (three) times daily.  1  . Tamsulosin HCl (FLOMAX) 0.4 MG CAPS Take 0.4 mg by mouth daily.     [name] torsemide (DEMADEX) 10 MG tablet Take 1 tablet (10 mg total) by mouth 2 (two) times daily. 60 tablet 1  . traZODone (DESYREL) 50 MG tablet Take 50 mg by mouth as needed.    . vitamin B-12 (CYANOCOBALAMIN) 1000 MCG tablet Take  1,000 mcg by mouth daily.      [name] warfarin (COUMADIN) 5 MG tablet Take as directed by coumadin clinic (Patient taking differently: Take 10 MG Sunday, Tuesday and Thursday. Takes 7.5 MG on Monday, Wednesday, Saturday and Sunday) 180 tablet 0   No current facility-administered medications for this visit.     Allergies  Patient has no known allergies.  Electrocardiogram:  afib rate 74 otherwise normal  04/05/14    11/07/15 afib rate 56  No changes   Assessment and Plan AVR:  Mechanical needs SBE  No change in SEM gradients a bit higher on echo 11/21/15 but No clinical significance and given age and dementia no need for TEE CAD: Stable with no angina and good activity level.  Continue medical Rx Afib: good rate control and anticoagulation seeing coumadin clinic today Chol:  No results found for: [facility]  Labs with primary  Memory:  Dementia worse having hard time getting out of house  HTN: Well controlled.  Continue current medications and low sodium Dash type diet.  BMET today DM:   Lab Results  Component Value Date   HGBA1C 5.8 (H) 11/07/2015  Abrasions;  Wrapped with gauze and neosporin.      Jenkins Rouge

## 2016-12-13 ENCOUNTER — Encounter: Payer: Self-pay | Admitting: Cardiovascular Disease

## 2016-12-13 ENCOUNTER — Ambulatory Visit (INDEPENDENT_AMBULATORY_CARE_PROVIDER_SITE_OTHER): Payer: PPO | Admitting: *Deleted

## 2016-12-13 ENCOUNTER — Ambulatory Visit (INDEPENDENT_AMBULATORY_CARE_PROVIDER_SITE_OTHER): Payer: PPO | Admitting: Cardiovascular Disease

## 2016-12-13 VITALS — BP 102/56 | HR 81 | Ht 76.0 in | Wt 233.0 lb

## 2016-12-13 DIAGNOSIS — Z7901 Long term (current) use of anticoagulants: Secondary | ICD-10-CM | POA: Diagnosis not present

## 2016-12-13 DIAGNOSIS — I482 Chronic atrial fibrillation, unspecified: Secondary | ICD-10-CM

## 2016-12-13 DIAGNOSIS — Z5181 Encounter for therapeutic drug level monitoring: Secondary | ICD-10-CM | POA: Diagnosis not present

## 2016-12-13 DIAGNOSIS — I359 Nonrheumatic aortic valve disorder, unspecified: Secondary | ICD-10-CM

## 2016-12-13 DIAGNOSIS — I4891 Unspecified atrial fibrillation: Secondary | ICD-10-CM

## 2016-12-13 DIAGNOSIS — Z952 Presence of prosthetic heart valve: Secondary | ICD-10-CM | POA: Diagnosis not present

## 2016-12-13 LAB — POCT INR: INR: 2.9

## 2016-12-13 NOTE — Patient Instructions (Signed)

## 2016-12-27 DIAGNOSIS — H6981 Other specified disorders of Eustachian tube, right ear: Secondary | ICD-10-CM | POA: Diagnosis not present

## 2017-01-09 ENCOUNTER — Other Ambulatory Visit: Payer: Self-pay | Admitting: Cardiovascular Disease

## 2017-01-09 ENCOUNTER — Telehealth: Payer: Self-pay | Admitting: Cardiovascular Disease

## 2017-01-09 NOTE — Telephone Encounter (Signed)
[name]  01/09/2017  Refill  MRN:  [id number]  Description: 83 year old male Provider: Josue Hector, MD Department: [facility] [address] [facility]  Call Documentation   No notes of this type exist for this encounter.  Encounter MyChart Messages   No messages in this encounter  Approved    Disp Refills Start End  carvedilol (COREG) 25 MG tablet 180 tablet 3 01/09/2017   Sig:  TAKE 1 TABLET BY MOUTH TWICE DAILY WITH MEALS  Class:  Normal  DAW:  No  Authorizing Provider:  Josue Hector, MD  Ordering User:  Stephannie Peters, [place] [id number] - [place], [place] - [address]  Contacts    Type Contact Phone  01/09/2017 11:35 AM Interface (Incoming) [facility] [phone]  Routing History   Priority Sent On From To Message Type   01/09/2017 11:35 AM Interface, Surescripts Out [facility] Refill Rx Request  Created by   Interface, Surescripts Out on 01/09/2017 11:35 AM

## 2017-01-09 NOTE — Telephone Encounter (Signed)
New message      *STAT* If patient is at the pharmacy, call can be transferred to refill team.   1. Which medications need to be refilled? (please list name of each medication and dose if known)  carvedilol (COREG) 25 MG tablet TAKE 1 TABLET(25 MG) BY MOUTH TWICE DAILY WITH A MEAL.     2. Which pharmacy/location (including street and city if local pharmacy) is medication to be sent to? [facility] [place]   3. Do they need a 30 day or 90 day supply? 90  Completely out

## 2017-01-31 ENCOUNTER — Ambulatory Visit: Payer: PPO | Admitting: Neurology

## 2017-01-31 ENCOUNTER — Encounter: Payer: Self-pay | Admitting: Neurology

## 2017-01-31 ENCOUNTER — Ambulatory Visit (INDEPENDENT_AMBULATORY_CARE_PROVIDER_SITE_OTHER): Payer: PPO | Admitting: Neurology

## 2017-01-31 ENCOUNTER — Encounter (INDEPENDENT_AMBULATORY_CARE_PROVIDER_SITE_OTHER): Payer: Self-pay

## 2017-01-31 VITALS — BP 114/66 | HR 74

## 2017-01-31 DIAGNOSIS — F01518 Vascular dementia, unspecified severity, with other behavioral disturbance: Secondary | ICD-10-CM

## 2017-01-31 DIAGNOSIS — F0391 Unspecified dementia with behavioral disturbance: Secondary | ICD-10-CM

## 2017-01-31 DIAGNOSIS — F03918 Unspecified dementia, unspecified severity, with other behavioral disturbance: Secondary | ICD-10-CM

## 2017-01-31 DIAGNOSIS — F0151 Vascular dementia with behavioral disturbance: Secondary | ICD-10-CM | POA: Diagnosis not present

## 2017-01-31 MED ORDER — TRAZODONE HCL 50 MG PO TABS: 50.0000 mg | ORAL_TABLET | ORAL | 1 refills | Status: DC | PRN

## 2017-01-31 MED ORDER — RISPERIDONE 0.5 MG PO TABS
0.5000 mg | ORAL_TABLET | Freq: Three times a day (TID) | ORAL | 1 refills | Status: AC
Start: 2017-01-31 — End: ?

## 2017-01-31 NOTE — Patient Instructions (Signed)
As discussed, we will not start any memory medication at this time as you are doing rather well and are stabilized. Your memory scores have declined however. We will continue to monitor. Nevertheless because of your recent medication changes, your mood and behavioral escalations are much better. We will not risk any side effects and not start any new memory medication, may consider Namenda down the Road.  I will refer you to geriatric psychiatry for ongoing management of the mood disorder and your medications.  I have renewed the prescriptions for Risperdal and trazodone to bridge the refills.

## 2017-01-31 NOTE — Progress Notes (Signed)
Subjective:    Patient ID: [name] is a 83 y.o. male.  HPI     Interim history:   [name] is an 83 year old right-handed gentleman with an underlying complex medical history of aortic stenosis, coronary artery disease, status post CABG and St. Jude aortic valve replacement in October 2003, chronic atrial fibrillation, hypertension, hyperlipidemia, T12 compression fracture with status post kyphoplasty, diverticulitis, renal cell mass and treatment resistant urinary incontinence, followed by urology, who presents for follow-up consultation of his dementia with behavioral disturbance. The patient is accompanied by his son and his daughter today [name] and [name]). I last saw him on 08/30/2016, at which time we talked about his gait disorder and sleep apnea. He was not using his CPAP.  He had been seen in the emergency room for behavioral escalations. His daughter was providing his history. She reported that he became very agitated and confused about a week prior. He had had more episodic confusion and was also verbally abusive and agitated, would get fixated on one thing and was quite frustrated about the CPAP machine as well and stopped using it. He would take out his frustration on his wife and they were worried about his wife. He presented to the emergency room on 08/22/2016 for agitation and AMS. He was assessed by behavioral health at the time and was not deemed a suicidal risk, he was started on Lexapro recently. He had a head CT without contrast on 08/22/2016 which showed atrophy and chronic microvascular disease. He had not had any physical aggression did have some frustration intolerance and had thrown out some things out of the fridge. His daughter talk to me separately as well. His MMSE was 21 out of 30 at the time. I suggested we proceed with an EEG and a brain MRI. I suggested he restart CPAP therapy but he could not do it and eventually we discontinued CPAP therapy in the interim. His  brain MRI without contrast on 10/12/2016 showed: IMPRESSION:  This MRI of the brain without contrast shows the following: 1.   There is moderately severe cortical atrophy, most pronounced in the mesial temporal lobes. This has progressively compared to the 2008 MRI 2.   Mild-to-moderate chronic microvascular ischemic change, increased when compared to the 2008 MRI. 3.   Minimal chronic left ethmoid sinusitis. 4.   Left cerebellar hemisphere developmental venous anomaly. This is unlikely to be of significance.  5.   There are no acute findings.   We called his family with the test results.  He did not have EEG done.  Today, 01/31/2017 (all dictated new, as well as above notes, some dictation done in note pad or Word, outside of chart, may appear as copied):  He reports doing okay, no particular complaints. His history is primarily provided by his daughter. Of note, he presented to the emergency room accompanied by family on 09/08/2016 for aggressive behavior and threatening behavior, threatening to harm others as well. He was deemed appropriate for inpatient behavioral admission by psychiatry. He was in [facility] for about 10 days. He was started on trazodone and Risperdal 0.5 mg 3 times a day. His primary care physician is not comfortable maintaining these medications. Behaviorally he is doing much better. He is calmer, no recent agitation, no verbal outbursts, no threatening behaviors. He has not established with outpatient psychiatry upon discharge from behavioral admission. They now have a CNA 3 times a week for about 4 hours each time through a company called [employer]. They also have nurses on staff. He gets help with showering and she also cooks and does light housework. He does tend to sleep a lot. Per daughter, his wife has had some memory issues herself and has back pain. Overall, things at home are stable, in fact much improved compared to January 2018. They would be willing to  proceed with a referral to geriatric psychiatry for ongoing monitoring and management of his mood disorder and behavioral disorder. I will bridge prescription for now so they don't run out. He no longer uses CPAP and it caused a lot of anxiety at one point. They have tried to streamline his appointments to avoid disruption in his schedule and avoid heroic diagnostic testing. Per daughter, they are going to try to increase the help at the house to 5 days a week, maybe 3 hours at a time.  The patient's allergies, current medications, family history, past medical history, past social history, past surgical history and problem list were reviewed and updated as appropriate.   Previously (copied from previous notes for reference):    I saw him on 02/16/2016, at which time he was fully compliant with CPAP therapy. He felt improved with CPAP. We talked about gait safety and I reordered his brain MRI which he had not done previously.    I saw him on 07/27/2015, at which time he reported doing reasonably well. He felt he was sleeping better with CPAP. We talked about the need for CPAP treatment ongoing. He was reporting nocturia which was fairly unchanged. He noted that he was more off balance when the lights were out and it was dark. He was trying to adjust to his balance: He problems. He reported one fall in the past few months. I reassured her and his brain MRI. He had a CT abdomen and pelvis for recheck of his renal mass on 06/30/2015 which I reviewed: 1. Stable ablation site in the medial aspect of the RIGHT kidney with no evidence of local recurrence. 2. Bosniak 1 and Bosniak 2 renal cysts of the RIGHT kidney.   I reordered a brain MRI. I asked him to use his cane or walker at all times and stay hydrated and compliant with CPAP therapy.   I reviewed his CPAP compliance data from 01/16/2016 through 02/14/2016 which is a total of 30 days during which time he used his machine every night with percent used  days greater than 4 hours at 100%, indicating superb compliance with an average usage of 6 hours and 27 minutes, residual AHI 0.8 per hour, leak low with the 95th percentile at 7.6 L/m on a pressure of 10 cm with EPR of 3.   He had to cancel an appointment for 05/31/2015. I saw him on 11/29/2014, at which time he reported that he initially had trouble adjusting to CPAP but then had been compliant. He felt that he no longer snoring but was not sure if he slept much better than before. But he did endorse feeling better rested when he first woke up. He had no recent falls. He was using a cane. He denied any restless leg symptoms to speak of. He had no recent medication changes. He was not always drink enough water. He was not exercising regularly. His knees were bothering him. He was getting a procedure for bladder hyperactivity. I asked him to continue to be compliant with CPAP therapy, advised him to change positions slowly, drink more water, and use his cane or 2 wheeled walker  at all times for safety. I reordered a brain MRI as he had not had it done when I first ordered it.    I reviewed his CPAP compliance data from 06/26/2015 through 07/25/2015 which is a total of 30 days during which time he used his machine every night, with percent used days greater than 4 hours of 100%, indicating superb compliance with an average usage of 7 hours and 34 minutes, residual AHI low at 0.7 per hour, leak acceptable with the 95th percentile at 10.1 L/m at a pressure of 10 cm with EPR of 3.   I first met him on 08/31/2014 at the request of his primary care physician, at which time he reported a 6 month history of balance problems and recurrent falls. He also reported nonrestorative sleep, daytime tiredness and snoring. At the time of his initial visit, I felt he most likely had a multifactorial gait disturbance secondary to aging, degenerative back disease with abnormal posture, possible medication side effects, in  particular with anticholinergic medications, dehydration, and in the context of nonrestorative sleep. He had symptoms of OSA. I advised him to have a sleep study. He had a split-night sleep study on 09/10/2014 and went over his test results with him in detail today. Baseline sleep efficiency was 72.7%, latency to sleep was 24 minutes, wake after sleep onset was 10.5 minutes with mild sleep fragmentation noted. He had an elevated arousal index. He had absence of REM sleep prior to CPAP initiation. He had A. fib on EKG. He had severe periodic leg movements but minimal arousals. He had mild to moderate and loud snoring. Total AHI was 54.6 per hour. Baseline oxygen saturation was 93%, nadir was 86%. He was titrated on CPAP during the rest of the study starting at approximately 11:10 PM. Arousal index was normal. REM sleep was 6.6% after CPAP initiation. Average oxygen saturation was 94%, nadir was 91%. He had no significant PLMS during the treatment portion of the study. CPAP was titrated from 5-11 cm and his AHI was 0 per hour on a pressure of 10 cm. Based on his sleep test results are prescribed CPAP therapy for home use. I also requested that he have a brain MRI but this was not done and he is not sure why it was not done and was not aware of the MRI order.   I reviewed his compliance data with CPAP therapy from 10/25/2014 through 11/23/2014 which is a total of 30 days during which time he used his machine every night with percent used days greater than 4 hours at 100%, indicating superb compliance with an average usage of 4 hours and 27 minutes for all nights. Residual AHI low at 0.7 per hour. Leak acceptable with the 95th percentile at 15 L/m on a pressure of 10 cm with EPR of 3.   Has had balance problems and recurrent falls for the past 6 months. He has had physical therapy twice a week for about a month which was not necessarily very helpful. He has been advised to use his walker at all times. He has fallen  repeatedly. He has had some shuffling of his walking. He has had severe nocturia, 5-6 times per night for the past 5-6 months. He has been on Vesicare, but will switch to Myrbetriq samples soon. He has occasional morning headaches. He does not wake up rested. He snores some. He has never had a sleep study. He drinks 2 cups of coffee, but not enough water. He quit  smoking in 81 and does not drink alcohol.   He denies vertigo type symptoms. Sometimes he feels lightheaded when he stands quickly. He is particularly insecure in the dark. He still drives but not in the dark. He has not had any recent blood work. He fell yesterday as he was trying to sit on a little stool which did not carry his weight and broke. He hit his head backwards but does not have any residual symptoms. He did not lose consciousness.      His Past Medical History Is Significant For: Past Medical History:  Diagnosis Date  . Aortic valve replaced   . Arthritis   . Atrial fibrillation ([place])    longterm persistent  . Back pain, chronic    SINCE BACK INJURY / FRACTURE  . BPH (benign prostatic hyperplasia)    grapey  . CAD (coronary artery disease)   . Cancer (HCC)    RIGHT RENAL  . Constipation   . Cough 06/16/12   C/O OF COUGH / COLD FOR A COUPLE OF WEEKS  . Dementia   . Diverticulitis    history  . Elevated fasting glucose 2012  . GERD (gastroesophageal reflux disease)   . HTN (hypertension)   . Hyperlipidemia   . Hypokalemia   . Kidney disorder    ablation defect in the right kidney lower pole but progression of interpolar lesion: stable on repeat CT (06/2014)  . Right renal mass    renal cell carcinoma  . Shortness of breath    PT STATES RELATED TO HIS AF AND HEART BEING OUT OF RHYTHM  . Stomach ulcer     His Past Surgical History Is Significant For: Past Surgical History:  Procedure Laterality Date  . AORTIC VALVE REPLACEMENT  06/16/2002   St Jude Regent Valve - OK for 1.5T or 3T, Normal SAR, < 3000  gauss/cm spatial gradient - S. Draughon RT MRSO  . CHOLECYSTECTOMY N/A 09/08/2013   Procedure: LAPAROSCOPIC CHOLECYSTECTOMY WITH INTRAOPERATIVE CHOLANGIOGRAM;  Surgeon: Odis Hollingshead, MD;  Location: [facility];  Service: General;  Laterality: N/A;  . CORONARY ARTERY BYPASS GRAFT    . KNEE SURGERY    . LEFT HEART CATHETERIZATION WITH CORONARY/GRAFT ANGIOGRAM N/A 09/07/2013   Procedure: LEFT HEART CATHETERIZATION WITH Beatrix Fetters;  Surgeon: Sinclair Grooms, MD;  Location: [facility] CATH LAB;  Service: Cardiovascular;  Laterality: N/A;  . RADIOFREQUENCY ABLATION KIDNEY     RIGHT (two surgeries)  . Thoracic T12 compression fracture      His Family History Is Significant For: Family History  Problem Relation Age of Onset  . Hypertension Mother   . Diabetes Mother   . Hypertension Sister   . Diabetes Sister   . Hypertension Brother   . Diabetes Brother     His Social History Is Significant For: Social History   Social History  . Marital status: Married    Spouse name: N/A  . Number of children: 2  . Years of education: N/A   Occupational History  .      retired   Social History Main Topics  . Smoking status: Former Smoker    Packs/day: 2.00    Years: 20.00    Types: Cigarettes    Quit date: 08/27/1977  . Smokeless tobacco: Never Used  . Alcohol use No  . Drug use: No  . Sexual activity: Not Asked   Other Topics Concern  . None   Social History Narrative   Lives with his wife, good  family support, ambulates without assist device.   Caffeine 2 cups daily.     His Allergies Are:  No Known Allergies:   His Current Medications Are:  Outpatient Encounter Prescriptions as of 01/31/2017  Medication Sig  . aspirin EC 81 MG EC tablet Take 1 tablet (81 mg total) by mouth daily.  [name] atorvastatin (LIPITOR) 40 MG tablet Take 40 mg by mouth daily.  . carvedilol (COREG) 25 MG tablet TAKE 1 TABLET BY MOUTH TWICE DAILY WITH MEALS  . cholecalciferol (VITAMIN D) 1000 units  tablet Take 1,000 Units by mouth daily.  . digoxin (DIGOX) 0.125 MG tablet Take 1 tablet (125 mcg total) by mouth daily.  [name] escitalopram (LEXAPRO) 10 MG tablet Take 10 mg by mouth daily.  . ferrous sulfate 325 (65 FE) MG EC tablet Take 325 mg by mouth daily with breakfast.  . nitroGLYCERIN (NITROSTAT) 0.4 MG SL tablet Place 1 tablet (0.4 mg total) under the tongue every 5 (five) minutes as needed. For chest pain  . potassium chloride (KLOR-CON) 8 MEQ tablet TAKE 1 TABLET BY MOUTH EVERY DAY  . ramipril (ALTACE) 10 MG capsule Take 10 mg by mouth 2 (two) times daily.  . risperiDONE (RISPERDAL) 0.5 MG tablet Take 0.5 mg by mouth 3 (three) times daily.  . Tamsulosin HCl (FLOMAX) 0.4 MG CAPS Take 0.4 mg by mouth daily.   . traZODone (DESYREL) 50 MG tablet Take 50 mg by mouth as needed.  . vitamin B-12 (CYANOCOBALAMIN) 1000 MCG tablet Take 1,000 mcg by mouth daily.    [name] warfarin (COUMADIN) 5 MG tablet Take as directed by coumadin clinic (Patient taking differently: Take 10 MG Sunday, Tuesday and Thursday. Takes 7.5 MG on Monday, Wednesday, Saturday and Sunday)  . torsemide (DEMADEX) 10 MG tablet Take 1 tablet (10 mg total) by mouth 2 (two) times daily.  . [DISCONTINUED] oxybutynin (DITROPAN-XL) 10 MG 24 hr tablet Take 10 mg by mouth every other day.    No facility-administered encounter medications on file as of 01/31/2017.   :  Review of Systems:  Out of a complete 14 point review of systems, all are reviewed and negative with the exception of these symptoms as listed below:  Review of Systems  Neurological:       Pt presents today to follow up on his memory and gait. Pt had an admission to a behavioral hospital in [place] recently. Pt was prescribed trazodone and risperdal during that admission and pt's PCP Dr. Addison Lank has declined to take over those prescriptions and is asking for Dr. Rexene Alberts to take over these prescriptions.    Objective:  Neurologic Exam  Physical Exam Physical  Examination:   Vitals:   01/31/17 0822  BP: 114/66  Pulse: 74   General Examination: The patient is a very pleasant 83 y.o. male in no acute distress. He appears calm. He is cooperative with the exam. He is in a wheelchair. He is well groomed.   HEENT: Normocephalic, atraumatic, extraocular tracking is fair. Hearing is mildly impaired. Face is symmetric. No significant facial masking noted.  Chest: Clear to auscultation without wheezing, rhonchi or crackles noted.  Heart: Irregular and he has a systolic murmur, unchanged; valve click.   Abdomen: Soft, non-tender and non-distended with normal bowel sounds appreciated on auscultation.  Extremities: There is 1+ pitting edema in the distal lower extremities bilaterally.  Skin: Warm and dry without trophic changes noted. There are smaller bruises on the forearms, of note, he is on coumadin.  Musculoskeletal: exam  reveals no obvious joint deformities, tenderness or joint swelling or erythema, mild knee swelling.   Neurologically:  Mental status: The patient is awake, but is not paying full attention, is somewhat tired looking, tends to keep eyes closed.Unable to provide history. History is primarily provided by his daughter. His immediate and remote memory, attention, language skills and fund of knowledge are impaired. There is no evidence of aphasia, agnosia, apraxia or anomia. Speech is clear but scant.  No agitation. Mood good.   On 08/30/2016: MMSE: 21/30, CDT: 3/4, AFT: 8/min.   On 01/31/2017: MMSE: 18/30, CDT: 2/4, AFT: 2/min.  Cranial nerves II - XII are as described above under HEENT exam.  Motor exam: Normal bulk, strength for age and tone is noted. There is no postural or resting tremor. Romberg is not testable today. Reflexes are about 1+ in the UEs and absent in the LEs. Fine motor skills are mildly impaired throughout. Sensory exam: intact to light touch in the upper and lower extremities.  Gait, station and balance: I  did not have him stand or walk for me today.  Assessment and Plan:   In summary, JAYSEAN MANVILLE is an 83 year old male with an underlying complex medical history of aortic stenosis, coronary artery disease, status post CABG and St. Jude aortic valve replacement in October 2003, chronic atrial fibrillation, hypertension, hyperlipidemia, T12 compression fracture with status post kyphoplasty, diverticulitis, renal cell mass and treatment resistant urinary incontinence, who presents for follow-up consultation of his dementia with behavioral disturbance and significant progression noted over this past year. He was not able to tolerate CPAP. CPAP caused escalation and anxiety and we discontinued treatment. He was previously able to be compliant with CPAP therapy for his obstructive sleep apnea. His memory scores have declined. He has risk factors primarily for vascular dementia, and given his age and behavioral disturbance, his medical history he is also at high risk for medication side effects were me to try dementia medications. I explained this to the patient and particularly his children. He had a recent behavioral admission in January 2018 in [place] and is now on Risperdal and trazodone at night. I suggested referral to geriatric psychiatry for ongoing monitoring and management of his mood disorder and behavioral disorder. We mutually agreed to forego trying new dementia medication at this time. Maybe we will consider Namenda at our next appointment. From the methadone behavioral standpoint he is doing rather well. They have help at the house for the patient and his wife and they may increase the help to 5 days a week gradually. We recently did a brain MRI and we talked about the results before. He did not get the EEG done. I suggested a follow-up in 4-5 months. We can see him sooner if needed. I bridged his prescriptions for Risperdal and trazodone and placed a referral to Dr. Casimiro Needle.  He is on  Lexapro 10 mg daily per PCP for the past 6 months or so.  I encouraged his daughter to email me for any updates in the interim through My Chart. She and her brother are the only children and have been encouraged to seek healthcare power of attorney for both parents. I answered all their questions today and the patient and his family were in agreement. I spent 25 minutes in total face-to-face time with the patient, more than 50% of which was spent in counseling and coordination of care, reviewing test results, reviewing medication and discussing or reviewing the diagnosis of dementia, its prognosis  and treatment options. Pertinent laboratory and imaging test results that were available during this visit with the patient were reviewed by me and considered in my medical decision making (see chart for details).

## 2017-02-04 ENCOUNTER — Other Ambulatory Visit: Payer: Self-pay | Admitting: Cardiovascular Disease

## 2017-02-04 DIAGNOSIS — R8290 Unspecified abnormal findings in urine: Secondary | ICD-10-CM | POA: Diagnosis not present

## 2017-02-14 ENCOUNTER — Ambulatory Visit: Payer: PPO | Admitting: Neurology

## 2017-02-22 ENCOUNTER — Other Ambulatory Visit: Payer: Self-pay | Admitting: Cardiovascular Disease

## 2017-03-08 ENCOUNTER — Encounter (HOSPITAL_COMMUNITY): Payer: Self-pay | Admitting: Emergency Medicine

## 2017-03-08 ENCOUNTER — Emergency Department (HOSPITAL_COMMUNITY): Payer: PPO

## 2017-03-08 ENCOUNTER — Observation Stay (HOSPITAL_COMMUNITY)
Admission: EM | Admit: 2017-03-08 | Discharge: 2017-03-10 | Disposition: A | Discharge status: Home / Self Care | Payer: PPO | Attending: Internal Medicine | Admitting: Internal Medicine

## 2017-03-08 DIAGNOSIS — Z7901 Long term (current) use of anticoagulants: Secondary | ICD-10-CM | POA: Diagnosis not present

## 2017-03-08 DIAGNOSIS — E876 Hypokalemia: Secondary | ICD-10-CM | POA: Diagnosis not present

## 2017-03-08 DIAGNOSIS — I2510 Atherosclerotic heart disease of native coronary artery without angina pectoris: Secondary | ICD-10-CM | POA: Insufficient documentation

## 2017-03-08 DIAGNOSIS — Z79899 Other long term (current) drug therapy: Secondary | ICD-10-CM | POA: Insufficient documentation

## 2017-03-08 DIAGNOSIS — I110 Hypertensive heart disease with heart failure: Secondary | ICD-10-CM | POA: Diagnosis not present

## 2017-03-08 DIAGNOSIS — R531 Weakness: Secondary | ICD-10-CM | POA: Diagnosis not present

## 2017-03-08 DIAGNOSIS — Z952 Presence of prosthetic heart valve: Secondary | ICD-10-CM | POA: Diagnosis not present

## 2017-03-08 DIAGNOSIS — Z87891 Personal history of nicotine dependence: Secondary | ICD-10-CM | POA: Diagnosis not present

## 2017-03-08 DIAGNOSIS — E785 Hyperlipidemia, unspecified: Secondary | ICD-10-CM | POA: Insufficient documentation

## 2017-03-08 DIAGNOSIS — I5032 Chronic diastolic (congestive) heart failure: Secondary | ICD-10-CM | POA: Diagnosis present

## 2017-03-08 DIAGNOSIS — Z951 Presence of aortocoronary bypass graft: Secondary | ICD-10-CM | POA: Diagnosis not present

## 2017-03-08 DIAGNOSIS — E860 Dehydration: Principal | ICD-10-CM | POA: Insufficient documentation

## 2017-03-08 DIAGNOSIS — K922 Gastrointestinal hemorrhage, unspecified: Secondary | ICD-10-CM | POA: Diagnosis present

## 2017-03-08 DIAGNOSIS — Z8719 Personal history of other diseases of the digestive system: Secondary | ICD-10-CM | POA: Insufficient documentation

## 2017-03-08 DIAGNOSIS — I6789 Other cerebrovascular disease: Secondary | ICD-10-CM | POA: Diagnosis not present

## 2017-03-08 DIAGNOSIS — R197 Diarrhea, unspecified: Secondary | ICD-10-CM | POA: Diagnosis not present

## 2017-03-08 DIAGNOSIS — I4891 Unspecified atrial fibrillation: Secondary | ICD-10-CM | POA: Diagnosis not present

## 2017-03-08 DIAGNOSIS — I10 Essential (primary) hypertension: Secondary | ICD-10-CM | POA: Diagnosis present

## 2017-03-08 DIAGNOSIS — Z85528 Personal history of other malignant neoplasm of kidney: Secondary | ICD-10-CM | POA: Insufficient documentation

## 2017-03-08 DIAGNOSIS — A419 Sepsis, unspecified organism: Secondary | ICD-10-CM | POA: Diagnosis not present

## 2017-03-08 DIAGNOSIS — G8929 Other chronic pain: Secondary | ICD-10-CM | POA: Diagnosis not present

## 2017-03-08 DIAGNOSIS — K219 Gastro-esophageal reflux disease without esophagitis: Secondary | ICD-10-CM | POA: Diagnosis not present

## 2017-03-08 DIAGNOSIS — G9341 Metabolic encephalopathy: Secondary | ICD-10-CM | POA: Insufficient documentation

## 2017-03-08 DIAGNOSIS — E871 Hypo-osmolality and hyponatremia: Secondary | ICD-10-CM | POA: Diagnosis not present

## 2017-03-08 DIAGNOSIS — J9811 Atelectasis: Secondary | ICD-10-CM | POA: Diagnosis not present

## 2017-03-08 DIAGNOSIS — N400 Enlarged prostate without lower urinary tract symptoms: Secondary | ICD-10-CM | POA: Diagnosis not present

## 2017-03-08 DIAGNOSIS — F03918 Unspecified dementia, unspecified severity, with other behavioral disturbance: Secondary | ICD-10-CM | POA: Diagnosis present

## 2017-03-08 DIAGNOSIS — K580 Irritable bowel syndrome with diarrhea: Secondary | ICD-10-CM | POA: Diagnosis not present

## 2017-03-08 DIAGNOSIS — J189 Pneumonia, unspecified organism: Secondary | ICD-10-CM | POA: Diagnosis not present

## 2017-03-08 DIAGNOSIS — R260 Ataxic gait: Secondary | ICD-10-CM | POA: Diagnosis not present

## 2017-03-08 DIAGNOSIS — I481 Persistent atrial fibrillation: Secondary | ICD-10-CM | POA: Insufficient documentation

## 2017-03-08 DIAGNOSIS — D649 Anemia, unspecified: Secondary | ICD-10-CM | POA: Insufficient documentation

## 2017-03-08 DIAGNOSIS — F0391 Unspecified dementia with behavioral disturbance: Secondary | ICD-10-CM | POA: Insufficient documentation

## 2017-03-08 DIAGNOSIS — Z7982 Long term (current) use of aspirin: Secondary | ICD-10-CM | POA: Insufficient documentation

## 2017-03-08 DIAGNOSIS — K921 Melena: Secondary | ICD-10-CM | POA: Diagnosis not present

## 2017-03-08 LAB — CBC WITH DIFFERENTIAL/PLATELET
BASOS PCT: 0 %
Basophils Absolute: 0 10*3/uL (ref 0.0–0.1)
EOS ABS: 0.1 10*3/uL (ref 0.0–0.7)
EOS PCT: 1 %
HCT: 35.4 % — ABNORMAL LOW (ref 39.0–52.0)
Hemoglobin: 12.3 g/dL — ABNORMAL LOW (ref 13.0–17.0)
LYMPHS ABS: 1.4 10*3/uL (ref 0.7–4.0)
Lymphocytes Relative: 15 %
MCH: 29.1 pg (ref 26.0–34.0)
MCHC: 34.7 g/dL (ref 30.0–36.0)
MCV: 83.9 fL (ref 78.0–100.0)
MONOS PCT: 8 %
Monocytes Absolute: 0.7 10*3/uL (ref 0.1–1.0)
Neutro Abs: 6.8 10*3/uL (ref 1.7–7.7)
Neutrophils Relative %: 76 %
PLATELETS: 184 10*3/uL (ref 150–400)
RBC: 4.22 MIL/uL (ref 4.22–5.81)
RDW: 12.9 % (ref 11.5–15.5)
WBC: 9 10*3/uL (ref 4.0–10.5)

## 2017-03-08 LAB — PROTIME-INR
INR: 1.89
INR: 1.98
Prothrombin Time: 22 seconds — ABNORMAL HIGH (ref 11.4–15.2)
Prothrombin Time: 22.8 seconds — ABNORMAL HIGH (ref 11.4–15.2)

## 2017-03-08 LAB — I-STAT CG4 LACTIC ACID, ED: Lactic Acid, Venous: 0.98 mmol/L (ref 0.5–1.9)

## 2017-03-08 LAB — COMPREHENSIVE METABOLIC PANEL
ALK PHOS: 88 U/L (ref 38–126)
ALT: 14 U/L — AB (ref 17–63)
AST: 16 U/L (ref 15–41)
Albumin: 3.7 g/dL (ref 3.5–5.0)
Anion gap: 9 (ref 5–15)
BILIRUBIN TOTAL: 1.3 mg/dL — AB (ref 0.3–1.2)
BUN: 19 mg/dL (ref 6–20)
CALCIUM: 9.5 mg/dL (ref 8.9–10.3)
CO2: 28 mmol/L (ref 22–32)
CREATININE: 1.06 mg/dL (ref 0.61–1.24)
Chloride: 101 mmol/L (ref 101–111)
GFR calc non Af Amer: >60 mL/min (ref >60)
Glucose, Bld: 136 mg/dL — ABNORMAL HIGH (ref 65–99)
Potassium: 3.6 mmol/L (ref 3.5–5.1)
SODIUM: 138 mmol/L (ref 135–145)
TOTAL PROTEIN: 6.7 g/dL (ref 6.5–8.1)

## 2017-03-08 LAB — C DIFFICILE QUICK SCREEN W PCR REFLEX
C DIFFICLE (CDIFF) ANTIGEN: NEGATIVE
C Diff interpretation: NOT DETECTED
C Diff toxin: NEGATIVE

## 2017-03-08 LAB — URINALYSIS, ROUTINE W REFLEX MICROSCOPIC
BILIRUBIN URINE: NEGATIVE
Glucose, UA: NEGATIVE mg/dL
HGB URINE DIPSTICK: NEGATIVE
KETONES UR: 5 mg/dL — AB
Leukocytes, UA: NEGATIVE
NITRITE: NEGATIVE
PROTEIN: NEGATIVE mg/dL
Specific Gravity, Urine: 1.017 (ref 1.005–1.030)
pH: 5 (ref 5.0–8.0)

## 2017-03-08 LAB — TYPE AND SCREEN
ABO/RH(D): O POS
ANTIBODY SCREEN: NEGATIVE

## 2017-03-08 LAB — LIPASE, BLOOD: Lipase: 18 U/L (ref 11–51)

## 2017-03-08 LAB — I-STAT TROPONIN, ED: Troponin i, poc: 0.01 ng/mL (ref 0.00–0.08)

## 2017-03-08 LAB — POC OCCULT BLOOD, ED: FECAL OCCULT BLD: POSITIVE — AB

## 2017-03-08 LAB — TSH: TSH: 1.458 u[IU]/mL (ref 0.350–4.500)

## 2017-03-08 MED ORDER — ONDANSETRON HCL 4 MG/2ML IJ SOLN: 4.0000 mg | Freq: Four times a day (QID) | INTRAMUSCULAR | Status: DC | PRN

## 2017-03-08 MED ORDER — TRAZODONE HCL 50 MG PO TABS
50.0000 mg | ORAL_TABLET | Freq: Every evening | ORAL | Status: DC | PRN
  Administered 2017-03-08 – 2017-03-09 (×2): 50 mg via ORAL
  Filled 2017-03-08 (×2): qty 1

## 2017-03-08 MED ORDER — VITAMIN B-12 1000 MCG PO TABS
1000.0000 ug | ORAL_TABLET | Freq: Every day | ORAL | Status: DC
  Administered 2017-03-08 – 2017-03-10 (×3): 1000 ug via ORAL
  Filled 2017-03-08 (×3): qty 1

## 2017-03-08 MED ORDER — NITROGLYCERIN 0.4 MG SL SUBL: 0.4000 mg | SUBLINGUAL_TABLET | SUBLINGUAL | Status: DC | PRN

## 2017-03-08 MED ORDER — SODIUM CHLORIDE 0.9 % IV SOLN
INTRAVENOUS | Status: DC
  Administered 2017-03-08 (×2): via INTRAVENOUS

## 2017-03-08 MED ORDER — VITAMIN D3 25 MCG (1000 UNIT) PO TABS
1000.0000 [IU] | ORAL_TABLET | Freq: Every day | ORAL | Status: DC
  Administered 2017-03-08 – 2017-03-10 (×3): 1000 [IU] via ORAL
  Filled 2017-03-08 (×3): qty 1

## 2017-03-08 MED ORDER — SODIUM CHLORIDE 0.9 % IV BOLUS (SEPSIS)
500.0000 mL | Freq: Once | INTRAVENOUS | Status: AC
  Administered 2017-03-08: 500 mL via INTRAVENOUS

## 2017-03-08 MED ORDER — DIGOXIN 125 MCG PO TABS
125.0000 ug | ORAL_TABLET | Freq: Every day | ORAL | Status: DC
  Administered 2017-03-08 – 2017-03-09 (×2): 125 ug via ORAL
  Filled 2017-03-08 (×2): qty 1

## 2017-03-08 MED ORDER — FERROUS SULFATE 325 (65 FE) MG PO TABS
325.0000 mg | ORAL_TABLET | Freq: Every day | ORAL | Status: DC
  Administered 2017-03-09: 325 mg via ORAL
  Filled 2017-03-08: qty 1

## 2017-03-08 MED ORDER — RISPERIDONE 0.5 MG PO TABS
0.5000 mg | ORAL_TABLET | Freq: Three times a day (TID) | ORAL | Status: DC
  Administered 2017-03-08 – 2017-03-10 (×7): 0.5 mg via ORAL
  Filled 2017-03-08 (×6): qty 1

## 2017-03-08 MED ORDER — RAMIPRIL 10 MG PO CAPS
10.0000 mg | ORAL_CAPSULE | Freq: Two times a day (BID) | ORAL | Status: DC
  Administered 2017-03-08 – 2017-03-10 (×4): 10 mg via ORAL
  Filled 2017-03-08 (×4): qty 1

## 2017-03-08 MED ORDER — CARVEDILOL 25 MG PO TABS
25.0000 mg | ORAL_TABLET | Freq: Two times a day (BID) | ORAL | Status: DC
  Administered 2017-03-08 – 2017-03-09 (×2): 25 mg via ORAL
  Filled 2017-03-08 (×2): qty 1

## 2017-03-08 MED ORDER — ATORVASTATIN CALCIUM 40 MG PO TABS
40.0000 mg | ORAL_TABLET | Freq: Every day | ORAL | Status: DC
  Administered 2017-03-08 – 2017-03-10 (×3): 40 mg via ORAL
  Filled 2017-03-08 (×3): qty 1

## 2017-03-08 MED ORDER — ONDANSETRON HCL 4 MG PO TABS: 4.0000 mg | ORAL_TABLET | Freq: Four times a day (QID) | ORAL | Status: DC | PRN

## 2017-03-08 MED ORDER — ESCITALOPRAM OXALATE 10 MG PO TABS
10.0000 mg | ORAL_TABLET | Freq: Every day | ORAL | Status: DC
  Administered 2017-03-08 – 2017-03-10 (×3): 10 mg via ORAL
  Filled 2017-03-08 (×3): qty 1

## 2017-03-08 MED ORDER — SODIUM CHLORIDE 0.9 % IV SOLN
Freq: Once | INTRAVENOUS | Status: DC
  Administered 2017-03-08: 15:00:00 via INTRAVENOUS

## 2017-03-08 MED ORDER — SODIUM CHLORIDE 0.9% FLUSH
3.0000 mL | Freq: Two times a day (BID) | INTRAVENOUS | Status: DC
  Administered 2017-03-09 – 2017-03-10 (×3): 3 mL via INTRAVENOUS

## 2017-03-08 NOTE — ED Notes (Signed)
ED Provider at bedside. 

## 2017-03-08 NOTE — ED Provider Notes (Signed)
Emergency Department Provider Note   I have reviewed the triage vital signs and the nursing notes.   HISTORY  Chief Complaint dark stool   HPI [name] is a 83 y.o. male with artificial aortic valve, a-fib, CAD, HTN, HLD, and BPH presents to the emergency department for evaluation of generalized weakness, inability to walk, and dark/frequent stools over the past 24 hours. Patient states that he got up this morning and was so weak he could not walk. He notes diarrhea starting yesterday that is dark in color. Denies fevers or chills. No chest pain or difficulty breathing. No burning when he urinates. Denies starting any new medications. Patient is on Coumadin with history of A. fib and artificial aortic valve.  Past Medical History:  Diagnosis Date  . Aortic valve replaced   . Arthritis   . Atrial fibrillation ([place])    longterm persistent  . Back pain, chronic    SINCE BACK INJURY / FRACTURE  . BPH (benign prostatic hyperplasia)    grapey  . CAD (coronary artery disease)   . Cancer (HCC)    RIGHT RENAL  . Constipation   . Cough 06/16/12   C/O OF COUGH / COLD FOR A COUPLE OF WEEKS  . Dementia   . Diverticulitis    history  . Elevated fasting glucose 2012  . GERD (gastroesophageal reflux disease)   . HTN (hypertension)   . Hyperlipidemia   . Hypokalemia   . Kidney disorder    ablation defect in the right kidney lower pole but progression of interpolar lesion: stable on repeat CT (06/2014)  . Right renal mass    renal cell carcinoma  . Shortness of breath    PT STATES RELATED TO HIS AF AND HEART BEING OUT OF RHYTHM  . Stomach ulcer     Patient Active Problem List   Diagnosis Date Noted  . GI bleed 03/08/2017  . Dementia with behavioral disturbance 08/23/2016  . Right renal mass   . Renal carcinoma ([place])   . Spastic bladder 10/05/2014  . Encounter for therapeutic drug monitoring 11/06/2013  . Acute NSTEMI (non-ST elevated myocardial infarction) 09/26/2013    . Chronic calculous cholecystitis 09/08/2013  . Acalculous cholecystitis 09/04/2013  . Abdominal pain, other specified site 09/01/2013  . Constipation 09/01/2013  . Hypotension 09/01/2013  . Leukocytosis 09/01/2013  . Normocytic anemia 09/01/2013  . Hyponatremia 09/01/2013  . Bruit 04/21/2013  . Long term (current) use of anticoagulants 11/22/2010  . Chronic diastolic heart failure ([place]) 11/03/2010  . Aortic valve disorder 10/23/2010  . EDEMA 04/26/2010  . DYSPNEA 04/26/2010  . CHEST PAIN 04/26/2010  . Elevated lipids 01/21/2009  . HYPOKALEMIA 01/21/2009  . Essential hypertension 01/21/2009  . Coronary atherosclerosis 01/21/2009  . Atrial fibrillation ([place]) 01/21/2009  . ALTERED MENTAL STATUS 01/21/2009  . AORTIC VALVE REPLACEMENT, HX OF 01/21/2009    Past Surgical History:  Procedure Laterality Date  . AORTIC VALVE REPLACEMENT  06/16/2002   St Jude Regent Valve - OK for 1.5T or 3T, Normal SAR, < 3000 gauss/cm spatial gradient - S. Draughon RT MRSO  . CHOLECYSTECTOMY N/A 09/08/2013   Procedure: LAPAROSCOPIC CHOLECYSTECTOMY WITH INTRAOPERATIVE CHOLANGIOGRAM;  Surgeon: Odis Hollingshead, MD;  Location: [facility];  Service: General;  Laterality: N/A;  . CORONARY ARTERY BYPASS GRAFT    . KNEE SURGERY    . LEFT HEART CATHETERIZATION WITH CORONARY/GRAFT ANGIOGRAM N/A 09/07/2013   Procedure: LEFT HEART CATHETERIZATION WITH Beatrix Fetters;  Surgeon: Sinclair Grooms, MD;  Location: [facility];  Service: Cardiovascular;  Laterality: N/A;  . RADIOFREQUENCY ABLATION KIDNEY     RIGHT (two surgeries)  . Thoracic T12 compression fracture      Current Outpatient Rx  . Order #: [id number] Class: OTC  . Order #: [id number] Class: Historical Med  . Order #: [id number] Class: Normal  . Order #: [id number] Class: Historical Med  . Order #: [id number] Class: Normal  . Order #: [id number] Class: Historical Med  . Order #: [id number] Class: Historical Med  . Order #: [id number] Class: Normal  .  Order #: [id number] Class: Normal  . Order #: [id number] Class: Historical Med  . Order #: [id number] Class: Normal  . Order #: [id number] Class: Normal  . Order #: [id number] Class: Normal  . Order #: [id number] Class: Historical Med  . Order #: [id number] Class: Historical Med  . Order #: [id number] Class: Normal    Allergies Patient has no known allergies.  Family History  Problem Relation Age of Onset  . Hypertension Mother   . Diabetes Mother   . Hypertension Sister   . Diabetes Sister   . Hypertension Brother   . Diabetes Brother     Social History Social History  Substance Use Topics  . Smoking status: Former Smoker    Packs/day: 2.00    Years: 20.00    Types: Cigarettes    Quit date: 08/27/1977  . Smokeless tobacco: Never Used  . Alcohol use No    Review of Systems  Constitutional: No fever/chills. Positive generalized weakness.  Eyes: No visual changes. ENT: No sore throat. Cardiovascular: Denies chest pain. Respiratory: Denies shortness of breath. Gastrointestinal: No abdominal pain.  No nausea, no vomiting. Positive diarrhea.  No constipation. Genitourinary: Negative for dysuria. Musculoskeletal: Negative for back pain. Skin: Negative for rash. Neurological: Negative for headaches, focal weakness or numbness.  10-point ROS otherwise negative.  ____________________________________________   PHYSICAL EXAM:  VITAL SIGNS: ED Triage Vitals  Enc Vitals Group     BP 03/08/17 1034 136/77     Pulse Rate 03/08/17 1034 82     Resp 03/08/17 1034 18     Temp 03/08/17 1034 98 F (36.7 C)     Temp Source 03/08/17 1034 Oral     SpO2 03/08/17 1034 97 %     Weight 03/08/17 1036 230 lb (104.3 kg)     Height 03/08/17 1035 6\' 4"  (1.93 m)   Constitutional: Alert and oriented. Well appearing and in no acute distress. Eyes: Conjunctivae are normal.  Head: Atraumatic. Nose: No congestion/rhinnorhea. Mouth/Throat: Mucous membranes are moist.  Neck: No stridor.     Cardiovascular: Normal rate, regular rhythm. Good peripheral circulation. Audible snap noted with otherwise normal heart sounds.   Respiratory: Normal respiratory effort.  No retractions. Lungs CTAB. Gastrointestinal: Soft and nontender. No distention. Dark green-tinted stool on rectal exam with no appreciable perirectal masses. No melena or frank blood. Musculoskeletal: No lower extremity tenderness nor edema. No gross deformities of extremities. Neurologic:  Normal speech and language. No gross focal neurologic deficits are appreciated.  Skin:  Skin is warm, dry and intact. No rash noted.  ____________________________________________   LABS (all labs ordered are listed, but only abnormal results are displayed)  Labs Reviewed  COMPREHENSIVE METABOLIC PANEL - Abnormal; Notable for the following:       Result Value   Glucose, Bld 136 (*)    ALT 14 (*)    Total Bilirubin 1.3 (*)    All other components within normal limits  CBC WITH DIFFERENTIAL/PLATELET - Abnormal; Notable for the following:  Hemoglobin 12.3 (*)    HCT 35.4 (*)    All other components within normal limits  URINALYSIS, ROUTINE W REFLEX MICROSCOPIC - Abnormal; Notable for the following:    Ketones, ur 5 (*)    All other components within normal limits  PROTIME-INR - Abnormal; Notable for the following:    Prothrombin Time 22.0 (*)    All other components within normal limits  POC OCCULT BLOOD, ED - Abnormal; Notable for the following:    Fecal Occult Bld POSITIVE (*)    All other components within normal limits  C DIFFICILE QUICK SCREEN W PCR REFLEX  URINE CULTURE  LIPASE, BLOOD  I-STAT TROPOININ, ED  I-STAT CG4 LACTIC ACID, ED  TYPE AND SCREEN   ____________________________________________  EKG   EKG Interpretation  Date/Time:  Friday March 08 2017 11:08:11 EDT Ventricular Rate:  68 PR Interval:    QRS Duration: 107 QT Interval:  444 QTC Calculation: 473 R Axis:   73 Text Interpretation:  Atrial  fibrillation RSR' in V1 or V2, right VCD or RVH Nonspecific repol abnormality, lateral leads No STEMI. Similar to prior.  Confirmed by Nanda Quinton [phone]) on 03/08/2017 2:45:08 PM       ____________________________________________  RADIOLOGY  Dg Chest 2 View  Result Date: 03/08/2017 CLINICAL DATA:  Weakness EXAM: CHEST  2 VIEW COMPARISON:  09/09/2016 FINDINGS: There is no focal parenchymal opacity. There is no pleural effusion or pneumothorax. There is stable cardiomegaly. There is evidence of prior CABG. The osseous structures are unremarkable. IMPRESSION: No active cardiopulmonary disease. Electronically Signed   By: Kathreen Devoid   On: 03/08/2017 11:26    ____________________________________________   PROCEDURES  Procedure(s) performed:   Procedures  None ____________________________________________   INITIAL IMPRESSION / ASSESSMENT AND PLAN / ED COURSE  Pertinent labs & imaging results that were available during my care of the patient were reviewed by me and considered in my medical decision making (see chart for details).  Patient presents to the emergency department for evaluation of generalized weakness and diarrhea over the last 2 days it is dark in color. I do not appreciate any melena on exam or otherwise gross blood. Given the patient's frequent stools and generalized weakness I have sent c. Diff sample for PCR. Also sending labs, UA, and INR with patient's history on Coumadin. Patient has both a-fib and artifical heart valve. Plan for gentle IVF and reassessment.   UA and labs are largely unremarkable. She continues to have diarrhea here not consistent with melena. And to discuss admission with the hospitalist for observation and continued IV fluids overnight. Suspect the patient's weakness is secondary to dehydration but would consider trending H/H with Coumadin history.   Discussed patient's case with [facility], Dr. Doyle Askew.  Recommend admission to obs, tele bed.  I  will place holding orders per their request. Patient and family (if present) updated with plan. Care transferred to Hospitalist service.  I reviewed all nursing notes, vitals, pertinent old records, EKGs, labs, imaging (as available). ____________________________________________  FINAL CLINICAL IMPRESSION(S) / ED DIAGNOSES  Final diagnoses:  Dehydration  Diarrhea of presumed infectious origin  Blood in stool     MEDICATIONS GIVEN DURING THIS VISIT:  Medications  0.9 %  sodium chloride infusion ( Intravenous New Bag/Given 03/08/17 1500)  sodium chloride 0.9 % bolus 500 mL (0 mLs Intravenous Stopped 03/08/17 1340)     NEW OUTPATIENT MEDICATIONS STARTED DURING THIS VISIT:  None    Note:  This document was prepared using  [employer] forces training and education officer and may include unintentional dictation errors.  Nanda Quinton, MD Emergency Medicine    Long, Wonda Old[facility], MD 03/08/17 [phone]

## 2017-03-08 NOTE — ED Triage Notes (Signed)
[facility] patient comes from home c/o off balance for several weeks and dark stools with bowel incontinence over the past couple days. Patient also appears pale to family. Patient a&ox3.  Vitals: 138/70, HR 94, CBG 140, 95% on RA. p

## 2017-03-08 NOTE — H&P (Signed)
History and Physical    [name]:[id number] DOB: [birth year] DOA: 03/08/2017  Referring MD/NP/PA: Dr. Laverta Baltimore  PCP: [name] Caraway, MD   Patient coming from: home  Chief Complaint:   HPI: [name] is a 83 y.o. male with no when atrial fibrillation, artificial aortic valve, on chronic Coumadin, hypertension, hyperlipidemia, BPH. Patient presented to emergency department with several days duration of progressively worsening weakness and lethargy, poor oral intake. Family at bedside explains that patient also has moderate dementia and is not able to provide detailed history, he has caregiver at home. Family explains that caregiver reported diarrhea that was dark in color, no bright red blood. Patient himself denies seeing any blood in stool. It is unknown how many episodes of diarrhea he has had since he came to emergency department. Family explained that patient ate some check an that was left in her refrigerator for several days, other than that no specific sick contacts or exposures. Family has noted that patient has been more agitated as well. No reports of chest pains or dyspnea, no reports of abdominal concerns. Family reports noticing decreased urine output.  ED Course: Patient is hemodynamically stable, vital signs notable for heart rate 69, respiratory rate 26, SBP 151. Blood work fairly unremarkable, hemoglobin 12.3. FOBT positive. TRH asked to admit for further evaluation.  Review of Systems:  Patient unable to provide history, he is slightly agitated, underlying dementia  Past Medical History:  Diagnosis Date  . Aortic valve replaced   . Arthritis   . Atrial fibrillation ([place])    longterm persistent  . Back pain, chronic    SINCE BACK INJURY / FRACTURE  . BPH (benign prostatic hyperplasia)    grapey  . CAD (coronary artery disease)   . Cancer (HCC)    RIGHT RENAL  . Constipation   . Cough 06/16/12   C/O OF COUGH / COLD FOR A COUPLE OF WEEKS  . Dementia   .  Diverticulitis    history  . Elevated fasting glucose 2012  . GERD (gastroesophageal reflux disease)   . HTN (hypertension)   . Hyperlipidemia   . Hypokalemia   . Kidney disorder    ablation defect in the right kidney lower pole but progression of interpolar lesion: stable on repeat CT (06/2014)  . Right renal mass    renal cell carcinoma  . Shortness of breath    PT STATES RELATED TO HIS AF AND HEART BEING OUT OF RHYTHM  . Stomach ulcer     Past Surgical History:  Procedure Laterality Date  . AORTIC VALVE REPLACEMENT  06/16/2002   St Jude Regent Valve - OK for 1.5T or 3T, Normal SAR, < 3000 gauss/cm spatial gradient - S. Draughon RT MRSO  . CHOLECYSTECTOMY N/A 09/08/2013   Procedure: LAPAROSCOPIC CHOLECYSTECTOMY WITH INTRAOPERATIVE CHOLANGIOGRAM;  Surgeon: [name] Hollingshead, MD;  Location: [facility];  Service: General;  Laterality: N/A;  . CORONARY ARTERY BYPASS GRAFT    . KNEE SURGERY    . LEFT HEART CATHETERIZATION WITH CORONARY/GRAFT ANGIOGRAM N/A 09/07/2013   Procedure: LEFT HEART CATHETERIZATION WITH [name];  Surgeon: [name] Grooms, MD;  Location: [facility] CATH LAB;  Service: Cardiovascular;  Laterality: N/A;  . RADIOFREQUENCY ABLATION KIDNEY     RIGHT (two surgeries)  . Thoracic T12 compression fracture     Social history:  reports that he quit smoking about 39 years ago. His smoking use included Cigarettes. He has a 40.00 pack-year smoking history. He has never  used smokeless tobacco. He reports that he does not drink alcohol or use drugs.  No Known Allergies  Family History  Problem Relation [name] of Onset  . Hypertension Mother   . Diabetes Mother   . Hypertension Sister   . Diabetes Sister   . Hypertension Brother   . Diabetes Brother     Prior to Admission medications   Medication Sig Start Date End Date Taking? Authorizing Provider  aspirin EC 81 MG EC tablet Take 1 tablet (81 mg total) by mouth daily. 09/11/13  Yes [name], [name] Delaine, MD    atorvastatin (LIPITOR) 40 MG tablet Take 40 mg by mouth daily. 06/14/15  Yes [provider]  carvedilol (COREG) 25 MG tablet TAKE 1 TABLET BY MOUTH TWICE DAILY WITH MEALS 01/09/17  Yes [name] Hector, MD  digoxin (DIGOX) 0.125 MG tablet Take 1 tablet (125 mcg total) by mouth daily. 05/16/16  Yes [name] Hector, MD  escitalopram (LEXAPRO) 10 MG tablet Take 10 mg by mouth daily.   Yes [provider]  ferrous sulfate 325 (65 FE) MG EC tablet Take 325 mg by mouth daily with breakfast.   Yes [provider]  ramipril (ALTACE) 10 MG capsule Take 10 mg by mouth 2 (two) times daily.   Yes [provider]  risperiDONE (RISPERDAL) 0.5 MG tablet Take 1 tablet (0.5 mg total) by mouth 3 (three) times daily. 01/31/17  Yes [name] Age, MD  torsemide (DEMADEX) 10 MG tablet TAKE 1 TABLET BY MOUTH TWICE DAILY 02/22/17  Yes [name] Hector, MD  traZODone (DESYREL) 50 MG tablet Take 1 tablet (50 mg total) by mouth as needed. Patient taking differently: Take 50 mg by mouth at bedtime as needed for sleep.  01/31/17  Yes [name] Age, MD  warfarin (COUMADIN) 4 MG tablet Take 8 mg by mouth at bedtime.    Yes [provider]  warfarin (COUMADIN) 5 MG tablet Take as directed by coumadin clinic Patient not taking: Reported on 03/08/2017 08/28/16   [name] Hector, MD    Physical Exam: Vitals:   03/08/17 1036 03/08/17 1200 03/08/17 1300 03/08/17 1409  BP:  (!) 142/77  (!) 176/74  Pulse:  80 (!) 36 (!) 52  Resp:  20 18 (!) 22  Temp:      TempSrc:      SpO2:  95% 95% 97%  Weight: 104.3 kg (230 lb)     Height:        Constitutional: Pleasant but somewhat agitated, not in acute distress Vitals:   03/08/17 1036 03/08/17 1200 03/08/17 1300 03/08/17 1409  BP:  (!) 142/77  (!) 176/74  Pulse:  80 (!) 36 (!) 52  Resp:  20 18 (!) 22  Temp:      TempSrc:      SpO2:  95% 95% 97%  Weight: 104.3 kg (230 lb)     Height:       Eyes: PERRL, lids and conjunctivae normal ENMT:  Mucous membranes are moist. Posterior pharynx clear of any exudate or lesions.Normal dentition.  Neck: normal, supple, no masses, no thyromegaly Respiratory: Normal respiratory effort. No accessory muscle use.  Cardiovascular: Irregular rate and rhythm, no rubs / gallops. 2+ pedal pulses. No carotid bruits.  Abdomen: no tenderness, no masses palpated. No hepatosplenomegaly. Bowel sounds positive.  Musculoskeletal: no clubbing / cyanosis. No joint deformity upper and lower extremities. Good ROM, no contractures. Normal muscle tone.  Skin: no rashes Neurologic: CN 2-12 grossly intact. Moving all 4 extremities  spontaneously and against gravity, alert to name only Psychiatric: Slightly agitated during the interview, able to follow commands, pleasant  Labs on Admission: I have personally reviewed following labs and imaging studies  CBC:  Recent Labs Lab 03/08/17 1054  WBC 9.0  NEUTROABS 6.8  HGB 12.3*  HCT 35.4*  MCV 83.9  PLT 850   Basic Metabolic Panel:  Recent Labs Lab 03/08/17 1054  NA 138  K 3.6  CL 101  CO2 28  GLUCOSE 136*  BUN 19  CREATININE 1.06  CALCIUM 9.5   Liver Function Tests:  Recent Labs Lab 03/08/17 1054  AST 16  ALT 14*  ALKPHOS 88  BILITOT 1.3*  PROT 6.7  ALBUMIN 3.7    Recent Labs Lab 03/08/17 1054  LIPASE 18   Coagulation Profile:  Recent Labs Lab 03/08/17 1054  INR 1.89   Urine analysis:    Component Value Date/Time   COLORURINE YELLOW 03/08/2017 1408   APPEARANCEUR CLEAR 03/08/2017 1408   LABSPEC 1.017 03/08/2017 1408   PHURINE 5.0 03/08/2017 1408   GLUCOSEU NEGATIVE 03/08/2017 1408   HGBUR NEGATIVE 03/08/2017 1408   BILIRUBINUR NEGATIVE 03/08/2017 1408   KETONESUR 5 (A) 03/08/2017 1408   PROTEINUR NEGATIVE 03/08/2017 1408   UROBILINOGEN 0.2 09/01/2013 1301   NITRITE NEGATIVE 03/08/2017 1408   LEUKOCYTESUR NEGATIVE 03/08/2017 1408   Recent Results (from the past 240 hour(s))  C difficile quick scan w PCR reflex      Status: None   Collection Time: 03/08/17 10:55 AM  Result Value Ref Range Status   C Diff antigen NEGATIVE NEGATIVE Final   C Diff toxin NEGATIVE NEGATIVE Final   C Diff interpretation No C. difficile detected.  Final     Radiological Exams on Admission: Dg Chest 2 View  Result Date: 03/08/2017 CLINICAL DATA:  Weakness EXAM: CHEST  2 VIEW COMPARISON:  09/09/2016 FINDINGS: There is no focal parenchymal opacity. There is no pleural effusion or pneumothorax. There is stable cardiomegaly. There is evidence of prior CABG. The osseous structures are unremarkable. IMPRESSION: No active cardiopulmonary disease. Electronically Signed   By: Kathreen Devoid   On: 03/08/2017 11:26    EKG: Pending  Assessment/Plan Active Problems: Acute metabolic encephalopathy - Likely secondary to dehydration in the setting of diarrhea, imposed on known dementia - Placed on IV fluids - Will need physical therapy evaluation once medically stable  GI bleed - FOBT positive - Possibly exacerbated by use of Coumadin - We'll check PT/INR, hold Coumadin for now - Repeat CBC in the morning - If drop in hemoglobin noted and persistent blood in stool, consider GI consultation  Diarrhea - Possible viral etiology - Patient did eat some chicken but it is unclear if this could have potentially caused diarrhea - We will obtain stool panel as well as C. Difficile - Hold off on antidiarrheal medications for now  Artificial aortic valve, atrial fibrillation - On Coumadin - We'll hold Coumadin for now giving GI bleed - Keep on telemetry - Continue Coreg and digoxin the patient takes at home  Chronic diastolic CHF - On torsemide at home but we will hold here temporarily - Monitor daily weights, strict intake and output - Provide gentle hydration for 12 hours and reassess clinical status in the morning - Resume torsemide as clinically appropriate   Dementia - Obtain physical therapy evaluation once patient more  medically stable  DVT prophylaxis: SCDs Code Status: Full Family Communication: Pt and family updated at bedside Disposition Plan: Admit to telemetry  unit Consults called: None Admission status: Observation  Faye Ramsay MD [facility] Pager [phone]  If 7PM-7AM, please contact night-coverage [email or web address] Password [facility]  03/08/2017, 3:17 PM

## 2017-03-08 NOTE — Progress Notes (Addendum)
Pt had a soft formed dark large stool.

## 2017-03-08 NOTE — Progress Notes (Signed)
[place] for Warfarin Indication: AVR, hx Afib on chronic Warfarin  No Known Allergies  Patient Measurements: Height: 6\' 4"  (193 cm) Weight: 230 lb (104.3 kg) IBW/kg (Calculated) : 86.8  Vital Signs: Temp: 98 F (36.7 C) (07/13 1034) Temp Source: Oral (07/13 1034) BP: 151/91 (07/13 1430) Pulse Rate: 69 (07/13 1430)  Labs:  Recent Labs  03/08/17 1054  HGB 12.3*  HCT 35.4*  PLT 184  LABPROT 22.0*  INR 1.89  CREATININE 1.06   Estimated Creatinine Clearance: 72.5 mL/min (by C-G formula based on SCr of 1.06 mg/dL).  Medical History: Past Medical History:  Diagnosis Date  . Aortic valve replaced   . Arthritis   . Atrial fibrillation ([place])    longterm persistent  . Back pain, chronic    SINCE BACK INJURY / FRACTURE  . BPH (benign prostatic hyperplasia)    grapey  . CAD (coronary artery disease)   . Cancer (HCC)    RIGHT RENAL  . Constipation   . Cough 06/16/12   C/O OF COUGH / COLD FOR A COUPLE OF WEEKS  . Dementia   . Diverticulitis    history  . Elevated fasting glucose 2012  . GERD (gastroesophageal reflux disease)   . HTN (hypertension)   . Hyperlipidemia   . Hypokalemia   . Kidney disorder    ablation defect in the right kidney lower pole but progression of interpolar lesion: stable on repeat CT (06/2014)  . Right renal mass    renal cell carcinoma  . Shortness of breath    PT STATES RELATED TO HIS AF AND HEART BEING OUT OF RHYTHM  . Stomach ulcer    Medications:  Scheduled:  . atorvastatin  40 mg Oral Daily  . carvedilol  25 mg Oral BID WC  . cholecalciferol  1,000 Units Oral Daily  . digoxin  125 mcg Oral Daily  . escitalopram  10 mg Oral Daily  . [START ON 03/09/2017] ferrous sulfate  325 mg Oral Q breakfast  . ramipril  10 mg Oral BID  . risperiDONE  0.5 mg Oral TID  . sodium chloride flush  3 mL Intravenous Q12H  . vitamin B-12  1,000 mcg Oral Daily   Assessment: 83 yoM to ED 7/13 for increasing  weakness, decreasing po intake. Diarrhea noted, possible dark blood noted, no BRB in stool.   PMH: AVR, hx Afib on chronic Warf - 8mg  qd, LD 7/12, goal INR 2-3 per Coumadin clinic note HTN, HPL, BPH, dementia with caregiver at home.  Baseline INR today = 1.89, MD desires no Warfarin today per protocol request Hgb 12.3/ Hct 35.4, Plt wnl  Goal of Therapy:  INR 2-3 Monitor platelets by anticoagulation protocol: Yes   Plan:   No Warfarin today  Daily PT/INR ordered  Minda Ditto PharmD Pager [phone] 03/08/2017, 5:24 PM

## 2017-03-08 NOTE — ED Notes (Signed)
Bed: QA44 Expected date:  Expected time:  Means of arrival:  Comments: EMS 83 y/o off balance for a month

## 2017-03-08 NOTE — ED Notes (Signed)
Attempted IV acces x 2, without any success.   Lilia Pro RN will attempt with ultrasound.

## 2017-03-09 DIAGNOSIS — I5032 Chronic diastolic (congestive) heart failure: Secondary | ICD-10-CM

## 2017-03-09 DIAGNOSIS — K922 Gastrointestinal hemorrhage, unspecified: Secondary | ICD-10-CM

## 2017-03-09 DIAGNOSIS — I482 Chronic atrial fibrillation: Secondary | ICD-10-CM | POA: Diagnosis not present

## 2017-03-09 DIAGNOSIS — F0151 Vascular dementia with behavioral disturbance: Secondary | ICD-10-CM

## 2017-03-09 LAB — BASIC METABOLIC PANEL
Anion gap: 5 (ref 5–15)
BUN: 17 mg/dL (ref 6–20)
CHLORIDE: 105 mmol/L (ref 101–111)
CO2: 28 mmol/L (ref 22–32)
CREATININE: 1 mg/dL (ref 0.61–1.24)
Calcium: 9.1 mg/dL (ref 8.9–10.3)
GFR calc non Af Amer: >60 mL/min (ref >60)
GLUCOSE: 115 mg/dL — AB (ref 65–99)
Potassium: 3.5 mmol/L (ref 3.5–5.1)
Sodium: 138 mmol/L (ref 135–145)

## 2017-03-09 LAB — URINE CULTURE

## 2017-03-09 LAB — PROTIME-INR
INR: 2.06
Prothrombin Time: 23.6 seconds — ABNORMAL HIGH (ref 11.4–15.2)

## 2017-03-09 LAB — CBC
HCT: 33.2 % — ABNORMAL LOW (ref 39.0–52.0)
Hemoglobin: 11.5 g/dL — ABNORMAL LOW (ref 13.0–17.0)
MCH: 28.9 pg (ref 26.0–34.0)
MCHC: 34.6 g/dL (ref 30.0–36.0)
MCV: 83.4 fL (ref 78.0–100.0)
PLATELETS: 171 10*3/uL (ref 150–400)
RBC: 3.98 MIL/uL — AB (ref 4.22–5.81)
RDW: 12.7 % (ref 11.5–15.5)
WBC: 7.8 10*3/uL (ref 4.0–10.5)

## 2017-03-09 LAB — DIGOXIN LEVEL: Digoxin Level: 0.5 ng/mL — ABNORMAL LOW (ref 0.8–2.0)

## 2017-03-09 MED ORDER — WARFARIN SODIUM 4 MG PO TABS
4.0000 mg | ORAL_TABLET | Freq: Once | ORAL | Status: AC
  Administered 2017-03-09: 4 mg via ORAL
  Filled 2017-03-09: qty 1

## 2017-03-09 MED ORDER — CARVEDILOL 12.5 MG PO TABS
12.5000 mg | ORAL_TABLET | Freq: Two times a day (BID) | ORAL | Status: DC
  Administered 2017-03-09 – 2017-03-10 (×2): 12.5 mg via ORAL
  Filled 2017-03-09 (×2): qty 1

## 2017-03-09 MED ORDER — WARFARIN - PHARMACIST DOSING INPATIENT
Freq: Every day | Status: DC
  Administered 2017-03-09: 18:00:00

## 2017-03-09 MED ORDER — LORAZEPAM 0.5 MG PO TABS
0.5000 mg | ORAL_TABLET | Freq: Once | ORAL | Status: AC
  Administered 2017-03-09: 0.5 mg via ORAL
  Filled 2017-03-09: qty 1

## 2017-03-09 MED ORDER — PANTOPRAZOLE SODIUM 40 MG IV SOLR
40.0000 mg | Freq: Two times a day (BID) | INTRAVENOUS | Status: DC
  Administered 2017-03-09 – 2017-03-10 (×3): 40 mg via INTRAVENOUS
  Filled 2017-03-09 (×3): qty 40

## 2017-03-09 NOTE — Progress Notes (Signed)
PROGRESS NOTE    [name]  HOZ:[id number] DOB: [birth year] DOA: 03/08/2017 PCP: Cari Caraway, MD  Brief Narrative:[name] is a 83 y.o. male with P.atrial fibrillation, artificial aortic valve, on chronic Coumadin, hypertension, hyperlipidemia, BPH. Patient presented to emergency department with several days duration of progressively worsening weakness and some falls, and dark stools yesterday, heme positive, Hb 12   Assessment & Plan:   Principal Problem:   Mild GI bleed -no evidence of active bleeding, hemoccult positive, but Hb in 11.5-12 range which is his baseline -had a formed dark stool this am, takes Iron daily and suspect this is contributing to the color -discussed with son and daughter-PoAs, they want to hold off on agressive workup/intervention, due to age/dementia, unless he becomes unstable, which I agree with, no plan for GI consult/EGD at this time -keep on PPI today, hold Iron -CBC in am -resume Warfarin due to mechanical valve, without destabilizing bleeding risks of holding coumadin outweight benefits in his case, family understands this and agrees  Mechanical AVR -resume coumadin, will not bridge since INR is 2 and ? of mild GI bleed   P AFib -HR in 40-50range with pauses -hold Digoxin, check level, cut down Coreg in half to 12.5mg  BID, monitor on tele   Dementia with behavioral issues -on risperidone TID, this contributes to this daytime somnolence but per family cannot be managed without this -will continue -Pt/Ot eval, family declines SNF, they are interested in HHPT/OT, pt lives with wife and has caregivers    Chronic diastolic heart failure ([place]) -stable, torsemide on hold   IBS with some baseline diarrhea -monitor  DVT prophylaxis: SCDs Code Status: Full COde Family Communication: son and daughter at bedside Disposition Plan: Home tomorrow if stable    Subjective: Feels ok, no bleeding, had a formed stool this  am  Objective: Vitals:   03/08/17 1715 03/08/17 2010 03/09/17 0523 03/09/17 0813  BP: (!) 152/88 (!) 175/75 (!) 176/91 (!) 158/79  Pulse: 66 83 88 66  Resp: 20 18 18    Temp: 98.2 F (36.8 C) 98.1 F (36.7 C) 98.3 F (36.8 C) 97.6 F (36.4 C)  TempSrc: Oral Oral Oral Oral  SpO2: 100% 95% 97% 97%  Weight: 104.3 kg (230 lb)  104.9 kg (231 lb 4.2 oz)   Height: 6\' 4"  (1.93 m)       Intake/Output Summary (Last 24 hours) at 03/09/17 1131 Last data filed at 03/09/17 0200  Gross per 24 hour  Intake          1248.75 ml  Output                0 ml  Net          1248.75 ml   Filed Weights   03/08/17 1036 03/08/17 1715 03/09/17 0523  Weight: 104.3 kg (230 lb) 104.3 kg (230 lb) 104.9 kg (231 lb 4.2 oz)    Examination:  General exam: Appears calm and comfortable, resting, oriented to self and partly to place, knows [facility] Respiratory system: Clear to auscultation. Respiratory effort normal. Cardiovascular system: S1 & S2 heard, RRR.  Gastrointestinal system: Abdomen is nondistended, soft and nontender. Normal bowel sounds heard. Central nervous system: Alert and oriented. No focal neurological deficits. Extremities: Symmetric 5 x 5 power. Skin: No rashes, lesions or ulcers Psychiatry: mild confusion, pleasant    Data Reviewed:   CBC:  Recent Labs Lab 03/08/17 1054 03/09/17 0452  WBC 9.0 7.8  NEUTROABS 6.8  --  HGB 12.3* 11.5*  HCT 35.4* 33.2*  MCV 83.9 83.4  PLT 184 967   Basic Metabolic Panel:  Recent Labs Lab 03/08/17 1054 03/09/17 0452  NA 138 138  K 3.6 3.5  CL 101 105  CO2 28 28  GLUCOSE 136* 115*  BUN 19 17  CREATININE 1.06 1.00  CALCIUM 9.5 9.1   GFR: Estimated Creatinine Clearance: 77 mL/min (by C-G formula based on SCr of 1 mg/dL). Liver Function Tests:  Recent Labs Lab 03/08/17 1054  AST 16  ALT 14*  ALKPHOS 88  BILITOT 1.3*  PROT 6.7  ALBUMIN 3.7    Recent Labs Lab 03/08/17 1054  LIPASE 18   No results for input(s): AMMONIA  in the last 168 hours. Coagulation Profile:  Recent Labs Lab 03/08/17 1054 03/08/17 1731 03/09/17 0452  INR 1.89 1.98 2.06   Cardiac Enzymes: No results for input(s): CKTOTAL, CKMB, CKMBINDEX, TROPONINI in the last 168 hours. BNP (last 3 results) No results for input(s): PROBNP in the last 8760 hours. HbA1C: No results for input(s): HGBA1C in the last 72 hours. CBG: No results for input(s): GLUCAP in the last 168 hours. Lipid Profile: No results for input(s): CHOL, HDL, LDLCALC, TRIG, CHOLHDL, LDLDIRECT in the last 72 hours. Thyroid Function Tests:  Recent Labs  03/08/17 1731  TSH 1.458   Anemia Panel: No results for input(s): VITAMINB12, FOLATE, FERRITIN, TIBC, IRON, RETICCTPCT in the last 72 hours. Urine analysis:    Component Value Date/Time   COLORURINE YELLOW 03/08/2017 1408   APPEARANCEUR CLEAR 03/08/2017 1408   LABSPEC 1.017 03/08/2017 1408   PHURINE 5.0 03/08/2017 1408   GLUCOSEU NEGATIVE 03/08/2017 1408   HGBUR NEGATIVE 03/08/2017 1408   BILIRUBINUR NEGATIVE 03/08/2017 1408   KETONESUR 5 (A) 03/08/2017 1408   PROTEINUR NEGATIVE 03/08/2017 1408   UROBILINOGEN 0.2 09/01/2013 1301   NITRITE NEGATIVE 03/08/2017 1408   LEUKOCYTESUR NEGATIVE 03/08/2017 1408   Sepsis Labs: @LABRCNTIP (procalcitonin:4,lacticidven:4)  ) Recent Results (from the past 240 hour(s))  C difficile quick scan w PCR reflex     Status: None   Collection Time: 03/08/17 10:55 AM  Result Value Ref Range Status   C Diff antigen NEGATIVE NEGATIVE Final   C Diff toxin NEGATIVE NEGATIVE Final   C Diff interpretation No C. difficile detected.  Final         Radiology Studies: Dg Chest 2 View  Result Date: 03/08/2017 CLINICAL DATA:  Weakness EXAM: CHEST  2 VIEW COMPARISON:  09/09/2016 FINDINGS: There is no focal parenchymal opacity. There is no pleural effusion or pneumothorax. There is stable cardiomegaly. There is evidence of prior CABG. The osseous structures are unremarkable.  IMPRESSION: No active cardiopulmonary disease. Electronically Signed   By: Kathreen Devoid   On: 03/08/2017 11:26        Scheduled Meds: . atorvastatin  40 mg Oral Daily  . carvedilol  12.5 mg Oral BID WC  . cholecalciferol  1,000 Units Oral Daily  . escitalopram  10 mg Oral Daily  . pantoprazole (PROTONIX) IV  40 mg Intravenous Q12H  . ramipril  10 mg Oral BID  . risperiDONE  0.5 mg Oral TID  . sodium chloride flush  3 mL Intravenous Q12H  . vitamin B-12  1,000 mcg Oral Daily  . warfarin  4 mg Oral ONCE-1800  . Warfarin - Pharmacist Dosing Inpatient   Does not apply q1800   Continuous Infusions:   LOS: 0 days    Time spent: 58min    Domenic Polite,  MD [facility] Pager [phone]  If 7PM-7AM, please contact night-coverage [email or web address] Password [facility] 03/09/2017, 11:31 AM

## 2017-03-09 NOTE — Progress Notes (Signed)
[place] for Warfarin Indication: AVR, hx Afib on chronic Warfarin  No Known Allergies  Patient Measurements: Height: 6\' 4"  (193 cm) Weight: 231 lb 4.2 oz (104.9 kg) IBW/kg (Calculated) : 86.8  Vital Signs: Temp: 97.6 F (36.4 C) (07/14 0813) Temp Source: Oral (07/14 0813) BP: 158/79 (07/14 0813) Pulse Rate: 66 (07/14 0813)  Labs:  Recent Labs  03/08/17 1054 03/08/17 1731 03/09/17 0452  HGB 12.3*  --  11.5*  HCT 35.4*  --  33.2*  PLT 184  --  171  LABPROT 22.0* 22.8* 23.6*  INR 1.89 1.98 2.06  CREATININE 1.06  --  1.00   Estimated Creatinine Clearance: 77 mL/min (by C-G formula based on SCr of 1 mg/dL).  Medical History: Past Medical History:  Diagnosis Date  . Aortic valve replaced   . Arthritis   . Atrial fibrillation ([place])    longterm persistent  . Back pain, chronic    SINCE BACK INJURY / FRACTURE  . BPH (benign prostatic hyperplasia)    grapey  . CAD (coronary artery disease)   . Cancer ([facility])    RIGHT RENAL  . Constipation   . Cough 06/16/12   C/O OF COUGH / COLD FOR A COUPLE OF WEEKS  . Dementia   . Diverticulitis    history  . Elevated fasting glucose 2012  . GERD (gastroesophageal reflux disease)   . HTN (hypertension)   . Hyperlipidemia   . Hypokalemia   . Kidney disorder    ablation defect in the right kidney lower pole but progression of interpolar lesion: stable on repeat CT (06/2014)  . Right renal mass    renal cell carcinoma  . Shortness of breath    PT STATES RELATED TO HIS AF AND HEART BEING OUT OF RHYTHM  . Stomach ulcer    Medications:  Scheduled:  . atorvastatin  40 mg Oral Daily  . carvedilol  12.5 mg Oral BID WC  . cholecalciferol  1,000 Units Oral Daily  . escitalopram  10 mg Oral Daily  . pantoprazole (PROTONIX) IV  40 mg Intravenous Q12H  . ramipril  10 mg Oral BID  . risperiDONE  0.5 mg Oral TID  . sodium chloride flush  3 mL Intravenous Q12H  . vitamin B-12  1,000 mcg Oral  Daily   Assessment: 83 yoM to ED 7/13 for increasing weakness, decreasing po intake. Diarrhea noted, possible dark blood noted, no BRB in stool.   PMH: AVR, hx  Afib on chronic Warf - 8mg  qd, LD 7/12, goal INR 2-3 per Coumadin clinic note HTN, HPL, BPH, dementia with caregiver at home.  Today 03/09/17  INR is 2.06, Pt 23.6   Hgb 11.5, stable, plt 171   Pt did not receive dose on 7/13 as per MD request   Today, MD requests warfarin to be restarted    Goal of Therapy:  INR 2-3 Monitor platelets by anticoagulation protocol: Yes   Plan:   Warfarin 4 mg PO today as INR increased from 1.89 to 2.06 without any dose on 7/13  Daily PT/INR ordered  Monitor for signs and symptoms of bleeding    Royetta Asal, PharmD, BCPS Pager [phone] 03/09/2017 11:24 AM

## 2017-03-10 DIAGNOSIS — F0151 Vascular dementia with behavioral disturbance: Secondary | ICD-10-CM | POA: Diagnosis not present

## 2017-03-10 DIAGNOSIS — I482 Chronic atrial fibrillation: Secondary | ICD-10-CM | POA: Diagnosis not present

## 2017-03-10 DIAGNOSIS — K922 Gastrointestinal hemorrhage, unspecified: Secondary | ICD-10-CM | POA: Diagnosis not present

## 2017-03-10 DIAGNOSIS — I5032 Chronic diastolic (congestive) heart failure: Secondary | ICD-10-CM | POA: Diagnosis not present

## 2017-03-10 LAB — CBC
HCT: 32.4 % — ABNORMAL LOW (ref 39.0–52.0)
Hemoglobin: 11.2 g/dL — ABNORMAL LOW (ref 13.0–17.0)
MCH: 29.5 pg (ref 26.0–34.0)
MCHC: 34.6 g/dL (ref 30.0–36.0)
MCV: 85.3 fL (ref 78.0–100.0)
Platelets: 190 K/uL (ref 150–400)
RBC: 3.8 MIL/uL — ABNORMAL LOW (ref 4.22–5.81)
RDW: 12.9 % (ref 11.5–15.5)
WBC: 8.1 K/uL (ref 4.0–10.5)

## 2017-03-10 LAB — PROTIME-INR
INR: 1.9
Prothrombin Time: 22 seconds — ABNORMAL HIGH (ref 11.4–15.2)

## 2017-03-10 LAB — BASIC METABOLIC PANEL WITH GFR
Anion gap: 5 (ref 5–15)
BUN: 14 mg/dL (ref 6–20)
CO2: 28 mmol/L (ref 22–32)
Calcium: 9.2 mg/dL (ref 8.9–10.3)
Chloride: 106 mmol/L (ref 101–111)
Creatinine, Ser: 0.93 mg/dL (ref 0.61–1.24)
GFR calc Af Amer: >60 mL/min
GFR calc non Af Amer: >60 mL/min
Glucose, Bld: 118 mg/dL — ABNORMAL HIGH (ref 65–99)
Potassium: 3.5 mmol/L (ref 3.5–5.1)
Sodium: 139 mmol/L (ref 135–145)

## 2017-03-10 MED ORDER — DIGOXIN 125 MCG PO TABS
0.1250 mg | ORAL_TABLET | Freq: Every day | ORAL | Status: DC
  Administered 2017-03-10: 0.125 mg via ORAL
  Filled 2017-03-10: qty 1

## 2017-03-10 MED ORDER — BENZOCAINE 10 % MT GEL
Freq: Two times a day (BID) | OROMUCOSAL | Status: DC | PRN
  Administered 2017-03-10: 10:00:00 via OROMUCOSAL
  Filled 2017-03-10: qty 9.4

## 2017-03-10 MED ORDER — HYDRALAZINE HCL 20 MG/ML IJ SOLN
10.0000 mg | Freq: Four times a day (QID) | INTRAMUSCULAR | Status: DC | PRN
  Filled 2017-03-10: qty 0.5

## 2017-03-10 MED ORDER — LORAZEPAM 2 MG/ML IJ SOLN
0.5000 mg | Freq: Once | INTRAMUSCULAR | Status: AC
  Administered 2017-03-10: 0.5 mg via INTRAVENOUS
  Filled 2017-03-10: qty 1

## 2017-03-10 MED ORDER — PANTOPRAZOLE SODIUM 40 MG PO TBEC: 40.0000 mg | DELAYED_RELEASE_TABLET | Freq: Every day | ORAL | 0 refills | Status: DC

## 2017-03-10 MED ORDER — WARFARIN SODIUM 4 MG PO TABS
8.0000 mg | ORAL_TABLET | Freq: Once | ORAL | Status: DC
Start: 2017-03-10 — End: 2017-03-10
  Filled 2017-03-10: qty 2

## 2017-03-10 MED ORDER — AMLODIPINE BESYLATE 10 MG PO TABS: 10.0000 mg | ORAL_TABLET | Freq: Every day | ORAL | 0 refills | Status: AC

## 2017-03-10 MED ORDER — HYDRALAZINE HCL 25 MG PO TABS
25.0000 mg | ORAL_TABLET | ORAL | Status: AC
  Administered 2017-03-10: 25 mg via ORAL
  Filled 2017-03-10: qty 1

## 2017-03-10 MED ORDER — TORSEMIDE 20 MG PO TABS
20.0000 mg | ORAL_TABLET | Freq: Every day | ORAL | Status: DC
  Administered 2017-03-10: 20 mg via ORAL
  Filled 2017-03-10: qty 1

## 2017-03-10 MED ORDER — WARFARIN SODIUM 4 MG PO TABS: 8.0000 mg | ORAL_TABLET | Freq: Every day | ORAL | Status: AC

## 2017-03-10 NOTE — Progress Notes (Signed)
Waiting on CM to see/call pt's family for [facility]

## 2017-03-10 NOTE — Care Management Note (Signed)
Case Management Note  Patient Details  Name: [name] MRN: [id number] Date of Birth: [birth year]  Subjective/Objective:                 Spoke w patient's son on the phone. He would like to use a private company to do [facility] PT that he already uses for [facility]. Requested copy of order to give to them. CM instructed nurse on how to print it out to give them with their DC instructions. No other CM needs identified.    Action/Plan:   Expected Discharge Date:  03/10/17               Expected Discharge Plan:  Home/Self Care  In-House Referral:     Discharge planning Services  CM Consult  Post Acute Care Choice:  Home Health Choice offered to:  Adult Children  DME Arranged:    DME Agency:     HH Arranged:    HH Agency:     Status of Service:  Completed, signed off  If discussed at [place] of Stay Meetings, dates discussed:    Additional Comments:  Carles Collet, RN 03/10/2017, 12:46 PM

## 2017-03-10 NOTE — Progress Notes (Signed)
Paged MD Threasa Alpha about pts elevated BP. Awaiting response/orders.

## 2017-03-10 NOTE — Evaluation (Signed)
Physical Therapy Evaluation Patient Details Name: [name] MRN: [id number] DOB: 28-May-1934 Today's Date: 03/10/2017   History of Present Illness  Pt admitted with GIB and bowel incontinence and with hx of CAD, aortic valve replacement, CAB, Dementia and chronic back pain  Clinical Impression  Pt admitted as above and presenting with functional mobility limitations 2* generalized weakness, dementia related cognitive deficits, balance deficits and limited endurance.  Pt's son present and family unwilling to consider follow up rehab at SNF level but son states he will work on 24/7 assist and that they are working toward different living arrangement for parents.  Pt would benefit from follow up HHPT to further address deficits.    Follow Up Recommendations Home health PT;Supervision/Assistance - 24 hour    Equipment Recommendations  None recommended by PT    Recommendations for Other Services OT consult     Precautions / Restrictions Precautions Precautions: Fall Restrictions Weight Bearing Restrictions: No      Mobility  Bed Mobility Overal bed mobility: Needs Assistance Bed Mobility: Supine to Sit     Supine to sit: Min assist     General bed mobility comments: min assist of son  Transfers Overall transfer level: Needs assistance Equipment used: Rolling walker (2 wheeled) Transfers: Sit to/from Stand Sit to Stand: Min assist         General transfer comment: min assist to bring wt up and fwd and to maintain balance in standing  Ambulation/Gait Ambulation/Gait assistance: Min assist;Mod assist Ambulation Distance (Feet): 30 Feet Assistive device: Rolling walker (2 wheeled) Gait Pattern/deviations: Step-to pattern;Step-through pattern;Decreased step length - right;Decreased step length - left;Shuffle;Trunk flexed Gait velocity: decr Gait velocity interpretation: Below normal speed for age/gender General Gait Details: Cues for posture and position from RW.  Pt  determined to extend RW far infront and catch up with it - increased antalgic gait and instability as a result.  Son reports this is normal for pt  Financial trader [employer] (Stroke Patients Only)       Balance Overall balance assessment: Needs assistance Sitting-balance support: No upper extremity supported;Feet supported Sitting balance-Leahy Scale: Good     Standing balance support: Bilateral upper extremity supported Standing balance-Leahy Scale: Poor                               Pertinent Vitals/Pain Pain Assessment: No/denies pain    Home Living Family/patient expects to be discharged to:: Private residence Living Arrangements: Spouse/significant other Available Help at Discharge: Family;Personal care attendant;Other (Comment) Type of Home: House Home Access: Ramped entrance     Home Layout: One level Home Equipment: Walker - 2 wheels Additional Comments: Per son, pt and spouse have assist MWF 10-2 as well as assist from himself and his sister.      Prior Function Level of Independence: Needs assistance   Gait / Transfers Assistance Needed: RW  ADL's / Homemaking Assistance Needed: assist of family and aides        Hand Dominance        Extremity/Trunk Assessment   Upper Extremity Assessment Upper Extremity Assessment: Generalized weakness    Lower Extremity Assessment Lower Extremity Assessment: Generalized weakness       Communication   Communication: No difficulties  Cognition Arousal/Alertness: Awake/alert Behavior During Therapy: WFL for tasks assessed/performed Overall Cognitive Status: History of cognitive impairments - at  baseline                                        General Comments      Exercises     Assessment/Plan    PT Assessment Patient needs continued PT services  PT Problem List Decreased strength;Decreased activity tolerance;Decreased  balance;Decreased mobility;Decreased cognition;Decreased knowledge of use of DME       PT Treatment Interventions DME instruction;Gait training;Functional mobility training;Therapeutic activities;Therapeutic exercise;Balance training;Cognitive remediation;Patient/family education    PT Goals (Current goals can be found in the Care Plan section)  Acute Rehab PT Goals Patient Stated Goal: Home PT Goal Formulation: With patient/family Time For Goal Achievement: 03/23/17 Potential to Achieve Goals: Fair    Frequency Min 3X/week   Barriers to discharge Decreased caregiver support Discussed with pts son who states "we can work on 24/7 assist".  Family unwilling to consider rehab setting 2* previous experience and family situation    Co-evaluation               AM-PAC PT "6 Clicks" Daily Activity  Outcome Measure Difficulty turning over in bed (including adjusting bedclothes, sheets and blankets)?: Total Difficulty moving from lying on back to sitting on the side of the bed? : Total Difficulty sitting down on and standing up from a chair with arms (e.g., wheelchair, bedside commode, etc,.)?: Total Help needed moving to and from a bed to chair (including a wheelchair)?: A Lot Help needed walking in hospital room?: A Lot Help needed climbing 3-5 steps with a railing? : A Lot 6 Click Score: 9    End of Session Equipment Utilized During Treatment: Gait belt Activity Tolerance: Patient limited by fatigue Patient left: in chair;with call bell/phone within reach;with chair alarm set;with family/visitor present Nurse Communication: Mobility status PT Visit Diagnosis: Unsteadiness on feet (R26.81);Difficulty in walking, not elsewhere classified (R26.2)    Time: 2458-0998 PT Time Calculation (min) (ACUTE ONLY): 22 min   Charges:   PT Evaluation $PT Eval Moderate Complexity: 1 Procedure     PT G Codes:   PT G-Codes **NOT FOR INPATIENT CLASS** Functional Assessment Tool Used:  AM-PAC 6 Clicks Basic Mobility Functional Limitation: Mobility: Walking and moving around Mobility: Walking and Moving Around Current Status (P3825): At least 60 percent but less than 80 percent impaired, limited or restricted Mobility: Walking and Moving Around Goal Status [phone]): At least 20 percent but less than 40 percent impaired, limited or restricted    Pg [phone]   Paulino Cork 03/10/2017, 12:44 PM

## 2017-03-10 NOTE — Discharge Summary (Signed)
Physician Discharge Summary  [name] WPY:[id number] DOB: 05/14/1934 DOA: 03/08/2017  PCP: Cari Caraway, MD  Admit date: 03/08/2017 Discharge date: 03/10/2017  Time spent: 35 minutes  Recommendations for Outpatient Follow-up:  1. PCP in 1 week, Coreg dose cut down due to bradycardia and started Amlodipine for uncontrolled HTN 2. INR check in 4days 3. Home health PT   Discharge Diagnoses:    Weakness   Mild anemia/heme positive stools   Essential hypertension   Atrial fibrillation (HCC)   H/O mechanical aortic valve replacement   Chronic diastolic heart failure (HCC)   Dementia with behavioral disturbance   Discharge Condition: stable  Diet recommendation: heart healthy  Filed Weights   03/08/17 1715 03/09/17 0523 03/10/17 0534  Weight: 104.3 kg (230 lb) 104.9 kg (231 lb 4.2 oz) 104.7 kg (230 lb 13.2 oz)    History of present illness:  [name] Orvis Tuggleis a 83 y.o.malewith P.atrial fibrillation, artificial aortic valve,on chronic Coumadin, hypertension, hyperlipidemia, BPH. Patient presented to emergency department with several days duration of progressively worsening weakness and some falls, and dark stool while on Iron but heme positive and hemoglobin of 12  Hospital Course:  Mild GI bleed -no evidence of active bleeding, hemoccult positive, but Hb in 11.5-12 range which is his baseline -had a formed dark stool yesterday am and none since then, takes Iron daily and suspect this is contributing to the color -discussed with son and daughter-PoAs, they want to hold off on agressive workup/intervention, due to age/dementia, unless he becomes unstable, which I agree with, hence we did not pursue GI consult/EGD, no further bleeding on warfarin, and hemoglobin remained stable -started on PPI, advised family to resume Iron in 1 week -INR 2.0 today, needs INR check in 4-5days -please check CBC at FU in 1 week  Mechanical AVR -resumed coumadin, did not bridge since INR  is almost 2 -recheck INR in 4days   P AFib -HR in 40-50range with pauses yesterday am hence cut down coreg to 12.5mg  BID from 25mg  BID which is his home dose, digoxin resumed level in low normal range -HR improved in 60-75 range now   Dementia with behavioral issues -on risperidone TID, this contributes to this daytime somnolence but per family his agitation cannot be managed without this -will continue -Pt/Ot eval completed family declines SNF, they are interested in HHPT/OT, pt lives with wife and has caregivers    Chronic diastolic heart failure ([place]) -stable, torsemide on resumed   Hypertension -BP not well controlled -resumed ramipril, added amlodipine   IBS with some baseline diarrhea -stable   Consultations:  Pt eval  Discharge Exam: Vitals:   03/10/17 1019 03/10/17 1137  BP:  (!) 176/98  Pulse: 72 78  Resp:    Temp:      General: AAOx2 Cardiovascular: S1S2/RRR Respiratory: CTAB  Discharge Instructions   Discharge Instructions    Diet - low sodium heart healthy    Complete by:  As directed    Increase activity slowly    Complete by:  As directed      Discharge Medication List as of 03/10/2017  1:11 PM    START taking these medications   Details  amLODipine (NORVASC) 10 MG tablet Take 1 tablet (10 mg total) by mouth daily., Starting Sun 03/10/2017, Until Mon 03/10/2018, Print    pantoprazole (PROTONIX) 40 MG tablet Take 1 tablet (40 mg total) by mouth daily., Starting Sun 03/10/2017, Print      CONTINUE these medications which have CHANGED  Details  warfarin (COUMADIN) 4 MG tablet Take 2 tablets (8 mg total) by mouth at bedtime. Continue home dose of 8mg , Needs INR check in 4-5days, Starting Sun 03/10/2017, No Print      CONTINUE these medications which have NOT CHANGED   Details  aspirin EC 81 MG EC tablet Take 1 tablet (81 mg total) by mouth daily., Starting Fri 09/11/2013, OTC    atorvastatin (LIPITOR) 40 MG tablet Take 40 mg by mouth  daily., Starting Tue 06/14/2015, Historical Med    carvedilol (COREG) 25 MG tablet TAKE 1 TABLET BY MOUTH TWICE DAILY WITH MEALS, Normal    cholecalciferol (VITAMIN D) 1000 units tablet Take 1,000 Units by mouth daily., Historical Med    digoxin (DIGOX) 0.125 MG tablet Take 1 tablet (125 mcg total) by mouth daily., Starting Wed 05/16/2016, Normal    escitalopram (LEXAPRO) 10 MG tablet Take 10 mg by mouth daily., Historical Med    ferrous sulfate 325 (65 FE) MG EC tablet Take 325 mg by mouth daily with breakfast., Historical Med    nitroGLYCERIN (NITROSTAT) 0.4 MG SL tablet Place 1 tablet (0.4 mg total) under the tongue every 5 (five) minutes as needed. For chest pain, Starting Thu 10/20/2015, Normal    potassium chloride (KLOR-CON) 8 MEQ tablet TAKE 1 TABLET(8 MEQ) BY MOUTH DAILY, Normal    ramipril (ALTACE) 10 MG capsule Take 10 mg by mouth 2 (two) times daily., Historical Med    risperiDONE (RISPERDAL) 0.5 MG tablet Take 1 tablet (0.5 mg total) by mouth 3 (three) times daily., Starting Thu 01/31/2017, Normal    torsemide (DEMADEX) 10 MG tablet TAKE 1 TABLET BY MOUTH TWICE DAILY, Normal    traZODone (DESYREL) 50 MG tablet Take 1 tablet (50 mg total) by mouth as needed., Starting Thu 01/31/2017, Normal    vitamin B-12 (CYANOCOBALAMIN) 1000 MCG tablet Take 1,000 mcg by mouth daily.  , Historical Med       No Known Allergies Follow-up Information    Cari Caraway, MD. Schedule an appointment as soon as possible for a visit in 1 week(s).   Specialty:  Family Medicine Contact information: [place] [address] [phone]        INR check Follow up in 4 day(s).            The results of significant diagnostics from this hospitalization (including imaging, microbiology, ancillary and laboratory) are listed below for reference.    Significant Diagnostic Studies: Dg Chest 2 View  Result Date: 03/08/2017 CLINICAL DATA:  Weakness EXAM: CHEST  2 VIEW  COMPARISON:  09/09/2016 FINDINGS: There is no focal parenchymal opacity. There is no pleural effusion or pneumothorax. There is stable cardiomegaly. There is evidence of prior CABG. The osseous structures are unremarkable. IMPRESSION: No active cardiopulmonary disease. Electronically Signed   By: Kathreen Devoid   On: 03/08/2017 11:26    Microbiology: Recent Results (from the past 240 hour(s))  C difficile quick scan w PCR reflex     Status: None   Collection Time: 03/08/17 10:55 AM  Result Value Ref Range Status   C Diff antigen NEGATIVE NEGATIVE Final   C Diff toxin NEGATIVE NEGATIVE Final   C Diff interpretation No C. difficile detected.  Final  Urine culture     Status: Abnormal   Collection Time: 03/08/17  2:08 PM  Result Value Ref Range Status   Specimen Description URINE, CLEAN CATCH  Final   Special Requests NONE  Final   Culture MULTIPLE  SPECIES PRESENT, SUGGEST RECOLLECTION (A)  Final   Report Status 03/09/2017 FINAL  Final     Labs: Basic Metabolic Panel:  Recent Labs Lab 03/08/17 1054 03/09/17 0452 03/10/17 0456  NA 138 138 139  K 3.6 3.5 3.5  CL 101 105 106  CO2 28 28 28   GLUCOSE 136* 115* 118*  BUN 19 17 14   CREATININE 1.06 1.00 0.93  CALCIUM 9.5 9.1 9.2   Liver Function Tests:  Recent Labs Lab 03/08/17 1054  AST 16  ALT 14*  ALKPHOS 88  BILITOT 1.3*  PROT 6.7  ALBUMIN 3.7    Recent Labs Lab 03/08/17 1054  LIPASE 18   No results for input(s): AMMONIA in the last 168 hours. CBC:  Recent Labs Lab 03/08/17 1054 03/09/17 0452 03/10/17 0456  WBC 9.0 7.8 8.1  NEUTROABS 6.8  --   --   HGB 12.3* 11.5* 11.2*  HCT 35.4* 33.2* 32.4*  MCV 83.9 83.4 85.3  PLT 184 171 190   Cardiac Enzymes: No results for input(s): CKTOTAL, CKMB, CKMBINDEX, TROPONINI in the last 168 hours. BNP: BNP (last 3 results) No results for input(s): BNP in the last 8760 hours.  ProBNP (last 3 results) No results for input(s): PROBNP in the last 8760 hours.  CBG: No  results for input(s): GLUCAP in the last 168 hours.     SignedDomenic Polite MD.  [facility] 03/10/2017, 1:17 PM

## 2017-03-10 NOTE — Progress Notes (Signed)
[name] for Warfarin Indication: AVR, hx Afib on chronic Warfarin  No Known Allergies  Patient Measurements: Height: 6\' 4"  (193 cm) Weight: 230 lb 13.2 oz (104.7 kg) IBW/kg (Calculated) : 86.8  Vital Signs: Temp: 98.2 F (36.8 C) (07/15 0830) Temp Source: Oral (07/15 0830) BP: 205/91 (07/15 0830) Pulse Rate: 87 (07/15 0830)  Labs:  Recent Labs  03/08/17 1054 03/08/17 1731 03/09/17 0452 03/10/17 0456  HGB 12.3*  --  11.5* 11.2*  HCT 35.4*  --  33.2* 32.4*  PLT 184  --  171 190  LABPROT 22.0* 22.8* 23.6* 22.0*  INR 1.89 1.98 2.06 1.90  CREATININE 1.06  --  1.00 0.93   Estimated Creatinine Clearance: 82.8 mL/min (by C-G formula based on SCr of 0.93 mg/dL).  Medical History: Past Medical History:  Diagnosis Date  . Aortic valve replaced   . Arthritis   . Atrial fibrillation ([place])    longterm persistent  . Back pain, chronic    SINCE BACK INJURY / FRACTURE  . BPH (benign prostatic hyperplasia)    grapey  . CAD (coronary artery disease)   . Cancer (HCC)    RIGHT RENAL  . Constipation   . Cough 06/16/12   C/O OF COUGH / COLD FOR A COUPLE OF WEEKS  . Dementia   . Diverticulitis    history  . Elevated fasting glucose 2012  . GERD (gastroesophageal reflux disease)   . HTN (hypertension)   . Hyperlipidemia   . Hypokalemia   . Kidney disorder    ablation defect in the right kidney lower pole but progression of interpolar lesion: stable on repeat CT (06/2014)  . Right renal mass    renal cell carcinoma  . Shortness of breath    PT STATES RELATED TO HIS AF AND HEART BEING OUT OF RHYTHM  . Stomach ulcer    Medications:  Scheduled:  . atorvastatin  40 mg Oral Daily  . carvedilol  12.5 mg Oral BID WC  . cholecalciferol  1,000 Units Oral Daily  . escitalopram  10 mg Oral Daily  . pantoprazole (PROTONIX) IV  40 mg Intravenous Q12H  . ramipril  10 mg Oral BID  . risperiDONE  0.5 mg Oral TID  . sodium chloride flush  3 mL  Intravenous Q12H  . torsemide  20 mg Oral Daily  . vitamin B-12  1,000 mcg Oral Daily  . Warfarin - Pharmacist Dosing Inpatient   Does not apply q1800   Assessment: 83 yoM to ED 7/13 for increasing weakness, decreasing po intake. Diarrhea noted, possible dark blood noted, no BRB in stool.   PMH: AVR, hx  Afib on chronic Warf - 8mg  qd, LD 7/12, goal INR 2-3 per Coumadin clinic note HTN, HPL, BPH, dementia with caregiver at home.   7/13 No dose per MD request   7/14 per notes, pt had mild GI bleed but no evidence of active bleed. Hemoccult positive but Hgb in pt baseline range   Today 03/10/17  INR is 1.90 subtherapeutic, Pt 22.0   Hgb 11.2, stable, plt 190   Pt did not receive dose on 7/13 as per MD request   Today, MD requests warfarin to be restarted    Goal of Therapy:  INR 2-3 Monitor platelets by anticoagulation protocol: Yes   Plan:   Warfarin 8 mg PO x 1 as per home med regimen   Daily PT/INR ordered  Monitor for signs and symptoms of bleeding  Royetta Asal, PharmD, BCPS Pager [phone] 03/10/2017 8:52 AM

## 2017-03-10 NOTE — Progress Notes (Signed)
D/c to home w/ son via w/c voices no c/o all d/c instructions given w/ verbal understanding.

## 2017-03-11 ENCOUNTER — Observation Stay (HOSPITAL_BASED_OUTPATIENT_CLINIC_OR_DEPARTMENT_OTHER): Payer: PPO

## 2017-03-11 ENCOUNTER — Emergency Department (HOSPITAL_COMMUNITY): Payer: PPO

## 2017-03-11 ENCOUNTER — Encounter (HOSPITAL_COMMUNITY): Payer: Self-pay

## 2017-03-11 ENCOUNTER — Inpatient Hospital Stay (HOSPITAL_COMMUNITY)
Admission: EM | Admit: 2017-03-11 | Discharge: 2017-03-12 | DRG: 315 | Disposition: A | Discharge status: Home Health | Payer: PPO | Attending: Family Medicine | Admitting: Family Medicine

## 2017-03-11 DIAGNOSIS — I252 Old myocardial infarction: Secondary | ICD-10-CM | POA: Diagnosis not present

## 2017-03-11 DIAGNOSIS — I2510 Atherosclerotic heart disease of native coronary artery without angina pectoris: Secondary | ICD-10-CM | POA: Diagnosis not present

## 2017-03-11 DIAGNOSIS — J189 Pneumonia, unspecified organism: Secondary | ICD-10-CM | POA: Diagnosis not present

## 2017-03-11 DIAGNOSIS — R0902 Hypoxemia: Secondary | ICD-10-CM | POA: Diagnosis not present

## 2017-03-11 DIAGNOSIS — I5032 Chronic diastolic (congestive) heart failure: Secondary | ICD-10-CM | POA: Diagnosis not present

## 2017-03-11 DIAGNOSIS — I4891 Unspecified atrial fibrillation: Secondary | ICD-10-CM | POA: Diagnosis not present

## 2017-03-11 DIAGNOSIS — I110 Hypertensive heart disease with heart failure: Secondary | ICD-10-CM | POA: Diagnosis not present

## 2017-03-11 DIAGNOSIS — E785 Hyperlipidemia, unspecified: Secondary | ICD-10-CM | POA: Diagnosis not present

## 2017-03-11 DIAGNOSIS — N390 Urinary tract infection, site not specified: Secondary | ICD-10-CM | POA: Diagnosis not present

## 2017-03-11 DIAGNOSIS — Z7982 Long term (current) use of aspirin: Secondary | ICD-10-CM | POA: Diagnosis not present

## 2017-03-11 DIAGNOSIS — I9589 Other hypotension: Principal | ICD-10-CM | POA: Diagnosis present

## 2017-03-11 DIAGNOSIS — W1830XA Fall on same level, unspecified, initial encounter: Secondary | ICD-10-CM | POA: Diagnosis present

## 2017-03-11 DIAGNOSIS — F0391 Unspecified dementia with behavioral disturbance: Secondary | ICD-10-CM | POA: Diagnosis present

## 2017-03-11 DIAGNOSIS — Z79899 Other long term (current) drug therapy: Secondary | ICD-10-CM

## 2017-03-11 DIAGNOSIS — Z8249 Family history of ischemic heart disease and other diseases of the circulatory system: Secondary | ICD-10-CM

## 2017-03-11 DIAGNOSIS — Z7901 Long term (current) use of anticoagulants: Secondary | ICD-10-CM | POA: Diagnosis not present

## 2017-03-11 DIAGNOSIS — K0889 Other specified disorders of teeth and supporting structures: Secondary | ICD-10-CM | POA: Diagnosis present

## 2017-03-11 DIAGNOSIS — G8929 Other chronic pain: Secondary | ICD-10-CM | POA: Diagnosis not present

## 2017-03-11 DIAGNOSIS — N400 Enlarged prostate without lower urinary tract symptoms: Secondary | ICD-10-CM | POA: Diagnosis present

## 2017-03-11 DIAGNOSIS — A419 Sepsis, unspecified organism: Secondary | ICD-10-CM | POA: Diagnosis not present

## 2017-03-11 DIAGNOSIS — I10 Essential (primary) hypertension: Secondary | ICD-10-CM | POA: Diagnosis not present

## 2017-03-11 DIAGNOSIS — Z87891 Personal history of nicotine dependence: Secondary | ICD-10-CM | POA: Diagnosis not present

## 2017-03-11 DIAGNOSIS — E876 Hypokalemia: Secondary | ICD-10-CM | POA: Diagnosis not present

## 2017-03-11 DIAGNOSIS — Z952 Presence of prosthetic heart valve: Secondary | ICD-10-CM

## 2017-03-11 DIAGNOSIS — K219 Gastro-esophageal reflux disease without esophagitis: Secondary | ICD-10-CM | POA: Diagnosis present

## 2017-03-11 DIAGNOSIS — M549 Dorsalgia, unspecified: Secondary | ICD-10-CM | POA: Diagnosis present

## 2017-03-11 DIAGNOSIS — I482 Chronic atrial fibrillation: Secondary | ICD-10-CM | POA: Diagnosis present

## 2017-03-11 DIAGNOSIS — Z85528 Personal history of other malignant neoplasm of kidney: Secondary | ICD-10-CM

## 2017-03-11 DIAGNOSIS — W19XXXA Unspecified fall, initial encounter: Secondary | ICD-10-CM | POA: Diagnosis not present

## 2017-03-11 DIAGNOSIS — I360 Nonrheumatic tricuspid (valve) stenosis: Secondary | ICD-10-CM | POA: Diagnosis not present

## 2017-03-11 DIAGNOSIS — J9811 Atelectasis: Secondary | ICD-10-CM | POA: Diagnosis not present

## 2017-03-11 DIAGNOSIS — Z9049 Acquired absence of other specified parts of digestive tract: Secondary | ICD-10-CM

## 2017-03-11 DIAGNOSIS — Z951 Presence of aortocoronary bypass graft: Secondary | ICD-10-CM

## 2017-03-11 LAB — CBC WITH DIFFERENTIAL/PLATELET
BASOS PCT: 0 %
Basophils Absolute: 0 10*3/uL (ref 0.0–0.1)
EOS ABS: 0.5 10*3/uL (ref 0.0–0.7)
Eosinophils Relative: 5 %
HEMATOCRIT: 34.4 % — AB (ref 39.0–52.0)
Hemoglobin: 12.1 g/dL — ABNORMAL LOW (ref 13.0–17.0)
LYMPHS ABS: 1.3 10*3/uL (ref 0.7–4.0)
Lymphocytes Relative: 15 %
MCH: 29.4 pg (ref 26.0–34.0)
MCHC: 35.2 g/dL (ref 30.0–36.0)
MCV: 83.7 fL (ref 78.0–100.0)
MONO ABS: 0.7 10*3/uL (ref 0.1–1.0)
MONOS PCT: 8 %
Neutro Abs: 6.4 10*3/uL (ref 1.7–7.7)
Neutrophils Relative %: 72 %
Platelets: 209 10*3/uL (ref 150–400)
RBC: 4.11 MIL/uL — ABNORMAL LOW (ref 4.22–5.81)
RDW: 12.8 % (ref 11.5–15.5)
WBC: 8.9 10*3/uL (ref 4.0–10.5)

## 2017-03-11 LAB — COMPREHENSIVE METABOLIC PANEL
ALBUMIN: 3.4 g/dL — AB (ref 3.5–5.0)
ALK PHOS: 90 U/L (ref 38–126)
ALT: 16 U/L — AB (ref 17–63)
AST: 19 U/L (ref 15–41)
Anion gap: 8 (ref 5–15)
BUN: 14 mg/dL (ref 6–20)
CALCIUM: 9.1 mg/dL (ref 8.9–10.3)
CO2: 28 mmol/L (ref 22–32)
CREATININE: 1.06 mg/dL (ref 0.61–1.24)
Chloride: 101 mmol/L (ref 101–111)
GFR calc Af Amer: >60 mL/min (ref >60)
GFR calc non Af Amer: >60 mL/min (ref >60)
GLUCOSE: 136 mg/dL — AB (ref 65–99)
Potassium: 3.2 mmol/L — ABNORMAL LOW (ref 3.5–5.1)
SODIUM: 137 mmol/L (ref 135–145)
Total Bilirubin: 0.6 mg/dL (ref 0.3–1.2)
Total Protein: 6.4 g/dL — ABNORMAL LOW (ref 6.5–8.1)

## 2017-03-11 LAB — URINALYSIS, ROUTINE W REFLEX MICROSCOPIC
BILIRUBIN URINE: NEGATIVE
Glucose, UA: NEGATIVE mg/dL
HGB URINE DIPSTICK: NEGATIVE
KETONES UR: NEGATIVE mg/dL
Leukocytes, UA: NEGATIVE
Nitrite: NEGATIVE
PH: 5 (ref 5.0–8.0)
Protein, ur: NEGATIVE mg/dL
Specific Gravity, Urine: 1.009 (ref 1.005–1.030)

## 2017-03-11 LAB — PROTIME-INR
INR: 1.96
Prothrombin Time: 22.6 seconds — ABNORMAL HIGH (ref 11.4–15.2)

## 2017-03-11 LAB — ECHOCARDIOGRAM COMPLETE
HEIGHTINCHES: 76 in
WEIGHTICAEL: 3696.67 [oz_av]

## 2017-03-11 LAB — POC OCCULT BLOOD, ED: FECAL OCCULT BLD: NEGATIVE

## 2017-03-11 LAB — I-STAT CG4 LACTIC ACID, ED
LACTIC ACID, VENOUS: 0.72 mmol/L (ref 0.5–1.9)
Lactic Acid, Venous: 1.65 mmol/L (ref 0.5–1.9)

## 2017-03-11 MED ORDER — SODIUM CHLORIDE 0.9 % IV BOLUS (SEPSIS)
1000.0000 mL | Freq: Once | INTRAVENOUS | Status: AC
  Administered 2017-03-11: 1000 mL via INTRAVENOUS

## 2017-03-11 MED ORDER — WARFARIN SODIUM 4 MG PO TABS
8.0000 mg | ORAL_TABLET | Freq: Every day | ORAL | Status: DC
  Administered 2017-03-11 – 2017-03-12 (×2): 8 mg via ORAL
  Filled 2017-03-11 (×2): qty 2

## 2017-03-11 MED ORDER — TRAZODONE HCL 50 MG PO TABS: 50.0000 mg | ORAL_TABLET | Freq: Every evening | ORAL | Status: DC | PRN

## 2017-03-11 MED ORDER — VITAMIN D3 25 MCG (1000 UNIT) PO TABS
1000.0000 [IU] | ORAL_TABLET | Freq: Every day | ORAL | Status: DC
  Administered 2017-03-11 – 2017-03-12 (×2): 1000 [IU] via ORAL
  Filled 2017-03-11 (×2): qty 1

## 2017-03-11 MED ORDER — ORAL CARE MOUTH RINSE
15.0000 mL | Freq: Two times a day (BID) | OROMUCOSAL | Status: DC
  Administered 2017-03-11 – 2017-03-12 (×3): 15 mL via OROMUCOSAL

## 2017-03-11 MED ORDER — ATORVASTATIN CALCIUM 40 MG PO TABS
40.0000 mg | ORAL_TABLET | Freq: Every day | ORAL | Status: DC
  Administered 2017-03-11 – 2017-03-12 (×2): 40 mg via ORAL
  Filled 2017-03-11 (×2): qty 1

## 2017-03-11 MED ORDER — WARFARIN - PHYSICIAN DOSING INPATIENT: Freq: Every day | Status: DC

## 2017-03-11 MED ORDER — NITROGLYCERIN 0.4 MG SL SUBL: 0.4000 mg | SUBLINGUAL_TABLET | SUBLINGUAL | Status: DC | PRN

## 2017-03-11 MED ORDER — VANCOMYCIN HCL IN DEXTROSE 1-5 GM/200ML-% IV SOLN
1000.0000 mg | Freq: Once | INTRAVENOUS | Status: DC
  Administered 2017-03-11: 1000 mg via INTRAVENOUS
  Filled 2017-03-11: qty 200

## 2017-03-11 MED ORDER — VITAMIN B-12 1000 MCG PO TABS
1000.0000 ug | ORAL_TABLET | Freq: Every day | ORAL | Status: DC
  Administered 2017-03-11 – 2017-03-12 (×2): 1000 ug via ORAL
  Filled 2017-03-11 (×2): qty 1

## 2017-03-11 MED ORDER — PIPERACILLIN-TAZOBACTAM 3.375 G IVPB 30 MIN
3.3750 g | Freq: Once | INTRAVENOUS | Status: AC
  Administered 2017-03-11: 3.375 g via INTRAVENOUS
  Filled 2017-03-11: qty 50

## 2017-03-11 MED ORDER — DIGOXIN 125 MCG PO TABS
125.0000 ug | ORAL_TABLET | Freq: Every day | ORAL | Status: DC
  Administered 2017-03-11 – 2017-03-12 (×2): 125 ug via ORAL
  Filled 2017-03-11 (×2): qty 1

## 2017-03-11 MED ORDER — FERROUS SULFATE 325 (65 FE) MG PO TABS
325.0000 mg | ORAL_TABLET | Freq: Every day | ORAL | Status: DC
Start: 2017-03-11 — End: 2017-03-12

## 2017-03-11 MED ORDER — PANTOPRAZOLE SODIUM 40 MG PO TBEC
40.0000 mg | DELAYED_RELEASE_TABLET | Freq: Every day | ORAL | Status: DC
  Administered 2017-03-11 – 2017-03-12 (×2): 40 mg via ORAL
  Filled 2017-03-11 (×2): qty 1

## 2017-03-11 MED ORDER — SODIUM CHLORIDE 0.9 % IV BOLUS (SEPSIS)
1000.0000 mL | Freq: Once | INTRAVENOUS | Status: AC
Start: 2017-03-11 — End: 2017-03-11
  Administered 2017-03-11: 1000 mL via INTRAVENOUS

## 2017-03-11 MED ORDER — CARVEDILOL 12.5 MG PO TABS
12.5000 mg | ORAL_TABLET | Freq: Two times a day (BID) | ORAL | Status: DC
  Administered 2017-03-11 – 2017-03-12 (×3): 12.5 mg via ORAL
  Filled 2017-03-11 (×3): qty 1

## 2017-03-11 MED ORDER — RAMIPRIL 10 MG PO CAPS
10.0000 mg | ORAL_CAPSULE | Freq: Two times a day (BID) | ORAL | Status: DC
  Administered 2017-03-11 – 2017-03-12 (×3): 10 mg via ORAL
  Filled 2017-03-11 (×4): qty 1

## 2017-03-11 MED ORDER — POTASSIUM CHLORIDE ER 10 MEQ PO TBCR
10.0000 meq | EXTENDED_RELEASE_TABLET | Freq: Every day | ORAL | Status: DC
  Administered 2017-03-11 – 2017-03-12 (×2): 10 meq via ORAL
  Filled 2017-03-11 (×4): qty 1

## 2017-03-11 MED ORDER — PIPERACILLIN-TAZOBACTAM 3.375 G IVPB
3.3750 g | Freq: Three times a day (TID) | INTRAVENOUS | Status: DC
  Administered 2017-03-11 – 2017-03-12 (×4): 3.375 g via INTRAVENOUS
  Filled 2017-03-11 (×5): qty 50

## 2017-03-11 MED ORDER — ASPIRIN EC 81 MG PO TBEC
81.0000 mg | DELAYED_RELEASE_TABLET | Freq: Every day | ORAL | Status: DC
  Administered 2017-03-11 – 2017-03-12 (×2): 81 mg via ORAL
  Filled 2017-03-11 (×2): qty 1

## 2017-03-11 MED ORDER — RISPERIDONE 0.5 MG PO TABS
0.5000 mg | ORAL_TABLET | Freq: Three times a day (TID) | ORAL | Status: DC
  Administered 2017-03-11 – 2017-03-12 (×5): 0.5 mg via ORAL
  Filled 2017-03-11 (×5): qty 1

## 2017-03-11 MED ORDER — TORSEMIDE 10 MG PO TABS
10.0000 mg | ORAL_TABLET | Freq: Two times a day (BID) | ORAL | Status: DC
  Administered 2017-03-11 – 2017-03-12 (×4): 10 mg via ORAL
  Filled 2017-03-11 (×4): qty 1

## 2017-03-11 MED ORDER — VANCOMYCIN HCL 10 G IV SOLR
2000.0000 mg | Freq: Once | INTRAVENOUS | Status: AC
  Administered 2017-03-11: 2000 mg via INTRAVENOUS
  Filled 2017-03-11: qty 2000

## 2017-03-11 MED ORDER — SODIUM CHLORIDE 0.9 % IV BOLUS (SEPSIS)
500.0000 mL | Freq: Once | INTRAVENOUS | Status: AC
  Administered 2017-03-11: 500 mL via INTRAVENOUS

## 2017-03-11 MED ORDER — CARVEDILOL 25 MG PO TABS
25.0000 mg | ORAL_TABLET | Freq: Two times a day (BID) | ORAL | Status: DC
  Administered 2017-03-11: 25 mg via ORAL
  Filled 2017-03-11: qty 1

## 2017-03-11 MED ORDER — ESCITALOPRAM OXALATE 10 MG PO TABS
10.0000 mg | ORAL_TABLET | Freq: Every day | ORAL | Status: DC
  Administered 2017-03-11 – 2017-03-12 (×2): 10 mg via ORAL
  Filled 2017-03-11 (×2): qty 1

## 2017-03-11 MED ORDER — AMLODIPINE BESYLATE 5 MG PO TABS
10.0000 mg | ORAL_TABLET | Freq: Every day | ORAL | Status: DC
  Administered 2017-03-11 – 2017-03-12 (×2): 10 mg via ORAL
  Filled 2017-03-11 (×2): qty 2

## 2017-03-11 MED ORDER — VANCOMYCIN HCL 10 G IV SOLR
1250.0000 mg | INTRAVENOUS | Status: DC
  Administered 2017-03-12: 1250 mg via INTRAVENOUS
  Filled 2017-03-11: qty 1250

## 2017-03-11 MED ORDER — CHLORHEXIDINE GLUCONATE 0.12 % MT SOLN
15.0000 mL | Freq: Two times a day (BID) | OROMUCOSAL | Status: DC
  Administered 2017-03-11 – 2017-03-12 (×3): 15 mL via OROMUCOSAL
  Filled 2017-03-11 (×3): qty 15

## 2017-03-11 MED ORDER — SODIUM CHLORIDE 0.9 % IV BOLUS (SEPSIS): 1000.0000 mL | Freq: Once | INTRAVENOUS | Status: DC

## 2017-03-11 NOTE — Progress Notes (Signed)
  Echocardiogram 2D Echocardiogram has been performed.  [name] 03/11/2017, 2:14 PM

## 2017-03-11 NOTE — ED Triage Notes (Signed)
Just recently treated for UTI and tonight got up and slumped down to floor with low blood pressure no pain voiced.

## 2017-03-11 NOTE — ED Provider Notes (Signed)
Central DEPT Provider Note   CSN: [id number] Arrival date & time: 03/11/17  3329  By signing my name below, I, Marcello Moores, attest that this documentation has been prepared under the direction and in the presence of Pollina, Gwenyth Allegra, *. Electronically Signed: Marcello Moores, ED Scribe. 03/11/17. 1:58 AM.  History   Chief Complaint Chief Complaint  Patient presents with  . Hypotension   The history is provided by the patient and a relative. No language interpreter was used.   HPI Comments: GEVIN PEREA is a 83 y.o. male with a h/x of dementia who presents to the Emergency Department complaining of low blood pressure possibly indicated with a sudden fall that occurred yesterday evening. He states that he was trying to stand up to go to the bathroom when he fell to his knees. The pt denies pain, head injury and syncope. The pt states that he has some associated neck pain (possibly due to a dental cavity), back pain, and fever. Per son, the pt fell a few times on 03/11/2017 and family brought him to the ED after two episodes of melena. The son states that after being discharged, the pt has had solid stool since. The pt has a caregiver at home. There is no SOB or chest pain.  Past Medical History:  Diagnosis Date  . Aortic valve replaced   . Arthritis   . Atrial fibrillation ([place])    longterm persistent  . Back pain, chronic    SINCE BACK INJURY / FRACTURE  . BPH (benign prostatic hyperplasia)    grapey  . CAD (coronary artery disease)   . Cancer (HCC)    RIGHT RENAL  . Constipation   . Cough 06/16/12   C/O OF COUGH / COLD FOR A COUPLE OF WEEKS  . Dementia   . Diverticulitis    history  . Elevated fasting glucose 2012  . GERD (gastroesophageal reflux disease)   . HTN (hypertension)   . Hyperlipidemia   . Hypokalemia   . Kidney disorder    ablation defect in the right kidney lower pole but progression of interpolar lesion: stable on repeat CT (06/2014)  .  Right renal mass    renal cell carcinoma  . Shortness of breath    PT STATES RELATED TO HIS AF AND HEART BEING OUT OF RHYTHM  . Stomach ulcer     Patient Active Problem List   Diagnosis Date Noted  . Sepsis due to undetermined organism ([place]) 03/11/2017  . GI bleed 03/08/2017  . Dementia with behavioral disturbance 08/23/2016  . Right renal mass   . Renal carcinoma ([place])   . Spastic bladder 10/05/2014  . Encounter for therapeutic drug monitoring 11/06/2013  . Acute NSTEMI (non-ST elevated myocardial infarction) 09/26/2013  . Chronic calculous cholecystitis 09/08/2013  . Acalculous cholecystitis 09/04/2013  . Abdominal pain, other specified site 09/01/2013  . Constipation 09/01/2013  . Hypotension 09/01/2013  . Leukocytosis 09/01/2013  . Normocytic anemia 09/01/2013  . Hyponatremia 09/01/2013  . Bruit 04/21/2013  . Long term (current) use of anticoagulants 11/22/2010  . Chronic diastolic heart failure ([place]) 11/03/2010  . Aortic valve disorder 10/23/2010  . EDEMA 04/26/2010  . DYSPNEA 04/26/2010  . CHEST PAIN 04/26/2010  . Elevated lipids 01/21/2009  . HYPOKALEMIA 01/21/2009  . Essential hypertension 01/21/2009  . Coronary atherosclerosis 01/21/2009  . Atrial fibrillation ([place]) 01/21/2009  . ALTERED MENTAL STATUS 01/21/2009  . H/O mechanical aortic valve replacement 01/21/2009    Past Surgical History:  Procedure Laterality  Date  . AORTIC VALVE REPLACEMENT  06/16/2002   St Jude [employer] Valve - OK for 1.5T or 3T, Normal SAR, < 3000 gauss/cm spatial gradient - S. [place] RT MRSO  . CHOLECYSTECTOMY N/A 09/08/2013   Procedure: LAPAROSCOPIC CHOLECYSTECTOMY WITH INTRAOPERATIVE CHOLANGIOGRAM;  Surgeon: Odis Hollingshead, MD;  Location: [facility] ORS;  Service: General;  Laterality: N/A;  . CORONARY ARTERY BYPASS GRAFT    . KNEE SURGERY    . LEFT HEART CATHETERIZATION WITH CORONARY/GRAFT ANGIOGRAM N/A 09/07/2013   Procedure: LEFT HEART CATHETERIZATION WITH Beatrix Fetters;   Surgeon: Sinclair Grooms, MD;  Location: [facility] CATH LAB;  Service: Cardiovascular;  Laterality: N/A;  . RADIOFREQUENCY ABLATION KIDNEY     RIGHT (two surgeries)  . Thoracic T12 compression fracture         Home Medications    Prior to Admission medications   Medication Sig Start Date End Date Taking? Authorizing Provider  aspirin EC 81 MG EC tablet Take 1 tablet (81 mg total) by mouth daily. 09/11/13  Yes Short, Noah Delaine, MD  atorvastatin (LIPITOR) 40 MG tablet Take 40 mg by mouth daily. 06/14/15  Yes [provider]  carvedilol (COREG) 25 MG tablet TAKE 1 TABLET BY MOUTH TWICE DAILY WITH MEALS 01/09/17  Yes Josue Hector, MD  cholecalciferol (VITAMIN D) 1000 units tablet Take 1,000 Units by mouth daily.   Yes [provider]  digoxin (DIGOX) 0.125 MG tablet Take 1 tablet (125 mcg total) by mouth daily. 05/16/16  Yes Josue Hector, MD  escitalopram (LEXAPRO) 10 MG tablet Take 10 mg by mouth daily.   Yes [provider]  ferrous sulfate 325 (65 FE) MG EC tablet Take 325 mg by mouth daily with breakfast.   Yes [provider]  nitroGLYCERIN (NITROSTAT) 0.4 MG SL tablet Place 1 tablet (0.4 mg total) under the tongue every 5 (five) minutes as needed. For chest pain 10/20/15  Yes Josue Hector, MD  pantoprazole (PROTONIX) 40 MG tablet Take 1 tablet (40 mg total) by mouth daily. 03/10/17  Yes Domenic Polite, MD  potassium chloride (KLOR-CON) 8 MEQ tablet TAKE 1 TABLET(8 MEQ) BY MOUTH DAILY 02/05/17  Yes Josue Hector, MD  ramipril (ALTACE) 10 MG capsule Take 10 mg by mouth 2 (two) times daily.   Yes [provider]  risperiDONE (RISPERDAL) 0.5 MG tablet Take 1 tablet (0.5 mg total) by mouth 3 (three) times daily. 01/31/17  Yes Star Age, MD  torsemide (DEMADEX) 10 MG tablet TAKE 1 TABLET BY MOUTH TWICE DAILY 02/22/17  Yes Josue Hector, MD  traZODone (DESYREL) 50 MG tablet Take 1 tablet (50 mg total) by mouth as needed. Patient taking  differently: Take 50 mg by mouth at bedtime as needed for sleep.  01/31/17  Yes Star Age, MD  vitamin B-12 (CYANOCOBALAMIN) 1000 MCG tablet Take 1,000 mcg by mouth daily.     Yes [provider]  warfarin (COUMADIN) 4 MG tablet Take 2 tablets (8 mg total) by mouth at bedtime. Continue home dose of 8mg , Needs INR check in 4-5days 03/10/17  Yes Domenic Polite, MD  amLODipine (NORVASC) 10 MG tablet Take 1 tablet (10 mg total) by mouth daily. 03/10/17 03/10/18  Domenic Polite, MD    Family History Family History  Problem Relation Age of Onset  . Hypertension Mother   . Diabetes Mother   . Hypertension Sister   . Diabetes Sister   . Hypertension Brother   . Diabetes Brother  Social History Social History  Substance Use Topics  . Smoking status: Former Smoker    Packs/day: 2.00    Years: 20.00    Types: Cigarettes    Quit date: 08/27/1977  . Smokeless tobacco: Never Used  . Alcohol use No     Allergies   Patient has no known allergies.   Review of Systems Review of Systems  HENT: Positive for dental problem.   Respiratory: Negative for shortness of breath.   Cardiovascular: Negative for chest pain.  Musculoskeletal: Positive for back pain and neck pain.  Neurological: Negative for syncope.     Physical Exam Updated Vital Signs BP (!) 157/92   Pulse 80   Temp 98.7 F (37.1 C) (Oral)   Resp 19   Ht 6' (1.829 m)   Wt 104.3 kg (230 lb)   SpO2 97%   BMI 31.19 kg/m   Physical Exam  Constitutional: He appears well-developed and well-nourished. No distress.  HENT:  Head: Normocephalic and atraumatic.  Right Ear: Hearing normal.  Left Ear: Hearing normal.  Nose: Nose normal.  Mouth/Throat: Oropharynx is clear and moist and mucous membranes are normal.  Eyes: Pupils are equal, round, and reactive to light. Conjunctivae and EOM are normal.  Neck: Normal range of motion. Neck supple.  Cardiovascular: S1 normal and S2 normal.  An irregularly irregular rhythm  present. Exam reveals no gallop and no friction rub.   No murmur heard. Pulmonary/Chest: Effort normal and breath sounds normal. No respiratory distress. He exhibits no tenderness.  Abdominal: Soft. Normal appearance and bowel sounds are normal. There is no hepatosplenomegaly. There is no tenderness. There is no rebound, no guarding, no tenderness at McBurney's point and negative Murphy's sign. No hernia.  Musculoskeletal: Normal range of motion.  Neurological: He is alert. He has normal strength. No cranial nerve deficit or sensory deficit. Coordination normal. GCS eye subscore is 4. GCS verbal subscore is 5. GCS motor subscore is 6.  Skin: Skin is warm, dry and intact. No rash noted. No cyanosis.  Psychiatric: He has a normal mood and affect. His speech is normal and behavior is normal. Thought content normal.  Nursing note and vitals reviewed.    ED Treatments / Results   DIAGNOSTIC STUDIES: Oxygen Saturation is 95% on RA, normal by my interpretation.   COORDINATION OF CARE: 1:46 AM-Discussed next steps with pt. Pt verbalized understanding and is agreeable with the plan.   Labs (all labs ordered are listed, but only abnormal results are displayed) Labs Reviewed  COMPREHENSIVE METABOLIC PANEL - Abnormal; Notable for the following:       Result Value   Potassium 3.2 (*)    Glucose, Bld 136 (*)    Total Protein 6.4 (*)    Albumin 3.4 (*)    ALT 16 (*)    All other components within normal limits  CBC WITH DIFFERENTIAL/PLATELET - Abnormal; Notable for the following:    RBC 4.11 (*)    Hemoglobin 12.1 (*)    HCT 34.4 (*)    All other components within normal limits  CULTURE, BLOOD (ROUTINE X 2)  CULTURE, BLOOD (ROUTINE X 2)  URINE CULTURE  URINALYSIS, ROUTINE W REFLEX MICROSCOPIC  OCCULT BLOOD X 1 CARD TO LAB, STOOL  PROTIME-INR  I-STAT CG4 LACTIC ACID, ED  POC OCCULT BLOOD, ED  I-STAT CG4 LACTIC ACID, ED    EKG  EKG Interpretation  Date/Time:  Monday March 11 2017  01:45:56 EDT Ventricular Rate:  76 PR Interval:  QRS Duration: 105 QT Interval:  366 QTC Calculation: 412 R Axis:   58 Text Interpretation:  Atrial fibrillation RSR' in V1 or V2, probably normal variant Repol abnrm suggests ischemia, anterolateral No significant change since last tracing Confirmed by Orpah Greek [phone]) on 03/11/2017 2:50:20 AM Also confirmed by Orpah Greek [phone]), editor Hattie Perch [phone])  on 03/11/2017 6:55:50 AM       Radiology Dg Chest 2 View  Result Date: 03/11/2017 CLINICAL DATA:  Fever, weakness.  Previous pneumonia. EXAM: CHEST  2 VIEW COMPARISON:  Chest radiograph March 08, 2017 FINDINGS: Cardiac silhouette is mildly enlarged and unchanged. Status post median sternotomy for CABG with multiple fractured sternotomy wires. Bibasilar strandy densities without pleural effusion. No pneumothorax. Soft tissue planes included osseous structures are unchanged. IMPRESSION: Bibasilar atelectasis. Stable cardiomegaly. Electronically Signed   By: Elon Alas M.D.   On: 03/11/2017 04:12    Procedures Procedures (including critical care time)  Medications Ordered in ED Medications  piperacillin-tazobactam (ZOSYN) IVPB 3.375 g (not administered)  vancomycin (VANCOCIN) 1,250 mg in sodium chloride 0.9 % 250 mL IVPB (not administered)  sodium chloride 0.9 % bolus 1,000 mL (not administered)    Followed by  sodium chloride 0.9 % bolus 1,000 mL (1,000 mLs Intravenous New Bag/Given 03/11/17 3428)    Followed by  sodium chloride 0.9 % bolus 500 mL (not administered)  sodium chloride 0.9 % bolus 1,000 mL (0 mLs Intravenous Stopped 03/11/17 0624)  piperacillin-tazobactam (ZOSYN) IVPB 3.375 g (0 g Intravenous Stopped 03/11/17 0313)  vancomycin (VANCOCIN) 2,000 mg in sodium chloride 0.9 % 500 mL IVPB (0 mg Intravenous Stopped 03/11/17 0624)     Initial Impression / Assessment and Plan / ED Course  I have reviewed the triage vital signs and the  nursing notes.  Pertinent labs & imaging results that were available during my care of the patient were reviewed by me and considered in my medical decision making (see chart for details).     Patient presents to the ER after a fall. Patient was recently hospitalized for generalized weakness, diarrhea and rectal bleeding. He was hospitalized on July 13 and discharged earlier today. He apparently got up to go to the bathroom tonight, became weak, dizzy and fell. He reports that he fell to his knees, did not hit his head. He was unable to get back up. EMS was called to the home.. Was found to be hypotensive with a blood pressure of 80 systolic upon EMS arrival. He was administered a small fluid bolus and transported. During transport blood pressure improved.  At arrival to the emergency department, patient was noted to be febrile. Source is unclear. He did not have any infectious symptoms. He did have diarrhea during his previous hospitalization, but apparently diarrhea resolved and he has not had diarrhea today.  Patient's chest x-ray was clear. Urinalysis was normal. White blood cell count was not elevated. Source of fever unknown at this time, urine and blood cultures pending. He was started on sepsis pathway at arrival because of prehospital hypotension and fever. He was administered Zosyn and vancomycin. Patient was given a liter fluid bolus here in the ER. Joint fluid bolus administration of became hypertensive. This was not treated because of his previous hypotension. Now that the food bolus has stopped, however, patient is becoming hypotensive again. Unclear if this is secondary to fever and sepsis or his already known labile blood pressures and recent addition of amlodipine to his blood pressure regimen. He will require  hospitalization for further management.  Final Clinical Impressions(s) / ED Diagnoses   Final diagnoses:  Sepsis, due to unspecified organism [facility])    New Prescriptions New  Prescriptions   No medications on file  I personally performed the services described in this documentation, which was scribed in my presence. The recorded information has been reviewed and is accurate.     Orpah Greek, MD 03/11/17 [phone]

## 2017-03-11 NOTE — ED Notes (Signed)
No respiratory or acute distress noted resting in bed with eyes closed call light in reach family at bedside no reaction to medication noted. 

## 2017-03-11 NOTE — Progress Notes (Signed)
Pharmacy Antibiotic Note  [name] is a 83 y.o. male discharged 7/15 from [facility] now found on floor with hypotension admitted on 03/11/2017 with sepsis.  Pharmacy has been consulted for zosyn and vancomycin dosing.  Plan: Zosyn 3.375g IV q8h (4 hour infusion).  Vancomycin 2 Gm x1 then 1250 mg IV q24h VT=15-20 mg/L Daily Scr  Height: 6' (182.9 cm) Weight: 230 lb (104.3 kg) IBW/kg (Calculated) : 77.6  Temp (24hrs), Avg:99.1 F (37.3 C), Min:98.2 F (36.8 C), Max:100.3 F (37.9 C)   Recent Labs Lab 03/08/17 1054 03/08/17 1224 03/09/17 0452 03/10/17 0456 03/11/17 0210  WBC 9.0  --  7.8 8.1  --   CREATININE 1.06  --  1.00 0.93  --   LATICACIDVEN  --  0.98  --   --  1.65    Estimated Creatinine Clearance: 77.8 mL/min (by C-G formula based on SCr of 0.93 mg/dL).    No Known Allergies  Antimicrobials this admission: 7/16 zosyn >>  7/16 vancomycin >>   Dose adjustments this admission:   Microbiology results:  BCx:   UCx:    Sputum:    MRSA PCR:   Thank you for allowing pharmacy to be a part of this patient's care.  Dorrene German 03/11/2017 2:14 AM

## 2017-03-11 NOTE — ED Notes (Signed)
Bed: OA55 Expected date:  Expected time:  Means of arrival:  Comments: Hypotensive

## 2017-03-11 NOTE — ED Notes (Signed)
Second set of Blood cultures obtained from left upper arm 5ML each bottle.

## 2017-03-11 NOTE — Evaluation (Signed)
Physical Therapy Evaluation Patient Details Name: [name] MRN: [id number] DOB: 1933-12-15 Today's Date: 03/11/2017   History of Present Illness  Pt admitted with  hypotension and fall at home (pt was D/C'd home from [facility] on 7/15)  PMhx: T12 comp fx CAD, aortic valve replacement, CAB, Dementia and chronic back pain  Clinical Impression  Pt admitted with above diagnosis. Pt currently with functional limitations due to the deficits listed below (see PT Problem List). Pt will benefit from skilled PT to increase their independence and safety with mobility to allow discharge to the venue listed below.   Pt with fall at home and LOB x 2 during amb 30' with RW and PT today; pt will benefit from SNF post acute, family and pt agreeable     Follow Up Recommendations Supervision/Assistance - 24 hour;SNF    Equipment Recommendations  None recommended by PT    Recommendations for Other Services       Precautions / Restrictions Precautions Precautions: Fall      Mobility  Bed Mobility Overal bed mobility: Needs Assistance Bed Mobility: Supine to Sit;Sit to Supine     Supine to sit: Min guard Sit to supine: Min assist   General bed mobility comments: discouraged son from assisting, pt used rail and did partial turn to sit up--min/guard for trunk adn incr time, assist with LEs on to bed  Transfers Overall transfer level: Needs assistance Equipment used: Rolling walker (2 wheeled);1 person hand held assist Transfers: Stand Pivot Transfers Sit to Stand: Min assist Stand pivot transfers: Min assist       General transfer comment: min assist  anterior- superior wt shift  and to maintain balance in standing  Ambulation/Gait Ambulation/Gait assistance: Min assist Ambulation Distance (Feet): 30 Feet Assistive device: Rolling walker (2 wheeled) Gait Pattern/deviations: Step-to pattern;Decreased step length - right;Decreased step length - left;Trunk flexed Gait velocity: decr    General Gait Details: cues for posture and distance from RW, LOB x2 needing min assist to recover, delayed reaction time  Stairs            Wheelchair Mobility    Modified Rankin (Stroke Patients Only)       Balance Overall balance assessment: Needs assistance Sitting-balance support: No upper extremity supported;Feet supported Sitting balance-Leahy Scale: Good     Standing balance support: Bilateral upper extremity supported Standing balance-Leahy Scale: Poor Standing balance comment: reliant on support of PT and RW; after amb briefly able perform stand pivot with incr time and unilateral UE support                             Pertinent Vitals/Pain Pain Assessment: No/denies pain    Home Living Family/patient expects to be discharged to:: Skilled nursing facility Living Arrangements: Spouse/significant other Available Help at Discharge: Family;Personal care attendant;Other (Comment) Type of Home: House Home Access: Ramped entrance     Home Layout: One level Home Equipment: Walker - 2 wheels      Prior Function Level of Independence: Needs assistance   Gait / Transfers Assistance Needed: amb with RW           Hand Dominance        Extremity/Trunk Assessment   Upper Extremity Assessment Upper Extremity Assessment: Overall WFL for tasks assessed (UEs fatigue easily)    Lower Extremity Assessment Lower Extremity Assessment: Generalized weakness       Communication   Communication: No difficulties  Cognition Arousal/Alertness: Awake/alert  Behavior During Therapy: WFL for tasks assessed/performed Overall Cognitive Status: History of cognitive impairments - at baseline                                        General Comments      Exercises     Assessment/Plan    PT Assessment Patient needs continued PT services  PT Problem List Decreased strength;Decreased activity tolerance;Decreased balance;Decreased  mobility;Decreased cognition;Decreased knowledge of use of DME       PT Treatment Interventions DME instruction;Gait training;Functional mobility training;Therapeutic activities;Therapeutic exercise;Balance training;Cognitive remediation;Patient/family education    PT Goals (Current goals can be found in the Care Plan section)  Acute Rehab PT Goals Patient Stated Goal: get stronger adn more IND PT Goal Formulation: With patient/family Time For Goal Achievement: 03/23/17 Potential to Achieve Goals: Good    Frequency Min 3X/week   Barriers to discharge Decreased caregiver support previously family/pt unwilling to consider rehab, now feel pt can benefit and pt agreeable    Co-evaluation               AM-PAC PT "6 Clicks" Daily Activity  Outcome Measure Difficulty turning over in bed (including adjusting bedclothes, sheets and blankets)?: Total Difficulty moving from lying on back to sitting on the side of the bed? : Total Difficulty sitting down on and standing up from a chair with arms (e.g., wheelchair, bedside commode, etc,.)?: Total Help needed moving to and from a bed to chair (including a wheelchair)?: A Little Help needed walking in hospital room?: A Little Help needed climbing 3-5 steps with a railing? : A Lot 6 Click Score: 11    End of Session Equipment Utilized During Treatment: Gait belt Activity Tolerance: Patient tolerated treatment well Patient left: with call bell/phone within reach;in bed;with family/visitor present;with bed alarm set Nurse Communication: Mobility status PT Visit Diagnosis: Unsteadiness on feet (R26.81);Difficulty in walking, not elsewhere classified (R26.2)    Time: 1430-1455 PT Time Calculation (min) (ACUTE ONLY): 25 min   Charges:   PT Evaluation $PT Eval Moderate Complexity: 1 Procedure PT Treatments $Gait Training: 8-22 mins   PT G Codes:   PT G-Codes **NOT FOR INPATIENT CLASS** Functional Assessment Tool Used: AM-PAC 6 Clicks  Basic Mobility;Clinical judgement Functional Limitation: Mobility: Walking and moving around Mobility: Walking and Moving Around Current Status (I2979): At least 40 percent but less than 60 percent impaired, limited or restricted Mobility: Walking and Moving Around Goal Status [phone]): At least 20 percent but less than 40 percent impaired, limited or restricted  Kenyon Ana, PT Pager: [phone] 03/11/2017 }  Kenyon Ana 03/11/2017, 3:26 PM

## 2017-03-11 NOTE — H&P (Signed)
History and Physical  [name] KGM:[id number] DOB: 1934-07-10 DOA: 03/11/2017  PCP:  Cari Caraway, MD   Chief Complaint:  Fall  History of Present Illness:  Pt is a 83 yo male with hx of CAD s/p CABG, dementia, HTN who was admitted recently for fatigue/weakness and discharged yesterday with diagnosis of anemia ( workup denied by family due to age) and with arrangement to get rehab as outpatient. Pt went home and overnight fell while trying to go to the bathroom. He denied LOC. He denied any chest pain or any other complaints although reported mild dizziness. Family were worried due to frequent recent falls and decided to bring him back especially as his BP was in 39s when checked by EMS. In the ED BP improved after IVF. Pt is a poor historian and hx was taken from records and son at bedside.   Review of Systems:  CONSTITUTIONAL:     No night sweats.  No fatigue.  + fever. No chills. Eyes:                            No visual changes.  No eye pain.  No eye discharge.   ENT:                              No epistaxis.  No sinus pain.  No sore throat.   No congestion. RESPIRATORY:           No cough.  No wheeze.  No hemoptysis.  No dyspnea CARDIOVASCULAR   :  No chest pains.  No palpitations. GASTROINTESTINAL:  No abdominal pain.  No nausea. No vomiting.  No diarrhea. No   constipation.  No hematemesis.  No hematochezia.  No melena. GENITOURINARY:      No urgency.  No frequency.  No dysuria.  No hematuria.  No  obstructive symptoms.  No discharge.  No pain.   MUSCULOSKELETAL:  No musculoskeletal pain.  No joint swelling.  No arthritis. NEUROLOGICAL:        No confusion.  No weakness. No headache. No seizure. PSYCHIATRIC:             No depression. No anxiety. No suicidal ideation. SKIN:                             No rashes.  No lesions.  No wounds. ENDOCRINE:                No weight loss.  No polydipsia.  No polyuria.  No polyphagia. HEMATOLOGIC:           No purpura.  No  petechiae.  No bleeding.  ALLERGIC                 : No pruritus.  No angioedema Other:  Past Medical and Surgical History:   Past Medical History:  Diagnosis Date  . Aortic valve replaced   . Arthritis   . Atrial fibrillation ([place])    longterm persistent  . Back pain, chronic    SINCE BACK INJURY / FRACTURE  . BPH (benign prostatic hyperplasia)    grapey  . CAD (coronary artery disease)   . Cancer ([facility])    RIGHT RENAL  . Constipation   . Cough 06/16/12   C/O OF COUGH / COLD FOR A COUPLE OF WEEKS  . Dementia   .  Diverticulitis    history  . Elevated fasting glucose 2012  . GERD (gastroesophageal reflux disease)   . HTN (hypertension)   . Hyperlipidemia   . Hypokalemia   . Kidney disorder    ablation defect in the right kidney lower pole but progression of interpolar lesion: stable on repeat CT (06/2014)  . Right renal mass    renal cell carcinoma  . Shortness of breath    PT STATES RELATED TO HIS AF AND HEART BEING OUT OF RHYTHM  . Stomach ulcer    Past Surgical History:  Procedure Laterality Date  . AORTIC VALVE REPLACEMENT  06/16/2002   [employer] Valve - OK for 1.5T or 3T, Normal SAR, < 3000 gauss/cm spatial gradient - S. Draughon RT MRSO  . CHOLECYSTECTOMY N/A 09/08/2013   Procedure: LAPAROSCOPIC CHOLECYSTECTOMY WITH INTRAOPERATIVE CHOLANGIOGRAM;  Surgeon: Odis Hollingshead, MD;  Location: [facility];  Service: General;  Laterality: N/A;  . CORONARY ARTERY BYPASS GRAFT    . KNEE SURGERY    . LEFT HEART CATHETERIZATION WITH CORONARY/GRAFT ANGIOGRAM N/A 09/07/2013   Procedure: LEFT HEART CATHETERIZATION WITH Beatrix Fetters;  Surgeon: Sinclair Grooms, MD;  Location: [facility] C[facility] LAB;  Service: Cardiovascular;  Laterality: N/A;  . RADIOFREQUENCY ABLATION KIDNEY     RIGHT (two surgeries)  . Thoracic T12 compression fracture      Social History:   reports that he quit smoking about 39 years ago. His smoking use included Cigarettes. He has a 40.00 pack-year  smoking history. He has never used smokeless tobacco. He reports that he does not drink alcohol or use drugs.    No Known Allergies  Family History  Problem Relation Age of Onset  . Hypertension Mother   . Diabetes Mother   . Hypertension Sister   . Diabetes Sister   . Hypertension Brother   . Diabetes Brother       Prior to Admission medications   Medication Sig Start Date End Date Taking? Authorizing Provider  aspirin EC 81 MG EC tablet Take 1 tablet (81 mg total) by mouth daily. 09/11/13  Yes Short, Noah Delaine, MD  atorvastatin (LIPITOR) 40 MG tablet Take 40 mg by mouth daily. 06/14/15  Yes [provider]  carvedilol (COREG) 25 MG tablet TAKE 1 TABLET BY MOUTH TWICE DAILY WITH MEALS 01/09/17  Yes Josue Hector, MD  cholecalciferol (VITAMIN D) 1000 units tablet Take 1,000 Units by mouth daily.   Yes [provider]  digoxin (DIGOX) 0.125 MG tablet Take 1 tablet (125 mcg total) by mouth daily. 05/16/16  Yes Josue Hector, MD  escitalopram (LEXAPRO) 10 MG tablet Take 10 mg by mouth daily.   Yes [provider]  ferrous sulfate 325 (65 FE) MG EC tablet Take 325 mg by mouth daily with breakfast.   Yes [provider]  nitroGLYCERIN (NITROSTAT) 0.4 MG SL tablet Place 1 tablet (0.4 mg total) under the tongue every 5 (five) minutes as needed. For chest pain 10/20/15  Yes Josue Hector, MD  pantoprazole (PROTONIX) 40 MG tablet Take 1 tablet (40 mg total) by mouth daily. 03/10/17  Yes Domenic Polite, MD  potassium chloride (KLOR-CON) 8 MEQ tablet TAKE 1 TABLET(8 MEQ) BY MOUTH DAILY 02/05/17  Yes Josue Hector, MD  ramipril (ALTACE) 10 MG capsule Take 10 mg by mouth 2 (two) times daily.   Yes [provider]  risperiDONE (RISPERDAL) 0.5 MG tablet Take 1 tablet (0.5 mg total) by mouth 3 (three) times daily.  01/31/17  Yes Star Age, MD  torsemide (DEMADEX) 10 MG tablet TAKE 1 TABLET BY MOUTH TWICE DAILY 02/22/17  Yes Josue Hector, MD  traZODone  (DESYREL) 50 MG tablet Take 1 tablet (50 mg total) by mouth as needed. Patient taking differently: Take 50 mg by mouth at bedtime as needed for sleep.  01/31/17  Yes Star Age, MD  vitamin B-12 (CYANOCOBALAMIN) 1000 MCG tablet Take 1,000 mcg by mouth daily.     Yes [provider]  warfarin (COUMADIN) 4 MG tablet Take 2 tablets (8 mg total) by mouth at bedtime. Continue home dose of 8mg , Needs INR check in 4-5days 03/10/17  Yes Domenic Polite, MD  amLODipine (NORVASC) 10 MG tablet Take 1 tablet (10 mg total) by mouth daily. 03/10/17 03/10/18  Domenic Polite, MD    Physical Exam: BP (!) 157/92   Pulse 80   Temp 98.7 F (37.1 C) (Oral)   Resp 19   Ht 6' (1.829 m)   Wt 104.3 kg (230 lb)   SpO2 97%   BMI 31.19 kg/m   GENERAL :   Alert and cooperative, and appears to be in no acute distress. HEAD:           normocephalic. EYES:            PERRL, EOMI.  vision is grossly intact. EARS:           hearing grossly intact. NOSE:           No nasal discharge. THROAT:     Oral cavity and pharynx normal.   NECK:          supple, non-tender. CARDIAC:    Normal S1 and S2. No gallop. Systolic mumuur murmurs.  Vascular:     + peripheral edema.  LUNGS:       Clear to auscultation  ABDOMEN:  Soft, nondistended, nontender.  MSK:           No joint erythema or tenderness. Normal muscular development.  Neuro        :                      CN II-XII intact.                       Strength and sensation symmetric and intact throughout.   SKIN:            No rash. No lesions. PSYCH:       No hallucination.           Labs on Admission:  Reviewed.   Radiological Exams on Admission: Dg Chest 2 View  Result Date: 03/11/2017 CLINICAL DATA:  Fever, weakness.  Previous pneumonia. EXAM: CHEST  2 VIEW COMPARISON:  Chest radiograph March 08, 2017 FINDINGS: Cardiac silhouette is mildly enlarged and unchanged. Status post median sternotomy for CABG with multiple fractured sternotomy wires. Bibasilar  strandy densities without pleural effusion. No pneumothorax. Soft tissue planes included osseous structures are unchanged. IMPRESSION: Bibasilar atelectasis. Stable cardiomegaly. Electronically Signed   By: Elon Alas M.D.   On: 03/11/2017 04:12    EKG:  Independently reviewed. Afib  Assessment/Plan  Fall:  Likely mechanical due to weakness/deconditioning  Reportedly no LOC. Due to hx of AVR, will check Echo.  EKG with Afib. Will keep on Tele and monitor.  Likely need inpatient rehab placement; consult PT and CM.   Hypotension:  Resolved, low suspicion for sepsis with normal LA, no  leukocytosis, no documented fever, neg UA, neg CXR, no skin wounds or findings suggestive in PE.  Will hold abx, f/u on cx and continue BP  Medications as BP went up again.   CAD: s/p CABG. Continue asp, statin and BB.   HTN: cont home medications. Will monitor BP today and adjust medications as needed to avoid hypotension. Pt with labile BP. May need to drop Norvasc to 5 mg or stop it before discharge with reeval as outpatient.   Afib: continue BB and AC with coumadin. Last INR was 2. May need discussion with primary care/cardiology as outpatient if continues to have falls to dc [facility].   Input & Output:  NA Lines & Tubes: NA DVT prophylaxis:  SCDs GI prophylaxis:  PPI Consultants: PT/CM Code Status: Full Family Communication: Son at bedside Disposition Plan: Inpatient rehab,     Gennaro Africa M.D [facility]

## 2017-03-11 NOTE — ED Notes (Signed)
Pt has not had any urine output in his leg bag yet

## 2017-03-11 NOTE — ED Notes (Signed)
Bed Ready @08015  called floor and per Sec. Nurse will call back.

## 2017-03-11 NOTE — ED Notes (Signed)
First set of blood cultures obtained in right hand 72ml each bottle.

## 2017-03-11 NOTE — ED Notes (Signed)
Informed Dr Waverly Ferrari that pts blood pressure 203/100 no new orders given.

## 2017-03-12 DIAGNOSIS — I482 Chronic atrial fibrillation: Secondary | ICD-10-CM | POA: Diagnosis not present

## 2017-03-12 DIAGNOSIS — I9589 Other hypotension: Secondary | ICD-10-CM | POA: Diagnosis not present

## 2017-03-12 DIAGNOSIS — I10 Essential (primary) hypertension: Secondary | ICD-10-CM | POA: Diagnosis not present

## 2017-03-12 DIAGNOSIS — W19XXXD Unspecified fall, subsequent encounter: Secondary | ICD-10-CM | POA: Diagnosis not present

## 2017-03-12 DIAGNOSIS — G20 Parkinson's disease: Secondary | ICD-10-CM

## 2017-03-12 DIAGNOSIS — F0281 Dementia in other diseases classified elsewhere with behavioral disturbance: Secondary | ICD-10-CM | POA: Diagnosis not present

## 2017-03-12 DIAGNOSIS — A419 Sepsis, unspecified organism: Secondary | ICD-10-CM | POA: Diagnosis not present

## 2017-03-12 LAB — URINE CULTURE: Culture: NO GROWTH

## 2017-03-12 LAB — CBC WITH DIFFERENTIAL/PLATELET
Basophils Absolute: 0 10*3/uL (ref 0.0–0.1)
Basophils Relative: 0 %
EOS ABS: 0.4 10*3/uL (ref 0.0–0.7)
Eosinophils Relative: 5 %
HEMATOCRIT: 32.2 % — AB (ref 39.0–52.0)
HEMOGLOBIN: 11.1 g/dL — AB (ref 13.0–17.0)
LYMPHS ABS: 1.6 10*3/uL (ref 0.7–4.0)
LYMPHS PCT: 23 %
MCH: 29.3 pg (ref 26.0–34.0)
MCHC: 34.5 g/dL (ref 30.0–36.0)
MCV: 85 fL (ref 78.0–100.0)
MONOS PCT: 4 %
Monocytes Absolute: 0.3 10*3/uL (ref 0.1–1.0)
NEUTROS PCT: 68 %
Neutro Abs: 4.7 10*3/uL (ref 1.7–7.7)
Platelets: 192 10*3/uL (ref 150–400)
RBC: 3.79 MIL/uL — AB (ref 4.22–5.81)
RDW: 13 % (ref 11.5–15.5)
WBC: 7 10*3/uL (ref 4.0–10.5)

## 2017-03-12 LAB — PROTIME-INR
INR: 1.7
Prothrombin Time: 20.2 seconds — ABNORMAL HIGH (ref 11.4–15.2)

## 2017-03-12 LAB — BASIC METABOLIC PANEL
Anion gap: 9 (ref 5–15)
BUN: 14 mg/dL (ref 6–20)
CHLORIDE: 105 mmol/L (ref 101–111)
CO2: 26 mmol/L (ref 22–32)
CREATININE: 1.15 mg/dL (ref 0.61–1.24)
Calcium: 9 mg/dL (ref 8.9–10.3)
GFR calc Af Amer: >60 mL/min (ref >60)
GFR calc non Af Amer: 58 mL/min — ABNORMAL LOW (ref >60)
GLUCOSE: 186 mg/dL — AB (ref 65–99)
POTASSIUM: 3.3 mmol/L — AB (ref 3.5–5.1)
SODIUM: 140 mmol/L (ref 135–145)

## 2017-03-12 MED ORDER — CARVEDILOL 12.5 MG PO TABS: 12.5000 mg | ORAL_TABLET | Freq: Two times a day (BID) | ORAL | 0 refills | Status: DC

## 2017-03-12 MED ORDER — HYDRALAZINE HCL 20 MG/ML IJ SOLN
5.0000 mg | Freq: Once | INTRAMUSCULAR | Status: AC
  Administered 2017-03-12: 5 mg via INTRAVENOUS
  Filled 2017-03-12: qty 0.25

## 2017-03-12 NOTE — Discharge Summary (Addendum)
Physician Discharge Summary  DESMIN DALEO  IZT:[id number]  DOB: March 17, 1934  DOA: 03/11/2017 PCP: Cari Caraway, MD  Admit date: 03/11/2017 Discharge date: 03/12/2017  Admitted From: Home Disposition:  Home  Recommendations for Outpatient Follow-up:  1. Follow up with PCP in 1-week 2. Home health PT 3. INR check 4. May benefit from ear lavage   Home Health: RN, PT, Aide Equipment/Devices: Rolling walker  Discharge Condition: Stable CODE STATUS: Full code Diet recommendation: Heart Healthy   Brief/Interim Summary: For full details see H&P but in brief: 83 year old male with history of A. fib, mechanical aortic valve on Coumadin, hypertension, hyperlipidemia and BPH presented to the emergency department after sustaining a mechanical fall.  Patient was place in observation and evaluated by physical therapy for possible placement.   Subjective: Patient seen and examined doing well, patient seen at baseline. Only complaining off pain on his right cheek. Patient tolerating diet well is alert and oriented and very interactive. Denies chest pain, shortness of breath, palpitation, dizziness and weakness.  Discharge Diagnoses/Hospital Course:  Mechanical fall Likely due to deconditioning versus vasovagal as blood pressure was found to be low after fall Questionable arrhythmia given history of A. fib, no significant events on telemetry. ECHO was done with no significant changes. PT evaluated patient and recommended short-term rehabilitation although insurance did not approve SNF care. Patient will be discharge home with home health care including PT, RN and health aid.   Hypertension Patient was found hypotensive after mechanical fall, maybe orthostatic, orthostatic vital signs no significant. BP was mildly above goal during hospital stay. Will defer to PCP to adjust medications for now will continue Norvasc 10 mg, Coreg 12.5 twice a day and ramipril 10 mg twice a day. Recommended to check  ambulatory blood pressure periodically.  Chronic A. fib Heart rate well controlled, telemetry monitor shows A. fib. Mechanical aortic valve for which patient is on Coumadin INR goal 2.5-3.5 He discussion with primary care regarding falls although patient high risk for stroke. Follow-up with PCP  Dementia with behavioral issues On risperidone TID, doing well on this medication, will continue. Short-term rehabilitation was declined by insurance so patient will be discharged to home with home health care  Chronic diastolic heart failure Stable, resume torsemide   Of note: patient was treated with antibiotics in the ED for questionable infectious process due to hypotension. But all the investigation remains negative for infectious process. Antibiotics were discontinued. Chest x-ray and UA negative no other sources of infection present at this time. Patient complaining of tooth pain but no abscess or infection noted. Therefore there is no diagnosis of sepsis during this admission.  All other chronic medical condition were stable during the hospitalization.  Patient was seen by physical therapy, recommend short-term rehabilitation On the day of the discharge the patient's vitals were stable, and no other acute medical condition were reported by patient.  Discharge Instructions  You were cared for by a hospitalist during your hospital stay. If you have any questions about your discharge medications or the care you received while you were in the hospital after you are discharged, you can call the unit and asked to speak with the hospitalist on call if the hospitalist that took care of you is not available. Once you are discharged, your primary care physician will handle any further medical issues. Please note that NO REFILLS for any discharge medications will be authorized once you are discharged, as it is imperative that you return to your primary care physician (  or establish a relationship with a  primary care physician if you do not have one) for your aftercare needs so that they can reassess your need for medications and monitor your lab values.  Discharge Instructions    Call MD for:  difficulty breathing, headache or visual disturbances    Complete by:  As directed    Call MD for:  extreme fatigue    Complete by:  As directed    Call MD for:  hives    Complete by:  As directed    Call MD for:  persistant dizziness or light-headedness    Complete by:  As directed    Call MD for:  persistant nausea and vomiting    Complete by:  As directed    Call MD for:  redness, tenderness, or signs of infection (pain, swelling, redness, odor or green/yellow discharge around incision site)    Complete by:  As directed    Call MD for:  severe uncontrolled pain    Complete by:  As directed    Call MD for:  temperature >100.4    Complete by:  As directed    Diet - low sodium heart healthy    Complete by:  As directed    Increase activity slowly    Complete by:  As directed      Allergies as of 03/12/2017   No Known Allergies     Medication List    TAKE these medications   amLODipine 10 MG tablet Commonly known as:  NORVASC Take 1 tablet (10 mg total) by mouth daily.   aspirin 81 MG EC tablet Take 1 tablet (81 mg total) by mouth daily.   atorvastatin 40 MG tablet Commonly known as:  LIPITOR Take 40 mg by mouth daily.   carvedilol 12.5 MG tablet Commonly known as:  COREG Take 1 tablet (12.5 mg total) by mouth 2 (two) times daily with a meal. What changed:  medication strength  See the new instructions.   cholecalciferol 1000 units tablet Commonly known as:  VITAMIN D Take 1,000 Units by mouth daily.   digoxin 0.125 MG tablet Commonly known as:  DIGOX Take 1 tablet (125 mcg total) by mouth daily.   escitalopram 10 MG tablet Commonly known as:  LEXAPRO Take 10 mg by mouth daily.   ferrous sulfate 325 (65 FE) MG EC tablet Take 325 mg by mouth daily with  breakfast. Notes to patient:  Per family told to not take this until 03/17/17 from prior admission   nitroGLYCERIN 0.4 MG SL tablet Commonly known as:  NITROSTAT Place 1 tablet (0.4 mg total) under the tongue every 5 (five) minutes as needed. For chest pain   pantoprazole 40 MG tablet Commonly known as:  PROTONIX Take 1 tablet (40 mg total) by mouth daily.   potassium chloride 8 MEQ tablet Commonly known as:  KLOR-CON TAKE 1 TABLET(8 MEQ) BY MOUTH DAILY   ramipril 10 MG capsule Commonly known as:  ALTACE Take 10 mg by mouth 2 (two) times daily.   risperiDONE 0.5 MG tablet Commonly known as:  RISPERDAL Take 1 tablet (0.5 mg total) by mouth 3 (three) times daily.   torsemide 10 MG tablet Commonly known as:  DEMADEX TAKE 1 TABLET BY MOUTH TWICE DAILY   traZODone 50 MG tablet Commonly known as:  DESYREL Take 1 tablet (50 mg total) by mouth as needed. What changed:  when to take this  reasons to take this   vitamin B-12 1000 MCG  tablet Commonly known as:  CYANOCOBALAMIN Take 1,000 mcg by mouth daily.   warfarin 4 MG tablet Commonly known as:  COUMADIN Take 2 tablets (8 mg total) by mouth at bedtime. Continue home dose of 8mg , Needs INR check in [place], [facility] Follow up.   Specialty:  Home Health Services Why:  [facility] nursing,physical/occupational therapy,social worker Contact information: [place] [address] [phone]        Cari Caraway, MD. Schedule an appointment as soon as possible for a visit in 1 week(s).   Specialty:  Family Medicine Why:  Hospital follow up  Contact information: [place] [address] [phone]          No Known Allergies  Consultations:  None    Procedures/Studies: Dg Chest 2 View  Result Date: 03/11/2017 CLINICAL DATA:  Fever, weakness.  Previous pneumonia. EXAM: CHEST  2 VIEW COMPARISON:  Chest radiograph March 08, 2017 FINDINGS:  Cardiac silhouette is mildly enlarged and unchanged. Status post median sternotomy for CABG with multiple fractured sternotomy wires. Bibasilar strandy densities without pleural effusion. No pneumothorax. Soft tissue planes included osseous structures are unchanged. IMPRESSION: Bibasilar atelectasis. Stable cardiomegaly. Electronically Signed   By: Elon Alas M.D.   On: 03/11/2017 04:12   Dg Chest 2 View  Result Date: 03/08/2017 CLINICAL DATA:  Weakness EXAM: CHEST  2 VIEW COMPARISON:  09/09/2016 FINDINGS: There is no focal parenchymal opacity. There is no pleural effusion or pneumothorax. There is stable cardiomegaly. There is evidence of prior CABG. The osseous structures are unremarkable. IMPRESSION: No active cardiopulmonary disease. Electronically Signed   By: Kathreen Devoid   On: 03/08/2017 11:26   ECHO 03/12/17 ------------------------------------------------------------------- Study Conclusions  - Left ventricle: The cavity size was normal. There was severe   concentric hypertrophy. Systolic function was normal. The   estimated ejection fraction was in the range of 60% to 65%. Wall   motion was normal; there were no regional wall motion   abnormalities. The study was not technically sufficient to allow   evaluation of LV diastolic dysfunction due to atrial   fibrillation. Doppler parameters are consistent with high   ventricular filling pressure. - Aortic valve: A mechanical prosthesis was present. The prosthesis   had a normal range of motion. Mean gradient (S): 23 mm Hg. Valve   area (VTI): 1.36 cm^2. Valve area (Vmax): 1.37 cm^2. Valve area   (Vmean): 1.24 cm^2. - Mitral valve: There was trivial regurgitation. - Left atrium: The atrium was mildly dilated. - Pulmonary arteries: PA peak pressure: 37 mm Hg (S).  Impressions:  - No significant change in mean AV gradient since last echo 2017.   The right ventricular systolic pressure was increased consistent   with mild  pulmonary hypertension.   Discharge Exam: Vitals:   03/12/17 1424 03/12/17 1555  BP: (!) 176/72 (!) 145/61  Pulse: 63   Resp: 18   Temp: (!) 97.5 F (36.4 C)    Vitals:   03/12/17 0204 03/12/17 0449 03/12/17 1424 03/12/17 1555  BP: 126/65 (!) 164/83 (!) 176/72 (!) 145/61  Pulse: 65 77 63   Resp: 18 18 18    Temp: 98.1 F (36.7 C) (!) 97.4 F (36.3 C) (!) 97.5 F (36.4 C)   TempSrc: Oral Oral Oral   SpO2: 95% 96% 100%   Weight:      Height:        General: Pt is  alert, awake, not in acute distress HEENT: Impacted cerumen on both ears, unable to visualize tympanic membrane. Poor dentition but no signs of infection. No LAD  Cardiovascular: Irr Irr, R5/J8 +, Crisp metallic sound  no rubs, no gallops Respiratory: CTA bilaterally, no wheezing, no rhonchi Abdominal: Soft, NT, ND, bowel sounds + Extremities: no edema, no cyanosis  The results of significant diagnostics from this hospitalization (including imaging, microbiology, ancillary and laboratory) are listed below for reference.     Microbiology: Recent Results (from the past 240 hour(s))  C difficile quick scan w PCR reflex     Status: None   Collection Time: 03/08/17 10:55 AM  Result Value Ref Range Status   C Diff antigen NEGATIVE NEGATIVE Final   C Diff toxin NEGATIVE NEGATIVE Final   C Diff interpretation No C. difficile detected.  Final  Urine culture     Status: Abnormal   Collection Time: 03/08/17  2:08 PM  Result Value Ref Range Status   Specimen Description URINE, CLEAN CATCH  Final   Special Requests NONE  Final   Culture MULTIPLE SPECIES PRESENT, SUGGEST RECOLLECTION (A)  Final   Report Status 03/09/2017 FINAL  Final  Blood Culture (routine x 2)     Status: None (Preliminary result)   Collection Time: 03/11/17  2:15 AM  Result Value Ref Range Status   Specimen Description BLOOD RIGHT HAND  Final   Special Requests   Final    BOTTLES DRAWN AEROBIC AND ANAEROBIC Blood Culture adequate volume   Culture    Final    NO GROWTH 1 DAY Performed at [facility], [address] [place], [place] [address]    Report Status PENDING  Incomplete  Blood Culture (routine x 2)     Status: None (Preliminary result)   Collection Time: 03/11/17  2:37 AM  Result Value Ref Range Status   Specimen Description BLOOD LEFT ARM  Final   Special Requests   Final    BOTTLES DRAWN AEROBIC AND ANAEROBIC Blood Culture adequate volume   Culture   Final    NO GROWTH 1 DAY Performed at [facility], [place] [address], [place], [place] [address]    Report Status PENDING  Incomplete  Urine Culture     Status: None   Collection Time: 03/11/17  4:44 AM  Result Value Ref Range Status   Specimen Description URINE, RANDOM  Final   Special Requests NONE  Final   Culture   Final    NO GROWTH Performed at [facility], [address], [place], [place] [address]    Report Status 03/12/2017 FINAL  Final     Labs: BNP (last 3 results) No results for input(s): BNP in the last 8760 hours. Basic Metabolic Panel:  Recent Labs Lab 03/08/17 1054 03/09/17 0452 03/10/17 0456 03/11/17 0200 03/12/17 0916  NA 138 138 139 137 140  K 3.6 3.5 3.5 3.2* 3.3*  CL 101 105 106 101 105  CO2 28 28 28 28 26   GLUCOSE 136* 115* 118* 136* 186*  BUN 19 17 14 14 14   CREATININE 1.06 1.00 0.93 1.06 1.15  CALCIUM 9.5 9.1 9.2 9.1 9.0   Liver Function Tests:  Recent Labs Lab 03/08/17 1054 03/11/17 0200  AST 16 19  ALT 14* 16*  ALKPHOS 88 90  BILITOT 1.3* 0.6  PROT 6.7 6.4*  ALBUMIN 3.7 3.4*    Recent Labs Lab 03/08/17 1054  LIPASE 18   No results for  input(s): AMMONIA in the last 168 hours. CBC:  Recent Labs Lab 03/08/17 1054 03/09/17 0452 03/10/17 0456 03/11/17 0200 03/12/17 0916  WBC 9.0 7.8 8.1 8.9 7.0  NEUTROABS 6.8  --   --  6.4 4.7  HGB 12.3* 11.5* 11.2* 12.1* 11.1*  HCT 35.4* 33.2* 32.4* 34.4* 32.2*  MCV 83.9 83.4 85.3 83.7 85.0  PLT 184 171 190 209 192   Cardiac Enzymes: No results  for input(s): CKTOTAL, CKMB, CKMBINDEX, TROPONINI in the last 168 hours. BNP: Invalid input(s): POCBNP CBG: No results for input(s): GLUCAP in the last 168 hours. D-Dimer No results for input(s): DDIMER in the last 72 hours. Hgb A1c No results for input(s): HGBA1C in the last 72 hours. Lipid Profile No results for input(s): CHOL, HDL, LDLCALC, TRIG, CHOLHDL, LDLDIRECT in the last 72 hours. Thyroid function studies No results for input(s): TSH, T4TOTAL, T3FREE, THYROIDAB in the last 72 hours.  Invalid input(s): FREET3 Anemia work up No results for input(s): VITAMINB12, FOLATE, FERRITIN, TIBC, IRON, RETICCTPCT in the last 72 hours. Urinalysis    Component Value Date/Time   COLORURINE YELLOW 03/11/2017 [place] 03/11/2017 0444   LABSPEC 1.009 03/11/2017 0444   PHURINE 5.0 03/11/2017 0444   GLUCOSEU NEGATIVE 03/11/2017 0444   HGBUR NEGATIVE 03/11/2017 0444   BILIRUBINUR NEGATIVE 03/11/2017 0444   KETONESUR NEGATIVE 03/11/2017 0444   PROTEINUR NEGATIVE 03/11/2017 0444   UROBILINOGEN 0.2 09/01/2013 1301   NITRITE NEGATIVE 03/11/2017 0444   LEUKOCYTESUR NEGATIVE 03/11/2017 0444   Sepsis Labs Invalid input(s): PROCALCITONIN,  WBC,  LACTICIDVEN Microbiology Recent Results (from the past 240 hour(s))  C difficile quick scan w PCR reflex     Status: None   Collection Time: 03/08/17 10:55 AM  Result Value Ref Range Status   C Diff antigen NEGATIVE NEGATIVE Final   C Diff toxin NEGATIVE NEGATIVE Final   C Diff interpretation No C. difficile detected.  Final  Urine culture     Status: Abnormal   Collection Time: 03/08/17  2:08 PM  Result Value Ref Range Status   Specimen Description URINE, CLEAN CATCH  Final   Special Requests NONE  Final   Culture MULTIPLE SPECIES PRESENT, SUGGEST RECOLLECTION (A)  Final   Report Status 03/09/2017 FINAL  Final  Blood Culture (routine x 2)     Status: None (Preliminary result)   Collection Time: 03/11/17  2:15 AM  Result Value  Ref Range Status   Specimen Description BLOOD RIGHT HAND  Final   Special Requests   Final    BOTTLES DRAWN AEROBIC AND ANAEROBIC Blood Culture adequate volume   Culture   Final    NO GROWTH 1 DAY Performed at [facility], [address], [place], [place] [address]    Report Status PENDING  Incomplete  Blood Culture (routine x 2)     Status: None (Preliminary result)   Collection Time: 03/11/17  2:37 AM  Result Value Ref Range Status   Specimen Description BLOOD LEFT ARM  Final   Special Requests   Final    BOTTLES DRAWN AEROBIC AND ANAEROBIC Blood Culture adequate volume   Culture   Final    NO GROWTH 1 DAY Performed at [facility], [place] [address] [place], [place] [address]    Report Status PENDING  Incomplete  Urine Culture     Status: None   Collection Time: 03/11/17  4:44 AM  Result Value Ref Range Status   Specimen Description URINE, RANDOM  Final   Special Requests NONE  Final   Culture   Final    NO GROWTH Performed at [facility], [place] [address], [place], [place] [address]    Report Status 03/12/2017 FINAL  Final    Time coordinating discharge: 25 minutes  SIGNED:  Chipper Oman, MD  [facility] 03/12/2017, 5:26 PM  Pager please text page via  [email or web address] Password [email or web address]

## 2017-03-12 NOTE — Care Management Note (Signed)
Case Management Note  Patient Details  Name: [name] MRN: [id number] Date of Birth: 1934-02-22  Subjective/Objective: Received call from nurse that Son wants [facility] per [facility](personal care services) recommendation-contacted rep Sarah-able to accept, & able to start tomorrow.[place] orders placed-MD notified. Also informed [facility] rep Tommi Rumps of cancellation of [place] services. No further CM needs.                   Action/Plan:d/c home w/HHC.   Expected Discharge Date:   (unknown)               Expected Discharge Plan:  [place]  In-House Referral:  Clinical Social Work  Discharge planning Services  CM Consult  Post Acute Care Choice:  Resumption of Svcs/PTA Provider ([facility]-custodial level) Choice offered to:  Adult Children  DME Arranged:    DME Agency:     HH Arranged:  RN, PT, OT, Social Work [employer] Agency:  Other - See comment ([facility])  Status of Service:  Completed, signed off  If discussed at [place] of Stay Meetings, dates discussed:    Additional Comments:  Dessa Phi, RN 03/12/2017, 5:02 PM

## 2017-03-12 NOTE — Progress Notes (Signed)
Contact Son Imraan Wendell, or dtr-Kim Autumn Patty.

## 2017-03-12 NOTE — Care Management Obs Status (Signed)
[facility] NOTIFICATION   Patient Details  Name: TRAYE BATES MRN: [id number] Date of Birth: 1934/01/21   Medicare Observation Status Notification Given:  Yes    MahabirJuliann Pulse, RN 03/12/2017, 2:01 PM

## 2017-03-12 NOTE — Care Management Note (Signed)
Case Management Note  Patient Details  Name: [name] MRN: [id number] Date of Birth: [birth year]  Subjective/Objective:83 y/o m admitted w/Fall. From home, has custodial level care-[facility]. Has son/dtr support. PT recc SNF. CSW already following. Son agree to [facility] if needed-Will check [place] agencies in network.ABN given-administration/MD notified.                    Action/Plan:d/c plan SNF.   Expected Discharge Date:   (unknown)               Expected Discharge Plan:  Skilled Nursing Facility  In-House Referral:  Clinical Social Work  Discharge planning Services  CM Consult  Post Acute Care Choice:  Resumption of Svcs/PTA Provider ([facility] [facility]-custodial level) Choice offered to:  Adult Children  DME Arranged:    DME Agency:     HH Arranged:    HH Agency:     Status of Service:  In process, will continue to follow  If discussed at [place] of Stay Meetings, dates discussed:    Additional Comments:  Dessa Phi, RN 03/12/2017, 1:44 PM

## 2017-03-12 NOTE — Progress Notes (Signed)
CSW spoke with patient's son at bedside, patient asleep. CSW and patient's son discussed PT recommendation for ST rehab at [facility]. Patient's son reported that he was interested in [facility]. CSW will complete FL2 and follow up with [facility].  CSW completed FL2 and followed up with [facility] and agreed to provide update. CSW started insurance authorization.   Insurance authorization denied. CSW notified patient's family. Patient's family decided to discharge home with HHPT. RNCM aware. CSW updated [facility].    Abundio Miu, [place] Social Worker [facility] Cell#: [phone]

## 2017-03-12 NOTE — Progress Notes (Signed)
Per Son request if no d/c to SNF today-agree to home w/HHC-[facility] able to accept-rep [facility] aware & waiting for [facility] orders(HHRN/PT/OT, CSW), & [id number]-until patient can be placed from home.

## 2017-03-12 NOTE — NC FL2 (Signed)
[place] LEVEL OF CARE SCREENING TOOL     IDENTIFICATION  Patient Name: [name] Birthdate: 1934/01/28 Sex: male Admission Date (Current Location): 03/11/2017  [facility] and [place] Number:  Herbalist and Address:  [facility],  [address] [place], [place]      Provider Number: [id number]  Attending Physician Name and Address:  Patrecia Pour, Christean Grief, MD  Relative Name and Phone Number:       Current Level of Care: Hospital Recommended Level of Care: [place] Prior Approval Number:    Date Approved/Denied:   PASRR Number: [id number]  Discharge Plan: SNF    Current Diagnoses: Patient Active Problem List   Diagnosis Date Noted  . Sepsis due to undetermined organism ([place]) 03/11/2017  . Fall 03/11/2017  . GI bleed 03/08/2017  . Dementia with behavioral disturbance 08/23/2016  . Right renal mass   . Renal carcinoma ([place])   . Spastic bladder 10/05/2014  . Encounter for therapeutic drug monitoring 11/06/2013  . Acute NSTEMI (non-ST elevated myocardial infarction) 09/26/2013  . Chronic calculous cholecystitis 09/08/2013  . Acalculous cholecystitis 09/04/2013  . Abdominal pain, other specified site 09/01/2013  . Constipation 09/01/2013  . Hypotension 09/01/2013  . Leukocytosis 09/01/2013  . Normocytic anemia 09/01/2013  . Hyponatremia 09/01/2013  . Bruit 04/21/2013  . Harlene Petralia term (current) use of anticoagulants 11/22/2010  . Chronic diastolic heart failure ([place]) 11/03/2010  . Aortic valve disorder 10/23/2010  . EDEMA 04/26/2010  . DYSPNEA 04/26/2010  . CHEST PAIN 04/26/2010  . Elevated lipids 01/21/2009  . HYPOKALEMIA 01/21/2009  . Essential hypertension 01/21/2009  . Coronary atherosclerosis 01/21/2009  . Atrial fibrillation ([place]) 01/21/2009  . ALTERED MENTAL STATUS 01/21/2009  . H/O mechanical aortic valve replacement 01/21/2009    Orientation RESPIRATION BLADDER Height & Weight     Self  Normal Incontinent, External catheter Weight: 231 lb 0.7 oz (104.8 kg) Height:  6\' 4"  (193 cm)  BEHAVIORAL SYMPTOMS/MOOD NEUROLOGICAL BOWEL NUTRITION STATUS      Continent Diet (Heart)  AMBULATORY STATUS COMMUNICATION OF NEEDS Skin   Limited Assist Verbally Normal                       Personal Care Assistance Level of Assistance  Bathing, Feeding, Dressing Bathing Assistance: Limited assistance Feeding assistance: Independent Dressing Assistance: Limited assistance     Functional Limitations Info             SPECIAL CARE FACTORS FREQUENCY  PT (By licensed PT), OT (By licensed OT)     PT Frequency: 5x/week OT Frequency: 5x/week            Contractures Contractures Info: Not present    Additional Factors Info  Code Status, Allergies Code Status Info: Full Code Allergies Info: NKA           Current Medications (03/12/2017):  This is the current hospital active medication list Current Facility-Administered Medications  Medication Dose Route Frequency Provider Last Rate Last Dose  . amLODipine (NORVASC) tablet 10 mg  10 mg Oral Daily Gennaro Africa, MD   10 mg at 03/12/17 1013  . aspirin EC tablet 81 mg  81 mg Oral Daily Gennaro Africa, MD   81 mg at 03/12/17 1013  . atorvastatin (LIPITOR) tablet 40 mg  40 mg Oral q1800 Gennaro Africa, MD   40 mg at 03/11/17 1715  . carvedilol (COREG) tablet 12.5 mg  12.5 mg Oral BID WC Domenic Polite, MD  12.5 mg at 03/12/17 0757  . chlorhexidine (PERIDEX) 0.12 % solution 15 mL  15 mL Mouth Rinse BID Gennaro Africa, MD   15 mL at 03/12/17 1014  . cholecalciferol (VITAMIN D) tablet 1,000 Units  1,000 Units Oral Daily Gennaro Africa, MD   1,000 Units at 03/12/17 1013  . digoxin (LANOXIN) tablet 125 mcg  125 mcg Oral Daily Gennaro Africa, MD   125 mcg at 03/12/17 1014  . escitalopram (LEXAPRO) tablet 10 mg  10 mg Oral Daily Gennaro Africa, MD   10 mg at 03/12/17 1013  . ferrous sulfate tablet 325 mg  325 mg Oral Q breakfast Gennaro Africa, MD       . MEDLINE mouth rinse  15 mL Mouth Rinse q12n4p Gennaro Africa, MD   15 mL at 03/12/17 1130  . nitroGLYCERIN (NITROSTAT) SL tablet 0.4 mg  0.4 mg Sublingual Q5 min PRN Gennaro Africa, MD      . pantoprazole (PROTONIX) EC tablet 40 mg  40 mg Oral Daily Gennaro Africa, MD   40 mg at 03/12/17 1014  . potassium chloride (K-DUR) CR tablet 10 mEq  10 mEq Oral Daily Gennaro Africa, MD   10 mEq at 03/12/17 1013  . ramipril (ALTACE) capsule 10 mg  10 mg Oral BID Gennaro Africa, MD   10 mg at 03/12/17 1014  . risperiDONE (RISPERDAL) tablet 0.5 mg  0.5 mg Oral TID Gennaro Africa, MD   0.5 mg at 03/12/17 1013  . sodium chloride 0.9 % bolus 1,000 mL  1,000 mL Intravenous Once Pollina, Gwenyth Allegra, MD      . torsemide [facility]) tablet 10 mg  10 mg Oral BID Gennaro Africa, MD   10 mg at 03/12/17 0757  . traZODone (DESYREL) tablet 50 mg  50 mg Oral QHS PRN Gennaro Africa, MD      . vitamin B-12 (CYANOCOBALAMIN) tablet 1,000 mcg  1,000 mcg Oral Daily Gennaro Africa, MD   1,000 mcg at 03/12/17 1013  . warfarin (COUMADIN) tablet 8 mg  8 mg Oral q1800 Gennaro Africa, MD   8 mg at 03/11/17 1712  . Warfarin - Physician Dosing Inpatient   Does not apply [id number] Gennaro Africa, MD         Discharge Medications: Please see discharge summary for a list of discharge medications.  Relevant Imaging Results:  Relevant Lab Results:   Additional Information SSN [id number]  Burnis Medin, LCSW

## 2017-03-12 NOTE — Care Management Note (Signed)
Case Management Note  Patient Details  Name: [name] MRN: [id number] Date of Birth: 1934-01-21  Subjective/Objective: . [name] patient for [facility]HC-family notified-recc HHRN/PT/OT/CSW-will need orders, & f7f prior d/c. Family wants to d/c today if not accepted to SNF today.                 Action/Plan:d/c home w/HHC.   Expected Discharge Date:   (unknown)               Expected Discharge Plan:  [place]  In-House Referral:  Clinical Social Work  Discharge planning Services  CM Consult  Post Acute Care Choice:  Resumption of Svcs/PTA Provider ([facility]-custodial level) Choice offered to:  Adult Children  DME Arranged:    DME Agency:     HH Arranged:    HH Agency:  [facility]  Status of Service:  In process, will continue to follow  If discussed at Long Length of Stay Meetings, dates discussed:    Additional Comments:  Dessa Phi, RN 03/12/2017, 4:00 PM

## 2017-03-13 DIAGNOSIS — I2510 Atherosclerotic heart disease of native coronary artery without angina pectoris: Secondary | ICD-10-CM | POA: Diagnosis not present

## 2017-03-13 DIAGNOSIS — Z951 Presence of aortocoronary bypass graft: Secondary | ICD-10-CM | POA: Diagnosis not present

## 2017-03-13 DIAGNOSIS — I4891 Unspecified atrial fibrillation: Secondary | ICD-10-CM | POA: Diagnosis not present

## 2017-03-13 DIAGNOSIS — Z9181 History of falling: Secondary | ICD-10-CM | POA: Diagnosis not present

## 2017-03-13 DIAGNOSIS — Z720 Tobacco use: Secondary | ICD-10-CM | POA: Diagnosis not present

## 2017-03-13 DIAGNOSIS — M6281 Muscle weakness (generalized): Secondary | ICD-10-CM | POA: Diagnosis not present

## 2017-03-13 DIAGNOSIS — I10 Essential (primary) hypertension: Secondary | ICD-10-CM | POA: Diagnosis not present

## 2017-03-13 DIAGNOSIS — F0391 Unspecified dementia with behavioral disturbance: Secondary | ICD-10-CM | POA: Diagnosis not present

## 2017-03-16 LAB — CULTURE, BLOOD (ROUTINE X 2)
CULTURE: NO GROWTH
Culture: NO GROWTH
SPECIAL REQUESTS: ADEQUATE
Special Requests: ADEQUATE

## 2017-03-27 DIAGNOSIS — I4891 Unspecified atrial fibrillation: Secondary | ICD-10-CM | POA: Diagnosis not present

## 2017-03-27 DIAGNOSIS — Z951 Presence of aortocoronary bypass graft: Secondary | ICD-10-CM | POA: Diagnosis not present

## 2017-03-27 DIAGNOSIS — I2510 Atherosclerotic heart disease of native coronary artery without angina pectoris: Secondary | ICD-10-CM | POA: Diagnosis not present

## 2017-03-27 DIAGNOSIS — I10 Essential (primary) hypertension: Secondary | ICD-10-CM | POA: Diagnosis not present

## 2017-03-27 DIAGNOSIS — F0391 Unspecified dementia with behavioral disturbance: Secondary | ICD-10-CM | POA: Diagnosis not present

## 2017-03-27 DIAGNOSIS — Z9181 History of falling: Secondary | ICD-10-CM | POA: Diagnosis not present

## 2017-03-27 DIAGNOSIS — M6281 Muscle weakness (generalized): Secondary | ICD-10-CM | POA: Diagnosis not present

## 2017-03-27 DIAGNOSIS — Z720 Tobacco use: Secondary | ICD-10-CM | POA: Diagnosis not present

## 2017-04-04 DIAGNOSIS — Z85528 Personal history of other malignant neoplasm of kidney: Secondary | ICD-10-CM | POA: Diagnosis not present

## 2017-04-04 DIAGNOSIS — I10 Essential (primary) hypertension: Secondary | ICD-10-CM | POA: Diagnosis not present

## 2017-04-04 DIAGNOSIS — E785 Hyperlipidemia, unspecified: Secondary | ICD-10-CM | POA: Diagnosis not present

## 2017-04-04 DIAGNOSIS — F411 Generalized anxiety disorder: Secondary | ICD-10-CM | POA: Diagnosis not present

## 2017-04-04 DIAGNOSIS — R296 Repeated falls: Secondary | ICD-10-CM | POA: Diagnosis not present

## 2017-04-04 DIAGNOSIS — F0391 Unspecified dementia with behavioral disturbance: Secondary | ICD-10-CM | POA: Diagnosis not present

## 2017-04-04 DIAGNOSIS — I2510 Atherosclerotic heart disease of native coronary artery without angina pectoris: Secondary | ICD-10-CM | POA: Diagnosis not present

## 2017-04-04 DIAGNOSIS — R7301 Impaired fasting glucose: Secondary | ICD-10-CM | POA: Diagnosis not present

## 2017-04-04 DIAGNOSIS — Z7901 Long term (current) use of anticoagulants: Secondary | ICD-10-CM | POA: Diagnosis not present

## 2017-04-04 DIAGNOSIS — I480 Paroxysmal atrial fibrillation: Secondary | ICD-10-CM | POA: Diagnosis not present

## 2017-04-04 DIAGNOSIS — G4733 Obstructive sleep apnea (adult) (pediatric): Secondary | ICD-10-CM | POA: Diagnosis not present

## 2017-04-18 DIAGNOSIS — Z7901 Long term (current) use of anticoagulants: Secondary | ICD-10-CM | POA: Diagnosis not present

## 2017-04-18 DIAGNOSIS — I480 Paroxysmal atrial fibrillation: Secondary | ICD-10-CM | POA: Diagnosis not present

## 2017-04-22 ENCOUNTER — Telehealth: Payer: Self-pay

## 2017-04-22 NOTE — Telephone Encounter (Signed)
The pts daughter, [name], states that they received a letter stating that the pts insurance will no longer cover Torsemide. I called the pts pharmacy, [facility], and was advised that the prescription went through today for $5.  I then called the pts insurance company, [employer], and s/w [place]. Per Robina Ade coverage will not change but the pt needs to contact [phone] to verify his illegibility with them.  I called Kim Back and gave her the number to call to verify the pts illegibility.  She verbalized understanding and thanked me for my help.

## 2017-05-01 ENCOUNTER — Ambulatory Visit (INDEPENDENT_AMBULATORY_CARE_PROVIDER_SITE_OTHER): Payer: PPO | Admitting: Psychiatry

## 2017-05-01 ENCOUNTER — Encounter (HOSPITAL_COMMUNITY): Payer: Self-pay | Admitting: Psychiatry

## 2017-05-01 ENCOUNTER — Encounter (INDEPENDENT_AMBULATORY_CARE_PROVIDER_SITE_OTHER): Payer: Self-pay

## 2017-05-01 VITALS — BP 108/60 | HR 75 | Ht 76.0 in | Wt 229.0 lb

## 2017-05-01 DIAGNOSIS — Z8679 Personal history of other diseases of the circulatory system: Secondary | ICD-10-CM

## 2017-05-01 DIAGNOSIS — Z87891 Personal history of nicotine dependence: Secondary | ICD-10-CM | POA: Diagnosis not present

## 2017-05-01 DIAGNOSIS — F0391 Unspecified dementia with behavioral disturbance: Secondary | ICD-10-CM | POA: Diagnosis not present

## 2017-05-01 MED ORDER — TEMAZEPAM 15 MG PO CAPS: 15.0000 mg | ORAL_CAPSULE | Freq: Every evening | ORAL | 4 refills | Status: AC | PRN

## 2017-05-01 NOTE — Progress Notes (Signed)
Psychiatric Initial Adult Assessment   Patient Identification: [name] MRN:  [id number] Date of Evaluation:  05/01/2017 Referral Source:  Chief Complaint:   Chief Complaint    Agitation; Memory Loss; Anxiety     Visit Diagnosis: No diagnosis found.  History of Present Illness:   Patient is an 83 year old white father and husband weight months ago that a psychiatric hospital. His first psychiatric hospitalization. These because of being confused and angry. He was uncharacteristically irritable but never quite gotten violent. In the hospital the patient placed on Risperdal 0.5 mg 3 times a day. Overall the patient is doing much better. He is friendly and engageable. He is no problems at home. He lives with his wife who also is memory impaired. The family has a caregiver who helps them out and the family is also care. The patient no longer drives does feels is out of medicines. All these lifestyle. Patient was hospitalized [facility]. There he had a normal CAT scan of his head. The patient denies being depressed. He sleeping only fairly well. He gets up at night a lot walks outside. He is eating well has good energy and has no complaints of concentration. As a good sense of worth is not suicidal now and never has been enjoys watching television. The patient has no history of alcohol. Is no evidence of psychosis is no episodes of major depression or mania. He denies symptoms of generalized anxiety disorder panic disorder or obsessive-compulsive the patient has no past psychiatric history before he was psychiatrically hospitalized. 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 The patient's medical illnesses include coronary bypass surgery and artificial valve disease and takes Coumadin . At this time the patient takes Risperdal but also takes Lexapro 10 mg and trazodone 50 mg.    Associated  Signs/Symptoms: Depression Symptoms:  difficulty concentrating, (Hypo) Manic Symptoms:   Anxiety Symptoms:   Psychotic Symptoms:   PTSD Symptoms:   Past Psychiatric History: None  Previous Psychotropic Medications: Risperdal, Lexapro, trazodone   Substance Abuse History in the last 12 months:    Consequences of Substance Abuse:   Past Medical History:  Past Medical History:  Diagnosis Date  . Aortic valve replaced   . Arthritis   . Atrial fibrillation ([place])    longterm persistent  . Back pain, chronic    SINCE BACK INJURY / FRACTURE  . BPH (benign prostatic hyperplasia)    grapey  . CAD (coronary artery disease)   . Cancer (HCC)    RIGHT RENAL  . Constipation   . Cough 06/16/12   C/O OF COUGH / COLD FOR A COUPLE OF WEEKS  . Dementia   . Diverticulitis    history  . Elevated fasting glucose 2012  . GERD (gastroesophageal reflux disease)   . HTN (hypertension)   . Hyperlipidemia   . Hypokalemia   . Incontinence   . Kidney disorder    ablation defect in the right kidney lower pole but progression of interpolar lesion: stable on repeat CT (06/2014)  . Right renal mass    renal cell carcinoma  . Shortness of breath    PT STATES RELATED TO HIS AF AND HEART BEING OUT OF RHYTHM  . Stomach ulcer     Past Surgical History:  Procedure Laterality Date  . AORTIC VALVE REPLACEMENT  06/16/2002   St Jude Regent Valve - OK for 1.5T or 3T, Normal SAR, <  3000 gauss/cm spatial gradient - S. Draughon RT MRSO  . CHOLECYSTECTOMY N/A 09/08/2013   Procedure: LAPAROSCOPIC CHOLECYSTECTOMY WITH INTRAOPERATIVE CHOLANGIOGRAM;  Surgeon: Odis Hollingshead, MD;  Location: [facility] ORS;  Service: General;  Laterality: N/A;  . CORONARY ARTERY BYPASS GRAFT    . KNEE SURGERY    . LEFT HEART CATHETERIZATION WITH CORONARY/GRAFT ANGIOGRAM N/A 09/07/2013   Procedure: LEFT HEART CATHETERIZATION WITH Beatrix Fetters;  Surgeon: Sinclair Grooms, MD;  Location: [facility] CATH LAB;  Service: Cardiovascular;   Laterality: N/A;  . RADIOFREQUENCY ABLATION KIDNEY     RIGHT (two surgeries)  . Thoracic T12 compression fracture      Family Psychiatric History:   Family History:  Family History  Problem Relation Age of Onset  . Hypertension Mother   . Diabetes Mother   . Hypertension Sister   . Diabetes Sister   . Hypertension Brother   . Diabetes Brother     Social History:   Social History   Social History  . Marital status: Married    Spouse name: N/A  . Number of children: 2  . Years of education: N/A   Occupational History  .      retired   Social History Main Topics  . Smoking status: Former Smoker    Packs/day: 2.00    Years: 20.00    Types: Cigarettes    Quit date: 08/27/1977  . Smokeless tobacco: Never Used  . Alcohol use No  . Drug use: No  . Sexual activity: Not Currently   Other Topics Concern  . None   Social History Narrative   Lives with his wife, good family support, ambulates without assist device.   Caffeine 2 cups daily.     Additional Social History:   Allergies:  No Known Allergies  Metabolic Disorder Labs: Lab Results  Component Value Date   HGBA1C 5.8 (H) 11/07/2015   MPG 120 (H) 11/07/2015   MPG 120 (H) 09/01/2013   No results found for: PROLACTIN No results found for: CHOL, TRIG, HDL, CHOLHDL, VLDL, LDLCALC   Current Medications: Current Outpatient Prescriptions  Medication Sig Dispense Refill  . amLODipine (NORVASC) 10 MG tablet Take 1 tablet (10 mg total) by mouth daily. 30 tablet 0  . aspirin EC 81 MG EC tablet Take 1 tablet (81 mg total) by mouth daily. 30 tablet 0  . atorvastatin (LIPITOR) 40 MG tablet Take 40 mg by mouth daily.  3  . carvedilol (COREG) 12.5 MG tablet Take 1 tablet (12.5 mg total) by mouth 2 (two) times daily with a meal. 60 tablet 0  . cholecalciferol (VITAMIN D) 1000 units tablet Take 1,000 Units by mouth daily.    . digoxin (DIGOX) 0.125 MG tablet Take 1 tablet (125 mcg total) by mouth daily. 30 tablet 11  .  escitalopram (LEXAPRO) 10 MG tablet Take 10 mg by mouth daily.    . nitroGLYCERIN (NITROSTAT) 0.4 MG SL tablet Place 1 tablet (0.4 mg total) under the tongue every 5 (five) minutes as needed. For chest pain 25 tablet 5  . potassium chloride (KLOR-CON) 8 MEQ tablet TAKE 1 TABLET(8 MEQ) BY MOUTH DAILY 30 tablet 9  . ramipril (ALTACE) 10 MG capsule Take 10 mg by mouth 2 (two) times daily.    . risperiDONE (RISPERDAL) 0.5 MG tablet Take 1 tablet (0.5 mg total) by mouth 3 (three) times daily. 270 tablet 1  . torsemide (DEMADEX) 10 MG tablet TAKE 1 TABLET BY MOUTH TWICE DAILY 60 tablet 2  .  traZODone (DESYREL) 50 MG tablet Take 1 tablet (50 mg total) by mouth as needed. (Patient taking differently: Take 50 mg by mouth at bedtime as needed for sleep. ) 90 tablet 1  . vitamin B-12 (CYANOCOBALAMIN) 1000 MCG tablet Take 1,000 mcg by mouth daily.      [name] warfarin (COUMADIN) 4 MG tablet Take 2 tablets (8 mg total) by mouth at bedtime. Continue home dose of 8mg , Needs INR check in 4-5days    . ferrous sulfate 325 (65 FE) MG EC tablet Take 325 mg by mouth daily with breakfast.    . pantoprazole (PROTONIX) 40 MG tablet Take 1 tablet (40 mg total) by mouth daily. (Patient not taking: Reported on 05/01/2017) 30 tablet 0  . temazepam (RESTORIL) 15 MG capsule Take 1 capsule (15 mg total) by mouth at bedtime as needed for sleep. 30 capsule 4   No current facility-administered medications for this visit.     Neurologic: Headache: No Seizure: No Paresthesias:No  Musculoskeletal: Strength & Muscle Tone:  Gait & Station: unsteady Patient leans: Right  Psychiatric Specialty Exam: ROS  Blood pressure 108/60, pulse 75, height 6\' 4"  (1.93 m), weight 229 lb (103.9 kg).Body mass index is 27.87 kg/m.  General Appearance: Casual  Eye Contact:  Good  Speech:  Clear and Coherent  Volume:  Normal  Mood:  Euthymic  Affect:  Appropriate  Thought Process:  Goal Directed  Orientation:  NA  Thought Content:  Logical   Suicidal Thoughts:  No  Homicidal Thoughts:  No  Memory:  Negative  Judgement:  Good  Insight:  Fair  Psychomotor Activity:  Decreased  Concentration:    Recall:  Good  Fund of Knowledge:Fair  Language: Fair  Akathisia:  No  Handed:  Right  AIMS (if indicated):    Assets:  Desire for Improvement  ADL's:  Impaired  Cognition: Impaired,  Moderate  Sleep:      Treatment Plan Summary: At this time I suspect this patient has some low-level dementia. The family says that she's had a Mini-Mental Status exam evaluated by a neurologist and they did not find any abnormalities. This ossification is disoriented to year month and day. I do think the patient likely has some memory impairment. I suspect there was some poorly detected medical state was let the patient having essentially episode of delirium on top of dementia. The use of a stabilizer Risperdal was beneficial for this patient and I think that's why he got much better. Interestingly the family is concerned taking any medicines away. They remember how on Saturday he was. They understand how difficult it was dealing with was delirious. Today he had a long discussion dangers of Risperdal for heart attack. Given the fact that this patient does have heart attacks I am concerned. Nonetheless the pros and cons the patient and his family agree to continue this for now. My goal be to slowly take them off fair. Continue taking Lexapro 10 mg we will discontinue his trazodone and replace it with Restoril 15 mg. This patient was seen again in 3 months. The family was told to call for problems.   Jerral Ralph, MD 9/5/20183:16 PM

## 2017-05-20 ENCOUNTER — Other Ambulatory Visit: Payer: Self-pay | Admitting: Cardiovascular Disease

## 2017-05-20 NOTE — Telephone Encounter (Signed)
Please call office and schedule appointment for October 2018 [phone]

## 2017-05-27 ENCOUNTER — Telehealth (HOSPITAL_COMMUNITY): Payer: Self-pay

## 2017-05-27 NOTE — Telephone Encounter (Signed)
Patients daughter calling, she states her dad is in perpetual motion, he is constantly pacing, siting down and getting up. He does this to a point that he is worn out. She would like to know if there is anything you can do.

## 2017-05-29 MED ORDER — TRAZODONE HCL 50 MG PO TABS: 25.0000 mg | ORAL_TABLET | Freq: Two times a day (BID) | ORAL | 0 refills | Status: DC

## 2017-05-29 NOTE — Telephone Encounter (Signed)
Per Dr. Casimiro Needle, I called in Trazodone 25 mg bid. I called patients wife and let her know, they will call me later this week to let me know if it is helping

## 2017-06-05 ENCOUNTER — Telehealth (HOSPITAL_COMMUNITY): Payer: Self-pay

## 2017-06-05 MED ORDER — LORAZEPAM 1 MG PO TABS: 1.0000 mg | ORAL_TABLET | Freq: Every day | ORAL | 2 refills | Status: AC

## 2017-06-05 NOTE — Telephone Encounter (Signed)
Patients daughter is calling, her father is still having issues with movement. She says that he is up and down all day and then when he goes to bed he moves from one side of the bed to the other and moves his legs like he is riding a bicycle. Please review and advise, thank you

## 2017-06-05 NOTE — Telephone Encounter (Signed)
Dr. Casimiro Needle spoke to patients daughter. They are going to d/c the Temazepam and start Ativan 1 mg at bedtime. I called the prescription into the pharmacy and patients daughter is aware.

## 2017-06-19 ENCOUNTER — Telehealth (HOSPITAL_COMMUNITY): Payer: Self-pay

## 2017-06-19 NOTE — Telephone Encounter (Signed)
Patients daughter is calling, they have gotten him a hospital bed because patient keeps getting out of bed at night. They currently give him Ativan 1 mg and Trazodone 50 mg at bedtime. You had also prescribed the Temazepam 15 mg and patients daughter would like to know if they can give him that too, because he is not sleeping through the night. She does not want to overmedicate him, but when he does not sleep he tries to get up. Please review and advise, thank you

## 2017-06-21 NOTE — Telephone Encounter (Signed)
I spoke with Dr. Casimiro Needle and he said to discontinue the Trazodone and increase the Temazepam to 30 mg at bedtime. Crystal called patients daughter and let her know, they will call back if that does not work.

## 2017-06-24 DIAGNOSIS — I2510 Atherosclerotic heart disease of native coronary artery without angina pectoris: Secondary | ICD-10-CM | POA: Diagnosis not present

## 2017-06-24 DIAGNOSIS — G4733 Obstructive sleep apnea (adult) (pediatric): Secondary | ICD-10-CM | POA: Diagnosis not present

## 2017-06-24 DIAGNOSIS — R296 Repeated falls: Secondary | ICD-10-CM | POA: Diagnosis not present

## 2017-06-24 DIAGNOSIS — F0391 Unspecified dementia with behavioral disturbance: Secondary | ICD-10-CM | POA: Diagnosis not present

## 2017-06-24 DIAGNOSIS — I480 Paroxysmal atrial fibrillation: Secondary | ICD-10-CM | POA: Diagnosis not present

## 2017-06-26 ENCOUNTER — Other Ambulatory Visit (HOSPITAL_COMMUNITY): Payer: Self-pay

## 2017-06-26 DIAGNOSIS — I480 Paroxysmal atrial fibrillation: Secondary | ICD-10-CM | POA: Diagnosis not present

## 2017-06-26 DIAGNOSIS — G4733 Obstructive sleep apnea (adult) (pediatric): Secondary | ICD-10-CM | POA: Diagnosis not present

## 2017-06-26 DIAGNOSIS — F0391 Unspecified dementia with behavioral disturbance: Secondary | ICD-10-CM | POA: Diagnosis not present

## 2017-06-26 DIAGNOSIS — I2510 Atherosclerotic heart disease of native coronary artery without angina pectoris: Secondary | ICD-10-CM | POA: Diagnosis not present

## 2017-06-26 DIAGNOSIS — R296 Repeated falls: Secondary | ICD-10-CM | POA: Diagnosis not present

## 2017-06-26 MED ORDER — TRAZODONE HCL 50 MG PO TABS: 50.0000 mg | ORAL_TABLET | Freq: Every day | ORAL | 0 refills | Status: AC

## 2017-06-28 ENCOUNTER — Inpatient Hospital Stay (HOSPITAL_COMMUNITY)
Admission: EM | Admit: 2017-06-28 | Discharge: 2017-07-02 | DRG: 884 | Disposition: A | Discharge status: Hospice | Payer: PPO | Attending: Internal Medicine | Admitting: Internal Medicine

## 2017-06-28 ENCOUNTER — Emergency Department (HOSPITAL_COMMUNITY): Payer: PPO

## 2017-06-28 ENCOUNTER — Encounter (HOSPITAL_COMMUNITY): Payer: Self-pay | Admitting: Internal Medicine

## 2017-06-28 DIAGNOSIS — Z951 Presence of aortocoronary bypass graft: Secondary | ICD-10-CM | POA: Diagnosis not present

## 2017-06-28 DIAGNOSIS — K219 Gastro-esophageal reflux disease without esophagitis: Secondary | ICD-10-CM | POA: Diagnosis present

## 2017-06-28 DIAGNOSIS — R410 Disorientation, unspecified: Secondary | ICD-10-CM | POA: Diagnosis present

## 2017-06-28 DIAGNOSIS — I482 Chronic atrial fibrillation: Secondary | ICD-10-CM | POA: Diagnosis present

## 2017-06-28 DIAGNOSIS — Z85528 Personal history of other malignant neoplasm of kidney: Secondary | ICD-10-CM | POA: Diagnosis not present

## 2017-06-28 DIAGNOSIS — D6832 Hemorrhagic disorder due to extrinsic circulating anticoagulants: Secondary | ICD-10-CM | POA: Diagnosis not present

## 2017-06-28 DIAGNOSIS — R29810 Facial weakness: Secondary | ICD-10-CM | POA: Diagnosis not present

## 2017-06-28 DIAGNOSIS — R4182 Altered mental status, unspecified: Secondary | ICD-10-CM | POA: Diagnosis not present

## 2017-06-28 DIAGNOSIS — E785 Hyperlipidemia, unspecified: Secondary | ICD-10-CM | POA: Diagnosis present

## 2017-06-28 DIAGNOSIS — W19XXXD Unspecified fall, subsequent encounter: Secondary | ICD-10-CM | POA: Diagnosis not present

## 2017-06-28 DIAGNOSIS — R4781 Slurred speech: Secondary | ICD-10-CM | POA: Diagnosis not present

## 2017-06-28 DIAGNOSIS — Y92009 Unspecified place in unspecified non-institutional (private) residence as the place of occurrence of the external cause: Secondary | ICD-10-CM | POA: Diagnosis not present

## 2017-06-28 DIAGNOSIS — I10 Essential (primary) hypertension: Secondary | ICD-10-CM | POA: Diagnosis not present

## 2017-06-28 DIAGNOSIS — I639 Cerebral infarction, unspecified: Secondary | ICD-10-CM

## 2017-06-28 DIAGNOSIS — E876 Hypokalemia: Secondary | ICD-10-CM | POA: Diagnosis not present

## 2017-06-28 DIAGNOSIS — S5002XA Contusion of left elbow, initial encounter: Secondary | ICD-10-CM | POA: Diagnosis not present

## 2017-06-28 DIAGNOSIS — Z7901 Long term (current) use of anticoagulants: Secondary | ICD-10-CM | POA: Diagnosis not present

## 2017-06-28 DIAGNOSIS — R4020 Unspecified coma: Secondary | ICD-10-CM | POA: Diagnosis not present

## 2017-06-28 DIAGNOSIS — G92 Toxic encephalopathy: Secondary | ICD-10-CM | POA: Diagnosis present

## 2017-06-28 DIAGNOSIS — F0391 Unspecified dementia with behavioral disturbance: Principal | ICD-10-CM | POA: Diagnosis present

## 2017-06-28 DIAGNOSIS — M7989 Other specified soft tissue disorders: Secondary | ICD-10-CM | POA: Diagnosis not present

## 2017-06-28 DIAGNOSIS — W19XXXA Unspecified fall, initial encounter: Secondary | ICD-10-CM | POA: Diagnosis present

## 2017-06-28 DIAGNOSIS — R791 Abnormal coagulation profile: Secondary | ICD-10-CM | POA: Diagnosis present

## 2017-06-28 DIAGNOSIS — Z952 Presence of prosthetic heart valve: Secondary | ICD-10-CM

## 2017-06-28 DIAGNOSIS — I359 Nonrheumatic aortic valve disorder, unspecified: Secondary | ICD-10-CM | POA: Diagnosis present

## 2017-06-28 DIAGNOSIS — N179 Acute kidney failure, unspecified: Secondary | ICD-10-CM

## 2017-06-28 DIAGNOSIS — Z87891 Personal history of nicotine dependence: Secondary | ICD-10-CM

## 2017-06-28 DIAGNOSIS — Z515 Encounter for palliative care: Secondary | ICD-10-CM | POA: Diagnosis not present

## 2017-06-28 DIAGNOSIS — I6789 Other cerebrovascular disease: Secondary | ICD-10-CM | POA: Diagnosis not present

## 2017-06-28 DIAGNOSIS — Z7982 Long term (current) use of aspirin: Secondary | ICD-10-CM | POA: Diagnosis not present

## 2017-06-28 DIAGNOSIS — S8002XA Contusion of left knee, initial encounter: Secondary | ICD-10-CM | POA: Diagnosis present

## 2017-06-28 DIAGNOSIS — N400 Enlarged prostate without lower urinary tract symptoms: Secondary | ICD-10-CM | POA: Diagnosis not present

## 2017-06-28 DIAGNOSIS — I2510 Atherosclerotic heart disease of native coronary artery without angina pectoris: Secondary | ICD-10-CM | POA: Diagnosis not present

## 2017-06-28 DIAGNOSIS — T45511A Poisoning by anticoagulants, accidental (unintentional), initial encounter: Secondary | ICD-10-CM

## 2017-06-28 DIAGNOSIS — M25522 Pain in left elbow: Secondary | ICD-10-CM | POA: Diagnosis not present

## 2017-06-28 DIAGNOSIS — Z66 Do not resuscitate: Secondary | ICD-10-CM | POA: Diagnosis present

## 2017-06-28 DIAGNOSIS — R296 Repeated falls: Secondary | ICD-10-CM | POA: Diagnosis not present

## 2017-06-28 DIAGNOSIS — T45515A Adverse effect of anticoagulants, initial encounter: Secondary | ICD-10-CM | POA: Diagnosis not present

## 2017-06-28 DIAGNOSIS — Z79899 Other long term (current) drug therapy: Secondary | ICD-10-CM

## 2017-06-28 DIAGNOSIS — R531 Weakness: Secondary | ICD-10-CM | POA: Diagnosis not present

## 2017-06-28 DIAGNOSIS — F03918 Unspecified dementia, unspecified severity, with other behavioral disturbance: Secondary | ICD-10-CM | POA: Diagnosis present

## 2017-06-28 DIAGNOSIS — I517 Cardiomegaly: Secondary | ICD-10-CM | POA: Diagnosis not present

## 2017-06-28 DIAGNOSIS — R451 Restlessness and agitation: Secondary | ICD-10-CM | POA: Diagnosis present

## 2017-06-28 DIAGNOSIS — S0990XA Unspecified injury of head, initial encounter: Secondary | ICD-10-CM | POA: Diagnosis not present

## 2017-06-28 DIAGNOSIS — Z5181 Encounter for therapeutic drug level monitoring: Secondary | ICD-10-CM | POA: Diagnosis not present

## 2017-06-28 DIAGNOSIS — T45514A Poisoning by anticoagulants, undetermined, initial encounter: Secondary | ICD-10-CM | POA: Diagnosis not present

## 2017-06-28 DIAGNOSIS — I4891 Unspecified atrial fibrillation: Secondary | ICD-10-CM | POA: Diagnosis present

## 2017-06-28 DIAGNOSIS — S59902A Unspecified injury of left elbow, initial encounter: Secondary | ICD-10-CM | POA: Diagnosis not present

## 2017-06-28 LAB — APTT: APTT: 46 s — AB (ref 24–36)

## 2017-06-28 LAB — COMPREHENSIVE METABOLIC PANEL
ALBUMIN: 3.5 g/dL (ref 3.5–5.0)
ALK PHOS: 92 U/L (ref 38–126)
ALT: 15 U/L — AB (ref 17–63)
AST: 18 U/L (ref 15–41)
Anion gap: 6 (ref 5–15)
BILIRUBIN TOTAL: 1.2 mg/dL (ref 0.3–1.2)
BUN: 20 mg/dL (ref 6–20)
CALCIUM: 9.6 mg/dL (ref 8.9–10.3)
CO2: 31 mmol/L (ref 22–32)
CREATININE: 1.29 mg/dL — AB (ref 0.61–1.24)
Chloride: 106 mmol/L (ref 101–111)
GFR calc Af Amer: 58 mL/min — ABNORMAL LOW (ref >60)
GFR calc non Af Amer: 50 mL/min — ABNORMAL LOW (ref >60)
GLUCOSE: 118 mg/dL — AB (ref 65–99)
POTASSIUM: 3.3 mmol/L — AB (ref 3.5–5.1)
Sodium: 143 mmol/L (ref 135–145)
TOTAL PROTEIN: 6.3 g/dL — AB (ref 6.5–8.1)

## 2017-06-28 LAB — DIFFERENTIAL
BASOS ABS: 0 10*3/uL (ref 0.0–0.1)
Basophils Relative: 0 %
Eosinophils Absolute: 0.3 10*3/uL (ref 0.0–0.7)
Eosinophils Relative: 4 %
LYMPHS ABS: 1.8 10*3/uL (ref 0.7–4.0)
LYMPHS PCT: 29 %
Monocytes Absolute: 0.4 10*3/uL (ref 0.1–1.0)
Monocytes Relative: 7 %
NEUTROS ABS: 3.7 10*3/uL (ref 1.7–7.7)
NEUTROS PCT: 60 %

## 2017-06-28 LAB — URINALYSIS, COMPLETE (UACMP) WITH MICROSCOPIC
BACTERIA UA: NONE SEEN
Bilirubin Urine: NEGATIVE
Glucose, UA: NEGATIVE mg/dL
Hgb urine dipstick: NEGATIVE
Ketones, ur: NEGATIVE mg/dL
Leukocytes, UA: NEGATIVE
Nitrite: NEGATIVE
Protein, ur: NEGATIVE mg/dL
SPECIFIC GRAVITY, URINE: 1.019 (ref 1.005–1.030)
SQUAMOUS EPITHELIAL / LPF: NONE SEEN
pH: 5 (ref 5.0–8.0)

## 2017-06-28 LAB — RAPID URINE DRUG SCREEN, HOSP PERFORMED
AMPHETAMINES: NOT DETECTED
Barbiturates: NOT DETECTED
Benzodiazepines: POSITIVE — AB
Cocaine: NOT DETECTED
Opiates: NOT DETECTED
Tetrahydrocannabinol: NOT DETECTED

## 2017-06-28 LAB — CBC
HEMATOCRIT: 36.4 % — AB (ref 39.0–52.0)
HEMOGLOBIN: 12.1 g/dL — AB (ref 13.0–17.0)
MCH: 28.8 pg (ref 26.0–34.0)
MCHC: 33.2 g/dL (ref 30.0–36.0)
MCV: 86.7 fL (ref 78.0–100.0)
Platelets: 182 10*3/uL (ref 150–400)
RBC: 4.2 MIL/uL — AB (ref 4.22–5.81)
RDW: 12.8 % (ref 11.5–15.5)
WBC: 6.3 10*3/uL (ref 4.0–10.5)

## 2017-06-28 LAB — CBG MONITORING, ED: GLUCOSE-CAPILLARY: 101 mg/dL — AB (ref 65–99)

## 2017-06-28 LAB — PROTIME-INR
INR: 4.48 — AB
Prothrombin Time: 42.3 seconds — ABNORMAL HIGH (ref 11.4–15.2)

## 2017-06-28 LAB — ETHANOL: Alcohol, Ethyl (B): <10 mg/dL (ref <10)

## 2017-06-28 MED ORDER — KETOROLAC TROMETHAMINE 15 MG/ML IJ SOLN
15.0000 mg | Freq: Four times a day (QID) | INTRAMUSCULAR | Status: DC | PRN
  Administered 2017-06-28: 15 mg via INTRAVENOUS
  Filled 2017-06-28: qty 1

## 2017-06-28 MED ORDER — DIGOXIN 125 MCG PO TABS
0.1250 mg | ORAL_TABLET | Freq: Every day | ORAL | Status: DC
  Administered 2017-06-28 – 2017-07-02 (×5): 0.125 mg via ORAL
  Filled 2017-06-28 (×6): qty 1

## 2017-06-28 MED ORDER — TEMAZEPAM 30 MG PO CAPS: 30.0000 mg | ORAL_CAPSULE | Freq: Every evening | ORAL | Status: DC | PRN

## 2017-06-28 MED ORDER — ASPIRIN EC 81 MG PO TBEC
81.0000 mg | DELAYED_RELEASE_TABLET | Freq: Every day | ORAL | Status: DC
  Administered 2017-06-28 – 2017-07-02 (×5): 81 mg via ORAL
  Filled 2017-06-28 (×6): qty 1

## 2017-06-28 MED ORDER — LORAZEPAM 1 MG PO TABS: 1.0000 mg | ORAL_TABLET | Freq: Once | ORAL | Status: DC

## 2017-06-28 MED ORDER — SODIUM CHLORIDE 0.9% FLUSH
3.0000 mL | Freq: Two times a day (BID) | INTRAVENOUS | Status: DC
  Administered 2017-06-28: 21:00:00 via INTRAVENOUS

## 2017-06-28 MED ORDER — ESCITALOPRAM OXALATE 10 MG PO TABS
10.0000 mg | ORAL_TABLET | Freq: Every day | ORAL | Status: DC
  Administered 2017-06-28 – 2017-07-02 (×5): 10 mg via ORAL
  Filled 2017-06-28 (×6): qty 1

## 2017-06-28 MED ORDER — RISPERIDONE 0.5 MG PO TABS
0.5000 mg | ORAL_TABLET | Freq: Three times a day (TID) | ORAL | Status: DC
  Administered 2017-06-28 – 2017-07-02 (×13): 0.5 mg via ORAL
  Filled 2017-06-28 (×16): qty 1

## 2017-06-28 MED ORDER — RAMIPRIL 2.5 MG PO CAPS
10.0000 mg | ORAL_CAPSULE | Freq: Two times a day (BID) | ORAL | Status: DC
  Administered 2017-06-28 – 2017-07-02 (×8): 10 mg via ORAL
  Filled 2017-06-28 (×7): qty 4
  Filled 2017-06-28: qty 1
  Filled 2017-06-28: qty 4

## 2017-06-28 MED ORDER — FERROUS SULFATE 325 (65 FE) MG PO TBEC: 325.0000 mg | DELAYED_RELEASE_TABLET | Freq: Every day | ORAL | Status: DC

## 2017-06-28 MED ORDER — POTASSIUM CHLORIDE IN NACL 20-0.9 MEQ/L-% IV SOLN
INTRAVENOUS | Status: DC
  Administered 2017-06-28 – 2017-06-30 (×4): via INTRAVENOUS
  Administered 2017-07-01: 1000 mL via INTRAVENOUS
  Administered 2017-07-02: 04:00:00 via INTRAVENOUS
  Filled 2017-06-28 (×11): qty 1000

## 2017-06-28 MED ORDER — WARFARIN SODIUM 4 MG PO TABS: 4.0000 mg | ORAL_TABLET | Freq: Every day | ORAL | Status: DC

## 2017-06-28 MED ORDER — ACETAMINOPHEN 325 MG PO TABS: 650.0000 mg | ORAL_TABLET | Freq: Four times a day (QID) | ORAL | Status: DC | PRN

## 2017-06-28 MED ORDER — AMLODIPINE BESYLATE 10 MG PO TABS
10.0000 mg | ORAL_TABLET | Freq: Every day | ORAL | Status: DC
  Administered 2017-06-28 – 2017-07-02 (×5): 10 mg via ORAL
  Filled 2017-06-28 (×2): qty 1
  Filled 2017-06-28: qty 2
  Filled 2017-06-28 (×2): qty 1

## 2017-06-28 MED ORDER — TRAZODONE HCL 50 MG PO TABS
50.0000 mg | ORAL_TABLET | Freq: Every day | ORAL | Status: DC
  Administered 2017-06-28 – 2017-07-01 (×4): 50 mg via ORAL
  Filled 2017-06-28 (×4): qty 1

## 2017-06-28 MED ORDER — VITAMIN B-12 1000 MCG PO TABS
1000.0000 ug | ORAL_TABLET | Freq: Every day | ORAL | Status: DC
  Administered 2017-06-29 – 2017-07-02 (×4): 1000 ug via ORAL
  Filled 2017-06-28 (×4): qty 1

## 2017-06-28 MED ORDER — VITAMIN D 1000 UNITS PO TABS
1000.0000 [IU] | ORAL_TABLET | Freq: Every day | ORAL | Status: DC
  Administered 2017-06-28 – 2017-07-02 (×5): 1000 [IU] via ORAL
  Filled 2017-06-28 (×5): qty 1

## 2017-06-28 MED ORDER — ATORVASTATIN CALCIUM 40 MG PO TABS
40.0000 mg | ORAL_TABLET | Freq: Every day | ORAL | Status: DC
  Administered 2017-06-28 – 2017-07-02 (×5): 40 mg via ORAL
  Filled 2017-06-28 (×6): qty 1

## 2017-06-28 MED ORDER — LORAZEPAM 1 MG PO TABS: 1.0000 mg | ORAL_TABLET | Freq: Every day | ORAL | Status: DC

## 2017-06-28 MED ORDER — ONDANSETRON HCL 4 MG PO TABS: 4.0000 mg | ORAL_TABLET | Freq: Four times a day (QID) | ORAL | Status: DC | PRN

## 2017-06-28 MED ORDER — ACETAMINOPHEN 650 MG RE SUPP: 650.0000 mg | Freq: Four times a day (QID) | RECTAL | Status: DC | PRN

## 2017-06-28 MED ORDER — ASPIRIN 81 MG PO TBEC: 81.0000 mg | DELAYED_RELEASE_TABLET | Freq: Every day | ORAL | Status: DC

## 2017-06-28 MED ORDER — ONDANSETRON HCL 4 MG/2ML IJ SOLN: 4.0000 mg | Freq: Four times a day (QID) | INTRAMUSCULAR | Status: DC | PRN

## 2017-06-28 MED ORDER — POTASSIUM CHLORIDE ER 8 MEQ PO TBCR
8.0000 meq | EXTENDED_RELEASE_TABLET | Freq: Every day | ORAL | Status: DC
  Administered 2017-06-28: 8 meq via ORAL
  Filled 2017-06-28 (×2): qty 1

## 2017-06-28 MED ORDER — CARVEDILOL 6.25 MG PO TABS
6.2500 mg | ORAL_TABLET | Freq: Two times a day (BID) | ORAL | Status: DC
  Administered 2017-06-28 – 2017-07-02 (×9): 6.25 mg via ORAL
  Filled 2017-06-28 (×10): qty 1

## 2017-06-28 MED ORDER — LORAZEPAM 2 MG/ML IJ SOLN
1.0000 mg | Freq: Once | INTRAMUSCULAR | Status: AC
  Administered 2017-06-28: 1 mg via INTRAVENOUS
  Filled 2017-06-28: qty 1

## 2017-06-28 MED ORDER — TORSEMIDE 20 MG PO TABS
10.0000 mg | ORAL_TABLET | Freq: Two times a day (BID) | ORAL | Status: DC
  Administered 2017-06-28 – 2017-07-02 (×9): 10 mg via ORAL
  Filled 2017-06-28 (×9): qty 1

## 2017-06-28 MED ORDER — STROKE: EARLY STAGES OF RECOVERY BOOK
Freq: Once | Status: AC
  Administered 2017-06-28: 22:00:00
  Filled 2017-06-28: qty 1

## 2017-06-28 MED ORDER — NITROGLYCERIN 0.4 MG SL SUBL: 0.4000 mg | SUBLINGUAL_TABLET | SUBLINGUAL | Status: DC | PRN

## 2017-06-28 MED ORDER — TEMAZEPAM 15 MG PO CAPS
15.0000 mg | ORAL_CAPSULE | Freq: Every evening | ORAL | Status: DC | PRN
  Administered 2017-07-01: 15 mg via ORAL
  Filled 2017-06-28: qty 1

## 2017-06-28 NOTE — Progress Notes (Signed)
Pt agitated and keeps removing telemetry. MD made aware and CCMD. Will continue to monitor. MD D/C'd telemetry.

## 2017-06-28 NOTE — ED Provider Notes (Signed)
[facility] EMERGENCY DEPARTMENT Provider Note   CSN: [id number] Arrival date & time: 06/28/17  0830     History   Chief Complaint Chief Complaint  Patient presents with  . Cerebrovascular Accident    HPI [name] is a 83 y.o. male.  Patient with history of coronary artery disease, ablation on Warfarin --presents from home.  Patient has had slurred speech, confusion, and left-sided facial droop.  Last known well was 5 PM yesterday.  Patient was found unresponsive at home today by family.  He was in his bed.  Apparently he coughed and became more alert.  Level 5 caveat due to altered mental status and history of dementia.   Per family, patient has been more lethargic the past 2 days. Decreased appetite. He fell and may have hit his head 5 days ago. Pt's wife could not wake him this morning and he didn't move much during the night. When kids arrived, he would open his eyes.       Past Medical History:  Diagnosis Date  . Aortic valve replaced   . Arthritis   . Atrial fibrillation ([place])    longterm persistent  . Back pain, chronic    SINCE BACK INJURY / FRACTURE  . BPH (benign prostatic hyperplasia)    grapey  . CAD (coronary artery disease)   . Cancer (HCC)    RIGHT RENAL  . Constipation   . Cough 06/16/12   C/O OF COUGH / COLD FOR A COUPLE OF WEEKS  . Dementia   . Diverticulitis    history  . Elevated fasting glucose 2012  . GERD (gastroesophageal reflux disease)   . HTN (hypertension)   . Hyperlipidemia   . Hypokalemia   . Incontinence   . Kidney disorder    ablation defect in the right kidney lower pole but progression of interpolar lesion: stable on repeat CT (06/2014)  . Right renal mass    renal cell carcinoma  . Shortness of breath    PT STATES RELATED TO HIS AF AND HEART BEING OUT OF RHYTHM  . Stomach ulcer     Patient Active Problem List   Diagnosis Date Noted  . Sepsis due to undetermined organism ([place]) 03/11/2017  . Fall  03/11/2017  . GI bleed 03/08/2017  . Dementia with behavioral disturbance 08/23/2016  . Right renal mass   . Renal carcinoma ([place])   . Spastic bladder 10/05/2014  . Encounter for therapeutic drug monitoring 11/06/2013  . Acute NSTEMI (non-ST elevated myocardial infarction) 09/26/2013  . Chronic calculous cholecystitis 09/08/2013  . Acalculous cholecystitis 09/04/2013  . Abdominal pain, other specified site 09/01/2013  . Constipation 09/01/2013  . Hypotension 09/01/2013  . Leukocytosis 09/01/2013  . Normocytic anemia 09/01/2013  . Hyponatremia 09/01/2013  . Bruit 04/21/2013  . Long term (current) use of anticoagulants 11/22/2010  . Chronic diastolic heart failure ([place]) 11/03/2010  . Aortic valve disorder 10/23/2010  . EDEMA 04/26/2010  . DYSPNEA 04/26/2010  . CHEST PAIN 04/26/2010  . Elevated lipids 01/21/2009  . HYPOKALEMIA 01/21/2009  . Essential hypertension 01/21/2009  . Coronary atherosclerosis 01/21/2009  . Atrial fibrillation ([place]) 01/21/2009  . ALTERED MENTAL STATUS 01/21/2009  . H/O mechanical aortic valve replacement 01/21/2009    Past Surgical History:  Procedure Laterality Date  . AORTIC VALVE REPLACEMENT  06/16/2002   St Jude [employer] Valve - OK for 1.5T or 3T, Normal SAR, < 3000 gauss/cm spatial gradient - S. [place] RT MRSO  . CHOLECYSTECTOMY N/A 09/08/2013  Procedure: LAPAROSCOPIC CHOLECYSTECTOMY WITH INTRAOPERATIVE CHOLANGIOGRAM;  Surgeon: Odis Hollingshead, MD;  Location: [facility] ORS;  Service: General;  Laterality: N/A;  . CORONARY ARTERY BYPASS GRAFT    . KNEE SURGERY    . LEFT HEART CATHETERIZATION WITH CORONARY/GRAFT ANGIOGRAM N/A 09/07/2013   Procedure: LEFT HEART CATHETERIZATION WITH Beatrix Fetters;  Surgeon: Sinclair Grooms, MD;  Location: [facility] C[facility] LAB;  Service: Cardiovascular;  Laterality: N/A;  . RADIOFREQUENCY ABLATION KIDNEY     RIGHT (two surgeries)  . Thoracic T12 compression fracture         Home Medications    Prior to Admission  medications   Medication Sig Start Date End Date Taking? Authorizing Provider  amLODipine (NORVASC) 10 MG tablet Take 1 tablet (10 mg total) by mouth daily. 03/10/17 03/10/18  Domenic Polite, MD  aspirin EC 81 MG EC tablet Take 1 tablet (81 mg total) by mouth daily. 09/11/13   Janece Canterbury, MD  atorvastatin (LIPITOR) 40 MG tablet Take 40 mg by mouth daily. 06/14/15   [provider]  carvedilol (COREG) 12.5 MG tablet Take 1 tablet (12.5 mg total) by mouth 2 (two) times daily with a meal. 03/13/17   Patrecia Pour, Christean Grief, MD  cholecalciferol (VITAMIN D) 1000 units tablet Take 1,000 Units by mouth daily.    [provider]  [place] 125 MCG tablet TAKE 1 TABLET(125 MCG) BY MOUTH DAILY 05/20/17   Josue Hector, MD  escitalopram (LEXAPRO) 10 MG tablet Take 10 mg by mouth daily.    [provider]  ferrous sulfate 325 (65 FE) MG EC tablet Take 325 mg by mouth daily with breakfast.    [provider]  LORazepam (ATIVAN) 1 MG tablet Take 1 tablet (1 mg total) by mouth at bedtime. 06/05/17 06/05/18  Plovsky, Berneta Sages, MD  nitroGLYCERIN (NITROSTAT) 0.4 MG SL tablet Place 1 tablet (0.4 mg total) under the tongue every 5 (five) minutes as needed. For chest pain 10/20/15   Josue Hector, MD  pantoprazole (PROTONIX) 40 MG tablet Take 1 tablet (40 mg total) by mouth daily. Patient not taking: Reported on 05/01/2017 03/10/17   Domenic Polite, MD  potassium chloride (KLOR-CON) 8 MEQ tablet TAKE 1 TABLET(8 MEQ) BY MOUTH DAILY 02/05/17   Josue Hector, MD  ramipril (ALTACE) 10 MG capsule Take 10 mg by mouth 2 (two) times daily.    [provider]  risperiDONE (RISPERDAL) 0.5 MG tablet Take 1 tablet (0.5 mg total) by mouth 3 (three) times daily. 01/31/17   Star Age, MD  temazepam (RESTORIL) 15 MG capsule Take 1 capsule (15 mg total) by mouth at bedtime as needed for sleep. 05/01/17   Plovsky, Berneta Sages, MD  torsemide (DEMADEX) 10 MG tablet TAKE 1 TABLET BY MOUTH TWICE DAILY  05/21/17   Josue Hector, MD  traZODone (DESYREL) 50 MG tablet Take 1 tablet (50 mg total) by mouth at bedtime. May take 2 qhs if needed 06/26/17   Plovsky, Berneta Sages, MD  vitamin B-12 (CYANOCOBALAMIN) 1000 MCG tablet Take 1,000 mcg by mouth daily.      [provider]  warfarin (COUMADIN) 4 MG tablet Take 2 tablets (8 mg total) by mouth at bedtime. Continue home dose of 8mg , Needs INR check in 4-5days 03/10/17   Domenic Polite, MD    Family History Family History  Problem Relation Age of Onset  . Hypertension Mother   . Diabetes Mother   . Hypertension Sister   . Diabetes Sister   . Hypertension Brother   .  Diabetes Brother     Social History Social History  Substance Use Topics  . Smoking status: Former Smoker    Packs/day: 2.00    Years: 20.00    Types: Cigarettes    Quit date: 08/27/1977  . Smokeless tobacco: Never Used  . Alcohol use No     Allergies   Patient has no known allergies.   Review of Systems Review of Systems  Unable to perform ROS: Dementia     Physical Exam Updated Vital Signs SpO2 96%   Physical Exam  Constitutional: He appears well-developed and well-nourished.  HENT:  Head: Normocephalic and atraumatic.  Eyes: Conjunctivae are normal. Right eye exhibits no discharge. Left eye exhibits no discharge.  Neck: Normal range of motion. Neck supple.  Cardiovascular: Normal rate, regular rhythm and normal heart sounds.   No murmur heard. Pulmonary/Chest: Effort normal and breath sounds normal. No respiratory distress. He has no wheezes. He has no rales.  Abdominal: Soft. There is no tenderness. There is no rebound and no guarding.  Musculoskeletal:       Left shoulder: He exhibits normal range of motion, no tenderness and no bony tenderness.       Left elbow: He exhibits swelling and effusion.       Left wrist: Normal. He exhibits normal range of motion, no tenderness and no bony tenderness.       Left hip: Normal. He exhibits normal range  of motion, normal strength and no tenderness.       Left knee: He exhibits swelling and ecchymosis.       Left ankle: Normal.       Left upper leg: Normal.       Left lower leg: Normal.       Left foot: Normal.  Neurological: He is alert.  Skin: Skin is warm and dry.  Psychiatric: He has a normal mood and affect.  Nursing note and vitals reviewed.    ED Treatments / Results  Labs (all labs ordered are listed, but only abnormal results are displayed) Labs Reviewed  PROTIME-INR - Abnormal; Notable for the following:       Result Value   Prothrombin Time 42.3 (*)    INR 4.48 (*)    All other components within normal limits  APTT - Abnormal; Notable for the following:    aPTT 46 (*)    All other components within normal limits  CBC - Abnormal; Notable for the following:    RBC 4.20 (*)    Hemoglobin 12.1 (*)    HCT 36.4 (*)    All other components within normal limits  COMPREHENSIVE METABOLIC PANEL - Abnormal; Notable for the following:    Potassium 3.3 (*)    Glucose, Bld 118 (*)    Creatinine, Ser 1.29 (*)    Total Protein 6.3 (*)    ALT 15 (*)    GFR calc non Af Amer 50 (*)    GFR calc Af Amer 58 (*)    All other components within normal limits  CBG MONITORING, ED - Abnormal; Notable for the following:    Glucose-Capillary 101 (*)    All other components within normal limits  ETHANOL  DIFFERENTIAL  RAPID URINE DRUG SCREEN, HOSP PERFORMED  URINALYSIS, ROUTINE W REFLEX MICROSCOPIC    EKG  EKG Interpretation None       Radiology Dg Chest 1 View  Result Date: 06/28/2017 CLINICAL DATA:  83 year old male with altered mental status. EXAM: CHEST 1 VIEW COMPARISON:  03/11/2017 FINDINGS: The cardiomediastinal silhouette is enlarged but unchanged in size and configuration. Median sternotomy wires noted. There is minimal bibasilar atelectasis without focal opacity, sizable pleural effusion or pneumothorax. No acute osseous abnormalities. IMPRESSION: Stable cardiomegaly  without acute intrathoracic process. Electronically Signed   By: Kristopher Oppenheim M.D.   On: 06/28/2017 09:10   Dg Elbow Complete Left  Result Date: 06/28/2017 CLINICAL DATA:  Acute left elbow pain following fall 2 days ago. Initial encounter. EXAM: LEFT ELBOW - COMPLETE 3+ VIEW COMPARISON:  None. FINDINGS: No acute fracture, subluxation or dislocation identified. There is no evidence of joint effusion. Posterior and medial soft tissue swelling noted. IMPRESSION: Soft tissue swelling without acute bony abnormality. Electronically Signed   By: Margarette Canada M.D.   On: 06/28/2017 10:28   Ct Head Wo Contrast  Result Date: 06/28/2017 CLINICAL DATA:  Altered level of consciousness EXAM: CT HEAD WITHOUT CONTRAST TECHNIQUE: Contiguous axial images were obtained from the base of the skull through the vertex without intravenous contrast. COMPARISON:  MRI 10/12/2016 FINDINGS: Brain: There is atrophy and chronic small vessel disease changes. No acute intracranial abnormality. Specifically, no hemorrhage, hydrocephalus, mass lesion, acute infarction, or significant intracranial injury. Vascular: No hyperdense vessel or unexpected calcification. Skull: No acute calvarial abnormality. Sinuses/Orbits: Visualized paranasal sinuses and mastoids clear. Orbital soft tissues unremarkable. Other: None IMPRESSION: No acute intracranial abnormality. Atrophy, chronic microvascular disease. Electronically Signed   By: Rolm Baptise M.D.   On: 06/28/2017 09:24    Procedures Procedures (including critical care time)  Medications Ordered in ED Medications  LORazepam (ATIVAN) tablet 1 mg (not administered)     Initial Impression / Assessment and Plan / ED Course  I have reviewed the triage vital signs and the nursing notes.  Pertinent labs & imaging results that were available during my care of the patient were reviewed by me and considered in my medical decision making (see chart for details).     Patient seen and  examined. Work-up initiated. Head CT reviewed by myself.   Vital signs reviewed and are as follows: BP (!) 111/59   Pulse 69   Resp 15   SpO2 93%   Discussed with and seen by Dr. Jeneen Rinks.  MRI ordered. Will admit to medicine.   Spoke with Dr. Evangeline Gula who will see patient.   Final Clinical Impressions(s) / ED Diagnoses   Final diagnoses:  Altered mental status, unspecified altered mental status type  Facial weakness   Admit for altered mental status, possible stroke/TIA.   New Prescriptions New Prescriptions   No medications on file     Carlisle Cater, Hershal Coria 06/28/17 1445    Tanna Furry, MD 07/08/17 1511

## 2017-06-28 NOTE — ED Notes (Signed)
Placed condom catheter per Burman Nieves (RN)

## 2017-06-28 NOTE — Progress Notes (Signed)
Pt is unable to answer admission questions and pt's grandson does not know most of the answers. [name] will be here in the morning and she will be able to answer most of the questions.

## 2017-06-28 NOTE — ED Provider Notes (Signed)
Pt seen and evaluated. D/W J. Geiple Pa. Pt altered. Left weakness. Normal Ct. Progressive dementia per history. Agree with admit for further eval, Stroke team/Therapy eval re: DC planning. Family reports progressive dementia.   Tanna Furry, MD 06/28/17 [phone]

## 2017-06-28 NOTE — ED Notes (Signed)
Attempted report 

## 2017-06-28 NOTE — ED Triage Notes (Signed)
Pt in from home via [facility], per report pt had fall x2 days ago hitting head on floor, pt takes Warfarin, pt hx of dementia, pt had slurred speech and Left sided facial droop onset yesterday at 5pm, pt arrives to ED today alert and at neuro baseline per family, pt has large hematoma to L elbow and L knee, pt has L sided facial droop and does and is not able to state month or age, pt MAE

## 2017-06-28 NOTE — Progress Notes (Signed)
Received report on pt.

## 2017-06-28 NOTE — ED Notes (Signed)
Patient transported to MRI 

## 2017-06-28 NOTE — Progress Notes (Signed)
[name] is a 83 y.o. male patient admitted from ED awake, alert - oriented  X 4 - no acute distress noted.  VSS - Blood pressure 120/87, pulse 89, temperature (!) 97.5 F (36.4 C), resp. rate (!) 24, SpO2 97 %.    IV in place, occlusive dsg intact without redness.  Orientation to room, and floor completed with information packet given to patient/family.  Patient declined safety video at this time.  Admission INP armband ID verified with patient/family, and in place.   SR up x 2, fall assessment complete, with patient and family able to verbalize understanding of risk associated with falls, and verbalized understanding to call nsg before up out of bed.  Call light within reach, patient able to voice, and demonstrate understanding.  Skin, clean-dry- intact with bruising on the left elbow, buttocks, and leg, or skin tears.   No evidence of skin break down noted on exam.     Will cont to eval and treat per MD orders.  Celine Ahr, RN 06/28/2017 5:49 PM

## 2017-06-28 NOTE — H&P (Signed)
History and Physical    [name] DZH:[id number] DOB: Jun 20, 1934 DOA: 06/28/2017  PCP: Cari Caraway, MD  NEUROLOGIST: Dr. Rexene Alberts (family wishes to switch)  Patient coming from: from home via EMS  I have personally reviewed patient's old medical records in [place]  Chief Complaint: Pt had fall x2 days ago hitting head on floor, pt takes Warfarin,  pt had slurred speech and Left sided facial droop onset yesterday at 5pm, pt has large hematoma to L elbow and L knee, pt has L sided facial droop and does and is not able to state month or age  HPI: [name] is a 83 y.o. male with medical history significant of coronary artery disease, aortic valve replacement on warfarin, coronary artery bypass graft, atrial fibrillation, benign prostatic hypertrophy, renal cell carcinoma (diagnosed in 2010 with recurrence in 2013 status post radiofrequency ablation), progressive dementia with behavioral disturbance (he was hospitalized at [facility] this year for stabilization and management of medications due to aggressive behavior, since that time he has been much more stable with no outbursts) who presents emergency department with complaints of left facial droop, falls at home, slurred speech, and multiple ecchymoses.  Patient has had slurred speech, confusion, and left-sided facial droop.  Last known well was 5 PM yesterday.  Patient was found unresponsive at home today by family.  He was in his bed.  Apparently he coughed and became more alert.  But remains very confused and agitated.  He is often agitated when he misses his usual daily medications.  He is unable to give me any accurate history.  He believes that the president is Janeice Robinson, he does not know the year, he thinks the month is September.  He does know he is in [place].  He does know he is in the hospital.  Per family, patient has been more lethargic the past 2 days. Decreased appetite. He fell and may have hit his head 5 days  ago. Pt's wife could not wake him this morning and he didn't move much during the night. When kids arrived, he would open his eyes.  He then did open his eyes an MRI of the brain was obtained and showed no acute stroke.  It did show moderate temporal volume loss which appears to be increased compared to prior in February 2018.  Mild to moderate for age periventricular chronic microvascular ischemic change. The patient is followed by Dr. Rexene Alberts (family wishes to change MD)  from neurology as well as Dr. Roland Earl from psychiatry.  His care has become more complicated as his dementia has worsened and he has evidence of acute delirium today.  He is truly unable to provide any history due to his significant dementia.  All of the history is obtained from the family.  They do not know if he has had strong scented urine as I do not provide his personal care that is provided by his wife and a care aide at home.  At the present time her urinalysis is pending.  Patient will be admitted to the hospital for further evaluation and management of acute delirium.      Review of Systems: As per HPI otherwise 10 point review of systems negative.   Review of Systems  Unable to perform ROS: Mental status change      Past Medical History:  Diagnosis Date  . Aortic valve replaced   . Arthritis   . Atrial fibrillation ([place])    longterm persistent; anticoagulated with warfarin  .  Back pain, chronic    SINCE BACK INJURY / FRACTURE  . BPH (benign prostatic hyperplasia)    grapey  . CAD (coronary artery disease)   . Cancer (HCC)    RIGHT RENAL  . Constipation   . Cough 06/16/12   C/O OF COUGH / COLD FOR A COUPLE OF WEEKS  . Dementia   . Diverticulitis    history  . Elevated fasting glucose 2012  . GERD (gastroesophageal reflux disease)   . HTN (hypertension)   . Hyperlipidemia   . Hypokalemia   . Incontinence   . Kidney disorder    ablation defect in the right kidney lower pole but progression of  interpolar lesion: stable on repeat CT (06/2014)  . Right renal mass    renal cell carcinoma  . Shortness of breath    PT STATES RELATED TO HIS AF AND HEART BEING OUT OF RHYTHM  . Stomach ulcer     Past Surgical History:  Procedure Laterality Date  . AORTIC VALVE REPLACEMENT  06/16/2002   [employer] Valve - OK for 1.5T or 3T, Normal SAR, < 3000 gauss/cm spatial gradient - S. Draughon RT MRSO  . CHOLECYSTECTOMY N/A 09/08/2013   Procedure: LAPAROSCOPIC CHOLECYSTECTOMY WITH INTRAOPERATIVE CHOLANGIOGRAM;  Surgeon: Odis Hollingshead, MD;  Location: [facility] ORS;  Service: General;  Laterality: N/A;  . CORONARY ARTERY BYPASS GRAFT    . KNEE SURGERY    . LEFT HEART CATHETERIZATION WITH CORONARY/GRAFT ANGIOGRAM N/A 09/07/2013   Procedure: LEFT HEART CATHETERIZATION WITH Beatrix Fetters;  Surgeon: Sinclair Grooms, MD;  Location: [facility] CATH LAB;  Service: Cardiovascular;  Laterality: N/A;  . RADIOFREQUENCY ABLATION KIDNEY     RIGHT (two surgeries)  . Thoracic T12 compression fracture       reports that he quit smoking about 39 years ago. His smoking use included Cigarettes. He has a 40.00 pack-year smoking history. He has never used smokeless tobacco. He reports that he does not drink alcohol or use drugs.  No Known Allergies  Family History  Problem Relation Age of Onset  . Hypertension Mother   . Diabetes Mother   . Hypertension Sister   . Diabetes Sister   . Hypertension Brother   . Diabetes Brother    Unacceptable: Noncontributory, unremarkable, or negative. Acceptable: Family history reviewed and not pertinent (If you reviewed it)  Prior to Admission medications   Medication Sig Start Date End Date Taking? Authorizing Provider  amLODipine (NORVASC) 10 MG tablet Take 1 tablet (10 mg total) by mouth daily. 03/10/17 03/10/18 Yes Domenic Polite, MD  aspirin EC 81 MG EC tablet Take 1 tablet (81 mg total) by mouth daily. 09/11/13  Yes Short, Noah Delaine, MD  atorvastatin (LIPITOR) 40  MG tablet Take 40 mg by mouth daily. 06/14/15  Yes [provider]  carvedilol (COREG) 6.25 MG tablet Take 6.25 mg by mouth 2 (two) times daily with a meal.   Yes [provider]  cholecalciferol (VITAMIN D) 1000 units tablet Take 1,000 Units by mouth daily.   Yes [provider]  [place] 125 MCG tablet TAKE 1 TABLET(125 MCG) BY MOUTH DAILY 05/20/17  Yes Josue Hector, MD  escitalopram (LEXAPRO) 10 MG tablet Take 10 mg by mouth daily.   Yes [provider]  LORazepam (ATIVAN) 1 MG tablet Take 1 tablet (1 mg total) by mouth at bedtime. 06/05/17 06/05/18 Yes Plovsky, Berneta Sages, MD  potassium chloride (KLOR-CON) 8 MEQ tablet TAKE 1 TABLET(8 MEQ) BY MOUTH DAILY 02/05/17  Yes Josue Hector, MD  ramipril (ALTACE) 10 MG capsule Take 10 mg by mouth 2 (two) times daily.   Yes [provider]  risperiDONE (RISPERDAL) 0.5 MG tablet Take 1 tablet (0.5 mg total) by mouth 3 (three) times daily. 01/31/17  Yes Star Age, MD  temazepam (RESTORIL) 15 MG capsule Take 1 capsule (15 mg total) by mouth at bedtime as needed for sleep. Patient taking differently: Take 30 mg by mouth at bedtime as needed for sleep.  05/01/17  Yes Plovsky, Berneta Sages, MD  torsemide (DEMADEX) 10 MG tablet TAKE 1 TABLET BY MOUTH TWICE DAILY 05/21/17  Yes Josue Hector, MD  vitamin B-12 (CYANOCOBALAMIN) 1000 MCG tablet Take 1,000 mcg by mouth daily.     Yes [provider]  warfarin (COUMADIN) 4 MG tablet Take 2 tablets (8 mg total) by mouth at bedtime. Continue home dose of 8mg , Needs INR check in 4-5days Patient taking differently: Take 4-8 mg by mouth at bedtime. 8 mg on Tuesday, Wednesday, Thursday, Saturday and Sunday. All other days are 4 mg 03/10/17  Yes Domenic Polite, MD  ferrous sulfate 325 (65 FE) MG EC tablet Take 325 mg by mouth daily with breakfast.    [provider]  nitroGLYCERIN (NITROSTAT) 0.4 MG SL tablet Place 1 tablet (0.4 mg total) under the tongue every 5 (five)  minutes as needed. For chest pain 10/20/15   Josue Hector, MD  traZODone (DESYREL) 50 MG tablet Take 1 tablet (50 mg total) by mouth at bedtime. May take 2 qhs if needed 06/26/17   Norma Fredrickson, MD    Physical Exam: Vitals:   06/28/17 1015 06/28/17 1045 06/28/17 1138 06/28/17 1300  BP: (!) 148/70   140/90  Pulse:  85  89  Resp:  15  (!) 24  Temp:   (!) 97.5 F (36.4 C)   SpO2:  98%  97%    Constitutional: NAD, calm, comfortable Vitals:   06/28/17 1015 06/28/17 1045 06/28/17 1138 06/28/17 1300  BP: (!) 148/70   140/90  Pulse:  85  89  Resp:  15  (!) 24  Temp:   (!) 97.5 F (36.4 C)   SpO2:  98%  97%   Physical Exam  Constitutional: He appears distressed (agitated).  HENT:  Head: Normocephalic and atraumatic.  Mouth/Throat: No oropharyngeal exudate.  Eyes: Pupils are equal, round, and reactive to light. Conjunctivae and EOM are normal. No scleral icterus.  Neck: Normal range of motion. Neck supple. No JVD present. No thyromegaly present.  Cardiovascular: An irregularly irregular rhythm present. Exam reveals no friction rub.   No murmur heard. Pulmonary/Chest: Effort normal and breath sounds normal. No respiratory distress.  Abdominal: Soft. Bowel sounds are normal. He exhibits no distension. There is no tenderness.  Musculoskeletal: Normal range of motion. He exhibits no edema or deformity.  Neurological: He has normal strength. He is agitated and disoriented. He displays abnormal speech (slightly slurred). He displays no tremor. No cranial nerve deficit.  Thinks the president is Janeice Robinson, thinks the month is September, does not know the year, recognizes family members, does not appreciate situation or context.  Skin: Skin is warm and dry.  Psychiatric: He is agitated. He expresses impulsivity. He exhibits disordered thought content and abnormal recent memory.    Labs on Admission: I have personally reviewed following labs and imaging studies  CBC:  Recent Labs Lab  06/28/17 0834  WBC 6.3  NEUTROABS 3.7  HGB 12.1*  HCT 36.4*  MCV 86.7  PLT 779   Basic Metabolic Panel:  Recent Labs Lab 06/28/17 0834  NA 143  K 3.3*  CL 106  CO2 31  GLUCOSE 118*  BUN 20  CREATININE 1.29*  CALCIUM 9.6   GFR: CrCl cannot be calculated (Unknown ideal weight.). Liver Function Tests:  Recent Labs Lab 06/28/17 0834  AST 18  ALT 15*  ALKPHOS 92  BILITOT 1.2  PROT 6.3*  ALBUMIN 3.5   No results for input(s): LIPASE, AMYLASE in the last 168 hours. No results for input(s): AMMONIA in the last 168 hours. Coagulation Profile:  Recent Labs Lab 06/28/17 0834  INR 4.48*   Cardiac Enzymes: No results for input(s): CKTOTAL, CKMB, CKMBINDEX, TROPONINI in the last 168 hours. BNP (last 3 results) No results for input(s): PROBNP in the last 8760 hours. HbA1C: No results for input(s): HGBA1C in the last 72 hours. CBG:  Recent Labs Lab 06/28/17 0851  GLUCAP 101*   Lipid Profile: No results for input(s): CHOL, HDL, LDLCALC, TRIG, CHOLHDL, LDLDIRECT in the last 72 hours. Thyroid Function Tests: No results for input(s): TSH, T4TOTAL, FREET4, T3FREE, THYROIDAB in the last 72 hours. Anemia Panel: No results for input(s): VITAMINB12, FOLATE, FERRITIN, TIBC, IRON, RETICCTPCT in the last 72 hours. Urine analysis:    Component Value Date/Time   COLORURINE YELLOW 06/28/2017 1412   APPEARANCEUR CLEAR 06/28/2017 1412   LABSPEC 1.019 06/28/2017 1412   PHURINE 5.0 06/28/2017 1412   GLUCOSEU NEGATIVE 06/28/2017 1412   HGBUR NEGATIVE 06/28/2017 1412   BILIRUBINUR NEGATIVE 06/28/2017 1412   KETONESUR NEGATIVE 06/28/2017 1412   PROTEINUR NEGATIVE 06/28/2017 1412   UROBILINOGEN 0.2 09/01/2013 1301   NITRITE NEGATIVE 06/28/2017 1412   LEUKOCYTESUR NEGATIVE 06/28/2017 1412    Radiological Exams on Admission: Dg Chest 1 View  Result Date: 06/28/2017 CLINICAL DATA:  83 year old male with altered mental status. EXAM: CHEST 1 VIEW COMPARISON:  03/11/2017  FINDINGS: The cardiomediastinal silhouette is enlarged but unchanged in size and configuration. Median sternotomy wires noted. There is minimal bibasilar atelectasis without focal opacity, sizable pleural effusion or pneumothorax. No acute osseous abnormalities. IMPRESSION: Stable cardiomegaly without acute intrathoracic process. Electronically Signed   By: Kristopher Oppenheim M.D.   On: 06/28/2017 09:10   Dg Elbow Complete Left  Result Date: 06/28/2017 CLINICAL DATA:  Acute left elbow pain following fall 2 days ago. Initial encounter. EXAM: LEFT ELBOW - COMPLETE 3+ VIEW COMPARISON:  None. FINDINGS: No acute fracture, subluxation or dislocation identified. There is no evidence of joint effusion. Posterior and medial soft tissue swelling noted. IMPRESSION: Soft tissue swelling without acute bony abnormality. Electronically Signed   By: Margarette Canada M.D.   On: 06/28/2017 10:28   Ct Head Wo Contrast  Result Date: 06/28/2017 CLINICAL DATA:  Altered level of consciousness EXAM: CT HEAD WITHOUT CONTRAST TECHNIQUE: Contiguous axial images were obtained from the base of the skull through the vertex without intravenous contrast. COMPARISON:  MRI 10/12/2016 FINDINGS: Brain: There is atrophy and chronic small vessel disease changes. No acute intracranial abnormality. Specifically, no hemorrhage, hydrocephalus, mass lesion, acute infarction, or significant intracranial injury. Vascular: No hyperdense vessel or unexpected calcification. Skull: No acute calvarial abnormality. Sinuses/Orbits: Visualized paranasal sinuses and mastoids clear. Orbital soft tissues unremarkable. Other: None IMPRESSION: No acute intracranial abnormality. Atrophy, chronic microvascular disease. Electronically Signed   By: Rolm Baptise M.D.   On: 06/28/2017 09:24   Mr Brain Wo Contrast  Result Date: 06/28/2017 CLINICAL DATA:  Altered level of consciousness.  Fall 2 days ago. EXAM: MRI  HEAD WITHOUT CONTRAST TECHNIQUE: Multiplanar, multiecho pulse  sequences of the brain and surrounding structures were obtained without intravenous contrast. COMPARISON:  10/12/2016. FINDINGS: Brain: No acute infarction or visible hemorrhage, hydrocephalus, extra-axial collection or mass lesion. Prominent cerebral atrophy with ventriculomegaly. Medial temporal volume loss is moderate. Mild to moderate for age periventricular chronic microvascular ischemic change. Vascular: Loss of flow void in the left sigmoid sinus is likely related to asymmetric slower flow. All images combined no dural venous sinus thrombosis suspected. Major arterial flow voids are preserved. Skull and upper cervical spine: No gross marrow lesion. Sinuses/Orbits: Bilateral cataract resection.  No acute finding. IMPRESSION: 1. No acute finding, including infarct. 2. Atrophy and chronic small vessel ischemia. 3. Motion degraded to the degree that pathology could be easily obscured. Electronically Signed   By: Monte Fantasia M.D.   On: 06/28/2017 12:54   Echocardiogram obtained on 03/11/2017:  - Left ventricle: The cavity size was normal. There was severe   concentric hypertrophy. Systolic function was normal. The   estimated ejection fraction was in the range of 60% to 65%. Wall   motion was normal; there were no regional wall motion   abnormalities. The study was not technically sufficient to allow   evaluation of LV diastolic dysfunction due to atrial   fibrillation. Doppler parameters are consistent with high   ventricular filling pressure. - Aortic valve: A mechanical prosthesis was present. The prosthesis   had a normal range of motion. Mean gradient (S): 23 mm Hg. Valve   area (VTI): 1.36 cm^2. Valve area (Vmax): 1.37 cm^2. Valve area   (Vmean): 1.24 cm^2. - Mitral valve: There was trivial regurgitation. - Left atrium: The atrium was mildly dilated. - Pulmonary arteries: PA peak pressure: 37 mm Hg (S).  Impressions:  - No significant change in mean AV gradient since last echo  2017.   The right ventricular systolic pressure was increased consistent   with mild pulmonary hypertension.   EKG: Independently reviewed.  Shows atrial fibrillation  Assessment/Plan Principal Problem:   Delirium Active Problems:   Warfarin poisoning   HYPOKALEMIA   Atrial fibrillation (HCC)   Dementia with behavioral disturbance   Fall   AKI (acute kidney injury) ([place])   Warfarin-induced coagulopathy ([place])   H/O mechanical aortic valve replacement   Aortic valve disorder   1.  Delirium: 83 year old gentleman with previously well-documented dementia with behavioral disturbance has been followed by a psychiatrist and a neurologist for these issues.  He was recently started on medications which greatly improved his behavioral issues.  He has no longer had any problems with that at home.  He is now able be care for it at home by his wife and aide.  Over the past 2 weeks he has had increasing confusion and more lethargy.  Overnight he was more lethargic than usual in fact he did not move around at all in the bed and that concerned his wife.  She woken him this morning and he had a left facial droop and he was dysarthric.  Family was concerned about a stroke and brought him in.  In the emergency department MRI was unremarkable.  There was no acute stroke.  Analysis was unremarkable.  The patient remains confused dysarthric and delirious.  Being admitted into the hospital to investigate causes of delirium to include infection and TIA while it is possible the patient may have missed some medication family says they do not think that is the case.  Given the complexities of the  situation and will consult neurology for further assistance.  2.  Warfarin poisoning: Patient's INR is 4.48.  His warfarin will be held we will monitor closely.  Etiology of increased warfarin is not entirely clear, but may be related to infection versus medication mishap.  3.  Hypokalemia: Potassium level is fairly low at  3.3 will replete with oral and IV potassium in his fluids.  We will recheck in a.m.  4.  Atrial fibrillation: Patient is currently rate controlled and on warfarin due to aortic valve replacement.  Warfarin cannot be discontinued due to his aortic valve unless the patient were to become a palliative care hospice patient.  With regards to his heart rate he is fairly well controlled on his dig and Coreg.  5.  Dementia with behavioral disturbance: Patient recently had an inpatient stay and is followed by a geriatric psychiatrist who has been working on adjusting his medications.  He is managing his Lexapro, lorazepam, risperidone and trazodone.  He states that he is planning to make some changes to the lorazepam and trazodone prescription has been obtained but they have not been able to fill it yet.  6.  Fall at home: Patient is actually had multiple falls at home.  It is quite difficult to discontinue anticoagulation in a patient who requires it for an aortic valve replacement.  Family is very concerned about his ability to continue to be independently mobile at home.  Patient has difficulty remembering to use an assistive device.  Difficult situation.  7.  Acute kidney injury: Creatinine elevated today normally runs at 0.7 currently 1.23.  Will hydrate patient and monitor closely.  8.  Warfarin-induced coagulopathy: See above.  9.  History of mechanical aortic valve replacement: Continue warfarin.  10.  Aortic valve disorder: As above.  11.  Disposition and complexity of care: Family's ultimate goal is to keep the patient at home with his wife if at all possible.  They are willing to investigate any means of care for him at home.  They are also interested in hospice as they believe that the patient has had significant deterioration.  I am very concerned about this new onset of delirium as it is a highly lethal condition with a 10% mortality.  We will consult palliative care for assistance with symptom  control and possible referral to hospice.   DVT prophylaxis: Already anticoagulated Code Status: DO NOT RESUSCITATE Family Communication: Spoke with patient's daughter and son who are both powers of attorney at the bedside.  I personally reviewed the patient's living will. Disposition Plan: Hopefully home with assistance in greater care possibly even hospice. Consults called: Dr. Malen Gauze from neurology Admission status: Observation   Lady Deutscher MD [facility] Pager [phone]  If 7PM-7AM, please contact night-coverage [email or web address] Password [facility]  06/28/2017, 3:26 PM

## 2017-06-28 NOTE — Consult Note (Signed)
Neurology Consultation  Reason for Consult: Altered mental status Referring Physician: Dr. Evangeline Gula   CC: Altered mental status  History is obtained from: Chart, patient daughter at bedside.  HPI: [name] is a 83 y.o. male 83 year old with a past medical history of dementia with behavioral problems, aortic valve replacement on Coumadin, atrial fibrillation, coronary artery disease, hypertension, hyperlipidemia, hypokalemia, was brought to the hospital by his daughters for evaluation of slurred speech, confusion and left-sided facial droop.  He was last known well yesterday at some point and was found unresponsive at home today by family.  He was in his bed.  Apparently he coughed and became more alert per the ED note. Patient has been becoming more lethargic for the past few days and has been having poor appetite.  He also had a fall a few days ago, hence concern for a possible bleed as he is on Coumadin for which she was scanned via CT scanner, which did not reveal any evidence of bleed.  At that time, he did not open his eyes for his wife, but did open that with for his children.  According to his daughter at bedside, he has been having a lot of behavioral problems.  He is currently on Risperdal, temazepam and lorazepam, which are being worked up by the outpatient geriatric psychiatrist have been prescribed by outpatient geriatric psychiatrist and he is currently working to discontinue some of them.  The outpatient geriatric psychiatrist wants to start trazodone instead of lorazepam.  Unclear why he is on 2 benzodiazepines.   He has had visits in the past with neurology, as well as admissions for behavioral issues.  He was also admitted at behavioral health and [facility], where the Risperdal was started and improved his clinical condition significantly according to the doctor.  There is no report of preceding illnesses, fevers chills nausea vomiting prior to this change having  been observed. Their main concern was the left-sided weakness facial droop and slurred speech, which prompted a fast trip to the ER.  Noncontrast CT of the head as well as MRI of the brain are negative for any acute processes although the images are very motion riddled  LKW: 1700 hrs. on 06/27/2017 tpa given?: no, outside the window Premorbid modified Rankin scale (mRS):  4  ROS: A 14 point ROS was performed and is negative except noted in the HPI, this was provided by the daughters and patient was unable to provide any history.  Past Medical History:  Diagnosis Date  . Aortic valve replaced   . Arthritis   . Atrial fibrillation ([place])    longterm persistent; anticoagulated with warfarin  . Back pain, chronic    SINCE BACK INJURY / FRACTURE  . BPH (benign prostatic hyperplasia)    grapey  . CAD (coronary artery disease)   . Cancer ([facility])    RIGHT RENAL  . Constipation   . Cough 06/16/12   C/O OF COUGH / COLD FOR A COUPLE OF WEEKS  . Dementia   . Diverticulitis    history  . Elevated fasting glucose 2012  . GERD (gastroesophageal reflux disease)   . HTN (hypertension)   . Hyperlipidemia   . Hypokalemia   . Incontinence   . Kidney disorder    ablation defect in the right kidney lower pole but progression of interpolar lesion: stable on repeat CT (06/2014)  . Right renal mass    renal cell carcinoma  . Shortness of breath    PT  STATES RELATED TO HIS AF AND HEART BEING OUT OF RHYTHM  . Stomach ulcer     Family History  Problem Relation Age of Onset  . Hypertension Mother   . Diabetes Mother   . Hypertension Sister   . Diabetes Sister   . Hypertension Brother   . Diabetes Brother     Social History:   reports that he quit smoking about 39 years ago. His smoking use included Cigarettes. He has a 40.00 pack-year smoking history. He has never used smokeless tobacco. He reports that he does not drink alcohol or use drugs. Medications  Current Facility-Administered  Medications:  .   stroke: mapping our early stages of recovery book, , Does not apply, Once, Randa Spike C, MD .  0.9 % NaCl with KCl 20 mEq/ L  infusion, , Intravenous, Continuous, Lady Deutscher, MD, Last Rate: 75 mL/hr at 06/28/17 1438 .  acetaminophen (TYLENOL) tablet 650 mg, 650 mg, Oral, Q6H PRN **OR** acetaminophen (TYLENOL) suppository 650 mg, 650 mg, Rectal, Q6H PRN, Lady Deutscher, MD .  amLODipine (NORVASC) tablet 10 mg, 10 mg, Oral, Daily, Geiple, Joshua, PA-C, 10 mg at 06/28/17 1323 .  aspirin EC tablet 81 mg, 81 mg, Oral, Daily, Lady Deutscher, MD .  atorvastatin (LIPITOR) tablet 40 mg, 40 mg, Oral, q1800, Carlisle Cater, PA-C .  carvedilol (COREG) tablet 6.25 mg, 6.25 mg, Oral, BID WC, Geiple, Joshua, PA-C .  cholecalciferol (VITAMIN D) tablet 1,000 Units, 1,000 Units, Oral, Daily, Carlisle Cater, PA-C, 1,000 Units at 06/28/17 1323 .  digoxin (LANOXIN) tablet 0.125 mg, 0.125 mg, Oral, Daily, Geiple, Joshua, PA-C .  escitalopram (LEXAPRO) tablet 10 mg, 10 mg, Oral, Daily, Geiple, Joshua, PA-C .  ketorolac (TORADOL) 15 MG/ML injection 15 mg, 15 mg, Intravenous, Q6H PRN, Lady Deutscher, MD, 15 mg at 06/28/17 1442 .  LORazepam (ATIVAN) tablet 1 mg, 1 mg, Oral, QHS, Geiple, Joshua, PA-C .  nitroGLYCERIN (NITROSTAT) SL tablet 0.4 mg, 0.4 mg, Sublingual, Q5 min PRN, Geiple, Joshua, PA-C .  ondansetron (ZOFRAN) tablet 4 mg, 4 mg, Oral, Q6H PRN **OR** ondansetron (ZOFRAN) injection 4 mg, 4 mg, Intravenous, Q6H PRN, Lady Deutscher, MD .  potassium chloride (KLOR-CON) CR tablet 8 mEq, 8 mEq, Oral, Daily, Geiple, Joshua, PA-C, 8 mEq at 06/28/17 1323 .  ramipril (ALTACE) capsule 10 mg, 10 mg, Oral, BID, Geiple, Joshua, PA-C .  risperiDONE (RISPERDAL) tablet 0.5 mg, 0.5 mg, Oral, TID, Geiple, Joshua, PA-C, 0.5 mg at 06/28/17 1323 .  sodium chloride flush (NS) 0.9 % injection 3 mL, 3 mL, Intravenous, Q12H, Evangeline Gula, Wyatt Haste, MD .  temazepam (RESTORIL) capsule 30 mg, 30  mg, Oral, QHS PRN, Carlisle Cater, PA-C .  torsemide (DEMADEX) tablet 10 mg, 10 mg, Oral, BID, Geiple, Joshua, PA-C, 10 mg at 06/28/17 1323 .  traZODone (DESYREL) tablet 50 mg, 50 mg, Oral, QHS, Geiple, Joshua, PA-C .  [START ON 06/29/2017] vitamin B-12 (CYANOCOBALAMIN) tablet 1,000 mcg, 1,000 mcg, Oral, Daily, Carlisle Cater, PA-C  Current Outpatient Prescriptions:  .  amLODipine (NORVASC) 10 MG tablet, Take 1 tablet (10 mg total) by mouth daily., Disp: 30 tablet, Rfl: 0 .  aspirin EC 81 MG EC tablet, Take 1 tablet (81 mg total) by mouth daily., Disp: 30 tablet, Rfl: 0 .  atorvastatin (LIPITOR) 40 MG tablet, Take 40 mg by mouth daily., Disp: , Rfl: 3 .  carvedilol (COREG) 6.25 MG tablet, Take 6.25 mg by mouth 2 (two) times daily with a meal., Disp: ,  Rfl:  .  cholecalciferol (VITAMIN D) 1000 units tablet, Take 1,000 Units by mouth daily., Disp: , Rfl:  .  DIGOX 125 MCG tablet, TAKE 1 TABLET(125 MCG) BY MOUTH DAILY, Disp: 30 tablet, Rfl: 11 .  escitalopram (LEXAPRO) 10 MG tablet, Take 10 mg by mouth daily., Disp: , Rfl:  .  LORazepam (ATIVAN) 1 MG tablet, Take 1 tablet (1 mg total) by mouth at bedtime., Disp: 30 tablet, Rfl: 2 .  potassium chloride (KLOR-CON) 8 MEQ tablet, TAKE 1 TABLET(8 MEQ) BY MOUTH DAILY, Disp: 30 tablet, Rfl: 9 .  ramipril (ALTACE) 10 MG capsule, Take 10 mg by mouth 2 (two) times daily., Disp: , Rfl:  .  risperiDONE (RISPERDAL) 0.5 MG tablet, Take 1 tablet (0.5 mg total) by mouth 3 (three) times daily., Disp: 270 tablet, Rfl: 1 .  temazepam (RESTORIL) 15 MG capsule, Take 1 capsule (15 mg total) by mouth at bedtime as needed for sleep. (Patient taking differently: Take 30 mg by mouth at bedtime as needed for sleep. ), Disp: 30 capsule, Rfl: 4 .  torsemide (DEMADEX) 10 MG tablet, TAKE 1 TABLET BY MOUTH TWICE DAILY, Disp: 60 tablet, Rfl: 5 .  vitamin B-12 (CYANOCOBALAMIN) 1000 MCG tablet, Take 1,000 mcg by mouth daily.  , Disp: , Rfl:  .  warfarin (COUMADIN) 4 MG tablet, Take 2  tablets (8 mg total) by mouth at bedtime. Continue home dose of 8mg , Needs INR check in 4-5days (Patient taking differently: Take 4-8 mg by mouth at bedtime. 8 mg on Tuesday, Wednesday, Thursday, Saturday and Sunday. All other days are 4 mg), Disp: , Rfl:  .  ferrous sulfate 325 (65 FE) MG EC tablet, Take 325 mg by mouth daily with breakfast., Disp: , Rfl:  .  nitroGLYCERIN (NITROSTAT) 0.4 MG SL tablet, Place 1 tablet (0.4 mg total) under the tongue every 5 (five) minutes as needed. For chest pain, Disp: 25 tablet, Rfl: 5 .  traZODone (DESYREL) 50 MG tablet, Take 1 tablet (50 mg total) by mouth at bedtime. May take 2 qhs if needed, Disp: 60 tablet, Rfl: 0  Exam: Current vital signs: BP 120/87   Pulse 89   Temp (!) 97.5 F (36.4 C)   Resp (!) 24   SpO2 97%  Vital signs in last 24 hours: Temp:  [97.5 F (36.4 C)] 97.5 F (36.4 C) (11/02 1138) Pulse Rate:  [69-89] 89 (11/02 1300) Resp:  [15-25] 24 (11/02 1300) BP: (111-148)/(59-90) 120/87 (11/02 1702) SpO2:  [93 %-98 %] 97 % (11/02 1300)  General: Awake alert, no acute distress appears a little restless HEENT: Normocephalic, atraumatic, moist oral mucous membranes Lungs: Clear to auscultation CVS: S1-S2 heard, regular rate Abdomen: Soft nondistended Extremities: Big bruise on the left elbow, otherwise unremarkable Neurological exam Awake, alert, follows simple commands, able to tell his name but got his date of birth wrong.  Could not tell me the current date. Poor attention concentration. Speech is mildly dysarthric.  Naming comprehension and repetition impaired. Cranial nerves: Pupils equal round reactive light, extra ocular movements intact, visual fields full to threat, facial sensation intact, face symmetric, auditory acuity reduced bilaterally, palate elevates at midline, shoulder shrug intact bilaterally, tongue midline. Motor exam: Nearly symmetric 5/5 strength in the upper extremities with no drift.  Nearly symmetric 4+/5  extremities strength in the legs bilaterally.  Normal tone.  Normal range of motion. Sensory exam: Intact to light touch all over Coordination: Intact finger-nose-finger although it takes a while for him to perform, I  do not think this is bradykinesia though Gait was not tested for patient safety  Labs I have reviewed labs in epic and the results pertinent to this consultation are: CBC    Component Value Date/Time   WBC 6.3 06/28/2017 0834   RBC 4.20 (L) 06/28/2017 0834   HGB 12.1 (L) 06/28/2017 0834   HCT 36.4 (L) 06/28/2017 0834   PLT 182 06/28/2017 0834   MCV 86.7 06/28/2017 0834   MCH 28.8 06/28/2017 0834   MCHC 33.2 06/28/2017 0834   RDW 12.8 06/28/2017 0834   LYMPHSABS 1.8 06/28/2017 0834   MONOABS 0.4 06/28/2017 0834   EOSABS 0.3 06/28/2017 0834   BASOSABS 0.0 06/28/2017 0834   CMP     Component Value Date/Time   NA 143 06/28/2017 0834   K 3.3 (L) 06/28/2017 0834   CL 106 06/28/2017 0834   CO2 31 06/28/2017 0834   GLUCOSE 118 (H) 06/28/2017 0834   BUN 20 06/28/2017 0834   CREATININE 1.29 (H) 06/28/2017 0834   CREATININE 0.95 11/07/2015 1006   CALCIUM 9.6 06/28/2017 0834   PROT 6.3 (L) 06/28/2017 0834   ALBUMIN 3.5 06/28/2017 0834   AST 18 06/28/2017 0834   ALT 15 (L) 06/28/2017 0834   ALKPHOS 92 06/28/2017 0834   BILITOT 1.2 06/28/2017 0834   GFRNONAA 50 (L) 06/28/2017 0834   GFRNONAA 76 06/23/2014 0914   GFRAA 58 (L) 06/28/2017 0834   GFRAA 88 06/23/2014 0914  Imaging I have reviewed the images obtained: CT-scan of the brain -no evidence of bleed or ischemic stroke, chronic white matter disease and extensive atrophy.  MRI examination of the brain -extensively motion related exam with no gross evidence of strokes.  Again demonstrated chronic white matter disease and extensive atrophy.  Assessment:  83 year old man with extensive past medical history including that of atrial fibrillation, dementia with behavioral problems, who was brought in for evaluation  of confusion, slurred speech and left facial droop, out of which the confusion is persistent but the slurred speech and left facial droop have resolved.  Patient has been admitted to the medicine service for TIA workup. He is on multiple medications that can cause him to be sedated and confused, as well as lower his seizure threshold, making him prone to have more seizures.  My differentials include: Toxic metabolic encephalopathy Behavioral problems associated with dementia Evaluate for TIA  Recommendations: Minimize sedating medications.  I do not think that he should be on 2 benzodiazepines at the same time.  I spoke in depth with the primary hospitalist Dr. Evangeline Gula, who is going to review the med list and discontinue the Lorazepam and reduce the dosage of the temazepam. Another medication that potentially needs to be reviewed with the geriatric psychiatrist is the Risperdal.  Alternatives such as Seroquel might be better given his age and comorbidities but I will leave that decision up to the primary and the psychiatrist. (Might benefit from an actual in house consultation with Psychiatry)  TIA w/u as ordered by Primary.  We will follow with you.  -- Amie Portland, MD [facility] [phone] If 7pm to 7am, please call on call as listed on AMION.

## 2017-06-29 ENCOUNTER — Observation Stay (HOSPITAL_COMMUNITY): Payer: Self-pay

## 2017-06-29 ENCOUNTER — Observation Stay (HOSPITAL_COMMUNITY): Payer: PPO

## 2017-06-29 DIAGNOSIS — R296 Repeated falls: Secondary | ICD-10-CM | POA: Diagnosis present

## 2017-06-29 DIAGNOSIS — I10 Essential (primary) hypertension: Secondary | ICD-10-CM | POA: Diagnosis present

## 2017-06-29 DIAGNOSIS — K219 Gastro-esophageal reflux disease without esophagitis: Secondary | ICD-10-CM | POA: Diagnosis present

## 2017-06-29 DIAGNOSIS — F0391 Unspecified dementia with behavioral disturbance: Secondary | ICD-10-CM | POA: Diagnosis present

## 2017-06-29 DIAGNOSIS — E876 Hypokalemia: Secondary | ICD-10-CM | POA: Diagnosis present

## 2017-06-29 DIAGNOSIS — R791 Abnormal coagulation profile: Secondary | ICD-10-CM | POA: Diagnosis present

## 2017-06-29 DIAGNOSIS — W19XXXD Unspecified fall, subsequent encounter: Secondary | ICD-10-CM | POA: Diagnosis not present

## 2017-06-29 DIAGNOSIS — Z5181 Encounter for therapeutic drug level monitoring: Secondary | ICD-10-CM | POA: Diagnosis not present

## 2017-06-29 DIAGNOSIS — Z7982 Long term (current) use of aspirin: Secondary | ICD-10-CM | POA: Diagnosis not present

## 2017-06-29 DIAGNOSIS — I359 Nonrheumatic aortic valve disorder, unspecified: Secondary | ICD-10-CM | POA: Diagnosis not present

## 2017-06-29 DIAGNOSIS — E785 Hyperlipidemia, unspecified: Secondary | ICD-10-CM | POA: Diagnosis present

## 2017-06-29 DIAGNOSIS — Z85528 Personal history of other malignant neoplasm of kidney: Secondary | ICD-10-CM | POA: Diagnosis not present

## 2017-06-29 DIAGNOSIS — I2510 Atherosclerotic heart disease of native coronary artery without angina pectoris: Secondary | ICD-10-CM | POA: Diagnosis present

## 2017-06-29 DIAGNOSIS — Z952 Presence of prosthetic heart valve: Secondary | ICD-10-CM | POA: Diagnosis not present

## 2017-06-29 DIAGNOSIS — Z951 Presence of aortocoronary bypass graft: Secondary | ICD-10-CM | POA: Diagnosis not present

## 2017-06-29 DIAGNOSIS — R4182 Altered mental status, unspecified: Secondary | ICD-10-CM | POA: Diagnosis not present

## 2017-06-29 DIAGNOSIS — Y92009 Unspecified place in unspecified non-institutional (private) residence as the place of occurrence of the external cause: Secondary | ICD-10-CM | POA: Diagnosis not present

## 2017-06-29 DIAGNOSIS — Z87891 Personal history of nicotine dependence: Secondary | ICD-10-CM | POA: Diagnosis not present

## 2017-06-29 DIAGNOSIS — Z79899 Other long term (current) drug therapy: Secondary | ICD-10-CM | POA: Diagnosis not present

## 2017-06-29 DIAGNOSIS — W19XXXA Unspecified fall, initial encounter: Secondary | ICD-10-CM | POA: Diagnosis present

## 2017-06-29 DIAGNOSIS — T45514A Poisoning by anticoagulants, undetermined, initial encounter: Secondary | ICD-10-CM | POA: Diagnosis not present

## 2017-06-29 DIAGNOSIS — N179 Acute kidney failure, unspecified: Secondary | ICD-10-CM | POA: Diagnosis present

## 2017-06-29 DIAGNOSIS — Z515 Encounter for palliative care: Secondary | ICD-10-CM | POA: Diagnosis present

## 2017-06-29 DIAGNOSIS — R451 Restlessness and agitation: Secondary | ICD-10-CM | POA: Diagnosis present

## 2017-06-29 DIAGNOSIS — G92 Toxic encephalopathy: Secondary | ICD-10-CM | POA: Diagnosis present

## 2017-06-29 DIAGNOSIS — R29810 Facial weakness: Secondary | ICD-10-CM | POA: Diagnosis present

## 2017-06-29 DIAGNOSIS — S5002XA Contusion of left elbow, initial encounter: Secondary | ICD-10-CM | POA: Diagnosis present

## 2017-06-29 DIAGNOSIS — S8002XA Contusion of left knee, initial encounter: Secondary | ICD-10-CM | POA: Diagnosis present

## 2017-06-29 DIAGNOSIS — T45515A Adverse effect of anticoagulants, initial encounter: Secondary | ICD-10-CM | POA: Diagnosis present

## 2017-06-29 DIAGNOSIS — Z7901 Long term (current) use of anticoagulants: Secondary | ICD-10-CM | POA: Diagnosis not present

## 2017-06-29 DIAGNOSIS — N400 Enlarged prostate without lower urinary tract symptoms: Secondary | ICD-10-CM | POA: Diagnosis present

## 2017-06-29 DIAGNOSIS — D6832 Hemorrhagic disorder due to extrinsic circulating anticoagulants: Secondary | ICD-10-CM | POA: Diagnosis not present

## 2017-06-29 DIAGNOSIS — I482 Chronic atrial fibrillation: Secondary | ICD-10-CM | POA: Diagnosis present

## 2017-06-29 DIAGNOSIS — R410 Disorientation, unspecified: Secondary | ICD-10-CM | POA: Diagnosis not present

## 2017-06-29 LAB — CBC
HEMATOCRIT: 35.8 % — AB (ref 39.0–52.0)
Hemoglobin: 12.1 g/dL — ABNORMAL LOW (ref 13.0–17.0)
MCH: 28.6 pg (ref 26.0–34.0)
MCHC: 33.8 g/dL (ref 30.0–36.0)
MCV: 84.6 fL (ref 78.0–100.0)
Platelets: 187 10*3/uL (ref 150–400)
RBC: 4.23 MIL/uL (ref 4.22–5.81)
RDW: 12.7 % (ref 11.5–15.5)
WBC: 5.9 10*3/uL (ref 4.0–10.5)

## 2017-06-29 LAB — LIPID PANEL
CHOL/HDL RATIO: 3.1 ratio
CHOLESTEROL: 97 mg/dL (ref 0–200)
HDL: 31 mg/dL — AB (ref >40)
LDL Cholesterol: 44 mg/dL (ref 0–99)
Triglycerides: 112 mg/dL (ref <150)
VLDL: 22 mg/dL (ref 0–40)

## 2017-06-29 LAB — BASIC METABOLIC PANEL
ANION GAP: 7 (ref 5–15)
BUN: 15 mg/dL (ref 6–20)
CALCIUM: 9.3 mg/dL (ref 8.9–10.3)
CO2: 28 mmol/L (ref 22–32)
Chloride: 103 mmol/L (ref 101–111)
Creatinine, Ser: 0.97 mg/dL (ref 0.61–1.24)
GFR calc Af Amer: >60 mL/min (ref >60)
GFR calc non Af Amer: >60 mL/min (ref >60)
GLUCOSE: 114 mg/dL — AB (ref 65–99)
Potassium: 3.3 mmol/L — ABNORMAL LOW (ref 3.5–5.1)
Sodium: 138 mmol/L (ref 135–145)

## 2017-06-29 LAB — HEMOGLOBIN A1C
Hgb A1c MFr Bld: 6 % — ABNORMAL HIGH (ref 4.8–5.6)
MEAN PLASMA GLUCOSE: 125.5 mg/dL

## 2017-06-29 LAB — PROTIME-INR
INR: 4.47
Prothrombin Time: 41.6 seconds — ABNORMAL HIGH (ref 11.4–15.2)

## 2017-06-29 MED ORDER — LORAZEPAM 2 MG/ML IJ SOLN
1.0000 mg | Freq: Once | INTRAMUSCULAR | Status: AC
  Administered 2017-06-29: 1 mg via INTRAVENOUS
  Filled 2017-06-29: qty 1

## 2017-06-29 MED ORDER — PHYTONADIONE 5 MG PO TABS
2.5000 mg | ORAL_TABLET | Freq: Once | ORAL | Status: AC
  Administered 2017-06-29: 2.5 mg via ORAL
  Filled 2017-06-29: qty 1

## 2017-06-29 MED ORDER — LORAZEPAM 2 MG/ML IJ SOLN: 1.0000 mg | INTRAMUSCULAR | Status: DC | PRN

## 2017-06-29 MED ORDER — POTASSIUM CHLORIDE ER 8 MEQ PO TBCR
8.0000 meq | EXTENDED_RELEASE_TABLET | Freq: Every day | ORAL | Status: DC
  Administered 2017-06-29 – 2017-07-02 (×4): 8 meq via ORAL
  Filled 2017-06-29 (×4): qty 1

## 2017-06-29 NOTE — Progress Notes (Signed)
Neurology Progress Note   S:// Patient seen and examined.  He slept from 11 PM to 7 AM according to family. Continues to be mildly agitated.  O:// Current vital signs: BP 125/62   Pulse 70   Temp 98.2 F (36.8 C) (Axillary)   Resp 18   Wt 103.4 kg (228 lb)   SpO2 99%   BMI 27.75 kg/m  Vital signs in last 24 hours: Temp:  [97.5 F (36.4 C)-98.2 F (36.8 C)] 98.2 F (36.8 C) (11/03 0522) Pulse Rate:  [70-89] 70 (11/03 0833) Resp:  [15-24] 18 (11/03 0522) BP: (106-146)/(59-90) 125/62 (11/03 0833) SpO2:  [97 %-99 %] 99 % (11/03 0522) Weight:  [103.4 kg (228 lb)] 103.4 kg (228 lb) (11/03 0522) Not much changed neuro exam from yesterday. General: Awake alert, no acute distress appears a little restless HEENT: Normocephalic, atraumatic, moist oral mucous membranes Lungs: Clear to auscultation CVS: S1-S2 heard, regular rate Abdomen: Soft nondistended Extremities: Big bruise on the left elbow, otherwise unremarkable Neurological exam Mental status: Awake, alert, follows simple commands, able to tell his name but got his date of birth wrong. Could not tell me the current date. Poor attention concentration. Speech and language: Speech is mildly dysarthric.  Naming comprehension and repetition impaired. Cranial nerves: Pupils equal round reactive light, extra ocular movements intact, visual fields full to threat, facial sensation intact, face symmetric, auditory acuity reduced bilaterally, palate elevates at midline, shoulder shrug intact bilaterally, tongue midline. Motor exam: Nearly symmetric 5/5 strength in the upper extremities with no drift.  Nearly symmetric 4+/5 extremities strength in the legs bilaterally.  Normal tone.  Normal range of motion. Sensory exam: Intact to light touch all over Coordination: Intact finger-nose-finger although it takes a while for him to perform, I do not think this is bradykinesia though Gait was not tested for patient  safety  Medications  Current Facility-Administered Medications:  .  0.9 % NaCl with KCl 20 mEq/ L  infusion, , Intravenous, Continuous, Evangeline Gula, Wyatt Haste, MD, Last Rate: 75 mL/hr at 06/29/17 0510 .  acetaminophen (TYLENOL) tablet 650 mg, 650 mg, Oral, Q6H PRN **OR** acetaminophen (TYLENOL) suppository 650 mg, 650 mg, Rectal, Q6H PRN, Lady Deutscher, MD .  amLODipine (NORVASC) tablet 10 mg, 10 mg, Oral, Daily, Geiple, Joshua, PA-C, 10 mg at 06/29/17 0834 .  aspirin EC tablet 81 mg, 81 mg, Oral, Daily, Lady Deutscher, MD, 81 mg at 06/29/17 8242 .  atorvastatin (LIPITOR) tablet 40 mg, 40 mg, Oral, q1800, Carlisle Cater, PA-C, 40 mg at 06/28/17 1810 .  carvedilol (COREG) tablet 6.25 mg, 6.25 mg, Oral, BID WC, Geiple, Joshua, PA-C, 6.25 mg at 06/29/17 3536 .  cholecalciferol (VITAMIN D) tablet 1,000 Units, 1,000 Units, Oral, Daily, Carlisle Cater, PA-C, 1,000 Units at 06/29/17 0834 .  digoxin (LANOXIN) tablet 0.125 mg, 0.125 mg, Oral, Daily, Geiple, Joshua, PA-C, 0.125 mg at 06/29/17 0834 .  escitalopram (LEXAPRO) tablet 10 mg, 10 mg, Oral, Daily, Geiple, Joshua, PA-C, 10 mg at 06/29/17 0834 .  ketorolac (TORADOL) 15 MG/ML injection 15 mg, 15 mg, Intravenous, Q6H PRN, Lady Deutscher, MD, 15 mg at 06/28/17 1442 .  nitroGLYCERIN (NITROSTAT) SL tablet 0.4 mg, 0.4 mg, Sublingual, Q5 min PRN, Geiple, Joshua, PA-C .  ondansetron (ZOFRAN) tablet 4 mg, 4 mg, Oral, Q6H PRN **OR** ondansetron (ZOFRAN) injection 4 mg, 4 mg, Intravenous, Q6H PRN, Lady Deutscher, MD .  potassium chloride (KLOR-CON) CR tablet 8 mEq, 8 mEq, Oral, Daily, Evangeline Gula, Wyatt Haste, MD, 8 mEq  at 06/29/17 0833 .  ramipril (ALTACE) capsule 10 mg, 10 mg, Oral, BID, Carlisle Cater, PA-C, 10 mg at 06/29/17 8315 .  risperiDONE (RISPERDAL) tablet 0.5 mg, 0.5 mg, Oral, TID, Geiple, Joshua, PA-C, 0.5 mg at 06/29/17 0833 .  sodium chloride flush (NS) 0.9 % injection 3 mL, 3 mL, Intravenous, Q12H, Evangeline Gula, Wyatt Haste, MD .  temazepam  (RESTORIL) capsule 15 mg, 15 mg, Oral, QHS PRN, Lady Deutscher, MD .  torsemide [facility]) tablet 10 mg, 10 mg, Oral, BID, Geiple, Joshua, PA-C, 10 mg at 06/29/17 0834 .  traZODone (DESYREL) tablet 50 mg, 50 mg, Oral, QHS, Geiple, Joshua, PA-C, 50 mg at 06/28/17 2108 .  vitamin B-12 (CYANOCOBALAMIN) tablet 1,000 mcg, 1,000 mcg, Oral, Daily, Carlisle Cater, PA-C, 1,000 mcg at 06/29/17 0833  Labs CBC    Component Value Date/Time   WBC 5.9 06/29/2017 0834   RBC 4.23 06/29/2017 0834   HGB 12.1 (L) 06/29/2017 0834   HCT 35.8 (L) 06/29/2017 0834   PLT 187 06/29/2017 0834   MCV 84.6 06/29/2017 0834   MCH 28.6 06/29/2017 0834   MCHC 33.8 06/29/2017 0834   RDW 12.7 06/29/2017 0834   LYMPHSABS 1.8 06/28/2017 0834   MONOABS 0.4 06/28/2017 0834   EOSABS 0.3 06/28/2017 0834   BASOSABS 0.0 06/28/2017 0834   CMP     Component Value Date/Time   NA 138 06/29/2017 0834   K 3.3 (L) 06/29/2017 0834   CL 103 06/29/2017 0834   CO2 28 06/29/2017 0834   GLUCOSE 114 (H) 06/29/2017 0834   BUN 15 06/29/2017 0834   CREATININE 0.97 06/29/2017 0834   CREATININE 0.95 11/07/2015 1006   CALCIUM 9.3 06/29/2017 0834   PROT 6.3 (L) 06/28/2017 0834   ALBUMIN 3.5 06/28/2017 0834   AST 18 06/28/2017 0834   ALT 15 (L) 06/28/2017 0834   ALKPHOS 92 06/28/2017 0834   BILITOT 1.2 06/28/2017 0834   GFRNONAA >60 06/29/2017 0834   GFRNONAA 76 06/23/2014 0914   GFRAA >60 06/29/2017 0834   GFRAA 88 06/23/2014 0914   A1c 6.0 LDL 44  Imaging I have reviewed the images obtained: CT-scan of the brain -no evidence of bleed or ischemic stroke, chronic white matter disease and extensive atrophy.  MRI examination of the brain -extensively motion related exam with no gross evidence of strokes.  Again demonstrated chronic white matter disease and extensive atrophy.  Assessment:  83/M with PMH of dementia with behavioral issues, atrial fibrillation, was brought in for slurred speech and facial droop along with  confusion.  The facial droop and slurred speech of resolved but he still seems to be agitated and confused. It is my impression that the underlying cause of his clinical presentation is polypharmacy. MRI brain is negative for stroke  Impression: Toxic metabolic encephalopathy Dementia with behavioral problems Evaluation for TIA  Recommendations: Optimizing home medications - especially Risperidal/Temazepam/lorazepam/Trazodone Consider psychiatry consult in house. Follow-up with geriatric psychiatry as an outpatient. Complete TIA workup We will be available as needed. Please call with questions  Amie Portland, MD [facility] [phone] If 7pm to 7am, please call on call as listed on AMION.

## 2017-06-29 NOTE — Progress Notes (Signed)
Patient was very agitated and not following command. Patient cannot assess for [employer] this shift.

## 2017-06-29 NOTE — Progress Notes (Signed)
PT Cancellation Note  Patient Details Name: QUINTAVIUS NIEBUHR MRN: [id number] DOB: [birth year]   Cancelled Treatment:     I tried to see pt several times but pt sleeping and then getting cleaned up.  I had long talk with pts daughter.  I have seen pt and his wife for [facility] several episodes in the past.   I am very familiar with pt and family and set up.  Daughter is wanting to take pt home.  I feel hospice would be good recommendation at discharge.  Family has hired private help and can increase hours.  They are planning to talk to lawyer next week about ideas of best way to manage finances at this stage in pts life.  I plan to see pt tomorrow for PT eval - he hasnt been up walking for almost 2 weeks per daughter. He has had about 20 falls in last 3 months - mostly pt just melting to the floor.  I feel pt would benefit from hoyer at home - to help with getting pt up if weak and to help get pt off floor if he happens to get on the floor.   Loyal Buba 06/29/2017, 3:16 PM

## 2017-06-29 NOTE — Consult Note (Addendum)
Consultation Note Date: 06/29/2017   Patient Name: [name]  DOB: 19-May-1934  MRN: [id number]  Age / Sex: 83 y.o., male  PCP: Cari Caraway, MD Referring Physician: Kayleen Memos, DO  Reason for Consultation: Establishing goals of care, Hospice Evaluation and Psychosocial/spiritual support  HPI/Patient Profile: 83 y.o. male  with past medical history of CAD with h/o CABG, end stage dementia, aortic valve replacement on AC, HLD, HTN admitted on 06/28/2017 with new right facila droop, slurred speech which has now resolvd   Clinical Assessment and Goals of Care: Pt was resting when I entered the room. Daughter [name] and son [name] at the bedside. While asleep pt was calm but then awakened and began to move in the bed.( bed however was wet). Lower lip tremor noted. Per chart review, ? TIA. CT no CVA, CXR clear as is UA  Pt is married but his daughter, [name], and son, [name], are primary healthcare proxies. They are making decisions as a familu    SUMMARY OF RECOMMENDATIONS   DNR/DNI No artificial feeding Home with hospice when medically maximized. Consult placed to care management Need for hospice to meet family at the hospital prior to DC, or at the home at the day of discharge Code Status/Advance Care Planning:  DNR    Symptom Management:  Agitation: Pt is on Risperdal 0.5 TID. Neurology note reviewed; recommending changes such as Seroquel. Family is reluctant to let go of Risperdal because of the severity of agitation prior to initiation. He is exhibiting psychomotor restlessness, ? akathisia as well as lower lip tremor which is new. Family describes Risperdal as calming to pt Family would consider changes to meds but have had a very difficult time getting pt to appts and getting out pt appts with neurology as well as gero-psych.   Palliative Prophylaxis:   Aspiration, Bowel Regimen, Delirium  Protocol, Frequent Pain Assessment, Oral Care and Turn Reposition  Additional Recommendations (Limitations, Scope, Preferences):  Avoid Hospitalization  Psycho-social/Spiritual:   Desire for further Chaplaincy support:no  Additional Recommendations: Referral to Community Resources   Prognosis:   < 6 months in the setting of advancing dementia as evidenced by decreased PO intake ( only eating bites and sips of ice cream and sweet tea), falls, inability to walk consistently, severe agitation. Family has seen a very sharp decline since Dec of last year with hospitalizations for acute delirium.  Goals are in line with comfort and dignity with recognition that patient is nearing  end of life  Discharge Planning: Home with Hospice      Primary Diagnoses: Present on Admission: . Delirium . Aortic valve disorder . Atrial fibrillation ([place]) . Fall . Dementia with behavioral disturbance . HYPOKALEMIA . Warfarin-induced coagulopathy ([place])   I have reviewed the medical record, interviewed the patient and family, and examined the patient. The following aspects are pertinent.  Past Medical History:  Diagnosis Date  . Aortic valve replaced   . Arthritis   . Atrial fibrillation ([place])    longterm persistent; anticoagulated with  warfarin  . Back pain, chronic    SINCE BACK INJURY / FRACTURE  . BPH (benign prostatic hyperplasia)    grapey  . CAD (coronary artery disease)   . Cancer (HCC)    RIGHT RENAL  . Constipation   . Cough 06/16/12   C/O OF COUGH / COLD FOR A COUPLE OF WEEKS  . Dementia   . Diverticulitis    history  . Elevated fasting glucose 2012  . GERD (gastroesophageal reflux disease)   . HTN (hypertension)   . Hyperlipidemia   . Hypokalemia   . Incontinence   . Kidney disorder    ablation defect in the right kidney lower pole but progression of interpolar lesion: stable on repeat CT (06/2014)  . Right renal mass    renal cell carcinoma  . Shortness of breath     PT STATES RELATED TO HIS AF AND HEART BEING OUT OF RHYTHM  . Stomach ulcer    Social History   Social History  . Marital status: Married    Spouse name: N/A  . Number of children: 2  . Years of education: N/A   Occupational History  .      retired   Social History Main Topics  . Smoking status: Former Smoker    Packs/day: 2.00    Years: 20.00    Types: Cigarettes    Quit date: 08/27/1977  . Smokeless tobacco: Never Used  . Alcohol use No  . Drug use: No  . Sexual activity: Not Currently   Other Topics Concern  . None   Social History Narrative   Lives with his wife, good family support, ambulates frequently bumps into things and has a history of falls   Has a caretaker 5 hours a day for 5 days a week.   Caffeine 2 cups daily.    Family History  Problem Relation Age of Onset  . Hypertension Mother   . Diabetes Mother   . Hypertension Sister   . Diabetes Sister   . Hypertension Brother   . Diabetes Brother    Scheduled Meds: . amLODipine  10 mg Oral Daily  . aspirin EC  81 mg Oral Daily  . atorvastatin  40 mg Oral q1800  . carvedilol  6.25 mg Oral BID WC  . cholecalciferol  1,000 Units Oral Daily  . digoxin  0.125 mg Oral Daily  . escitalopram  10 mg Oral Daily  . potassium chloride  8 mEq Oral Daily  . ramipril  10 mg Oral BID  . risperiDONE  0.5 mg Oral TID  . sodium chloride flush  3 mL Intravenous Q12H  . torsemide  10 mg Oral BID  . traZODone  50 mg Oral QHS  . vitamin B-12  1,000 mcg Oral Daily   Continuous Infusions: . 0.9 % NaCl with KCl 20 mEq / L 75 mL/hr at 06/29/17 0510   PRN Meds:.acetaminophen **OR** acetaminophen, ketorolac, nitroGLYCERIN, ondansetron **OR** ondansetron (ZOFRAN) IV, temazepam Medications Prior to Admission:  Prior to Admission medications   Medication Sig Start Date End Date Taking? Authorizing Provider  amLODipine (NORVASC) 10 MG tablet Take 1 tablet (10 mg total) by mouth daily. 03/10/17 03/10/18 Yes Domenic Polite, MD    aspirin EC 81 MG EC tablet Take 1 tablet (81 mg total) by mouth daily. 09/11/13  Yes Short, Noah Delaine, MD  atorvastatin (LIPITOR) 40 MG tablet Take 40 mg by mouth daily. 06/14/15  Yes [provider]  carvedilol (COREG) 6.25 MG tablet Take 6.25  mg by mouth 2 (two) times daily with a meal.   Yes [provider]  cholecalciferol (VITAMIN D) 1000 units tablet Take 1,000 Units by mouth daily.   Yes [provider]  [place] 125 MCG tablet TAKE 1 TABLET(125 MCG) BY MOUTH DAILY 05/20/17  Yes Josue Hector, MD  escitalopram (LEXAPRO) 10 MG tablet Take 10 mg by mouth daily.   Yes [provider]  LORazepam (ATIVAN) 1 MG tablet Take 1 tablet (1 mg total) by mouth at bedtime. 06/05/17 06/05/18 Yes Plovsky, Berneta Sages, MD  potassium chloride (KLOR-CON) 8 MEQ tablet TAKE 1 TABLET(8 MEQ) BY MOUTH DAILY 02/05/17  Yes Josue Hector, MD  ramipril (ALTACE) 10 MG capsule Take 10 mg by mouth 2 (two) times daily.   Yes [provider]  risperiDONE (RISPERDAL) 0.5 MG tablet Take 1 tablet (0.5 mg total) by mouth 3 (three) times daily. 01/31/17  Yes Star Age, MD  temazepam (RESTORIL) 15 MG capsule Take 1 capsule (15 mg total) by mouth at bedtime as needed for sleep. Patient taking differently: Take 30 mg by mouth at bedtime as needed for sleep.  05/01/17  Yes Plovsky, Berneta Sages, MD  torsemide (DEMADEX) 10 MG tablet TAKE 1 TABLET BY MOUTH TWICE DAILY 05/21/17  Yes Josue Hector, MD  vitamin B-12 (CYANOCOBALAMIN) 1000 MCG tablet Take 1,000 mcg by mouth daily.     Yes [provider]  warfarin (COUMADIN) 4 MG tablet Take 2 tablets (8 mg total) by mouth at bedtime. Continue home dose of 8mg , Needs INR check in 4-5days Patient taking differently: Take 4-8 mg by mouth at bedtime. 8 mg on Tuesday, Wednesday, Thursday, Saturday and Sunday. All other days are 4 mg 03/10/17  Yes Joseph, Preetha, MD  ferrous sulfate 325 (65 FE) MG EC tablet Take 325 mg by mouth daily with breakfast.     [provider]  nitroGLYCERIN (NITROSTAT) 0.4 MG SL tablet Place 1 tablet (0.4 mg total) under the tongue every 5 (five) minutes as needed. For chest pain 10/20/15   Nishan, Peter C, MD  traZODone (DESYREL) 50 MG tablet Take 1 tablet (50 mg total) by mouth at bedtime. May take 2 qhs if needed 06/26/17   Plovsky, Gerald, MD   No Known Allergies Review of Systems  Unable to perform ROS: Dementia    Physical Exam  Constitutional: He appears well-developed and well-nourished.  HENT:  Head: Normocephalic and atraumatic.  Neck: Normal range of motion.  Pulmonary/Chest: Effort normal.  Abdominal: Soft.  Neurological:  Lower lip tremor ? Tardive dyskinesia Psychomotor restlessness; ? akathesia  Skin: Skin is warm and dry. There is pallor.  Psychiatric:  Psychomotor restlessness Confused paranoid  Nursing note and vitals reviewed.   Vital Signs: BP 125/62   Pulse 70   Temp 98.2 F (36.8 C) (Axillary)   Resp 18   Wt 103.4 kg (228 lb)   SpO2 99%   BMI 27.75 kg/m  Pain Assessment: No/denies pain   Pain Score: 0-No pain   SpO2: SpO2: 99 % O2 Device:SpO2: 99 % O2 Flow Rate: .   IO: Intake/output summary:  Intake/Output Summary (Last 24 hours) at 06/29/17 1036 Last data filed at 06/29/17 0535  Gross per 24 hour  Intake          1421.25 ml  Output              400 ml  Net          10 21.25 ml    LBM:  Last BM Date:  (PTA) Baseline Weight: Weight: 103.4 kg (228 lb) Most recent weight: Weight: 103.4 kg (228 lb)     Palliative Assessment/Data:   Flowsheet Rows     Most Recent Value  Intake Tab  Referral Department  Hospitalist  Unit at Time of Referral  Med/Surg Unit  Palliative Care Primary Diagnosis  Neurology  Date Notified  06/28/17  Palliative Care Type  New Palliative care  Reason for referral  Clarify Goals of Care, Non-pain Symptom  Clinical Assessment  Palliative Performance Scale Score  40%  Pain Max last 24 hours  Not able to report  Pain Min  Last 24 hours  Not able to report  Dyspnea Max Last 24 Hours  Not able to report  Dyspnea Min Last 24 hours  Not able to report  Nausea Max Last 24 Hours  Not able to report  Nausea Min Last 24 Hours  Not able to report  Anxiety Max Last 24 Hours  Not able to report  Anxiety Min Last 24 Hours  Not able to report  Other Max Last 24 Hours  Not able to report  Psychosocial & Spiritual Assessment  Palliative Care Outcomes  Patient/Family meeting held?  Yes  Who was at the meeting?  dtr and son  Palliative Care Outcomes  Clarified goals of care  Patient/Family wishes: Interventions discontinued/not started   Mechanical Ventilation, Trach, PEG      Time In: 0900 Time Out: 1020 Time Total: 80 min Greater than 50%  of this time was spent counseling and coordinating care related to the above assessment and plan.  Signed by: Dory Horn, NP   Please contact Palliative Medicine Team phone at [phone] for questions and concerns.  For individual provider: See Shea Evans

## 2017-06-29 NOTE — Progress Notes (Addendum)
PROGRESS NOTE  [name]:[id number] DOB: 1934-06-29 DOA: 06/28/2017 PCP: Cari Caraway, MD  HPI/Recap of past 24 hours: Pt is in the room accompanied by his daughter. He is confused and not able to provide a review of systems. supratherapeutic INR. He is mildly agitated but does not appear to be in distress.  Assessment/Plan: Principal Problem:   Delirium Active Problems:   HYPOKALEMIA   Atrial fibrillation (HCC)   H/O mechanical aortic valve replacement   Aortic valve disorder   Dementia with behavioral disturbance   Fall   AKI (acute kidney injury) ([place])   Warfarin poisoning   Warfarin-induced coagulopathy ([place])   Code Status: DNR  Family Communication: Daughter at bedside  Disposition Plan: Stay another midnight to correct supratherapeutic INR and improve symptoms.   Consultants:  None  Procedures:  None  Antimicrobials:  None  DVT prophylaxis:  supratherapeutic INR on coumadin   Objective: Vitals:   06/28/17 2101 06/28/17 2106 06/29/17 0522 06/29/17 0833  BP: (!) 146/72 (!) 146/72 106/81 125/62  Pulse:  81 85   Resp:  18 18   Temp:  (!) 97.5 F (36.4 C) 98.2 F (36.8 C)   TempSrc:  Oral Axillary   SpO2:  99% 99%   Weight:   103.4 kg (228 lb)     Intake/Output Summary (Last 24 hours) at 06/29/17 0834 Last data filed at 06/29/17 0535  Gross per 24 hour  Intake          1421.25 ml  Output              400 ml  Net          1021.25 ml   Filed Weights   06/29/17 0522  Weight: 103.4 kg (228 lb)    Exam:  Physical Exam  Constitutional: He appears mildly agitated  HENT: Normocephalic and atraumatic.  Mouth/Throat: No oropharyngeal exudate.  Eyes: Pupils are equal, round, and reactive to light. Conjunctivae and EOM are normal. No scleral icterus.  Neck: Normal range of motion. Neck supple. No JVD present. No thyromegaly present.  Cardiovascular: Irregularly irregular rhythm present. no friction rub. No murmur heard. Pulmonary:  Effort normal and breath sounds normal. No wheezes Abdominal: Soft. Bowel sounds are normal. He exhibits no distension. There is no tenderness.  Musculoskeletal: Normal range of motion. He exhibits no edema or deformity.  Neurological: He has normal strength. Skin: Skin is warm and dry.  Psychiatric: He is mildly agitated.  Data Reviewed: CBC:  Recent Labs Lab 06/28/17 0834  WBC 6.3  NEUTROABS 3.7  HGB 12.1*  HCT 36.4*  MCV 86.7  PLT 094   Basic Metabolic Panel:  Recent Labs Lab 06/28/17 0834  NA 143  K 3.3*  CL 106  CO2 31  GLUCOSE 118*  BUN 20  CREATININE 1.29*  CALCIUM 9.6   GFR: Estimated Creatinine Clearance: 55.1 mL/min (A) (by C-G formula based on SCr of 1.29 mg/dL (H)). Liver Function Tests:  Recent Labs Lab 06/28/17 0834  AST 18  ALT 15*  ALKPHOS 92  BILITOT 1.2  PROT 6.3*  ALBUMIN 3.5   No results for input(s): LIPASE, AMYLASE in the last 168 hours. No results for input(s): AMMONIA in the last 168 hours. Coagulation Profile:  Recent Labs Lab 06/28/17 0834  INR 4.48*   Cardiac Enzymes: No results for input(s): CKTOTAL, CKMB, CKMBINDEX, TROPONINI in the last 168 hours. BNP (last 3 results) No results for input(s): PROBNP in the last 8760 hours. HbA1C: No  results for input(s): HGBA1C in the last 72 hours. CBG:  Recent Labs Lab 06/28/17 0851  GLUCAP 101*   Lipid Profile: No results for input(s): CHOL, HDL, LDLCALC, TRIG, CHOLHDL, LDLDIRECT in the last 72 hours. Thyroid Function Tests: No results for input(s): TSH, T4TOTAL, FREET4, T3FREE, THYROIDAB in the last 72 hours. Anemia Panel: No results for input(s): VITAMINB12, FOLATE, FERRITIN, TIBC, IRON, RETICCTPCT in the last 72 hours. Urine analysis:    Component Value Date/Time   COLORURINE YELLOW 06/28/2017 1412   APPEARANCEUR CLEAR 06/28/2017 1412   LABSPEC 1.019 06/28/2017 1412   PHURINE 5.0 06/28/2017 1412   GLUCOSEU NEGATIVE 06/28/2017 1412   HGBUR NEGATIVE 06/28/2017 1412    BILIRUBINUR NEGATIVE 06/28/2017 1412   KETONESUR NEGATIVE 06/28/2017 1412   PROTEINUR NEGATIVE 06/28/2017 1412   UROBILINOGEN 0.2 09/01/2013 1301   NITRITE NEGATIVE 06/28/2017 1412   LEUKOCYTESUR NEGATIVE 06/28/2017 1412   Sepsis Labs: @LABRCNTIP (procalcitonin:4,lacticidven:4)  )No results found for this or any previous visit (from the past 240 hour(s)).    Studies: Dg Chest 1 View  Result Date: 06/28/2017 CLINICAL DATA:  83 year old male with altered mental status. EXAM: CHEST 1 VIEW COMPARISON:  03/11/2017 FINDINGS: The cardiomediastinal silhouette is enlarged but unchanged in size and configuration. Median sternotomy wires noted. There is minimal bibasilar atelectasis without focal opacity, sizable pleural effusion or pneumothorax. No acute osseous abnormalities. IMPRESSION: Stable cardiomegaly without acute intrathoracic process. Electronically Signed   By: Kristopher Oppenheim M.D.   On: 06/28/2017 09:10   Dg Elbow Complete Left  Result Date: 06/28/2017 CLINICAL DATA:  Acute left elbow pain following fall 2 days ago. Initial encounter. EXAM: LEFT ELBOW - COMPLETE 3+ VIEW COMPARISON:  None. FINDINGS: No acute fracture, subluxation or dislocation identified. There is no evidence of joint effusion. Posterior and medial soft tissue swelling noted. IMPRESSION: Soft tissue swelling without acute bony abnormality. Electronically Signed   By: Margarette Canada M.D.   On: 06/28/2017 10:28   Ct Head Wo Contrast  Result Date: 06/28/2017 CLINICAL DATA:  Altered level of consciousness EXAM: CT HEAD WITHOUT CONTRAST TECHNIQUE: Contiguous axial images were obtained from the base of the skull through the vertex without intravenous contrast. COMPARISON:  MRI 10/12/2016 FINDINGS: Brain: There is atrophy and chronic small vessel disease changes. No acute intracranial abnormality. Specifically, no hemorrhage, hydrocephalus, mass lesion, acute infarction, or significant intracranial injury. Vascular: No hyperdense  vessel or unexpected calcification. Skull: No acute calvarial abnormality. Sinuses/Orbits: Visualized paranasal sinuses and mastoids clear. Orbital soft tissues unremarkable. Other: None IMPRESSION: No acute intracranial abnormality. Atrophy, chronic microvascular disease. Electronically Signed   By: Rolm Baptise M.D.   On: 06/28/2017 09:24   Mr Brain Wo Contrast  Result Date: 06/28/2017 CLINICAL DATA:  Altered level of consciousness.  Fall 2 days ago. EXAM: MRI HEAD WITHOUT CONTRAST TECHNIQUE: Multiplanar, multiecho pulse sequences of the brain and surrounding structures were obtained without intravenous contrast. COMPARISON:  10/12/2016. FINDINGS: Brain: No acute infarction or visible hemorrhage, hydrocephalus, extra-axial collection or mass lesion. Prominent cerebral atrophy with ventriculomegaly. Medial temporal volume loss is moderate. Mild to moderate for age periventricular chronic microvascular ischemic change. Vascular: Loss of flow void in the left sigmoid sinus is likely related to asymmetric slower flow. All images combined no dural venous sinus thrombosis suspected. Major arterial flow voids are preserved. Skull and upper cervical spine: No gross marrow lesion. Sinuses/Orbits: Bilateral cataract resection.  No acute finding. IMPRESSION: 1. No acute finding, including infarct. 2. Atrophy and chronic small vessel ischemia. 3. Motion degraded  to the degree that pathology could be easily obscured. Electronically Signed   By: Monte Fantasia M.D.   On: 06/28/2017 12:54    Scheduled Meds: . amLODipine  10 mg Oral Daily  . aspirin EC  81 mg Oral Daily  . atorvastatin  40 mg Oral q1800  . carvedilol  6.25 mg Oral BID WC  . cholecalciferol  1,000 Units Oral Daily  . digoxin  0.125 mg Oral Daily  . escitalopram  10 mg Oral Daily  . potassium chloride  8 mEq Oral Daily  . ramipril  10 mg Oral BID  . risperiDONE  0.5 mg Oral TID  . sodium chloride flush  3 mL Intravenous Q12H  . torsemide  10 mg  Oral BID  . traZODone  50 mg Oral QHS  . vitamin B-12  1,000 mcg Oral Daily    Continuous Infusions: . 0.9 % NaCl with KCl 20 mEq / L 75 mL/hr at 06/29/17 0510     LOS: 0 days   1.  Altered mental status in the setting of advanced dementia -U/A negative - No sign of active infective process - reorient  2.  supratherapeutic INR on coumadin -INR is 4.48.   -2.5 vitamin K -hold coumadin -repeat INR in the am  3.  Hypokalemia:  - Potassium 3.3  - replete with oral and IV potassium in his fluids.  - recheck in a.m.  4.  Permanent atrial fibrillation: - well controlled on his dig and Coreg. - hold coumadin due to supratherapeutic INR  5.  Dementia with behavioral disturbance: - ativan prn - check Qtc prior to haldol  6.  Fall at home:  - fall precaution - PT when more stable  7.  Acute kidney injury:  - hydrate patient and monitor closely. - avoid nephrotoxic meds - BMP in am  8. History of mechanical aortic valve replacement: Continue warfarin.  Kayleen Memos, MD [facility] Pager [phone]  If 7PM-7AM, please contact night-coverage [email or web address] Password [facility] 06/29/2017, 8:34 AM

## 2017-06-29 NOTE — Progress Notes (Signed)
OT Cancellation Note  Patient Details Name: [name] MRN: [id number] DOB: 1934/08/03   Cancelled Treatment:    Reason Eval/Treat Not Completed: Patient declined, no reason specified. OT has attempted to see Pt x2 this morning. Pt has just fallen asleep, and due to agitation, OT will hold off and see later as schedule allows. Daughter in room.  [place] 06/29/2017, 11:38 AM  Hulda Humphrey OTR/L [phone]

## 2017-06-29 NOTE — Progress Notes (Signed)
CRITICAL VALUE ALERT  Critical Value:  INR 4.47  Date & Time Notied:  06/29/17 @ 3462  Provider Notified: MD Nevada Crane  Orders Received/Actions taken: Vitamin K ordered.

## 2017-06-30 LAB — BASIC METABOLIC PANEL
Anion gap: 8 (ref 5–15)
BUN: 13 mg/dL (ref 6–20)
CALCIUM: 9.5 mg/dL (ref 8.9–10.3)
CO2: 26 mmol/L (ref 22–32)
CREATININE: 0.95 mg/dL (ref 0.61–1.24)
Chloride: 105 mmol/L (ref 101–111)
Glucose, Bld: 165 mg/dL — ABNORMAL HIGH (ref 65–99)
Potassium: 3.5 mmol/L (ref 3.5–5.1)
SODIUM: 139 mmol/L (ref 135–145)

## 2017-06-30 LAB — CBC
HCT: 34.3 % — ABNORMAL LOW (ref 39.0–52.0)
Hemoglobin: 11.9 g/dL — ABNORMAL LOW (ref 13.0–17.0)
MCH: 29 pg (ref 26.0–34.0)
MCHC: 34.7 g/dL (ref 30.0–36.0)
MCV: 83.5 fL (ref 78.0–100.0)
PLATELETS: 193 10*3/uL (ref 150–400)
RBC: 4.11 MIL/uL — ABNORMAL LOW (ref 4.22–5.81)
RDW: 12.8 % (ref 11.5–15.5)
WBC: 6.2 10*3/uL (ref 4.0–10.5)

## 2017-06-30 LAB — PROTIME-INR
INR: 1.58
PROTHROMBIN TIME: 18.7 s — AB (ref 11.4–15.2)

## 2017-06-30 MED ORDER — LORAZEPAM 2 MG/ML IJ SOLN
0.5000 mg | Freq: Once | INTRAMUSCULAR | Status: AC
  Administered 2017-06-30: 0.5 mg via INTRAVENOUS
  Filled 2017-06-30: qty 1

## 2017-06-30 MED ORDER — WARFARIN SODIUM 2.5 MG PO TABS: 2.5000 mg | ORAL_TABLET | Freq: Every day | ORAL | Status: DC

## 2017-06-30 MED ORDER — WARFARIN - PHYSICIAN DOSING INPATIENT
Freq: Every day | Status: DC
Start: 2017-07-01 — End: 2017-07-01

## 2017-06-30 MED ORDER — WARFARIN SODIUM 4 MG PO TABS
4.0000 mg | ORAL_TABLET | Freq: Every day | ORAL | Status: DC
  Administered 2017-06-30: 4 mg via ORAL
  Filled 2017-06-30: qty 1

## 2017-06-30 NOTE — Evaluation (Signed)
Physical Therapy Evaluation Patient Details Name: [name] MRN: [id number] DOB: 10-17-1933 Today's Date: 06/30/2017   History of Present Illness  [name] is a 83 y.o. male 83 year old with a past medical history of dementia with behavioral problems, aortic valve replacement on Coumadin, atrial fibrillation, coronary artery disease, hypertension, hyperlipidemia, hypokalemia, history of falls, was brought to the hospital by his daughters for evaluation of slurred speech, confusion and left-sided facial droop.  Clinical Impression  I have talked in length with pts son and daughter.  I have worked with pt and his wife several times in [facility] setting.  Pt has not been walking for 2-3 weeks.  Family has been caring for him bedbound with family and paid caregivers.  pts daughter and son are tired but want to continue the same plan for pt and his wifes sake.  They are interested in having hospice services and getting consistent help to help them navigate going forward.  I agree with the plan - I would like hospice to allow several PT sessions to help family and aide with safe transfering etc.  If pt could get more mobile - it would greatly help with his aggitation - pt was so calm and peaceful after PT and OT walked him down the hall with mod to max assist x 2.  All questions answered with daughter and son.  PT will follow pt in the hospital to help him get as strong as possible prior to DC - although I anticipate DC to be soon.    Follow Up Recommendations Other (comment)(recommending pt home with hospice - this would make him not eligible for [facility] services.  pt would benefit from PT services if allowed by Hospice)    Equipment Recommendations  Harrel Lemon lift through [facility] )    Recommendations for Other Services       Precautions / Restrictions Precautions Precautions: Fall Precaution Comments: Daughter reports pt has had approximately 20 falls in last 3 months - mostly are 'melts' where pt  sinks to the floor.  today educated family on how to keep the chair right behind pt when walking for pt safety Restrictions Weight Bearing Restrictions: No      Mobility  Bed Mobility Overal bed mobility: Needs Assistance Bed Mobility: Supine to Sit     Supine to sit: Mod assist;Min assist     General bed mobility comments: pt needs max cues for getting up side of bed - did well - impulsive and had to remind him several times to wait on therapists to be ready  Transfers Overall transfer level: Needs assistance Equipment used: Rolling walker (2 wheeled) Transfers: Sit to/from Stand Sit to Stand: +2 physical assistance         General transfer comment: pt needed max cues and mod assist x 2 to stand from higher bed.  pt given frequent cues to stand tall - he stays in flexed position  Ambulation/Gait Ambulation/Gait assistance: +2 physical assistance Ambulation Distance (Feet): 90 Feet Assistive device: Rolling walker (2 wheeled) Gait Pattern/deviations: Trunk flexed;Decreased step length - right;Decreased step length - left;Shuffle;Wide base of support     General Gait Details: Pt hasnt walked in 2-3 weeks.  pt needed max cues to stand tall but still remained flexed and leaning on RW.  pt needed max assist x 2 to manage the RW and try to stay inside the RW.  pt did advance his feet independently.  We kept chair right behind him for safety.  pts legs  started buckling at 90 feet - chaired pulled up and pt cued to reach back to sit.    Stairs            Wheelchair Mobility    Modified Rankin (Stroke Patients Only)       Balance Overall balance assessment: Needs assistance     Sitting balance - Comments: inconsistent - pt moving alot - wanting to lay down, wanting to stand up.  needed assist for safety   Standing balance support: Bilateral upper extremity supported   Standing balance comment: Pt with poor standing balance - leaning heavily on RW with flexed posture  - given cues to stand taller.  needs +2 assist for safe standing                             Pertinent Vitals/Pain Pain Assessment: No/denies pain(asked several times during walk and no pain)    Home Living Family/patient expects to be discharged to:: Private residence Living Arrangements: Spouse/significant other Available Help at Discharge: Family;Personal care attendant;Available 24 hours/day(wife has bad back and unable to lift) Type of Home: House Home Access: Ramped entrance     Home Layout: One level Home Equipment: Walker - 2 wheels;Wheelchair - manual;Hospital bed Additional Comments: Pt has daily care about 6 hours a day.  wife is present all the time but cant lift.  son and daughter live nearby but both work.  family is open to hiring more hours of care    Prior Function Level of Independence: Needs assistance   Gait / Transfers Assistance Needed: Pt has not walked in 2-3 weeks.  Aides have been instructed to get pt up into the wheelchair when they are there - otherwise pt staying in hospital bed  ADL's / Homemaking Assistance Needed: assist of family and aides.  pt toilets in a diaper - he no longer asks or knows when he has to go to the bathroom - BSC would not be option even if he could move better        Hand Dominance        Extremity/Trunk Assessment   Upper Extremity Assessment Upper Extremity Assessment: Generalized weakness    Lower Extremity Assessment Lower Extremity Assessment: Generalized weakness    Cervical / Trunk Assessment Cervical / Trunk Assessment: Kyphotic  Communication   Communication: No difficulties(except pt with limited attention span - hard to stay on task and finish an answer etc)  Cognition Arousal/Alertness: Lethargic Behavior During Therapy: Flat affect;Agitated;Anxious(pt has been aggitated and moving a lot - after walking he was calm)                                   General Comments: pt with  limited attention span and selective attention - he would answer some questions but not others.  pt with pin point pupils but pt said he can see well.      General Comments General comments (skin integrity, edema, etc.): condom cath and IV intact during treatment.  talked to son [name] about importance of not giving pt something to eat and drink while laying down - needs to have him sitting.  they VU    Exercises     Assessment/Plan    PT Assessment Patient needs continued PT services  PT Problem List Decreased strength;Decreased activity tolerance;Decreased balance;Decreased mobility;Decreased knowledge of use of DME;Decreased safety awareness  PT Treatment Interventions DME instruction;Therapeutic activities;Gait training;Balance training;Functional mobility training;Patient/family education    PT Goals (Current goals can be found in the Care Plan section)  Acute Rehab PT Goals Patient Stated Goal: to get pt home - to be with wife with hospice care PT Goal Formulation: With patient/family Time For Goal Achievement: 07/06/17 Potential to Achieve Goals: Fair    Frequency Min 3X/week   Barriers to discharge Decreased caregiver support family is going to hire extra  hours of care.  Hospice recommended and will help family navigate keeping pt home safely    Co-evaluation PT/OT/SLP Co-Evaluation/Treatment: Yes Reason for Co-Treatment: For patient/therapist safety PT goals addressed during session: Mobility/safety with mobility;Strengthening/ROM OT goals addressed during session: Proper use of Adaptive equipment and DME;Strengthening/ROM       AM-PAC PT "6 Clicks" Daily Activity  Outcome Measure Difficulty turning over in bed (including adjusting bedclothes, sheets and blankets)?: A Little Difficulty moving from lying on back to sitting on the side of the bed? : A Little Difficulty sitting down on and standing up from a chair with arms (e.g., wheelchair, bedside commode,  etc,.)?: A Lot Help needed moving to and from a bed to chair (including a wheelchair)?: A Lot Help needed walking in hospital room?: A Lot Help needed climbing 3-5 steps with a railing? : Total 6 Click Score: 13    End of Session Equipment Utilized During Treatment: Gait belt Activity Tolerance: Patient limited by fatigue Patient left: in chair;with chair alarm set;with call bell/phone within reach;with family/visitor present   PT Visit Diagnosis: Unsteadiness on feet (R26.81);Repeated falls (R29.6);Adult, failure to thrive (R62.7);Difficulty in walking, not elsewhere classified (R26.2)    Time: 9892-1194 PT Time Calculation (min) (ACUTE ONLY): 90 min   Charges:   PT Evaluation $PT Eval Moderate Complexity: 1 Mod PT Treatments $Gait Training: 23-37 mins $Self Care/Home Management: 8-22   PT G Codes:        07/30/2017   Rande Lawman, PT   Loyal Buba 07-30-2017, 10:52 AM

## 2017-06-30 NOTE — Progress Notes (Addendum)
PROGRESS NOTE  ELIOR ROBINETTE JKK:[id number] DOB: 1933/08/31 DOA: 06/28/2017 PCP: Cari Caraway, MD  HPI/Recap of past 24 hours: Agitation waxes and wanes. The Probation officer paged behavioral health several times with no answer. Patient denies any pain. He is confused in the setting of dementia.  Assessment/Plan: Principal Problem:   Delirium Active Problems:   HYPOKALEMIA   Atrial fibrillation (HCC)   Altered mental status   H/O mechanical aortic valve replacement   Aortic valve disorder   Dementia with behavioral disturbance   Fall   AKI (acute kidney injury) ([place])   Warfarin poisoning   Warfarin-induced coagulopathy ([place])   Palliative care by specialist  Code Status: DNR  Family Communication: Daughter at bedside  Disposition Plan: Stay another midnight to correct supratherapeutic INR and improve symptoms.   Consultants:  None  Procedures:  None  Antimicrobials:  None   DVT prophylaxis:  on coumadin    Objective: Vitals:   06/29/17 0522 06/29/17 0833 06/29/17 1555 06/29/17 2246  BP: 106/81 125/62 129/69 (!) 186/77  Pulse: 85 70 62 71  Resp: 18  16 18   Temp: 98.2 F (36.8 C)  (!) 97.3 F (36.3 C) 98.7 F (37.1 C)  TempSrc: Axillary  Oral Oral  SpO2: 99%  96% 99%  Weight: 103.4 kg (228 lb)       Intake/Output Summary (Last 24 hours) at 06/30/2017 0828 Last data filed at 06/30/2017 0543 Gross per 24 hour  Intake 50 ml  Output 495 ml  Net -445 ml   Filed Weights   06/29/17 0522  Weight: 103.4 kg (228 lb)    Exam:  Physical Exam Constitutional: He appears mildly agitated  HENT: Normocephalicand atraumatic.  Mouth/Throat: No oropharyngeal exudate.  Eyes: Pupils are equal, round, and reactive to light. Conjunctivaeand EOMare normal. No scleral icterus.  Neck: Normal range of motion. Neck supple. No JVDpresent. No thyromegalypresent.  Cardiovascular: Irregularly irregular rhythmpresent. no friction rub. No  murmurheard. Pulmonary: Effort normaland breath sounds normal. No wheezes Abdominal: Soft. Bowel sounds are normal. He exhibits no distension. There is no tenderness.  Musculoskeletal: Normal range of motion. He exhibits no edemaor deformity.  Neurological: He has normal strength. Skin: Skin is warmand dry.  Psychiatric: He is mildly agitated.   Data Reviewed: CBC: Recent Labs  Lab 06/28/17 0834 06/29/17 0834  WBC 6.3 5.9  NEUTROABS 3.7  --   HGB 12.1* 12.1*  HCT 36.4* 35.8*  MCV 86.7 84.6  PLT 182 716   Basic Metabolic Panel: Recent Labs  Lab 06/28/17 0834 06/29/17 0834  NA 143 138  K 3.3* 3.3*  CL 106 103  CO2 31 28  GLUCOSE 118* 114*  BUN 20 15  CREATININE 1.29* 0.97  CALCIUM 9.6 9.3   GFR: Estimated Creatinine Clearance: 73.3 mL/min (by C-G formula based on SCr of 0.97 mg/dL). Liver Function Tests: Recent Labs  Lab 06/28/17 0834  AST 18  ALT 15*  ALKPHOS 92  BILITOT 1.2  PROT 6.3*  ALBUMIN 3.5   No results for input(s): LIPASE, AMYLASE in the last 168 hours. No results for input(s): AMMONIA in the last 168 hours. Coagulation Profile: Recent Labs  Lab 06/28/17 0834 06/29/17 0737  INR 4.48* 4.47*   Cardiac Enzymes: No results for input(s): CKTOTAL, CKMB, CKMBINDEX, TROPONINI in the last 168 hours. BNP (last 3 results) No results for input(s): PROBNP in the last 8760 hours. HbA1C: Recent Labs    06/29/17 0737  HGBA1C 6.0*   CBG: Recent Labs  Lab 06/28/17 0851  GLUCAP 101*   Lipid Profile: Recent Labs    06/29/17 0737  CHOL 97  HDL 31*  LDLCALC 44  TRIG 112  CHOLHDL 3.1   Thyroid Function Tests: No results for input(s): TSH, T4TOTAL, FREET4, T3FREE, THYROIDAB in the last 72 hours. Anemia Panel: No results for input(s): VITAMINB12, FOLATE, FERRITIN, TIBC, IRON, RETICCTPCT in the last 72 hours. Urine analysis:    Component Value Date/Time   COLORURINE YELLOW 06/28/2017 1412   APPEARANCEUR CLEAR 06/28/2017 1412   LABSPEC  1.019 06/28/2017 1412   PHURINE 5.0 06/28/2017 1412   GLUCOSEU NEGATIVE 06/28/2017 1412   HGBUR NEGATIVE 06/28/2017 1412   BILIRUBINUR NEGATIVE 06/28/2017 1412   KETONESUR NEGATIVE 06/28/2017 1412   PROTEINUR NEGATIVE 06/28/2017 1412   UROBILINOGEN 0.2 09/01/2013 1301   NITRITE NEGATIVE 06/28/2017 1412   LEUKOCYTESUR NEGATIVE 06/28/2017 1412   Sepsis Labs: @LABRCNTIP (procalcitonin:4,lacticidven:4)  )No results found for this or any previous visit (from the past 240 hour(s)).    Studies: No results found.  Scheduled Meds: . amLODipine  10 mg Oral Daily  . aspirin EC  81 mg Oral Daily  . atorvastatin  40 mg Oral q1800  . carvedilol  6.25 mg Oral BID WC  . cholecalciferol  1,000 Units Oral Daily  . digoxin  0.125 mg Oral Daily  . escitalopram  10 mg Oral Daily  . potassium chloride  8 mEq Oral Daily  . ramipril  10 mg Oral BID  . risperiDONE  0.5 mg Oral TID  . sodium chloride flush  3 mL Intravenous Q12H  . torsemide  10 mg Oral BID  . traZODone  50 mg Oral QHS  . vitamin B-12  1,000 mcg Oral Daily    Continuous Infusions: . 0.9 % NaCl with KCl 20 mEq / L Stopped (06/29/17 1910)     LOS: 1 day   Assessment and Plan:  1. Altered mental status in the setting of advanced dementia -U/A negative - No sign of active infective process - reorient  2. Resolved supratherapeutic INR on coumadin -INR is 1.56 from 4.48.  -2.5 vitamin K -resume coumadin 4 mg qhs and repeat INR in the am -repeat INR in the am  3. Hypokalemia: - resolved - BMP in a.m.  4. Permanent atrial fibrillation: - well controlled on his dig and Coreg. - resume coumadin  5. Dementia with behavioral disturbance: - ativan prn - check Qtc prior to haldol  6. Fall at home: - fall precaution - PT when more stable  7. Acute kidney injury: - resolved - hydrate patient  - avoid nephrotoxic meds - BMP in am  8. History of mechanical aortic valve replacement: - Continue  warfarin.   Kayleen Memos, MD [facility] Pager [phone]  If 7PM-7AM, please contact night-coverage [email or web address] Password [email or web address] 06/30/2017, 8:28 AM

## 2017-06-30 NOTE — Progress Notes (Signed)
SLP Cancellation Note  Patient Details Name: [name] MRN: [id number] DOB: [birth year]   Cancelled treatment:       Reason Eval/Treat Not Completed: SLP screened, no needs identified, will sign off. Reviewed OT and PT evaluations. Spoke with family, pt's daughter states plan for home with hospice services. Deny needs at this time. Will s/o.  Deneise Lever, [place], [place] Speech-Language Pathologist [phone]   [name] 06/30/2017, 3:01 PM

## 2017-06-30 NOTE — Evaluation (Signed)
Occupational Therapy Evaluation and Discharge Patient Details Name: [name] MRN: [id number] DOB: 01/06/1934 Today's Date: 06/30/2017    History of Present Illness [name] is a 83 y.o. male 83 year old with a past medical history of dementia with behavioral problems, aortic valve replacement on Coumadin, atrial fibrillation, coronary artery disease, hypertension, hyperlipidemia, hypokalemia, history of falls, was brought to the hospital by his daughters for evaluation of slurred speech, confusion and left-sided facial droop.   Clinical Impression   PTA Pt has required assist for bathing and dressing, dependent in IADL. Pt is currently at baseline for ADL, however he is requiring increased assistance needed for functional transfers. He is currently mod +2 for safety and physical assist. After extensive conversation with family They are interested in having hospice services and getting consistent help to help them navigate going forward. Pt was so calm and peaceful, greatly decreased agitation after OT and PT walked him down the hall with mod to max assist x 2 so maximizing the Pt's mobility would be very helpful - which is consistent with PT services.  All questions answered with son, and his wife (daughter in law). OT to sign off at this time as he is at baseline for ADL and main concern of family is mobility - they feel comfortable with caregiver tasks and ADL management. Thank you for the opportunity to serve this patient and their family.    Follow Up Recommendations  [facility])    Equipment Recommendations  None recommended by OT    Recommendations for Other Services Other (comment) Hospice     Precautions / Restrictions Precautions Precautions: Fall Precaution Comments: Daughter reports pt has had approximately 20 falls in last 3 months - mostly are 'melts' where pt sinks to the floor.  today educated family on how to keep the chair right behind pt when walking for  pt safety Restrictions Weight Bearing Restrictions: No      Mobility Bed Mobility Overal bed mobility: Needs Assistance Bed Mobility: Supine to Sit     Supine to sit: Mod assist;Min assist     General bed mobility comments: pt needs max cues for getting up side of bed - did well - impulsive and had to remind him several times to wait on therapists to be ready  Transfers Overall transfer level: Needs assistance Equipment used: Rolling walker (2 wheeled) Transfers: Sit to/from Stand Sit to Stand: +2 physical assistance         General transfer comment: pt needed max cues and mod assist x 2 to stand from higher bed.  pt given frequent cues to stand tall - he stays in flexed position    Balance Overall balance assessment: Needs assistance Sitting-balance support: Single extremity supported;Feet supported Sitting balance-Leahy Scale: Fair Sitting balance - Comments: inconsistent - pt moving alot - wanting to lay down, wanting to stand up.  needed assist for safety Postural control: (flexed during standing) Standing balance support: Bilateral upper extremity supported Standing balance-Leahy Scale: Poor Standing balance comment: Pt with poor standing balance - leaning heavily on RW with flexed posture - given cues to stand taller.  needs +2 assist for safe standing                           ADL either performed or assessed with clinical judgement   ADL Overall ADL's : At baseline  Vision Patient Visual Report: No change from baseline       Perception     Praxis      Pertinent Vitals/Pain Pain Assessment: Faces Faces Pain Scale: No hurt Pain Intervention(s): Monitored during session     Hand Dominance     Extremity/Trunk Assessment Upper Extremity Assessment Upper Extremity Assessment: Overall WFL for tasks assessed   Lower Extremity Assessment Lower Extremity Assessment: Defer to PT  evaluation   Cervical / Trunk Assessment Cervical / Trunk Assessment: Kyphotic   Communication Communication Communication: No difficulties   Cognition Arousal/Alertness: Lethargic Behavior During Therapy: Flat affect;Agitated;Anxious(pt has been aggitated and moving a lot - after walking he wa) Overall Cognitive Status: History of cognitive impairments - at baseline                                 General Comments: pt with limited attention span and selective attention - he would answer some questions but not others.  pt with pin point pupils but pt said he can see well.   General Comments  Pts son (and his wife) present during session    Exercises     Shoulder [place] expects to be discharged to:: Private residence Living Arrangements: Spouse/significant other Available Help at Discharge: Family;Personal care attendant;Available 24 hours/day(wife has bad back and is unable to physically assist) Type of Home: House Home Access: Ramped entrance     Home Layout: One level               Home Equipment: Walker - 2 wheels;Wheelchair - manual;Hospital bed   Additional Comments: Pt has daily care about 6 hours a day.  wife is present all the time but cant lift.  son and daughter live nearby but both work.  family is open to hiring more hours of care      Prior Functioning/Environment Level of Independence: Needs assistance  Gait / Transfers Assistance Needed: Pt has not walked in 2-3 weeks.  Aides have been instructed to get pt up into the wheelchair when they are there - otherwise pt staying in hospital bed ADL's / Homemaking Assistance Needed: assist of family and aides.  Pt dependent in dressing, pt toilets in a diaper - he no longer asks or knows when he has to go to the bathroom - BSC would not be option even if he could move better            OT Problem List: Impaired balance (sitting and/or standing);Decreased  activity tolerance      OT Treatment/Interventions:      OT Goals(Current goals can be found in the care plan section) Acute Rehab OT Goals Patient Stated Goal: to get pt home - to be with wife with hospice care OT Goal Formulation: With family Time For Goal Achievement: 07/14/17 Potential to Achieve Goals: Good  OT Frequency:     Barriers to D/C:            Co-evaluation PT/OT/SLP Co-Evaluation/Treatment: Yes Reason for Co-Treatment: Necessary to address cognition/behavior during functional activity;For patient/therapist safety;To address functional/ADL transfers PT goals addressed during session: Mobility/safety with mobility OT goals addressed during session: ADL's and self-care      AM-PAC PT "6 Clicks" Daily Activity     Outcome Measure Help from another person eating meals?: A Little Help from another person taking care of personal grooming?: A Lot Help from another  person toileting, which includes using toliet, bedpan, or urinal?: A Lot Help from another person bathing (including washing, rinsing, drying)?: A Lot Help from another person to put on and taking off regular upper body clothing?: A Lot Help from another person to put on and taking off regular lower body clothing?: A Lot 6 Click Score: 13   End of Session Equipment Utilized During Treatment: Gait belt;Rolling walker Nurse Communication: Mobility status;Other (comment)(catheter bag dripping)  Activity Tolerance: Patient tolerated treatment well Patient left: in chair;with call bell/phone within reach;with chair alarm set;with family/visitor present  OT Visit Diagnosis: Unsteadiness on feet (R26.81);Other abnormalities of gait and mobility (R26.89);Repeated falls (R29.6);History of falling (Z91.81)                Time: 2637-8588 OT Time Calculation (min): 93 min Charges:  OT General Charges $OT Visit: 1 Visit OT Evaluation $OT Eval Moderate Complexity: 1 Mod OT Treatments $Self Care/Home Management :  8-22 mins G-Codes:     Hulda Humphrey OTR/L [place] 06/30/2017, 11:49 AM

## 2017-07-01 ENCOUNTER — Encounter (HOSPITAL_COMMUNITY): Payer: Self-pay

## 2017-07-01 DIAGNOSIS — Z7901 Long term (current) use of anticoagulants: Secondary | ICD-10-CM

## 2017-07-01 DIAGNOSIS — Z5181 Encounter for therapeutic drug level monitoring: Secondary | ICD-10-CM

## 2017-07-01 DIAGNOSIS — R29810 Facial weakness: Secondary | ICD-10-CM

## 2017-07-01 LAB — CBC
HCT: 32.8 % — ABNORMAL LOW (ref 39.0–52.0)
HEMOGLOBIN: 11.3 g/dL — AB (ref 13.0–17.0)
MCH: 28.8 pg (ref 26.0–34.0)
MCHC: 34.5 g/dL (ref 30.0–36.0)
MCV: 83.5 fL (ref 78.0–100.0)
PLATELETS: 189 10*3/uL (ref 150–400)
RBC: 3.93 MIL/uL — AB (ref 4.22–5.81)
RDW: 12.7 % (ref 11.5–15.5)
WBC: 8.5 10*3/uL (ref 4.0–10.5)

## 2017-07-01 LAB — BASIC METABOLIC PANEL
ANION GAP: 8 (ref 5–15)
BUN: 13 mg/dL (ref 6–20)
CALCIUM: 9.1 mg/dL (ref 8.9–10.3)
CO2: 25 mmol/L (ref 22–32)
CREATININE: 0.98 mg/dL (ref 0.61–1.24)
Chloride: 105 mmol/L (ref 101–111)
Glucose, Bld: 115 mg/dL — ABNORMAL HIGH (ref 65–99)
Potassium: 3.3 mmol/L — ABNORMAL LOW (ref 3.5–5.1)
SODIUM: 138 mmol/L (ref 135–145)

## 2017-07-01 LAB — PROTIME-INR
INR: 1.31
INR: 1.37
PROTHROMBIN TIME: 16.2 s — AB (ref 11.4–15.2)
PROTHROMBIN TIME: 16.8 s — AB (ref 11.4–15.2)

## 2017-07-01 LAB — HEPARIN LEVEL (UNFRACTIONATED): Heparin Unfractionated: 0.25 IU/mL — ABNORMAL LOW (ref 0.30–0.70)

## 2017-07-01 MED ORDER — WARFARIN SODIUM 5 MG PO TABS
10.0000 mg | ORAL_TABLET | Freq: Once | ORAL | Status: AC
  Administered 2017-07-01: 10 mg via ORAL
  Filled 2017-07-01: qty 2

## 2017-07-01 MED ORDER — HEPARIN (PORCINE) IN NACL 100-0.45 UNIT/ML-% IJ SOLN
1600.0000 [IU]/h | INTRAMUSCULAR | Status: DC
  Administered 2017-07-01: 1500 [IU]/h via INTRAVENOUS
  Administered 2017-07-02: 1600 [IU]/h via INTRAVENOUS
  Administered 2017-07-02: 1650 [IU]/h via INTRAVENOUS
  Filled 2017-07-01 (×3): qty 250

## 2017-07-01 MED ORDER — WARFARIN - PHARMACIST DOSING INPATIENT: Freq: Every day | Status: DC

## 2017-07-01 MED ORDER — HALOPERIDOL LACTATE 5 MG/ML IJ SOLN: 1.0000 mg | Freq: Four times a day (QID) | INTRAMUSCULAR | Status: DC | PRN

## 2017-07-01 MED ORDER — TRAZODONE HCL 50 MG PO TABS
50.0000 mg | ORAL_TABLET | Freq: Once | ORAL | Status: AC
  Administered 2017-07-01: 50 mg via ORAL
  Filled 2017-07-01: qty 1

## 2017-07-01 NOTE — Plan of Care (Signed)
  Progressing Education: Knowledge of [place] Education information/materials will improve 07/01/2017 0016 - Progressing by Alva Garnet, RN Safety: Ability to remain free from injury will improve 07/01/2017 0016 - Progressing by Alva Garnet, RN Health Behavior/Discharge Planning: Ability to manage health-related needs will improve 07/01/2017 0016 - Progressing by Alva Garnet, RN Pain Managment: General experience of comfort will improve 07/01/2017 0016 - Progressing by Alva Garnet, RN Physical Regulation: Ability to maintain clinical measurements within normal limits will improve 07/01/2017 0016 - Progressing by Alva Garnet, RN Will remain free from infection 07/01/2017 0016 - Progressing by Alva Garnet, RN Skin Integrity: Risk for impaired skin integrity will decrease 07/01/2017 0016 - Progressing by Alva Garnet, RN Tissue Perfusion: Risk factors for ineffective tissue perfusion will decrease 07/01/2017 0016 - Progressing by Alva Garnet, RN Activity: Risk for activity intolerance will decrease 07/01/2017 0016 - Progressing by Alva Garnet, RN Fluid Volume: Ability to maintain a balanced intake and output will improve 07/01/2017 0016 - Progressing by Alva Garnet, RN Nutrition: Adequate nutrition will be maintained 07/01/2017 0016 - Progressing by Alva Garnet, RN Bowel/Gastric: Will not experience complications related to bowel motility 07/01/2017 0016 - Progressing by Alva Garnet, RN

## 2017-07-01 NOTE — Progress Notes (Signed)
PROGRESS NOTE  [name] QPY:[id number] DOB: Sep 10, 1933 DOA: 06/28/2017 PCP: Cari Caraway, MD  HPI/Recap of past 24 hours: Pt seen and examined w his daughter at bedside. He is restless in the bed, no sign of acute distress. Pt is unable to provide a ROS due to advanced dementia. Daughter inquires about palliative care and team consulted.Repeated INR this afternoon subtherapeutic.  Assessment/Plan: Principal Problem:   Delirium Active Problems:   HYPOKALEMIA   Atrial fibrillation (HCC)   Altered mental status   H/O mechanical aortic valve replacement   Aortic valve disorder   Dementia with behavioral disturbance   Fall   AKI (acute kidney injury) ([place])   Warfarin poisoning   Warfarin-induced coagulopathy ([place])   Palliative care by specialist   Code Status:DNR  Family Communication:Daughter at bedside  Disposition Plan:Stay another midnight to correct supratherapeutic INR and improve symptoms.   Consultants:  None  Procedures:  None  Antimicrobials:  None   DVT prophylaxis: on coumadin, heparin bridge     Objective: Vitals:   06/29/17 2246 06/30/17 1039 06/30/17 1453 06/30/17 2311  BP: (!) 186/77 118/65 (!) 100/52 (!) 143/72  Pulse: 71 75 80 89  Resp: 18  18 18   Temp: 98.7 F (37.1 C)  98.8 F (37.1 C) 98.1 F (36.7 C)  TempSrc: Oral   Axillary  SpO2: 99%  96% 97%  Weight:        Intake/Output Summary (Last 24 hours) at 07/01/2017 0759 Last data filed at 07/01/2017 0752 Gross per 24 hour  Intake 1942.5 ml  Output 300 ml  Net 1642.5 ml   Filed Weights   06/29/17 0522  Weight: 103.4 kg (228 lb)    Exam:  Physical Exam Constitutional: He appears restless  HENT: Normocephalicand atraumatic.  Mouth/Throat: No oropharyngeal exudate.  Eyes: Pupils are equal, round, and reactive to light. Conjunctivaeand EOMare normal. No scleral icterus.  Neck: Normal range of motion. Neck supple. No JVDpresent. No  thyromegalypresent.  Cardiovascular:Irregularly irregular rhythmpresent. no friction rub. No murmurheard. Pulmonary: Effort normaland breath sounds normal. [place]heezes Abdominal: Soft. Bowel sounds are normal. He exhibits no distension. There is no tenderness.  Musculoskeletal: Normal range of motion. He exhibits no edemaor deformity.  Neurological: He has normal strength. Skin: Skin is warmand dry.  Psychiatric: Restlessness in the setting of advanced dementia   Data Reviewed: CBC: Recent Labs  Lab 06/28/17 0834 06/29/17 0834 06/30/17 0928 07/01/17 0529  WBC 6.3 5.9 6.2 8.5  NEUTROABS 3.7  --   --   --   HGB 12.1* 12.1* 11.9* 11.3*  HCT 36.4* 35.8* 34.3* 32.8*  MCV 86.7 84.6 83.5 83.5  PLT 182 187 193 124   Basic Metabolic Panel: Recent Labs  Lab 06/28/17 0834 06/29/17 0834 06/30/17 0928 07/01/17 0529  NA 143 138 139 138  K 3.3* 3.3* 3.5 3.3*  CL 106 103 105 105  CO2 31 28 26 25   GLUCOSE 118* 114* 165* 115*  BUN 20 15 13 13   CREATININE 1.29* 0.97 0.95 0.98  CALCIUM 9.6 9.3 9.5 9.1   GFR: Estimated Creatinine Clearance: 72.6 mL/min (by C-G formula based on SCr of 0.98 mg/dL). Liver Function Tests: Recent Labs  Lab 06/28/17 0834  AST 18  ALT 15*  ALKPHOS 92  BILITOT 1.2  PROT 6.3*  ALBUMIN 3.5   No results for input(s): LIPASE, AMYLASE in the last 168 hours. No results for input(s): AMMONIA in the last 168 hours. Coagulation Profile: Recent Labs  Lab 06/28/17 0834 06/29/17 0737  06/30/17 0928 07/01/17 0529  INR 4.48* 4.47* 1.58 1.31   Cardiac Enzymes: No results for input(s): CKTOTAL, CKMB, CKMBINDEX, TROPONINI in the last 168 hours. BNP (last 3 results) No results for input(s): PROBNP in the last 8760 hours. HbA1C: Recent Labs    06/29/17 0737  HGBA1C 6.0*   CBG: Recent Labs  Lab 06/28/17 0851  GLUCAP 101*   Lipid Profile: Recent Labs    06/29/17 0737  CHOL 97  HDL 31*  LDLCALC 44  TRIG 112  CHOLHDL 3.1   Thyroid Function  Tests: No results for input(s): TSH, T4TOTAL, FREET4, T3FREE, THYROIDAB in the last 72 hours. Anemia Panel: No results for input(s): VITAMINB12, FOLATE, FERRITIN, TIBC, IRON, RETICCTPCT in the last 72 hours. Urine analysis:    Component Value Date/Time   COLORURINE YELLOW 06/28/2017 1412   APPEARANCEUR CLEAR 06/28/2017 1412   LABSPEC 1.019 06/28/2017 1412   PHURINE 5.0 06/28/2017 1412   GLUCOSEU NEGATIVE 06/28/2017 1412   HGBUR NEGATIVE 06/28/2017 1412   BILIRUBINUR NEGATIVE 06/28/2017 1412   KETONESUR NEGATIVE 06/28/2017 1412   PROTEINUR NEGATIVE 06/28/2017 1412   UROBILINOGEN 0.2 09/01/2013 1301   NITRITE NEGATIVE 06/28/2017 1412   LEUKOCYTESUR NEGATIVE 06/28/2017 1412   Sepsis Labs: @LABRCNTIP (procalcitonin:4,lacticidven:4)  )No results found for this or any previous visit (from the past 240 hour(s)).    Studies: No results found.  Scheduled Meds: . amLODipine  10 mg Oral Daily  . aspirin EC  81 mg Oral Daily  . atorvastatin  40 mg Oral q1800  . carvedilol  6.25 mg Oral BID WC  . cholecalciferol  1,000 Units Oral Daily  . digoxin  0.125 mg Oral Daily  . escitalopram  10 mg Oral Daily  . potassium chloride  8 mEq Oral Daily  . ramipril  10 mg Oral BID  . risperiDONE  0.5 mg Oral TID  . sodium chloride flush  3 mL Intravenous Q12H  . torsemide  10 mg Oral BID  . traZODone  50 mg Oral QHS  . vitamin B-12  1,000 mcg Oral Daily  . warfarin  4 mg Oral q1800  . Warfarin - Physician Dosing Inpatient   Does not apply q1800    Continuous Infusions: . 0.9 % NaCl with KCl 20 mEq / L 75 mL/hr at 06/30/17 2240     LOS: 2 days   Assessment and Plan:  1.Altered mental status in the setting of advanced dementia -U/A negative - No sign of active infective process - reorient as needed  2.subtherapeutic INR -INR is 1.37 from 4.48 on admission -2.5 mg po vitamin K 06/29/17 -Coumadin/heparin bridge -INR in the am  3. Hypokalemia: -resolved -BMP in  a.m.  4. Permanent atrial fibrillation: -well controlled on his dig and Coreg. - coumadin with heparin bridge  5. Dementia with behavioral disturbance: - ativan prn - check Qtc prior to haldol IM  6. Fall at home: - fall precaution - PT when more stable  7. Acute kidney injury: - resolved - IVhydration  - avoid nephrotoxic meds - BMP in am  8. History of mechanical aortic valve replacement: - Continue warfarin.   Kayleen Memos, MD [facility] Pager [phone]  If 7PM-7AM, please contact night-coverage [email or web address] Password [email or web address] 07/01/2017, 7:59 AM

## 2017-07-01 NOTE — Progress Notes (Signed)
Patient was very agitated and try to get out from the medicine. Notified  on call NP and given schedule medicine 1 hour earlier as per order.

## 2017-07-01 NOTE — Consult Note (Signed)
   [facility] CM Inpatient Consult   07/01/2017  [name] Apr 07, 1934 [id number]  Chart reviewed for [name] as patient has multiple admissions in the [facility]. 83 year old with HX of CABG, end stage dementia.   Chart review reveals the patient will discharge Home with  Hospice and [place]. No [facility] Care Management needs noted or appropriate at this time.  For questions, please contact:  Natividad Brood, RN BSN [facility] Liaison  [phone] business mobile phone Toll free office [phone]

## 2017-07-01 NOTE — Progress Notes (Signed)
ANTICOAGULATION CONSULT NOTE - Follow-Up  Pharmacy Consult for warfarin and heparin bridge Indication: atrial fibrillation and mechanical AVR  No Known Allergies  Patient Measurements: Weight: 228 lb (103.4 kg)  Ideal Body Weight: 86.8 kg Heparin Dosing Weight: 103 kg  Vital Signs: Temp: 98.2 F (36.8 C) (11/05 1226) Temp Source: Oral (11/05 1226) BP: 128/59 (11/05 1226) Pulse Rate: 101 (11/05 1226)  Labs: Recent Labs    06/29/17 0834 06/30/17 0928 07/01/17 0529 07/01/17 1413  HGB 12.1* 11.9* 11.3*  --   HCT 35.8* 34.3* 32.8*  --   PLT 187 193 189  --   LABPROT  --  18.7* 16.2* 16.8*  INR  --  1.58 1.31 1.37  HEPARINUNFRC  --   --   --  0.25*  CREATININE 0.97 0.95 0.98  --     Estimated Creatinine Clearance: 72.6 mL/min (by C-G formula based on SCr of 0.98 mg/dL).   Assessment: 83yoM who was on warfarin PTA for hx of afib and mech AVR. Admitted 11/2 after CVA like symptoms and fall. INR 4.48 on admission and noted to have decrease PO intake for 5 days prior to event. Received vit K PO 2.5 mg on 11/03. Last dose PTA 8 mg on 11/1. Warfarin restarted 11/4. Heparin bridge started 11/5 AM. CBC stable.  PTA warfarin dose 8 mg on Tues, Wedn, Thurs, Sat; 4 mg Sun, Mon, Fri  Heparin level this afternoon was SUBtherapeutic however was drawn 2.5 hours early (HL 0.25, goal of 0.3-0.7). Will increase the rate but not aggressively given early level - will plan to recheck an 8 hr HL.   Goal of Therapy:  INR 2-3 Heparin level 0.3-0.7 units/ml Monitor platelets by anticoagulation protocol: Yes   Plan:  1. Increase Heparin drip rate to 1650 units/hr (16.5 ml/hr) 2. Will continue to monitor for any signs/symptoms of bleeding and will follow up with heparin level in 8 hours   Thank you for allowing pharmacy to be a part of this patient's care.  Alycia Rossetti, PharmD, BCPS Clinical Pharmacist Pager: [phone] If after 3:30p, please call main pharmacy at: [phone] 07/01/2017 5:29  PM

## 2017-07-01 NOTE — Progress Notes (Signed)
Notified by Neoma Laming [facility] of family request for Hospice and [place] services at home after discharge. Chart and patient information currently under review to confirm hospice eligibility.   Spoke with Jennelle Human, at bedside to initiate education related to hospice philosophy, services and team approach to care. Family verbalized understanding of the information provided. Per discussion, plan is for discharge to home by personal vehicle with son and daughter today.   Please send signed completed DNR form home with patient.  Patient will need prescriptions for discharge comfort medications.   DME needs discussed and family denies needs at this time.  Hospice will evaluate home environment and make recommendations accordingly, per family request.  Completed discharge summary will need to be faxed to [facility] at [phone] when final.  Please notify [facility] when patient is ready to leave unit at discharge-call [phone].   [facility] information and contact numbers have been given to [facility] during visit.   Above information shared with Neoma Laming, [facility].   Please call with any questions.  Thank You,  Freddi Starr RN, [facility] Liaison  [phone]

## 2017-07-01 NOTE — Care Management Note (Addendum)
Case Management Note  Patient Details  Name: [name] MRN: [id number] Date of Birth: 11-May-1934  Subjective/Objective:   NCM spoke with daughter [name], she chose [facility], referral made to [facility] with [facility], daughter states they have a hospital bed and a w/chair at home that they are renting from [facility], they also have a bsc and a rolling walker which is the daguhter's (she had when she had her surgery)  Daughter's cell is [phone]. Per daughter , patient will be transported by car.    1544 11/5 [name] Bamberger RN, BSN - spoke with [name] , she states patient has been approved for Hospice, she should be good to dc today.             Action/Plan: DC home with hospice  [facility] when Hospice set up with family.  Expected Discharge Date:                  Expected Discharge Plan:  Home w Hospice Care  In-House Referral:     Discharge planning Services  CM Consult  Post Acute Care Choice:  Hospice Choice offered to:     DME Arranged:    DME Agency:     HH Arranged:  RN [place] Agency:  [facility]  Status of Service:  Completed, signed off  If discussed at [employer] of Stay Meetings, dates discussed:    Additional Comments:  [name] Mayo, RN 07/01/2017, 11:52 AM

## 2017-07-01 NOTE — Progress Notes (Signed)
Physical Therapy Treatment Patient Details Name: [name] MRN: [id number] DOB: 01-08-1934 Today's Date: 07/01/2017    History of Present Illness [name] is a 83 y.o. male 83 year old with a past medical history of dementia with behavioral problems, aortic valve replacement on Coumadin, atrial fibrillation, coronary artery disease, hypertension, hyperlipidemia, hypokalemia, history of falls, was brought to the hospital by his daughters for evaluation of slurred speech, confusion and left-sided facial droop.    PT Comments    Pt lethargic but willing to participate with therapy. Pt ambulated in hall for 60 feet before fatiguing. Chair to follow close behind, as pt needs to sit suddenly. Pt required max A for walker management, however was able to progress LEs without assist. Pt would benefit from continued skilled PT to increase functional independence and activity tolerance. Will continue to follow acutely.   Follow Up Recommendations  Other (comment)(recommending pt home with hospice - this would make him not eligible for [facility] services.  pt would benefit from PT services if allowed by Hospice)     Equipment Recommendations  Harrel Lemon lift through [facility] )    Recommendations for Other Services       Precautions / Restrictions Precautions Precautions: Fall Precaution Comments: Daughter reports pt has had approximately 20 falls in last 3 months - mostly are 'melts' where pt sinks to the floor.  today educated family on how to keep the chair right behind pt when walking for pt safety Restrictions Weight Bearing Restrictions: No    Mobility  Bed Mobility Overal bed mobility: Needs Assistance Bed Mobility: Supine to Sit     Supine to sit: Min assist;+2 for physical assistance     General bed mobility comments: pt needs max cues for getting up side of bed - did well - impulsive and had to remind him several times to wait on therapists to be ready  Transfers Overall  transfer level: Needs assistance Equipment used: Rolling walker (2 wheeled);2 person hand held assist Transfers: Sit to/from [facility] Sit to Stand: +2 physical assistance;Mod assist(light mod) Stand pivot transfers: Mod assist;+2 physical assistance       General transfer comment: Pt required cueing for hand placement to stand from bed and recliner. HHA used for stand pivot transfer.  Ambulation/Gait Ambulation/Gait assistance: +2 physical assistance(3rd person with chair to follow) Ambulation Distance (Feet): 60 Feet Assistive device: Rolling walker (2 wheeled) Gait Pattern/deviations: Trunk flexed;Decreased step length - right;Decreased step length - left;Shuffle;Wide base of support;Drifts right/left     General Gait Details: Pt with flexed trunk and hips outside RW. Pt able to correct in static standing, however returned to poor posture with ambulation. Max A for walker management and to correct pt's R drift. Flexed posture increased as pt fatigued.  Chair followed close behind, as pt needs to sit quickly.   Stairs            Wheelchair Mobility    Modified Rankin (Stroke Patients Only)       Balance Overall balance assessment: Needs assistance Sitting-balance support: Single extremity supported;Feet supported Sitting balance-Leahy Scale: Fair Sitting balance - Comments: inconsistent - pt moving alot - wanting to lay down, wanting to stand up.  needed assist for safety Postural control: (flexed during standing) Standing balance support: Bilateral upper extremity supported Standing balance-Leahy Scale: Poor Standing balance comment: Pt with poor standing balance - leaning heavily on RW with flexed posture - given cues to stand taller.  needs +2 assist for safe standing  Cognition Arousal/Alertness: Lethargic Behavior During Therapy: Flat affect(pt has been aggitated and moving a lot - after walking he  wa) Overall Cognitive Status: History of cognitive impairments - at baseline                                 General Comments: Hard of hearing. Able to follow 1 step commands inconsistantly given incresed time.      Exercises      General Comments General comments (skin integrity, edema, etc.): pts daughter present and involved in treatment      Pertinent Vitals/Pain Pain Assessment: Faces Faces Pain Scale: No hurt Pain Intervention(s): Monitored during session    Home Living                      Prior Function            PT Goals (current goals can now be found in the care plan section) Acute Rehab PT Goals Patient Stated Goal: to get pt home - to be with wife with hospice care PT Goal Formulation: With patient/family Time For Goal Achievement: 07/06/17 Potential to Achieve Goals: Fair Progress towards PT goals: Progressing toward goals    Frequency    Min 3X/week      PT Plan Current plan remains appropriate    Co-evaluation              AM-PAC PT "6 Clicks" Daily Activity  Outcome Measure  Difficulty turning over in bed (including adjusting bedclothes, sheets and blankets)?: A Little Difficulty moving from lying on back to sitting on the side of the bed? : A Little Difficulty sitting down on and standing up from a chair with arms (e.g., wheelchair, bedside commode, etc,.)?: A Lot Help needed moving to and from a bed to chair (including a wheelchair)?: A Lot Help needed walking in hospital room?: A Lot Help needed climbing 3-5 steps with a railing? : Total 6 Click Score: 13    End of Session Equipment Utilized During Treatment: Gait belt Activity Tolerance: Patient limited by fatigue Patient left: with chair alarm set;with call bell/phone within reach;with family/visitor present;in bed   PT Visit Diagnosis: Unsteadiness on feet (R26.81);Repeated falls (R29.6);Adult, failure to thrive (R62.7);Difficulty in walking, not  elsewhere classified (R26.2)     Time: 3614-4315 PT Time Calculation (min) (ACUTE ONLY): 27 min  Charges:  $Gait Training: 8-22 mins $Therapeutic Activity: 8-22 mins                    G Codes:      Benjiman Core, [place] Pager [phone] Acute Rehab   Allena Katz 07/01/2017, 11:33 AM

## 2017-07-01 NOTE — Plan of Care (Signed)
  Progressing Education: Knowledge of [place] Education information/materials will improve 07/01/2017 0700 - Progressing by Alva Garnet, RN 07/01/2017 0016 - Progressing by Alva Garnet, RN Safety: Ability to remain free from injury will improve 07/01/2017 0700 - Progressing by Alva Garnet, RN 07/01/2017 0016 - Progressing by Alva Garnet, RN Health Behavior/Discharge Planning: Ability to manage health-related needs will improve 07/01/2017 0700 - Progressing by Alva Garnet, RN 07/01/2017 0016 - Progressing by Alva Garnet, RN Pain Managment: General experience of comfort will improve 07/01/2017 0700 - Progressing by Alva Garnet, RN 07/01/2017 0016 - Progressing by Alva Garnet, RN Physical Regulation: Ability to maintain clinical measurements within normal limits will improve 07/01/2017 0700 - Progressing by Alva Garnet, RN 07/01/2017 0016 - Progressing by Alva Garnet, RN Will remain free from infection 07/01/2017 0700 - Progressing by Alva Garnet, RN 07/01/2017 0016 - Progressing by Alva Garnet, RN Skin Integrity: Risk for impaired skin integrity will decrease 07/01/2017 0700 - Progressing by Alva Garnet, RN 07/01/2017 0016 - Progressing by Alva Garnet, RN Tissue Perfusion: Risk factors for ineffective tissue perfusion will decrease 07/01/2017 0700 - Progressing by Alva Garnet, RN 07/01/2017 0016 - Progressing by Alva Garnet, RN Activity: Risk for activity intolerance will decrease 07/01/2017 0700 - Progressing by Alva Garnet, RN 07/01/2017 0016 - Progressing by Alva Garnet, RN Fluid Volume: Ability to maintain a balanced intake and output will improve 07/01/2017 0700 - Progressing by Alva Garnet, RN 07/01/2017 0016 - Progressing by Alva Garnet, RN Nutrition: Adequate nutrition will be maintained 07/01/2017 0700 - Progressing by Alva Garnet, RN 07/01/2017 0016 - Progressing by Alva Garnet, RN Bowel/Gastric: Will not experience complications  related to bowel motility 07/01/2017 0700 - Progressing by Alva Garnet, RN 07/01/2017 0016 - Progressing by Alva Garnet, RN

## 2017-07-01 NOTE — Progress Notes (Signed)
ANTICOAGULATION CONSULT NOTE - Initial Consult  Pharmacy Consult for warfarin and heparin bridge Indication: atrial fibrillation and mechanical AVR  No Known Allergies  Patient Measurements: Weight: 228 lb (103.4 kg) Heparin Dosing Weight: 103 kg  Vital Signs: Temp: 98.1 F (36.7 C) (11/04 2311) Temp Source: Axillary (11/04 2311) BP: 143/72 (11/04 2311) Pulse Rate: 89 (11/04 2311)  Labs: Recent Labs    06/29/17 0737  06/29/17 0834 06/30/17 0928 07/01/17 0529  HGB  --    < > 12.1* 11.9* 11.3*  HCT  --   --  35.8* 34.3* 32.8*  PLT  --   --  187 193 189  LABPROT 41.6*  --   --  18.7* 16.2*  INR 4.47*  --   --  1.58 1.31  CREATININE  --   --  0.97 0.95 0.98   < > = values in this interval not displayed.    Estimated Creatinine Clearance: 72.6 mL/min (by C-G formula based on SCr of 0.98 mg/dL).   Medical History: Past Medical History:  Diagnosis Date  . Aortic valve replaced   . Arthritis   . Atrial fibrillation ([place])    longterm persistent; anticoagulated with warfarin  . Back pain, chronic    SINCE BACK INJURY / FRACTURE  . BPH (benign prostatic hyperplasia)    grapey  . CAD (coronary artery disease)   . Cancer (HCC)    RIGHT RENAL  . Constipation   . Cough 06/16/12   C/O OF COUGH / COLD FOR A COUPLE OF WEEKS  . Dementia   . Diverticulitis    history  . Elevated fasting glucose 2012  . GERD (gastroesophageal reflux disease)   . HTN (hypertension)   . Hyperlipidemia   . Hypokalemia   . Incontinence   . Kidney disorder    ablation defect in the right kidney lower pole but progression of interpolar lesion: stable on repeat CT (06/2014)  . Right renal mass    renal cell carcinoma  . Shortness of breath    PT STATES RELATED TO HIS AF AND HEART BEING OUT OF RHYTHM  . Stomach ulcer     Medications:  Medications Prior to Admission  Medication Sig Dispense Refill Last Dose  . amLODipine (NORVASC) 10 MG tablet Take 1 tablet (10 mg total) by mouth daily.  30 tablet 0 06/27/2017 at Unknown time  . aspirin EC 81 MG EC tablet Take 1 tablet (81 mg total) by mouth daily. 30 tablet 0 06/27/2017 at Unknown time  . atorvastatin (LIPITOR) 40 MG tablet Take 40 mg by mouth daily.  3 06/27/2017 at Unknown time  . carvedilol (COREG) 6.25 MG tablet Take 6.25 mg by mouth 2 (two) times daily with a meal.   06/27/2017 at 530p  . cholecalciferol (VITAMIN D) 1000 units tablet Take 1,000 Units by mouth daily.   06/27/2017 at Unknown time  . DIGOX 125 MCG tablet TAKE 1 TABLET(125 MCG) BY MOUTH DAILY 30 tablet 11 06/27/2017 at Unknown time  . escitalopram (LEXAPRO) 10 MG tablet Take 10 mg by mouth daily.   06/27/2017 at Unknown time  . LORazepam (ATIVAN) 1 MG tablet Take 1 tablet (1 mg total) by mouth at bedtime. 30 tablet 2 06/27/2017 at Unknown time  . potassium chloride (KLOR-CON) 8 MEQ tablet TAKE 1 TABLET(8 MEQ) BY MOUTH DAILY 30 tablet 9 06/27/2017 at Unknown time  . ramipril (ALTACE) 10 MG capsule Take 10 mg by mouth 2 (two) times daily.   06/27/2017 at Unknown time  .  risperiDONE (RISPERDAL) 0.5 MG tablet Take 1 tablet (0.5 mg total) by mouth 3 (three) times daily. 270 tablet 1 06/27/2017 at Unknown time  . temazepam (RESTORIL) 15 MG capsule Take 1 capsule (15 mg total) by mouth at bedtime as needed for sleep. (Patient taking differently: Take 30 mg by mouth at bedtime as needed for sleep. ) 30 capsule 4 06/27/2017 at Unknown time  . torsemide (DEMADEX) 10 MG tablet TAKE 1 TABLET BY MOUTH TWICE DAILY 60 tablet 5 06/27/2017 at Unknown time  . vitamin B-12 (CYANOCOBALAMIN) 1000 MCG tablet Take 1,000 mcg by mouth daily.     06/27/2017 at Unknown time  . warfarin (COUMADIN) 4 MG tablet Take 2 tablets (8 mg total) by mouth at bedtime. Continue home dose of 8mg , Needs INR check in 4-5days (Patient taking differently: Take 4-8 mg by mouth at bedtime. 8 mg on Tuesday, Wednesday, Thursday, Saturday and Sunday. All other days are 4 mg)   06/27/2017 at 5;30p  . ferrous sulfate 325 (65 FE) MG  EC tablet Take 325 mg by mouth daily with breakfast.   Not Taking at Unknown time  . nitroGLYCERIN (NITROSTAT) 0.4 MG SL tablet Place 1 tablet (0.4 mg total) under the tongue every 5 (five) minutes as needed. For chest pain 25 tablet 5 unk  . traZODone (DESYREL) 50 MG tablet Take 1 tablet (50 mg total) by mouth at bedtime. May take 2 qhs if needed 60 tablet 0 not started yret    Assessment: 83yoM who was on warfarin PTA for hx of afib and mech AVR. Admitted 11/2 after CVA like symptoms and fall. INR 4.48 on admission and noted to have decrease PO intake for 5 days prior to event. Received vit K PO 2.5 mg on 11/03. Last dose PTA 8 mg on 11/1. Warfarin restarted 11/4. CBC stable.  PTA warfarin dose 8 mg on Tues, Wedn, Thurs, Sat; 4 mg Sun, Mon, Fri  Goal of Therapy:  INR 2-3 Heparin level 0.3-0.7 units/ml Monitor platelets by anticoagulation protocol: Yes   Plan:  Give warfarin 10 mg po x 1 Monitor daily INR, CBC, clinical course, s/sx of bleed, PO intake, DDI  Start heparin infusion at 1500 units/hr Check anti-Xa level in 8 hours and daily while on heparin Continue to monitor H&H and platelets  Thank you for allowing [place] to participate in this patients care.  Jens Som, PharmD Clinical phone for 07/01/2017 from 7a-3:30p: x [phone] If after 3:30p, please call main pharmacy at: [phone] 07/01/2017 8:32 AM

## 2017-07-02 ENCOUNTER — Telehealth: Payer: Self-pay | Admitting: *Deleted

## 2017-07-02 DIAGNOSIS — I482 Chronic atrial fibrillation: Secondary | ICD-10-CM

## 2017-07-02 DIAGNOSIS — D6832 Hemorrhagic disorder due to extrinsic circulating anticoagulants: Secondary | ICD-10-CM

## 2017-07-02 DIAGNOSIS — R4182 Altered mental status, unspecified: Secondary | ICD-10-CM

## 2017-07-02 DIAGNOSIS — Z952 Presence of prosthetic heart valve: Secondary | ICD-10-CM

## 2017-07-02 DIAGNOSIS — N179 Acute kidney failure, unspecified: Secondary | ICD-10-CM

## 2017-07-02 DIAGNOSIS — I359 Nonrheumatic aortic valve disorder, unspecified: Secondary | ICD-10-CM

## 2017-07-02 DIAGNOSIS — Z515 Encounter for palliative care: Secondary | ICD-10-CM

## 2017-07-02 DIAGNOSIS — T45515A Adverse effect of anticoagulants, initial encounter: Secondary | ICD-10-CM

## 2017-07-02 DIAGNOSIS — E876 Hypokalemia: Secondary | ICD-10-CM

## 2017-07-02 DIAGNOSIS — W19XXXD Unspecified fall, subsequent encounter: Secondary | ICD-10-CM

## 2017-07-02 DIAGNOSIS — T45514A Poisoning by anticoagulants, undetermined, initial encounter: Secondary | ICD-10-CM

## 2017-07-02 DIAGNOSIS — R410 Disorientation, unspecified: Secondary | ICD-10-CM

## 2017-07-02 LAB — CBC WITH DIFFERENTIAL/PLATELET
BASOS ABS: 0 10*3/uL (ref 0.0–0.1)
BASOS PCT: 0 %
EOS ABS: 0.1 10*3/uL (ref 0.0–0.7)
Eosinophils Relative: 1 %
HEMATOCRIT: 31.8 % — AB (ref 39.0–52.0)
Hemoglobin: 10.9 g/dL — ABNORMAL LOW (ref 13.0–17.0)
Lymphocytes Relative: 14 %
Lymphs Abs: 1.7 10*3/uL (ref 0.7–4.0)
MCH: 28.8 pg (ref 26.0–34.0)
MCHC: 34.3 g/dL (ref 30.0–36.0)
MCV: 83.9 fL (ref 78.0–100.0)
MONO ABS: 1 10*3/uL (ref 0.1–1.0)
MONOS PCT: 8 %
NEUTROS ABS: 9.5 10*3/uL — AB (ref 1.7–7.7)
NEUTROS PCT: 77 %
PLATELETS: 179 10*3/uL (ref 150–400)
RBC: 3.79 MIL/uL — ABNORMAL LOW (ref 4.22–5.81)
RDW: 12.9 % (ref 11.5–15.5)
WBC: 12.2 10*3/uL — ABNORMAL HIGH (ref 4.0–10.5)

## 2017-07-02 LAB — BASIC METABOLIC PANEL
ANION GAP: 5 (ref 5–15)
BUN: 10 mg/dL (ref 6–20)
CO2: 28 mmol/L (ref 22–32)
Calcium: 9 mg/dL (ref 8.9–10.3)
Chloride: 104 mmol/L (ref 101–111)
Creatinine, Ser: 1.03 mg/dL (ref 0.61–1.24)
GFR calc non Af Amer: >60 mL/min (ref >60)
Glucose, Bld: 138 mg/dL — ABNORMAL HIGH (ref 65–99)
Potassium: 3.7 mmol/L (ref 3.5–5.1)
SODIUM: 137 mmol/L (ref 135–145)

## 2017-07-02 LAB — HEPARIN LEVEL (UNFRACTIONATED)
HEPARIN UNFRACTIONATED: 0.71 [IU]/mL — AB (ref 0.30–0.70)
Heparin Unfractionated: 0.59 IU/mL (ref 0.30–0.70)

## 2017-07-02 LAB — PROTIME-INR
INR: 1.66
INR: 1.88
PROTHROMBIN TIME: 19.4 s — AB (ref 11.4–15.2)
Prothrombin Time: 21.4 seconds — ABNORMAL HIGH (ref 11.4–15.2)

## 2017-07-02 MED ORDER — WARFARIN SODIUM 4 MG PO TABS
4.0000 mg | ORAL_TABLET | Freq: Once | ORAL | Status: AC
  Administered 2017-07-02: 4 mg via ORAL
  Filled 2017-07-02: qty 1

## 2017-07-02 MED ORDER — WARFARIN SODIUM 4 MG PO TABS
8.0000 mg | ORAL_TABLET | Freq: Once | ORAL | Status: DC
  Filled 2017-07-02: qty 2

## 2017-07-02 NOTE — Progress Notes (Signed)
Patient was discharged home with Hospice by MD order; discharged instructions  review and give to patient and his daughter with care notes and DNR form; IV DIC; patient will be transported to home via [place]. Daughter at bedside

## 2017-07-02 NOTE — Telephone Encounter (Signed)
Spoke with Dr Nevada Crane who wants pt seen tomorrow by Dr Johnsie Cancel or APP States cannot let him be discharged unless appt scheduled . Instructed we could see him in coumadin clinic tomorrow but she wanted him seen by a Doctor We have not seen him since April and has been followed by his PCP  Spoke with scheduling and they state will call to try to get him scheduled to be seen tomorrow by some one. Also informed Dr Nevada Crane if transfer to scheduling  does not go through to please call back at [phone]  and ask for schedulers

## 2017-07-02 NOTE — Progress Notes (Signed)
ANTICOAGULATION CONSULT NOTE - Follow up [facility] for warfarin and heparin bridge Indication: atrial fibrillation and mechanical AVR  No Known Allergies  Patient Measurements: Weight: 228 lb (103.4 kg) Heparin Dosing Weight: 103 kg  Vital Signs: Temp: 99.5 F (37.5 C) (11/06 0544) Temp Source: Oral (11/06 0544) BP: 157/71 (11/06 0544) Pulse Rate: 69 (11/06 0544)  Labs: Recent Labs    06/30/17 0928 07/01/17 0529 07/01/17 1413 07/02/17 0307 07/02/17 1429  HGB 11.9* 11.3*  --  10.9*  --   HCT 34.3* 32.8*  --  31.8*  --   PLT 193 189  --  179  --   LABPROT 18.7* 16.2* 16.8* 19.4* 21.4*  INR 1.58 1.31 1.37 1.66 1.88  HEPARINUNFRC  --   --  0.25* 0.71* 0.59  CREATININE 0.95 0.98  --  1.03  --     Estimated Creatinine Clearance: 69.1 mL/min (by C-G formula based on SCr of 1.03 mg/dL).  Assessment: 83yoM who was on warfarin PTA for hx of afib and mech AVR. Admitted 11/2 after CVA like symptoms and fall. INR 4.48 on admission and noted to have decrease PO intake for 5 days prior to event. Received vit K PO 2.5 mg on 11/03. Last dose PTA 8 mg on 11/1. Warfarin restarted 11/4. CBC stable.  Heparin level is therapeutic this afternoon at 0.59. INR remains subtherapeutic but is trending up to 1.88 this afternoon.  PTA warfarin dose: 8 mg on Tues, Wedn, Thurs, Sat; 4 mg Sun, Mon, Fri  Goal of Therapy:  INR 2-3 Heparin level 0.3-0.7 units/ml Monitor platelets by anticoagulation protocol: Yes   Plan:  Give warfarin 4 mg po x 1 Monitor daily INR, CBC, clinical course, s/sx of bleed, PO intake, DDI  Continue heparin infusion at 1600 units/hr until INR >/= 2 Check anti-Xa level daily while on heparin Continue to monitor H&H and platelets  Thank you for allowing [place] to participate in this patients care.  Jens Som, PharmD Clinical phone for 07/02/2017 from 7a-3:30p: x [phone] If after 3:30p, please call main pharmacy at: [phone] 07/02/2017 3:18 PM

## 2017-07-02 NOTE — Progress Notes (Signed)
ANTICOAGULATION CONSULT NOTE - Follow Up Consult  Pharmacy Consult for heparin Indication: atrial fibrillation and mechanical AVR  Labs: Recent Labs    06/30/17 0928 07/01/17 0529 07/01/17 1413 07/02/17 0307  HGB 11.9* 11.3*  --  10.9*  HCT 34.3* 32.8*  --  31.8*  PLT 193 189  --  179  LABPROT 18.7* 16.2* 16.8* 19.4*  INR 1.58 1.31 1.37 1.66  HEPARINUNFRC  --   --  0.25* 0.71*  CREATININE 0.95 0.98  --  1.03    Assessment: 83yo male now slightly above goal on heparin after rate increase.  Goal of Therapy:  Heparin level 0.3-0.7 units/ml   Plan:  Will decrease heparin gtt slightly to 1600 units/hr and check level in [place], PharmD, BCPS  07/02/2017,5:31 AM

## 2017-07-02 NOTE — Discharge Summary (Signed)
Discharge Summary  [name] LOV:[id number] DOB: [birth year]  PCP: Cari Caraway, MD  Admit date: 06/28/2017 Discharge date: 07/02/2017  Time spent: 25 minutes  Recommendations for Outpatient Follow-up:   1. Patient's daughter who is also power of attorney informed the writer that he will be followed by hospice and all medical management will be deferred to hospice including INR which is currently subtherapeutic 1.88 in the setting of A-fib and mechanical valve.  2. Follow up with hospice care as directed by the patient's daughter who is also his medical power of attorney.   Discharge Diagnoses:  Active Hospital Problems   Diagnosis Date Noted  . Delirium 06/28/2017  . Palliative care by specialist   . AKI (acute kidney injury) ([place]) 06/28/2017  . Warfarin poisoning 06/28/2017  . Warfarin-induced coagulopathy ([place]) 06/28/2017    Class: Acute  . Fall 03/11/2017  . Dementia with behavioral disturbance 08/23/2016  . Aortic valve disorder 10/23/2010  . Atrial fibrillation ([place]) 01/21/2009  . H/O mechanical aortic valve replacement 01/21/2009  . HYPOKALEMIA 01/21/2009  . Altered mental status 01/21/2009    Resolved Hospital Problems  No resolved problems to display.    Discharge Condition: Subtherapeutic INR 1.88 in the setting of A-fib and mechanical ventilation  Diet recommendation: Will defer to hospice  Vitals:   07/01/17 2122 07/02/17 0544  BP: 123/67 (!) 157/71  Pulse: 97 69  Resp: 20 19  Temp: 98.2 F (36.8 C) 99.5 F (37.5 C)  SpO2: 96% 100%    History of present illness:  [name] is a 83 y.o. male with medical history significant of coronary artery disease, aortic valve replacement on warfarin, coronary artery bypass graft, atrial fibrillation, benign prostatic hypertrophy, renal cell carcinoma (diagnosed in 2010 with recurrence in 2013 status post radiofrequency ablation), progressive dementia with behavioral disturbance (he was hospitalized at  [facility] this year for stabilization and management of medications due to aggressive behavior, since that time he has been much more stable with no outbursts) who presents emergency department with complaints of left facial droop, falls at home, slurred speech, and multiple ecchymoses.  Patient has had slurred speech, confusion, and left-sided facial droop. Last known well was 5 PM yesterday. Patient was found unresponsive at home today by family. He was in his bed. Apparently he coughed and became more alert.  But remains very confused and agitated.  He is often agitated when he misses his usual daily medications.  He is unable to give me any accurate history.  He believes that the president is [name], he does not know the year, he thinks the month is September.  He does know he is in [place].  He does know he is in the hospital.  Per family, patient has been more lethargic the past 2 days. Decreased appetite. He fell and may have hit his head 5 days ago. Pt's wife could not wake him this morning and he didn't move much during the night. When kids arrived, he would open his eyes.  He then did open his eyes an MRI of the brain was obtained and showed no acute stroke.  It did show moderate temporal volume loss which appears to be increased compared to prior in February 2018.  Mild to moderate for age periventricular chronic microvascular ischemic change. The patient is followed by Dr. Rexene Alberts (family wishes to change MD)  from neurology as well as Dr. Roland Earl from psychiatry.  His care has become more complicated as his dementia has worsened and  he has evidence of acute delirium today.  He is truly unable to provide any history due to his significant dementia.  All of the history is obtained from the family.  They do not know if he has had strong scented urine as I do not provide his personal care that is provided by his wife and a care aide at home.  At the present time her urinalysis is pending.   Patient will be admitted to the hospital for further evaluation and management of acute delirium.   Advanced dementia with restlessness which waxes and wanes. Not aggressive.  No sign of infective process. INR initially supratherapeutic then subtherapeutic. Pharmacy consulted to bridge coumadin with heparin drip.  On the day of discharge INR 1.88. Daughter requested to stop treatment and discharge home with hospice.   Hospital Course:  Principal Problem:   Delirium Active Problems:   HYPOKALEMIA   Atrial fibrillation (HCC)   Altered mental status   H/O mechanical aortic valve replacement   Aortic valve disorder   Dementia with behavioral disturbance   Fall   AKI (acute kidney injury) ([place])   Warfarin poisoning   Warfarin-induced coagulopathy ([place])   Palliative care by specialist   Procedures:  None  Consultations:  Pharmacy  Discharge Exam: BP (!) 157/71 (BP Location: Left Arm)   Pulse 69   Temp 99.5 F (37.5 C) (Oral)   Resp 19   Wt 103.4 kg (228 lb)   SpO2 100%   BMI 27.75 kg/m   General: 83 yo CM WD WN in NAD Cardiovascular: IRRR with no murmurs rubs or gallops Respiratory: CTA with no wheezes or rales or ronchi  Discharge Instructions You were cared for by a hospitalist during your hospital stay. If you have any questions about your discharge medications or the care you received while you were in the hospital after you are discharged, you can call the unit and asked to speak with the hospitalist on call if the hospitalist that took care of you is not available. Once you are discharged, your primary care physician will handle any further medical issues. Please note that NO REFILLS for any discharge medications will be authorized once you are discharged, as it is imperative that you return to your primary care physician (or establish a relationship with a primary care physician if you do not have one) for your aftercare needs so that they can reassess your need  for medications and monitor your lab values.   Allergies as of 07/02/2017   No Known Allergies     Medication List    TAKE these medications   amLODipine 10 MG tablet Commonly known as:  NORVASC Take 1 tablet (10 mg total) by mouth daily.   aspirin 81 MG EC tablet Take 1 tablet (81 mg total) by mouth daily.   atorvastatin 40 MG tablet Commonly known as:  LIPITOR Take 40 mg by mouth daily.   carvedilol 6.25 MG tablet Commonly known as:  COREG Take 6.25 mg by mouth 2 (two) times daily with a meal.   cholecalciferol 1000 units tablet Commonly known as:  VITAMIN D Take 1,000 Units by mouth daily.   DIGOX 0.125 MG tablet Generic drug:  digoxin TAKE 1 TABLET(125 MCG) BY MOUTH DAILY   escitalopram 10 MG tablet Commonly known as:  LEXAPRO Take 10 mg by mouth daily.   ferrous sulfate 325 (65 FE) MG EC tablet Take 325 mg by mouth daily with breakfast.   LORazepam 1 MG tablet Commonly known  as:  ATIVAN Take 1 tablet (1 mg total) by mouth at bedtime.   nitroGLYCERIN 0.4 MG SL tablet Commonly known as:  NITROSTAT Place 1 tablet (0.4 mg total) under the tongue every 5 (five) minutes as needed. For chest pain   potassium chloride 8 MEQ tablet Commonly known as:  KLOR-CON TAKE 1 TABLET(8 MEQ) BY MOUTH DAILY   ramipril 10 MG capsule Commonly known as:  ALTACE Take 10 mg by mouth 2 (two) times daily.   risperiDONE 0.5 MG tablet Commonly known as:  RISPERDAL Take 1 tablet (0.5 mg total) by mouth 3 (three) times daily.   temazepam 15 MG capsule Commonly known as:  RESTORIL Take 1 capsule (15 mg total) by mouth at bedtime as needed for sleep. What changed:  how much to take   torsemide 10 MG tablet Commonly known as:  DEMADEX TAKE 1 TABLET BY MOUTH TWICE DAILY   traZODone 50 MG tablet Commonly known as:  DESYREL Take 1 tablet (50 mg total) by mouth at bedtime. May take 2 qhs if needed   vitamin B-12 1000 MCG tablet Commonly known as:  CYANOCOBALAMIN Take 1,000 mcg  by mouth daily.   warfarin 4 MG tablet Commonly known as:  COUMADIN Take 2 tablets (8 mg total) by mouth at bedtime. Continue home dose of 8mg , Needs INR check in 4-5days What changed:    how much to take  additional instructions      No Known Allergies Follow-up Information    [facility] Follow up.   Specialty:  Hospice and Palliative Medicine Why:  home with hospice Contact information: [place] [address] [phone]        Cari Caraway, MD Follow up.   Specialty:  Family Medicine Contact information: [place] [address] [phone]            The results of significant diagnostics from this hospitalization (including imaging, microbiology, ancillary and laboratory) are listed below for reference.    Significant Diagnostic Studies: Dg Chest 1 View  Result Date: 06/28/2017 CLINICAL DATA:  83 year old male with altered mental status. EXAM: CHEST 1 VIEW COMPARISON:  03/11/2017 FINDINGS: The cardiomediastinal silhouette is enlarged but unchanged in size and configuration. Median sternotomy wires noted. There is minimal bibasilar atelectasis without focal opacity, sizable pleural effusion or pneumothorax. No acute osseous abnormalities. IMPRESSION: Stable cardiomegaly without acute intrathoracic process. Electronically Signed   By: Kristopher Oppenheim M.D.   On: 06/28/2017 09:10   Dg Elbow Complete Left  Result Date: 06/28/2017 CLINICAL DATA:  Acute left elbow pain following fall 2 days ago. Initial encounter. EXAM: LEFT ELBOW - COMPLETE 3+ VIEW COMPARISON:  None. FINDINGS: No acute fracture, subluxation or dislocation identified. There is no evidence of joint effusion. Posterior and medial soft tissue swelling noted. IMPRESSION: Soft tissue swelling without acute bony abnormality. Electronically Signed   By: Margarette Canada M.D.   On: 06/28/2017 10:28   Ct Head Wo Contrast  Result Date: 06/28/2017 CLINICAL DATA:   Altered level of consciousness EXAM: CT HEAD WITHOUT CONTRAST TECHNIQUE: Contiguous axial images were obtained from the base of the skull through the vertex without intravenous contrast. COMPARISON:  MRI 10/12/2016 FINDINGS: Brain: There is atrophy and chronic small vessel disease changes. No acute intracranial abnormality. Specifically, no hemorrhage, hydrocephalus, mass lesion, acute infarction, or significant intracranial injury. Vascular: No hyperdense vessel or unexpected calcification. Skull: No acute calvarial abnormality. Sinuses/Orbits: Visualized paranasal sinuses and mastoids clear. Orbital soft tissues unremarkable. Other:  None IMPRESSION: No acute intracranial abnormality. Atrophy, chronic microvascular disease. Electronically Signed   By: Rolm Baptise M.D.   On: 06/28/2017 09:24   Mr Brain Wo Contrast  Result Date: 06/28/2017 CLINICAL DATA:  Altered level of consciousness.  Fall 2 days ago. EXAM: MRI HEAD WITHOUT CONTRAST TECHNIQUE: Multiplanar, multiecho pulse sequences of the brain and surrounding structures were obtained without intravenous contrast. COMPARISON:  10/12/2016. FINDINGS: Brain: No acute infarction or visible hemorrhage, hydrocephalus, extra-axial collection or mass lesion. Prominent cerebral atrophy with ventriculomegaly. Medial temporal volume loss is moderate. Mild to moderate for age periventricular chronic microvascular ischemic change. Vascular: Loss of flow void in the left sigmoid sinus is likely related to asymmetric slower flow. All images combined no dural venous sinus thrombosis suspected. Major arterial flow voids are preserved. Skull and upper cervical spine: No gross marrow lesion. Sinuses/Orbits: Bilateral cataract resection.  No acute finding. IMPRESSION: 1. No acute finding, including infarct. 2. Atrophy and chronic small vessel ischemia. 3. Motion degraded to the degree that pathology could be easily obscured. Electronically Signed   By: Monte Fantasia M.D.   On:  06/28/2017 12:54    Microbiology: No results found for this or any previous visit (from the past 240 hour(s)).   Labs: Basic Metabolic Panel: Recent Labs  Lab 06/28/17 0834 06/29/17 0834 06/30/17 0928 07/01/17 0529 07/02/17 0307  NA 143 138 139 138 137  K 3.3* 3.3* 3.5 3.3* 3.7  CL 106 103 105 105 104  CO2 31 28 26 25 28   GLUCOSE 118* 114* 165* 115* 138*  BUN 20 15 13 13 10   CREATININE 1.29* 0.97 0.95 0.98 1.03  CALCIUM 9.6 9.3 9.5 9.1 9.0   Liver Function Tests: Recent Labs  Lab 06/28/17 0834  AST 18  ALT 15*  ALKPHOS 92  BILITOT 1.2  PROT 6.3*  ALBUMIN 3.5   No results for input(s): LIPASE, AMYLASE in the last 168 hours. No results for input(s): AMMONIA in the last 168 hours. CBC: Recent Labs  Lab 06/28/17 0834 06/29/17 0834 06/30/17 0928 07/01/17 0529 07/02/17 0307  WBC 6.3 5.9 6.2 8.5 12.2*  NEUTROABS 3.7  --   --   --  9.5*  HGB 12.1* 12.1* 11.9* 11.3* 10.9*  HCT 36.4* 35.8* 34.3* 32.8* 31.8*  MCV 86.7 84.6 83.5 83.5 83.9  PLT 182 187 193 189 179   Cardiac Enzymes: No results for input(s): CKTOTAL, CKMB, CKMBINDEX, TROPONINI in the last 168 hours. BNP: BNP (last 3 results) No results for input(s): BNP in the last 8760 hours.  ProBNP (last 3 results) No results for input(s): PROBNP in the last 8760 hours.  CBG: Recent Labs  Lab 06/28/17 0851  GLUCAP 101*       Signed:  Kayleen Memos, MD [facility] 07/02/2017, 5:24 PM

## 2017-07-02 NOTE — Progress Notes (Signed)
Called by CCMD and informed of a 2.01 second pause.  Pt is asymptomatic on assessment.  Will continue to monitor.

## 2017-07-03 ENCOUNTER — Ambulatory Visit: Payer: PPO | Admitting: Neurology

## 2017-07-31 ENCOUNTER — Ambulatory Visit (HOSPITAL_COMMUNITY): Payer: Self-pay | Admitting: Psychiatry

## 2017-08-27 DEATH — deceased

## 2018-09-06 IMAGING — CR DG CHEST 2V
4 series · 4 of 4 positions shown · non-contrast
Comparison: 09/09/2016

CLINICAL DATA: Weakness

EXAM:
CHEST  2 VIEW

[x chest ap (1 of 2)]
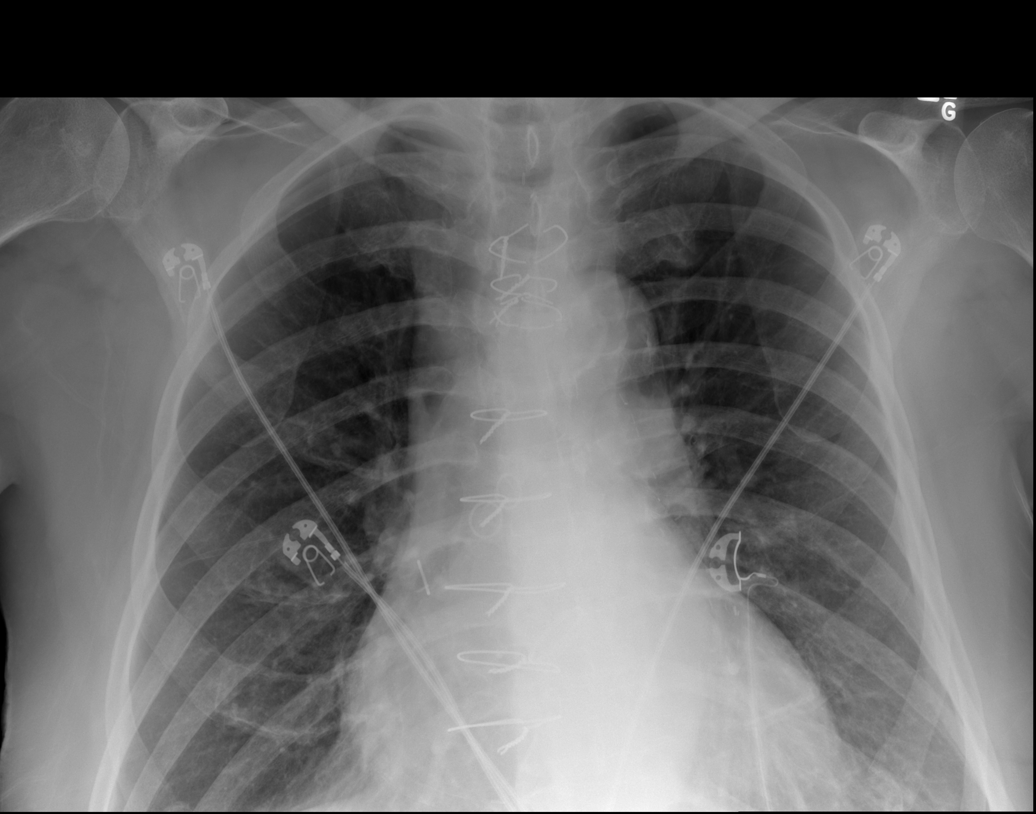

[x chest ap (2 of 2)]
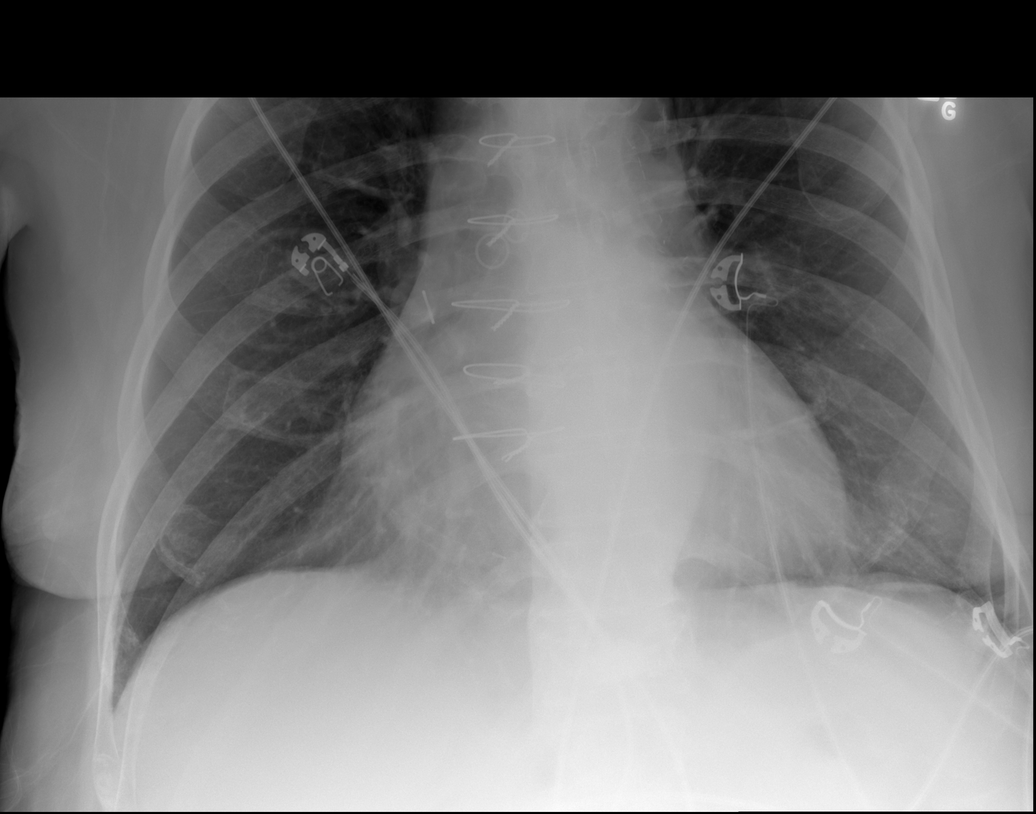

[w chest lat (1 of 2)]
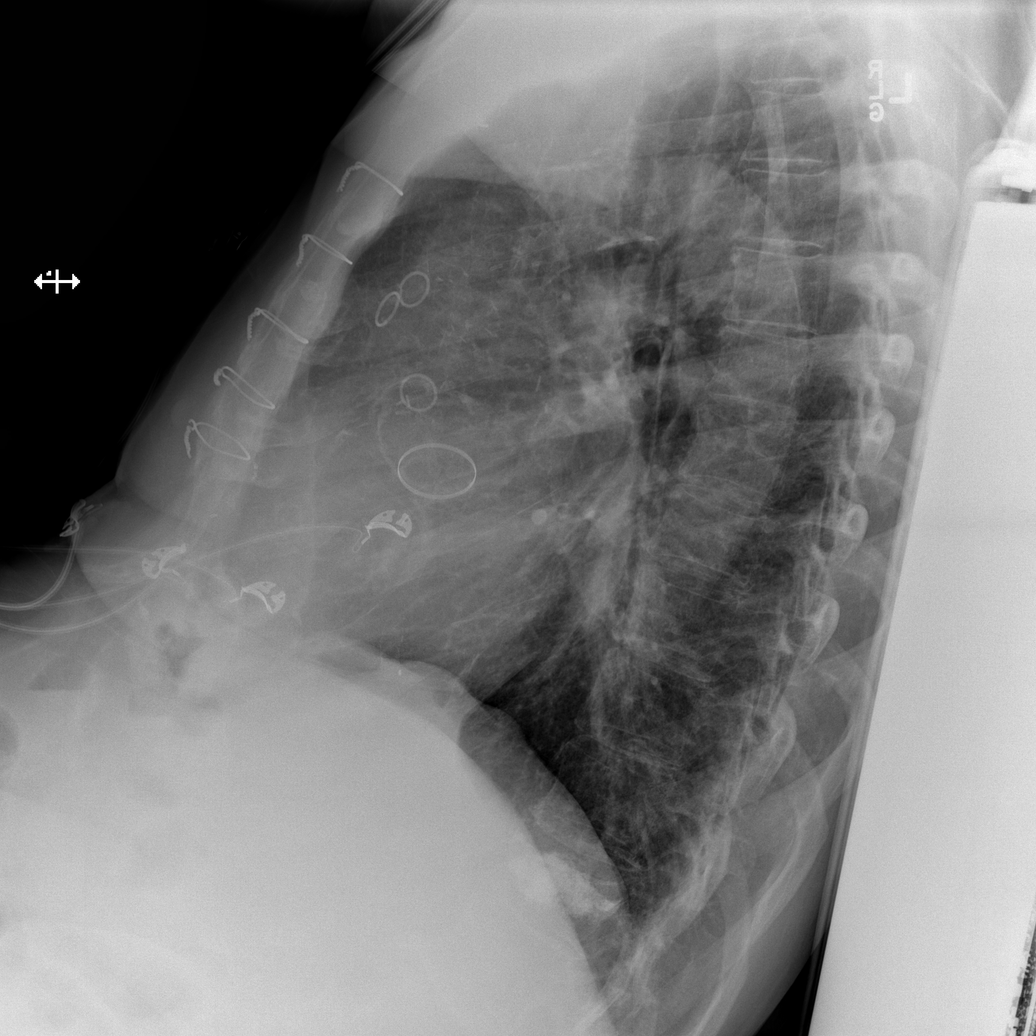

[w chest lat (2 of 2)]
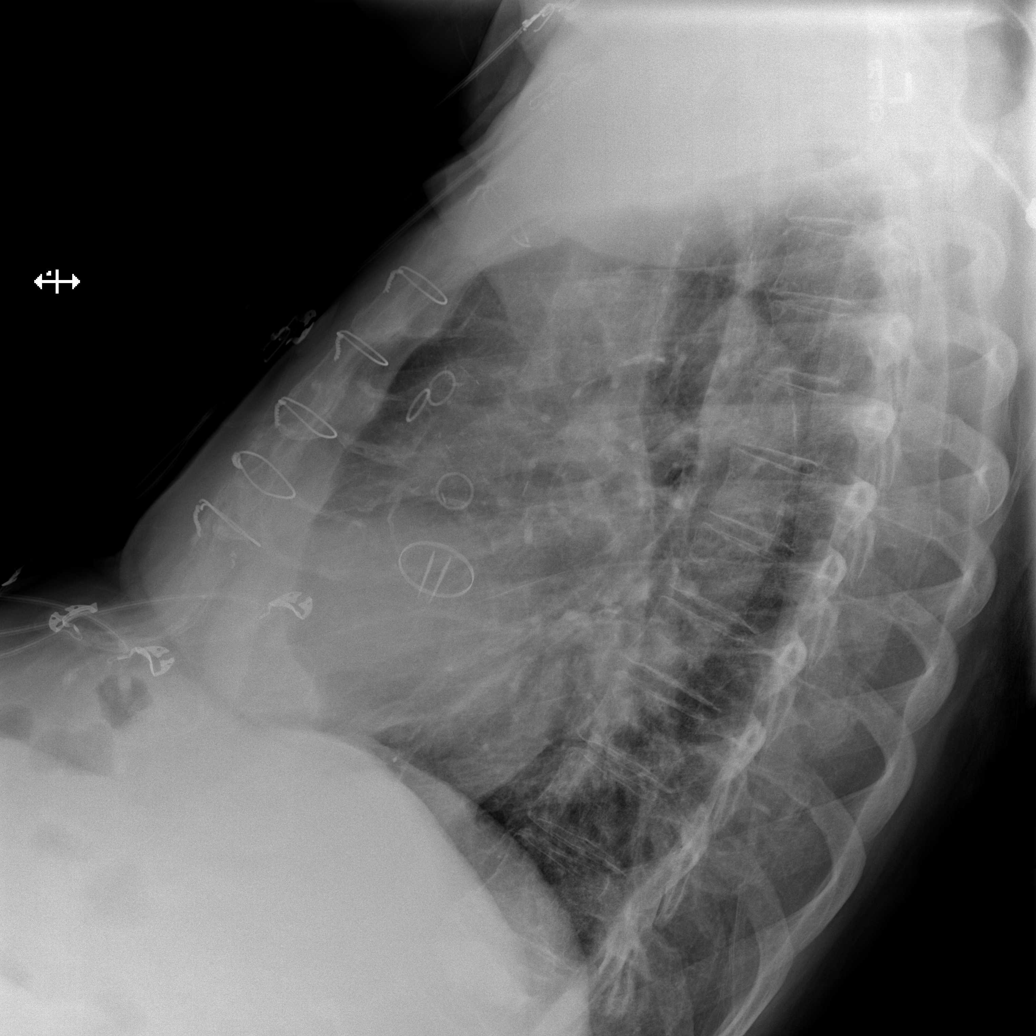

[4 of 4 positions shown; findings below may reference images not displayed]

FINDINGS: There is no focal parenchymal opacity. There is no pleural effusion
or pneumothorax. There is stable cardiomegaly. There is evidence of
prior CABG.

The osseous structures are unremarkable.
IMPRESSION: No active cardiopulmonary disease.

## 2018-10-02 ENCOUNTER — Telehealth: Payer: Self-pay | Admitting: *Deleted

## 2018-10-02 NOTE — Telephone Encounter (Signed)
error
